# Patient Record
Sex: Female | Born: 1945 | Hispanic: No | Marital: Married | Smoking: Never smoker
Health system: Southern US, Community
[De-identification: ages and names within clinical notes are randomized; demographics above are authoritative.]

## PROBLEM LIST (undated history)

## (undated) DIAGNOSIS — I10 Essential (primary) hypertension: Secondary | ICD-10-CM

## (undated) DIAGNOSIS — I4891 Unspecified atrial fibrillation: Secondary | ICD-10-CM

## (undated) DIAGNOSIS — K219 Gastro-esophageal reflux disease without esophagitis: Secondary | ICD-10-CM

## (undated) DIAGNOSIS — E7800 Pure hypercholesterolemia, unspecified: Secondary | ICD-10-CM

## (undated) DIAGNOSIS — I509 Heart failure, unspecified: Secondary | ICD-10-CM

## (undated) DIAGNOSIS — Z950 Presence of cardiac pacemaker: Secondary | ICD-10-CM

## (undated) DIAGNOSIS — E119 Type 2 diabetes mellitus without complications: Secondary | ICD-10-CM

## (undated) DIAGNOSIS — G8929 Other chronic pain: Secondary | ICD-10-CM

## (undated) DIAGNOSIS — E039 Hypothyroidism, unspecified: Secondary | ICD-10-CM

## (undated) DIAGNOSIS — I2510 Atherosclerotic heart disease of native coronary artery without angina pectoris: Secondary | ICD-10-CM

## (undated) HISTORY — DX: Heart failure, unspecified: I50.9

## (undated) HISTORY — PX: INSERT / REPLACE / REMOVE PACEMAKER: SUR710

## (undated) HISTORY — PX: BACK SURGERY: SHX140

## (undated) HISTORY — PX: CORONARY STENT PLACEMENT: SHX1402

---

## 1997-12-04 ENCOUNTER — Inpatient Hospital Stay (HOSPITAL_COMMUNITY): Admission: AD | Admit: 1997-12-04 | Discharge: 1997-12-09 | Payer: Self-pay | Admitting: Neurosurgery

## 1998-10-08 ENCOUNTER — Encounter: Payer: Self-pay | Admitting: Neurosurgery

## 1998-10-08 ENCOUNTER — Inpatient Hospital Stay (HOSPITAL_COMMUNITY): Admission: AD | Admit: 1998-10-08 | Discharge: 1998-10-10 | Payer: Self-pay | Admitting: Neurosurgery

## 2004-03-03 ENCOUNTER — Inpatient Hospital Stay: Payer: Self-pay | Admitting: Internal Medicine

## 2004-03-03 ENCOUNTER — Other Ambulatory Visit: Payer: Self-pay

## 2004-03-05 ENCOUNTER — Other Ambulatory Visit: Payer: Self-pay

## 2004-03-06 ENCOUNTER — Other Ambulatory Visit: Payer: Self-pay

## 2004-06-10 ENCOUNTER — Emergency Department: Payer: Self-pay | Admitting: Emergency Medicine

## 2004-10-14 ENCOUNTER — Emergency Department: Payer: Self-pay | Admitting: Emergency Medicine

## 2004-10-14 ENCOUNTER — Other Ambulatory Visit: Payer: Self-pay

## 2005-01-06 ENCOUNTER — Ambulatory Visit: Payer: Self-pay

## 2005-01-13 ENCOUNTER — Ambulatory Visit: Payer: Self-pay | Admitting: Family Medicine

## 2005-03-08 ENCOUNTER — Ambulatory Visit: Payer: Self-pay | Admitting: Internal Medicine

## 2005-03-12 ENCOUNTER — Ambulatory Visit: Payer: Self-pay | Admitting: Internal Medicine

## 2005-04-21 ENCOUNTER — Encounter: Payer: Self-pay | Admitting: Internal Medicine

## 2006-07-10 ENCOUNTER — Emergency Department: Payer: Self-pay | Admitting: Emergency Medicine

## 2007-01-13 ENCOUNTER — Ambulatory Visit: Payer: Self-pay

## 2007-07-10 ENCOUNTER — Ambulatory Visit: Payer: Self-pay | Admitting: Unknown Physician Specialty

## 2007-07-11 ENCOUNTER — Ambulatory Visit: Payer: Self-pay | Admitting: Unknown Physician Specialty

## 2007-10-18 ENCOUNTER — Ambulatory Visit: Payer: Self-pay | Admitting: Unknown Physician Specialty

## 2008-07-05 ENCOUNTER — Ambulatory Visit: Payer: Self-pay | Admitting: Unknown Physician Specialty

## 2008-09-26 ENCOUNTER — Ambulatory Visit: Payer: Self-pay | Admitting: Unknown Physician Specialty

## 2009-04-28 ENCOUNTER — Inpatient Hospital Stay: Payer: Self-pay | Admitting: Internal Medicine

## 2009-12-08 ENCOUNTER — Ambulatory Visit: Payer: Self-pay | Admitting: Ophthalmology

## 2010-03-02 ENCOUNTER — Ambulatory Visit: Payer: Self-pay | Admitting: Unknown Physician Specialty

## 2010-03-04 ENCOUNTER — Ambulatory Visit: Payer: Self-pay | Admitting: Ophthalmology

## 2010-12-05 ENCOUNTER — Emergency Department: Payer: Self-pay | Admitting: Emergency Medicine

## 2010-12-18 ENCOUNTER — Other Ambulatory Visit: Payer: Self-pay | Admitting: Internal Medicine

## 2010-12-21 ENCOUNTER — Ambulatory Visit: Payer: Self-pay | Admitting: Internal Medicine

## 2011-06-08 ENCOUNTER — Ambulatory Visit: Payer: Self-pay | Admitting: Unknown Physician Specialty

## 2012-06-12 ENCOUNTER — Ambulatory Visit: Payer: Self-pay | Admitting: Unknown Physician Specialty

## 2013-06-14 ENCOUNTER — Ambulatory Visit: Payer: Self-pay | Admitting: Physician Assistant

## 2013-08-06 ENCOUNTER — Observation Stay: Payer: Self-pay | Admitting: Internal Medicine

## 2013-08-06 LAB — CBC
HCT: 39.8 % (ref 35.0–47.0)
HGB: 13.1 g/dL (ref 12.0–16.0)
MCH: 28.7 pg (ref 26.0–34.0)
MCHC: 32.9 g/dL (ref 32.0–36.0)
MCV: 87 fL (ref 80–100)
Platelet: 323 10*3/uL (ref 150–440)
RBC: 4.56 10*6/uL (ref 3.80–5.20)
RDW: 13.7 % (ref 11.5–14.5)
WBC: 6.8 10*3/uL (ref 3.6–11.0)

## 2013-08-06 LAB — COMPREHENSIVE METABOLIC PANEL
Albumin: 3.8 g/dL (ref 3.4–5.0)
Alkaline Phosphatase: 85 U/L
Anion Gap: 8 (ref 7–16)
BUN: 11 mg/dL (ref 7–18)
Bilirubin,Total: 0.6 mg/dL (ref 0.2–1.0)
Calcium, Total: 9.1 mg/dL (ref 8.5–10.1)
Chloride: 102 mmol/L (ref 98–107)
Co2: 24 mmol/L (ref 21–32)
Creatinine: 0.87 mg/dL (ref 0.60–1.30)
EGFR (African American): >60
EGFR (Non-African Amer.): >60
Glucose: 137 mg/dL — ABNORMAL HIGH (ref 65–99)
Osmolality: 270 (ref 275–301)
Potassium: 3.8 mmol/L (ref 3.5–5.1)
SGOT(AST): 9 U/L — ABNORMAL LOW (ref 15–37)
SGPT (ALT): 16 U/L (ref 12–78)
Sodium: 134 mmol/L — ABNORMAL LOW (ref 136–145)
Total Protein: 7.6 g/dL (ref 6.4–8.2)

## 2013-08-06 LAB — CK TOTAL AND CKMB (NOT AT ARMC)
CK, Total: 156 U/L
CK-MB: 3.1 ng/mL (ref 0.5–3.6)

## 2013-08-06 LAB — TROPONIN I
Troponin-I: <0.02 ng/mL
Troponin-I: <0.02 ng/mL
Troponin-I: <0.02 ng/mL

## 2013-08-06 LAB — PRO B NATRIURETIC PEPTIDE: B-Type Natriuretic Peptide: 43 pg/mL (ref 0–125)

## 2013-08-06 LAB — APTT: Activated PTT: 27.9 secs (ref 23.6–35.9)

## 2013-08-07 ENCOUNTER — Ambulatory Visit: Payer: Self-pay | Admitting: Neurology

## 2013-08-07 LAB — BASIC METABOLIC PANEL
Anion Gap: 6 — ABNORMAL LOW (ref 7–16)
BUN: 12 mg/dL (ref 7–18)
Calcium, Total: 8.4 mg/dL — ABNORMAL LOW (ref 8.5–10.1)
Chloride: 106 mmol/L (ref 98–107)
Co2: 24 mmol/L (ref 21–32)
Creatinine: 0.71 mg/dL (ref 0.60–1.30)
EGFR (African American): >60
EGFR (Non-African Amer.): >60
Glucose: 138 mg/dL — ABNORMAL HIGH (ref 65–99)
Osmolality: 274 (ref 275–301)
Potassium: 3.7 mmol/L (ref 3.5–5.1)
Sodium: 136 mmol/L (ref 136–145)

## 2013-08-07 LAB — CBC
HCT: 36.4 % (ref 35.0–47.0)
HGB: 12.2 g/dL (ref 12.0–16.0)
MCH: 29.1 pg (ref 26.0–34.0)
MCHC: 33.5 g/dL (ref 32.0–36.0)
MCV: 87 fL (ref 80–100)
Platelet: 271 10*3/uL (ref 150–440)
RBC: 4.19 10*6/uL (ref 3.80–5.20)
RDW: 13.8 % (ref 11.5–14.5)
WBC: 6.3 10*3/uL (ref 3.6–11.0)

## 2014-05-13 ENCOUNTER — Ambulatory Visit: Payer: Self-pay

## 2014-08-16 ENCOUNTER — Observation Stay
Admit: 2014-08-16 | Disposition: A | Discharge status: Home / Self Care | Payer: Self-pay | Attending: Internal Medicine | Admitting: Internal Medicine

## 2014-08-16 LAB — BASIC METABOLIC PANEL
ANION GAP: 11 (ref 7–16)
BUN: 13 mg/dL
CALCIUM: 9.6 mg/dL
CREATININE: 0.76 mg/dL
Chloride: 103 mmol/L
Co2: 27 mmol/L
Glucose: 117 mg/dL — ABNORMAL HIGH
Potassium: 3.6 mmol/L
SODIUM: 141 mmol/L

## 2014-08-16 LAB — URINALYSIS, COMPLETE
Bacteria: NONE SEEN
Bilirubin,UR: NEGATIVE
Glucose,UR: NEGATIVE mg/dL (ref 0–75)
Ketone: NEGATIVE
Leukocyte Esterase: NEGATIVE
Nitrite: NEGATIVE
PH: 7 (ref 4.5–8.0)
Protein: NEGATIVE
RBC,UR: <1 /HPF (ref 0–5)
Specific Gravity: 1.004 (ref 1.003–1.030)
Squamous Epithelial: <1
WBC UR: <1 /HPF (ref 0–5)

## 2014-08-16 LAB — CBC
HCT: 42.5 % (ref 35.0–47.0)
HGB: 13.9 g/dL (ref 12.0–16.0)
MCH: 30.1 pg (ref 26.0–34.0)
MCHC: 32.6 g/dL (ref 32.0–36.0)
MCV: 92 fL (ref 80–100)
Platelet: 338 10*3/uL (ref 150–440)
RBC: 4.6 10*6/uL (ref 3.80–5.20)
RDW: 13.4 % (ref 11.5–14.5)
WBC: 8.7 10*3/uL (ref 3.6–11.0)

## 2014-08-16 LAB — TROPONIN I
Troponin-I: <0.03 ng/mL
Troponin-I: <0.03 ng/mL

## 2014-08-27 ENCOUNTER — Ambulatory Visit
Admit: 2014-08-27 | Disposition: A | Discharge status: Home / Self Care | Payer: Self-pay | Attending: Physician Assistant | Admitting: Physician Assistant

## 2014-09-07 NOTE — Consult Note (Signed)
PATIENT NAME:  Christine BestANNA, Christine Zimmerman MR#:  [id number] DATE OF BIRTH:  Jan 25, 1946  DATE OF CONSULTATION:  08/06/2013  REFERRING PHYSICIAN:  Emergency Room  CONSULTING PHYSICIAN:  Aubrielle Stroud D. Delight Bickle, MD  REASON FOR CONSULTATION: Referred by the Emergency Room with unstable chest pain.   HISTORY OF PRESENT ILLNESS: The patient is a 69 year old [place] from [place] with a history of coronary artery disease, angioplasty, stenting, diabetes, hypertension, hyperlipidemia, DJD, who recently complained of significant pain in the left arm, shoulder, back off and on for several days. She initially came to my office but was having active chest pain and I was not present, so she was advised to go the Emergency Room for further evaluation and care. EKG was nondiagnostic, but she continued to have significant pain, so she was admitted for further evaluation and care. Pain was 5 out of 10 and relatively persistent over several days.   REVIEW OF SYSTEMS: No blackout spells or syncope. No nausea or vomiting. No fever. No chills. No sweats. No weight loss. No weight gain. No hemoptysis or hematemesis. No bright red blood per rectum. No vision change or hearing change. No sputum production or cough.   PAST MEDICAL HISTORY: Coronary artery disease, DJD, hyperlipidemia, hypertension, diabetes, hypothyroidism, mild depression, anxiety.   PAST SURGICAL HISTORY: Coronary artery bypass surgery, PCI and stent, back surgery.   FAMILY HISTORY: Coronary artery disease, hypertension, diabetes, cancer.   SOCIAL HISTORY: Married. Lives with husband. Denies smoking or alcohol consumption.   MEDICATIONS: She is on Synthroid 50 mcg a day, ranitidine 150 twice a day, Plavix 75 a day, nitroglycerin 0.4 p.r.n., Niaspan once a day, Imdur twice a day, HCTZ 25 a day, Glucophage 1000 twice a day, glipizide 10 mg a day, calcium plus D, atorvastatin 40 mg a day, aspirin 81 a day.   PHYSICAL EXAMINATION: VITAL SIGNS: Blood pressure was  190/80, pulse 70, respiratory rate 16, afebrile.  HEENT: Normocephalic, atraumatic. Pupils equal and reactive to light.  NECK: Supple. No significant JVD, bruits, or adenopathy.  LUNGS: Clear to auscultation and percussion. No significant wheeze, rhonchi, or rale.  HEART: Regular rate and rhythm. Systolic ejection murmur at the  left sternal border. Positive S4.  ABDOMEN: Benign.  EXTREMITIES: Within normal limits.  NEUROLOGIC: Intact.  SKIN: Normal.   LABORATORY, DIAGNOSTIC, AND RADIOLOGICAL DATA: Glucose 137, BNP 43, BUN 11, creatinine 0.87, sodium 134, potassium 3.8, chloride 102, CO2 of 24, calcium 9.1. LFTs negative. White count 6.8, hemoglobin 13, platelet count 323. Chest x-ray negative. EKG: Right bundle branch block, nonspecific ST-T wave changes.   ASSESSMENT: Possible unstable angina, chest pain, coronary artery disease, diabetes, hyperlipidemia, hypertension, anxiety, degenerative joint disease.   PLAN:  1.  Agree with admit. Rule out for myocardial infarction. Consider cardiac cath for unstable anginal symptoms. Anticoagulation, aspirin, beta blockers. Also recommend nitrates. Would consider Ranexa. Will base recommendations on the cath.  2.  Continue hypertension control.  3.  Continue diabetes management and control.  4.  Continue statin therapy.  5.  Continue DJD therapy.  6.  Consider reflux therapy.   ____________________________ Bobbie Stackwayne D. Juliann Paresallwood, MD [email or web address] D: 08/07/2013 13:14:47 ET T: 08/07/2013 13:36:34 ET JOB#: [id number]  cc: Annaliz Aven D. Juliann Paresallwood, MD, <Dictator> Alwyn PeaWAYNE D Delecia Vastine MD ELECTRONICALLY SIGNED 08/28/2013 12:39

## 2014-09-07 NOTE — Discharge Summary (Signed)
PATIENT NAME:  Christine Zimmerman, Christine Zimmerman MR#:  [id number] DATE OF BIRTH:  1945-07-01  DATE OF ADMISSION:  08/06/2013 DATE OF DISCHARGE:  08/08/2013  FINAL DIAGNOSES: 1.  Left neck, shoulder, arm discomfort, likely due to radiculopathy from cervical disk disease.  2.  Lumbar degenerative disk disease with sciatica, likely source of left leg numbness.  3.  Coronary artery disease with prior stenting.  4.  Hyperlipidemia.  5.  Hypertension.  6.  Onset diabetes mellitus.  7.  Hypothyroidism.  PRINCIPAL PROCEDURE:  Cardiac catheterization. This revealed intact stents without significant flow-limiting disease. The ejection fraction was preserved.   HISTORY AND PHYSICAL: Please see dictated admission history and physical.  HOSPITAL COURSE: The patient was admitted with discomfort and pain in the left neck, chest, arm, but also with numbness in the left hand. This appeared to be positional and her story appeared to be consistent with cervical disk disease. She also complained of some numbness in the left foot. On further addressing with her, this had been a longer standing problem and these 2 issues appeared to be separate. She underwent MRI of the brain which revealed no evidence of stroke. Carotid Dopplers were performed, which revealed no evidence of flow-limiting disease. Cervical spine films were performed, which revealed diffuse degenerative disk disease. Neurology consultation was obtained. The patient appeared to have chronic findings related to sciatic nerve impingement within the leg, as well as decrease strength within the triceps and changes in the reflexes, which would suggest a portion of subacute cervical disk disease, nerve impingement as well.   Because of history of coronary artery disease and the continued discomfort in the chest, some of which the patient stated were similar to the pain she had prior stenting, cardiology consultation was obtained, and catheterization was originally planned for  03/24. Because of the Emergency Room schedule, however, this could not be performed. She was placed on prednisone taper, muscle relaxants and heat was used. This showed some improvement in her symptoms, though they did not resolve. To further investigate, she did undergo cardiac catheterization on 08/08/2013, with the results as noted above.  At this point it is felt that these symptoms are related to a cervical disk disease. We had recommended physical therapy which she did not yet want to set up. The muscle relaxers did not provide much relief, and made her a little bit sleepy, so these will be held. She is tolerating the prednisone taper and she is aware of potential for rising blood sugars in relation to this medication, though this was not observed to be a significant problem during her stay in the hospital.  At this time, she will be discharged home in stable condition with physical activity up as tolerated. She will follow up with Dr. Dorothyann Peng in 1 to 2 weeks and follow up with Lora Paula, PA at [facility], in the next 1 to 2 weeks as well. She should follow a 2 gram sodium, carbohydrate controlled diet. She should check her sugars daily and record this. Red flag symptoms were discussed with the patient. Care for the wound site in the groin was discussed with the patient by nursing.   DISCHARGE MEDICATIONS: 1.  Atorvastatin 40 mg p.o. at bedtime.  2.  Ranitidine 150 mg p.o. b.i.d.  3.  HCTZ 25 mg p.o. daily with Synthroid 0.05 mg p.o. daily.  4.  Glucophage 1000 mg p.o. b.i.d. 5.  Calcium plus D 600 mg p.o. daily.  6.  Aspirin 81  mg p.o. daily. 7.  Plavix 75 mg p.o. daily.  8.  Niaspan ER 500 mg p.o. daily.  9.  Isosorbide mononitrate 30 mg p.o. b.i.d.  10.  Nitroglycerin 0.4 mg sublingually q. 5 minutes as needed for chest pain; max 3 times and in 24 hours.  11.  Glipizide 10 mg p.o. daily. 12.  Prednisone taper starting at 30 mg daily, decreasing x 10 mg  every 2 days.   ____________________________ Lynnea FerrierBert J. Murriel Eidem III, MD [email or web address] D: 08/08/2013 12:55:27 ET T: 08/08/2013 13:08:29 ET JOB#: [id number]  cc: Lynnea FerrierBert J. Marlyce Mcdougald III, MD, <Dictator> Dwayne D. Juliann Paresallwood, MD Maurine MinisterMiriam McLaughlin, PA-C Daniel NonesBERT Vernita Tague MD ELECTRONICALLY SIGNED 08/09/2013 8:03

## 2014-09-07 NOTE — Consult Note (Signed)
Chief Complaint:  Subjective/Chief Complaint Pre-op for cath today. Still having some cp. She was unable to have cath yesterday because of the full schedule.   VITAL SIGNS/ANCILLARY NOTES: **Vital Signs.:   25-Mar-15 07:39  Pulse Pulse 55  Respirations Respirations 18  Systolic BP Systolic BP 729  Diastolic BP (mmHg) Diastolic BP (mmHg) 77  Mean BP 101  Pulse Ox % Pulse Ox % 97  Pulse Ox Activity Level  At rest  Oxygen Delivery Room Air/ 21 %  *Intake and Output.:   Shift 25-Mar-15 07:00  Grand Totals Intake:   Output:  400    Net:  -400 24 Hr.:  -1900  Urine ml     Out:  400  Length of Stay Totals Intake:  1049 Output:  2850    Net:  -1801   Brief Assessment:  GEN well developed, well nourished, no acute distress   Cardiac Regular   Respiratory normal resp effort  clear BS   Gastrointestinal Normal   Gastrointestinal details normal Soft  Nontender  Nondistended   EXTR negative cyanosis/clubbing, negative edema   Lab Results: Cardiac Catherization:  25-Mar-15 09:25   Cardiac Catheterization  [facility] [place], [place] [address] [phone]   Cardiovascular Catheterization Comprehensive Report   Patient: Christine Zimmerman Study date: 08/08/2013 MR number: [id number] Account number: [id number]   DOB: 12-11-1945 Age: 69 years Gender: Female Race: Oriental Height: 61.8 in Weight: 159.7 lb   Diagnostic Cardiologist:  Lujean Amel, MD   SUMMARY:   -Summary: Normal LVF EF=55% Constance Holster; Wall Motion Cors [place] ok LAD 60-70% diffuse 50% distal Circ 25-30% diffuse stents patent RCA 25% stent patent IMP' Mild/Mod CAD Rec Medical therapy She may need possible intervention of prox LAD   CORONARY CIRCULATION: The coronary circulation is right dominant. Proximal LAD: There was a diffuse 60 % stenosis. Mid LAD: There was a 50 % stenosis. Proximal circumflex: There was a diffuse 25 % stenosis at the site of a prior stent. Mid  circumflex: There was a diffuse 25 % stenosis at the site of a prior stent. Proximal RCA: There was a diffuse 25 % stenosis at the site of a prior stent.   VENTRICLES: There were no left ventricular global or regional wall motion abnormalities. The left ventricle was normal in size.   VALVES: AORTIC VALVE: The aortic valve was evaluated by left ventriculography. The aortic valve appeared to be structurally normal. The aortic valve leaflets exhibited normal thickness and normal excursion. There was no aortic stenosis. MITRAL VALVE: The mitral valve was evaluated by left ventriculography. The mitral valve appeared grossly normal. The mitral leaflets exhibited normal thickness and normal excursion. The mitral valve exhibited no regurgitation.   INDICATIONS: Angina/MI: atypical chest pain.   HISTORY: No history of previous myocardial infarction. There was no prior diagnosis of congestive heart failure. The patient has hypertension, oral hypoglycemic-treated diabetes, and a family history of coronary artery disease. There was no history of cerebrovascular disease, peripheral arterial disease, or chronic lung disease. PRIOR CARDIOVASCULAR PROCEDURES: No history of valve surgery or coronary bypass surgery.   PRIOR DIAGNOSTIC TEST RESULTS: Nuclear stress test was negative.   PROCEDURES PERFORMED: Left heart catheterization with ventriculography. Procedure: Arterial Occlusive Device Procedure: Successful Closure with Mynx   COMPLICATIONS: No complication occurred during the cath lab visit.   PROCEDURE: The risks and alternatives of the procedures and conscious sedation were explained to the patient and informed consent was obtained. The patient was brought to the  cath lab and placed on the table. The planned puncture sites were prepped and draped in the usual sterile fashion.   -Right femoral artery access. The vessel was accessed, a wire was threaded into the vessel, and a was  advanced over the wire into the vessel.   -Left heart catheterization. A catheter was advanced to the ascending aorta. Ventriculography was performed using power injectionof contrast agent.   -Arterial Occlusive Device.   -Successful Closure with Mynx.   PROCEDURE COMPLETION: TIMING: Test started at 10:02. Test concluded at 10:26. RADIATION EXPOSURE: Fluoroscopy time: 1.85 min. Fluoroscopy dose: 0.503 Gray. MEDICATIONS GIVEN: Midazolam, 1 mg, IV, at 09:58. Fentanyl, 25 mcg, IV, at 10:10. CONTRAST GIVEN: Isovue 90 ml.   Prepared and signed by   Lujean Amel, MD Signed 08/15/2013 14:27:07   STUDY DIAGRAM   Angiographic findings Native coronary lesions:  Proximal LAD: Lesion 1: diffuse, 60 % stenosis.  Mid LAD: Lesion 1: 50 % stenosis. Proximal circumflex: Lesion 1: diffuse, 25 % stenosis, site of prior stent. Mid circumflex: Lesion 1: diffuse, 25 % stenosis, site of prior stent.  Proximal RCA: Lesion 1: diffuse, 25 % stenosis, site of prior stent.   HEMODYNAMIC TABLES   Pressures:  Baseline Pressures:  - HR: 67 Pressures:  - Rhythm: Pressures:  -- Aortic Pressure (S/D/M): 184/71/73 Pressures:  -- Left Ventricle (s/edp): 180/21/--   Outputs:  Baseline Outputs:  -- CALCULATIONS[age] Outputs:  -- CALCULATIONS: Body Surface Area: 1.73 Outputs:  -- CALCULATIONS: Height in cm: 157.00 Outputs:  -- CALCULATIONS: Sex: Female Outputs:  -- CALCULATIONS:Weight in kg: 72.60  Routine Chem:  24-Mar-15 05:12   Glucose, Serum  138  BUN 12  Creatinine (comp) 0.71  Sodium, Serum 136  Potassium, Serum 3.7  Chloride, Serum 106  CO2, Serum 24  Calcium (Total), Serum  8.4  Anion Gap  6  Osmolality (calc) 274  eGFR (African American) >60  eGFR (Non-African American) >60 (eGFR values <41m/min/1.73 m2 may be an indication of chronic kidney disease (CKD). Calculated eGFR is useful in patients with stable renal function. The eGFR calculation will not be reliable  in acutely ill patients when serum creatinine is changing rapidly. It is not useful in  patients on dialysis. The eGFR calculation may not be applicable to patients at the low and high extremes of body sizes, pregnant women, and vegetarians.)  Routine Hem:  24-Mar-15 05:12   WBC (CBC) 6.3  RBC (CBC) 4.19  Hemoglobin (CBC) 12.2  Hematocrit (CBC) 36.4  Platelet Count (CBC) 271 (Result(s) reported on 07 Aug 2013 at 05:36AM.)  MCV 87  MCH 29.1  MCHC 33.5  RDW 13.8   Radiology Results: XRay:    23-Mar-15 09:08, Chest PA and Lateral  Chest PA and Lateral   REASON FOR EXAM:    Chest Pain  COMMENTS:       PROCEDURE: DXR - DXR CHEST PA (OR AP) AND LATERAL  - Aug 06 2013  9:08AM     CLINICAL DATA:  Chest pain    EXAM:  CHEST  2 VIEW    COMPARISON:  DG CHEST 2V dated 12/05/2010    FINDINGS:  The heart size and mediastinal contours are within normal limits.  Both lungs are clear. The visualized skeletal structures are  unremarkable.     IMPRESSION:  No active cardiopulmonary disease.      Electronically Signed    By: HMargaree MackintoshM.D.    On: 08/06/2013 09:17  Verified By: Mikki Santee, M.D., MD    24-Mar-15 10:02, Cervical Spine AP and Lateral  Cervical Spine AP and Lateral   REASON FOR EXAM:    neck pain  COMMENTS:       PROCEDURE: DXR - DXR C- SPINE AP AND LATERAL  - Aug 07 2013 10:02AM     CLINICAL DATA:  Neck pain.    EXAM:  CERVICAL SPINE - 2-3 VIEW    COMPARISON:  MRI scan of July 10, 2007.    FINDINGS:  No fracture or spondylolisthesis is noted. Mild degenerative disc  disease is noted at C5-6 and C6-7. Mild anterior osteophyte  formation is noted at C3-4, C4-5 and C5-6. Posterior facet joints  appear normal.     IMPRESSION:  Multilevel degenerative disc disease. No acute abnormality seen in  the cervical spine.      Electronically Signed    By: Sabino Dick M.D.    On: 08/07/2013 12:17         Verified By: Marveen Reeks,  M.D.,  [place]:    23-Mar-15 14:21, US Carotid Doppler Bilateral  US Carotid Doppler Bilateral   REASON FOR EXAM:    TIA  COMMENTS:       PROCEDURE: [place]  - US CAROTID DOPPLER BILATERAL  - Aug 06 2013  2:21PM     CLINICAL DATA:  TIA, history of hypertension, left-sided visual  disturbance, hyperlipidemia, diabetes, history of CAD (post coronary  artery stent placement)    EXAM:  BILATERAL CAROTID DUPLEX ULTRASOUND    TECHNIQUE:  Pearline Cables scale imaging, color Doppler and duplex ultrasound were  performed of bilateral carotid and vertebral arteries in the neck.  COMPARISON:  None.    FINDINGS:  Criteria: Quantification of carotid stenosis is based on velocity  parameters that correlate the residual internal carotid diameter  with NASCET-based stenosis levels, using the diameter of the distal  internal carotid lumen as the denominator for stenosis measurement.    The following velocity measurements were obtained:    RIGHT    ICA:  63/20 cm/sec    CCA:  32/91 cm/sec  SYSTOLIC ICA/CCA RATIO:  9.16    DIASTOLIC ICA/CCA RATIO:  6.06    ECA:  61 cm/sec    LEFT    ICA:  82/29 cm/sec    CCA:  00/45 cm/sec    SYSTOLIC ICA/CCA RATIO:  0.9    DIASTOLIC ICA/CCA RATIO:  9.97  ECA:  89 cm/sec    RIGHT CAROTID ARTERY: There is marked tortuosity of the right common  carotid artery (image 5). There is a minimal amount of eccentric  intimal wall thickening within the mid and distal aspects of the  right common carotid arteries (representative images 7 and 10  respectively). There is minimal intimal thickening and eccentric  mixed echogenic plaque within the right carotid bulb (images 13 and  34). There is a minimal amount of eccentric mixed echogenic plaque  involving the right internal carotid artery (images 19, 22 and 26),  not resulting in elevated peak systolic velocities within the right  internal carotid artery to suggest a hemodynamically significant  stenosis.    RIGHT  VERTEBRAL ARTERY:  Antegrade flow  LEFT CAROTID ARTERY: There is a minimal amount of intimal thickening  within the distal aspect of the left common carotid artery (image  45). The left internal carotid artery is noted to be tortuous (image  58). There is a minimal amount of eccentric mixed echogenic plaque  within  the left carotid bulb (images 48 and 69) extending to involve  the origin proximal aspect of the left internal carotid artery  (image 54) not resulting inelevated peak systolic velocities within  the interrogated course of the left internal carotid artery.    LEFT VERTEBRAL ARTERY:  Antegrade flow     IMPRESSION:  Minimal amount of bilateral atherosclerotic plaque, right  subjectively greater than left, not resulting in a hemodynamically  significant stenosis.  Electronically Signed    By: Sandi Mariscal M.D.    On: 08/06/2013 14:26         Verified By: Aileen Fass, M.D.,  Cardiac Catherization:    25-Mar-15 09:25, Cardiac Catheterization  Cardiac Catheterization   [facility]  [place], [place] [address]  [phone]     Cardiovascular Catheterization Comprehensive Report     Patient: ELEONORA PEELER  Study date: 08/08/2013  MR number: [id number]  Account number: [id number]     DOB: [birth year]  Age: 73 years  Gender: Female  Race: Oriental  Height: 61.8 in  Weight: 159.7 lb     Diagnostic Cardiologist:  Lujean Amel, MD     SUMMARY:     -Summary: Normal LVF  EF=55%  Constance Holster; Wall Motion  Cors  [place] ok  LAD 60-70% diffuse 50% distal  Circ 25-30% diffuse stents patent  RCA 25% stent patent  IMP'  Mild/Mod CAD  Rec Medical therapy  She may need possible intervention of prox LAD     CORONARY CIRCULATION: The coronary circulation is right dominant.  Proximal LAD: There was a diffuse 60 % stenosis. Mid LAD: There was a  50 % stenosis. Proximal circumflex: There was a diffuse 25 % stenosis  at the site of a prior stent. Mid  circumflex: There was a diffuse 25  % stenosis at the site of a prior stent. Proximal RCA: There was a  diffuse 25 % stenosis at the site of a prior stent.     VENTRICLES: There were no left ventricular global or regional wall  motion abnormalities. The left ventricle was normal in size.     VALVES: AORTIC VALVE: The aortic valve was evaluated by left  ventriculography. The aortic valve appeared to be structurally  normal. The aortic valve leaflets exhibited normal thickness and  normal excursion. There was no aortic stenosis. MITRAL VALVE: The  mitral valve was evaluated by left ventriculography. The mitral valve  appeared grossly normal. The mitral leaflets exhibited normal  thickness and normal excursion. The mitral valve exhibited no  regurgitation.     INDICATIONS: Angina/MI: atypical chest pain.     HISTORY: No history of previous myocardial infarction. There was no  prior diagnosis of congestive heart failure. The patient has  hypertension, oral hypoglycemic-treated diabetes, and a family  history of coronary artery disease. There was no history of  cerebrovascular disease, peripheral arterial disease, or chronic lung  disease. PRIOR CARDIOVASCULAR PROCEDURES: No history of valve surgery  or coronary bypass surgery.     PRIOR DIAGNOSTIC TEST RESULTS: Nuclear stress test was negative.     PROCEDURES PERFORMED: Left heart catheterization with  ventriculography. Procedure: Arterial Occlusive Device Procedure:  Successful Closure with Mynx     COMPLICATIONS: No complication occurred during the cath lab visit.     PROCEDURE: The risks and alternatives of the procedures and conscious  sedation were explained to the patient and informed consent was  obtained. The patient was brought to the cath lab and  placed on the  table. The planned puncture sites were prepped and draped in the  usual sterile fashion.     -Right femoral artery access. The vessel was accessed, a wire  was  threaded into the vessel, and a was advanced over the wire into the  vessel.     -Left heart catheterization. A catheter was advanced to the ascending  aorta. Ventriculography was performed using power injectionof  contrast agent.     -Arterial Occlusive Device.     -Successful Closure with Mynx.     PROCEDURE COMPLETION: TIMING: Test started at 10:02. Test concluded at  10:26. RADIATION EXPOSURE: Fluoroscopy time: 1.85 min. Fluoroscopy  dose: 0.503 Gray.  MEDICATIONS GIVEN: Midazolam, 1 mg, IV, at 09:58. Fentanyl, 25 mcg,  IV, at 10:10.  CONTRAST GIVEN: Isovue 90 ml.     Prepared and signed by     Lujean Amel, MD  Signed 08/15/2013 14:27:07     STUDY DIAGRAM     Angiographic findings  Native coronary lesions:   Proximal LAD: Lesion 1: diffuse, 60 % stenosis.   Mid LAD: Lesion 1: 50 % stenosis.  Proximal circumflex: Lesion 1: diffuse, 25 % stenosis, site of prior  stent.  Mid circumflex: Lesion 1: diffuse, 25 % stenosis, site of prior  stent.   Proximal RCA: Lesion 1: diffuse, 25 % stenosis, site of prior stent.     HEMODYNAMIC TABLES     Pressures:  Baseline  Pressures:  - HR: 67  Pressures:  - Rhythm:  Pressures:  -- Aortic Pressure (S/D/M): 184/71/73  Pressures:  -- Left Ventricle (s/edp): 180/21/--     Outputs:  Baseline  Outputs:  -- CALCULATIONS[age]  Outputs:  -- CALCULATIONS: Body Surface Area: 1.73  Outputs:  -- CALCULATIONS: Height in cm: 157.00  Outputs:  -- CALCULATIONS: Sex: Female  Outputs:  -- CALCULATIONS:Weight in kg: 72.60  MRI:    23-Mar-15 16:46, MRI Brain Without Contrast  MRI Brain Without Contrast   REASON FOR EXAM:    numbness in arm? TIA?  COMMENTS:       PROCEDURE: MR  - MR BRAIN WO CONTRAST  - Aug 06 2013  4:46PM     CLINICAL DATA:  Headache with left jaw numbness and left arm  numbness.    EXAM:  MRI HEAD WITHOUT CONTRAST    TECHNIQUE:  Multiplanar, multiecho pulse sequences of the brain and  surrounding  structures were obtained without intravenous contrast.  COMPARISON:  US CAROTID DUPLEX BILAT dated 08/06/2013; MR HEAD WO/W  CM dated 07/05/2008    FINDINGS:  No evidence for acute infarction, hemorrhage, mass lesion,  hydrocephalus, or extra-axial fluid. Normal for age cerebral volume.  No white matter disease. Flow voids are maintained throughout the  carotid, basilar, and vertebral arteries. There are no areas of  chronic hemorrhage. Pituitary, pineal, and cerebellar tonsils  unremarkable. No upper cervical lesions. Visualized calvarium, skull  base, and upper cervical osseous structures unremarkable. Scalp and  extracranial soft tissues, orbits, sinuses, and mastoids show no  acute process. Compared with prior MR, a similar appearance is  noted.   IMPRESSION:  Unremarkable MRI brain.  No acute intracranial findings.      Electronically Signed    By: Rolla Flatten M.D.    On: 08/06/2013 17:04         Verified By: Staci Righter, M.D.,  Cardiology:    23-Mar-15 08:38, ECG  Ventricular Rate 63  Atrial Rate 63  P-R  Interval 152  QRS Duration 130  QT 442  QTc 452  P Axis 37  R Axis 50  T Axis 9  ECG interpretation   Normal sinus rhythm  Non-specific intra-ventricular conduction block  Nonspecific T wave abnormality  Abnormal ECG  When compared with ECG of 05-Dec-2010 16:25,  QRS duration has increased  Inverted T waves have replaced nonspecific T wave abnormality in Anterior leads  ----------unconfirmed----------  Confirmed by OVERREAD, NOT ([id number]), editor PEARSON, BARBARA ([id number]) on 08/07/2013 2:51:05 PM  ECG    Assessment/Plan:  Assessment/Plan:  Assessment IMP [place] Angina CAD DM HTN Hyperlipidemia GERD Abn EKG .   Plan PLAN Tele which in house to look for arrthymias NTG paste/ SL NTG prn ASA po daily 81 mg Continue Bp control Agree with DM control Cardiac cath today Lipid therapy If cath ok will d/c home today   Electronic  Signatures: Yolonda Kida (MD)  (Signed 12-May-15 10:57)  Authored: Chief Complaint, VITAL SIGNS/ANCILLARY NOTES, Brief Assessment, Lab Results, Radiology Results, Assessment/Plan   Last Updated: 12-May-15 10:57 by Yolonda Kida (MD)

## 2014-09-07 NOTE — Consult Note (Signed)
Referring Physician:  Vaughan Basta :   Primary Care Physician:  Vaughan Basta [facility], [address], [place], [place] [address], [place] [phone]  Reason for Consult: Admit Date: 06-Aug-2013  Chief Complaint: "My left leg sometimes wakes me up at night"  Reason for Consult: left arm and leg numbness   History of Present Illness: History of Present Illness:   Ms. Christine Zimmerman is a 69 yo right-handed woman with PMH notable for CAD s/p multiple stents, lumbar discectomy, HTN, and DM who presented to [facility] for evaluation of chest discomfort and left arm numbness. She relates several episodes of left arm numbness and tingling over the past few months, though these can occur independently of episodes of chest discomfort. More concerning to her from a neurological standpoint is intermittent left leg numbness and shooting pain accompanied by prickling parasthesias. These tend to occur more at night, sometimes waking her from sleep, though can also happen at other times and with various body positions and activities. The pain begins in her posterolateral thigh and radiates down her leg into her foot. It is sometimes relieved by turning over in bed or changing positions. She denies any bowel or bladder changes.  ROS:  General pain   HEENT no complaints   Lungs no complaints   Cardiac chest pain   GI no complaints   GU no complaints   Musculoskeletal no complaints   Extremities no complaints   Skin no complaints   Neuro numbness/tingling   Endocrine no complaints   Psych no complaints   Past Medical/Surgical Hx:  Hypercholesterolemia:   cva:   Herniated Disc:   Hypercholesterolemia:   Cardiac Disease:   HTN:   Diabetes:   Back Surgery:   Cardiac Stent X 5:   Home Medications: Medication Instructions Last Modified Date/Time  nitroglycerin 0.4 mg sublingual tablet 1 tab(s) sublingual every 5 minutes, As Needed - for Chest Pain 23-Mar-15 10:04   glipiZIDE 10 mg oral tablet 1 tab(s) orally once a day 23-Mar-15 10:04  Niaspan ER tablet, extended release 500 mg 1 tab(s) orally once a day (at bedtime) x 30 days  23-Mar-15 10:04  isosorbide mononitrate tablet, extended release 30 mg 1 tab(s)  2 times a day (with meals) 23-Mar-15 10:04  Plavix tablet 75 mg 1 tab(s) orally once a day x 30 days  23-Mar-15 10:04  aspirin tablet 81 mg 1 tab(s) orally once a day x 30 days  23-Mar-15 10:04  calcium-vitamin D tablet 600 mg-200 units 1 tab(s) orally once a day x 30 days  23-Mar-15 10:04  Glucophage tablet 1000 mg 1 tab(s) orally 2 times a day x 30 days  23-Mar-15 10:04  Synthroid tablet 50 mcg (0.05 mg) 1 tab(s) orally once a day x 30 days  23-Mar-15 10:04  hydrochlorothiazide tablet 25 mg 1 tab(s) orally once a day x 30 days  23-Mar-15 10:04  ranitidine capsule 150 mg 1 cap(s) orally 2 times a day x 30 days  23-Mar-15 10:04  atorvastatin tablet 40 mg 1 tab(s) orally once a day (at bedtime) x 30 days  23-Mar-15 10:04   [facility] Current Meds:  Sodium Chloride 0.9%, 1000 ml at 100 ml/hr  HePARin injection, 5000 unit(s), Subcutaneous, q8h  Indication: Anticoagulant, Monitor Anticoags per hospital protocol  Insulin SS -Novolog injection, Subcutaneous, FSBS before meals and at bedtime  0 units if FSBS 0-150     2 unit(s) if FSBS 151 - 200     4 unit(s) if FSBS 201 -  250     6 unit(s) if FSBS 251 - 300     8 unit(s) if FSBS 301 - 350     10 unit(s) if FSBS 351 - 400  Call MD if FSBS is greater than 400, [Waste Code: Black]  Aspirin Chewable, 81 mg Oral daily  - Indication: Pain/Fever/Thromboembolic Disorders/Post MI/Prophylaxis MI  glipiZIDE tablet, ( Glucotrol)  10 mg Oral ac/break  - Indication: Diabetes  Instructions:  Give 30 minutes before breakfast  atorvaSTATin tablet, 40 mg Oral daily  - Indication: Hypercholesterolemia  Clopidogrel tablet, 75 mg Oral daily  Instructions:  Initiate Bleeding Precautions Protocol  Hydrochlorothiazide  tablet, ( Esidrix)  25 mg Oral daily  - Indication: Edema/ Hypertension/ Diuresis/ CHF  metFORMIN tablet, ( Glucophage)  1000 mg Oral bid/wm  - Indication: Diabetes  Instructions:  Give with meals  Niacin CR tablet, ( Niaspan CR)  500 mg Oral at bedtime  - Indication: Hyperlipidemia/ Vitamin Supplement  Instructions:  DO NOT CRUSH  Ranitidine tablet, ( ZanTAC)  150 mg Oral q12h  - Indication: Hyperacidity  Calcium Carb 541m-Vit D 200unit tablet, 1 tablet(s) Oral daily with meal  Instructions:  Contains (5029melemental Calcium) in 125066malcium Carbonate + 200m37mtamin D  Levothyroxine tablet, ( Synthroid)  0.05 mg Oral q6am  - Indication: Thyroid Hormone Replacement  Nursing Saline Flush, 3 to 6 ml, IV push, Q1M PRN for IV Maintenance  Give A.M. Meds the day of Cardiac Cath, Administer Scheduled A.M Meds.  This includes Aspirin, Beta Blockers, and Ace Inhibitors if applicable.  If the patient is a diabetic, ask the physician for morning dose.  Influenza Virus Quadrivalent Vaccine injection, 0.5 ml, Intramuscular, GivenOnce  Indication: provide Active Immunity to Influenza Strains contained in Vaccine, ***The patient must have a temperature of 100.5 or less, anything greater the patient needs to be afebrile x 24 hours before administration***, **Latex Free**  Allergies:  No Known Allergies:   Social/Family History: Lives With: spouse; children  Social History: Denies tobacco, alcohol, or illicit drug use   Vital Signs: **Vital Signs.:   24-Mar-15 07:45  Vital Signs Type Routine  Temperature Temperature (F) 97.7  Celsius 36.5  Temperature Source oral  Pulse Pulse 53  Respirations Respirations 16  Systolic BP Systolic BP 145 518astolic BP (mmHg) Diastolic BP (mmHg) 80  Mean BP 101  Pulse Ox % Pulse Ox % 97  Pulse Ox Activity Level  At rest  Oxygen Delivery Room Air/ 21 %   EXAM: Well-developed, well-nourished, in NAD. No conjunctival injection or scleral edema.  Oropharynx clear. No carotid bruits auscultated. Normal S1, S2 and regular cardiac rhythm on exam. Lungs clear to auscultation bilaterally. Abdomen soft and nontender. Peripheral pulses palpated. No clubbing, cyanosis, or edema in extremities.  MENTAL STATUS: Alert and oriented to person, place, and time. Language fluent and appropriate. Cognition and memory conversationally intact. CRANIAL NERVES: Visual fields full to confrontation. PERRL. EOMI. Facial sensation intact. Facial muscles full and symmetric. Hearing intact to finger rub. Uvula midline with symmetric palatal elevation. Tongue midline without fasciculations. MOTOR: Normal bulk and tone. Left triceps strength 4+/5, otherwise strength 5/5 in deltoids, biceps, triceps, wrist flexors and extensors, and hand intrinsics bilaterally. Strength 5/5 in iliopsoas, glutes, hamstrings, quads, and tib ant bilaterally. Foot eversion and inversion is normal bilaterally. REFLEXES: Absent left triceps and left achilles reflexes. Otherwise 2+ in biceps, triceps, patella, and achilles bilaterally. Flexor plantar responses bilaterally. SENSORY: Moderately diminished to pinprick in the posterior left arm as  well as lateral thigh and leg. Otherwise intact to pinprick throughout without extinction to double simultaneous stimulation. COORDINATION: No ataxia or dysmetria on finger-nose or heel-shin maneuvers. GAIT: Not tested.  Lab Results: Routine Chem:  23-Mar-15 08:46   Glucose, Serum  137  BUN 11  Creatinine (comp) 0.87  Sodium, Serum  134  Potassium, Serum 3.8  Chloride, Serum 102  CO2, Serum 24  Calcium (Total), Serum 9.1  Anion Gap 8  Osmolality (calc) 270  eGFR (African American) >60  eGFR (Non-African American) >60 (eGFR values <60m/min/1.73 m2 may be an indication of chronic kidney disease (CKD). Calculated eGFR is useful in patients with stable renal function. The eGFR calculation will not be reliable in acutely ill patients when serum  creatinine is changing rapidly. It is not useful in  patients on dialysis. The eGFR calculation may not be applicable to patients at the low and high extremes of body sizes, pregnant women, and vegetarians.)  B-Type Natriuretic Peptide ([facility]) 43 (Result(s) reported on 06 Aug 2013 at 09:27AM.)  24-Mar-15 05:12   Glucose, Serum  138  BUN 12  Creatinine (comp) 0.71  Sodium, Serum 136  Potassium, Serum 3.7  Chloride, Serum 106  CO2, Serum 24  Calcium (Total), Serum  8.4  Anion Gap  6  Osmolality (calc) 274  eGFR (African American) >60  eGFR (Non-African American) >60 (eGFR values <669mmin/1.73 m2 may be an indication of chronic kidney disease (CKD). Calculated eGFR is useful in patients with stable renal function. The eGFR calculation will not be reliable in acutely ill patients when serum creatinine is changing rapidly. It is not useful in  patients on dialysis. The eGFR calculation may not be applicable to patients at the low and high extremes of body sizes, pregnant women, and vegetarians.)  Cardiac:  23-Mar-15 08:46   Troponin I < 0.02 (0.00-0.05 0.05 ng/mL or less: NEGATIVE  Repeat testing in 3-6 hrs  if clinically indicated. >0.05 ng/mL: POTENTIAL  MYOCARDIAL INJURY. Repeat  testing in 3-6 hrs if  clinically indicated. NOTE: An increase or decrease  of 30% or more on serial  testing suggests a  clinically important change)    14:27   Troponin I < 0.02 (0.00-0.05 0.05 ng/mL or less: NEGATIVE  Repeat testing in 3-6 hrs  if clinically indicated. >0.05 ng/mL: POTENTIAL  MYOCARDIAL INJURY. Repeat  testing in 3-6 hrs if  clinically indicated. NOTE: An increase or decrease  of 30% or more on serial  testing suggests a  clinically important change)    18:45   Troponin I < 0.02 (0.00-0.05 0.05 ng/mL or less: NEGATIVE  Repeat testing in 3-6 hrs  if clinically indicated. >0.05 ng/mL: POTENTIAL  MYOCARDIAL INJURY. Repeat  testing in 3-6 hrs if  clinically  indicated. NOTE: An increase or decrease  of 30% or more on serial  testing suggests a  clinically important change)   Radiology Results: [place]   23-Mar-15 14:21, [place] Doppler Bilateral  [place] Doppler Bilateral   REASON FOR EXAM:    TIA  COMMENTS:       PROCEDURE: [place]- [place] DOPPLER BILATERAL  - Aug 06 2013  2:21PM     CLINICAL DATA:  TIA, history of hypertension, left-sided visual  disturbance, hyperlipidemia, diabetes, history of CAD (post coronary  artery stent placement)    EXAM:  BILATERAL CAROTID DUPLEX ULTRASOUND    TECHNIQUE:  GrPearline Cablescale imaging, color Doppler and duplex ultrasound were  performed of bilateral carotid and vertebral  arteries in the neck.  COMPARISON:  None.    FINDINGS:  Criteria: Quantification of carotid stenosis is based on velocity  parameters that correlate the residual internal carotid diameter  with NASCET-based stenosis levels, using the diameter of the distal  internal carotid lumen as the denominator for stenosis measurement.    The following velocity measurements were obtained:    RIGHT    ICA:  63/20 cm/sec    CCA:  38/10 cm/sec  SYSTOLIC ICA/CCA RATIO:  1.75    DIASTOLIC ICA/CCA RATIO:  1.02    ECA:  61 cm/sec    LEFT    ICA:  82/29 cm/sec    CCA:  58/52 cm/sec    SYSTOLIC ICA/CCA RATIO:  0.9    DIASTOLIC ICA/CCA RATIO:  7.78  ECA:  89 cm/sec    RIGHT CAROTID ARTERY: There is marked tortuosity of the right common  carotid artery (image 5). There is a minimal amount of eccentric  intimal wall thickening within the mid and distal aspects of the  right common carotid arteries (representative images 7 and 10  respectively). There is minimal intimal thickening and eccentric  mixed echogenic plaque within the right carotid bulb (images 13 and  34). There is a minimal amount of eccentric mixed echogenic plaque  involving the right internal carotid artery (images 19, 22 and 26),  not resulting in elevated  peak systolic velocities within the right  internal carotid artery to suggest a hemodynamically significant  stenosis.    RIGHT VERTEBRAL ARTERY:  Antegrade flow  LEFT CAROTID ARTERY: There is a minimal amount of intimal thickening  within the distal aspect of the left common carotid artery (image  45). The left internal carotid artery is noted to be tortuous (image  58). There is a minimal amount of eccentric mixed echogenic plaque  within the left carotid bulb (images 48 and 69) extending to involve  the origin proximal aspect of the left internal carotid artery  (image 54) not resulting inelevated peak systolic velocities within  the interrogated course of the left internal carotid artery.    LEFT VERTEBRAL ARTERY:  Antegrade flow     IMPRESSION:  Minimal amount of bilateral atherosclerotic plaque, right  subjectively greater than left, not resulting in a hemodynamically  significant stenosis.  Electronically Signed    By: Sandi Mariscal M.D.    On: 08/06/2013 14:26         Verified By: Aileen Fass, M.D.,  MRI:    23-Mar-15 16:46, MRI Brain Without Contrast  MRI Brain Without Contrast   REASON FOR EXAM:    numbness in arm? TIA?  COMMENTS:       PROCEDURE: MR  - MR BRAIN WO CONTRAST  - Aug 06 2013  4:46PM     CLINICAL DATA:  Headache with left jaw numbness and left arm  numbness.    EXAM:  MRI HEAD WITHOUT CONTRAST    TECHNIQUE:  Multiplanar, multiecho pulse sequences of the brain and surrounding  structures were obtained without intravenous contrast.  COMPARISON:  US CAROTID DUPLEX BILAT dated 08/06/2013; MR HEAD WO/W  CM dated 07/05/2008    FINDINGS:  No evidence for acute infarction, hemorrhage, mass lesion,  hydrocephalus, or extra-axial fluid. Normal for age cerebral volume.  No white matter disease. Flow voids are maintained throughout the  carotid, basilar, and vertebral arteries. There are no areas of  chronic hemorrhage. Pituitary, pineal, and cerebellar  tonsils  unremarkable. No upper cervical lesions. Visualized calvarium, skull  base, and upper cervical osseous structures unremarkable. Scalp and  extracranial soft tissues, orbits, sinuses, and mastoids show no  acute process. Compared with prior MR, a similar appearance is  noted.   IMPRESSION:  Unremarkable MRI brain.  No acute intracranial findings.      Electronically Signed    By: Rolla Flatten M.D.    On: 08/06/2013 17:04         Verified By: Staci Righter, M.D.,   Impression/Recommendations: Recommendations:   Ms. Reindl is a 69 year old woman with a history of coronary disease and vascular risk factors of HTN and DM who presents with chest discomfort and left arm and leg numbness. Cardiac enzymes and EKG have been negative for acute MI. Her neurologic exam is notable for absent triceps and achilles reflexes on the left, as well as mild triceps weakness and patchy loss to pinprick in the left C7 and sciatic nerve distributions. Brain MRI is negative for acute pathology.  impression is that she is likely suffering from both sciatica as well as left C7 radiculopathy. She expresses that she has had back surgery in the past and would not consider having any spine surgery again in the future. Given that further imaging of her C-spine and L-spine would be most likely done to decide on whether to have surgery, I would recommend derefrring this during this hospitalization and considering in the outpatient setting only if her symptoms worsen. In the meantime, her clinical radiculopathies could be treated with exercise, NSAIDS, and physical therapy as an outpatient. No further neuroimaging at this timeNSAIDs as tolerated for symptomatic relief of painDefer EMG and C/L spine imaging to outpatient setting if symptoms worsenOutpatient physical therapy referral you for the opportunity to participate in Ms. Fata's care. Please call neurology consults with further questions. Mar Daring, MD  Electronic  Signatures: Carmin Richmond (MD)  (Signed 24-Mar-15 10:35)  Authored: REFERRING PHYSICIAN, Primary Care Physician, Consult, History of Present Illness, Review of Systems, PAST MEDICAL/SURGICAL HISTORY, HOME MEDICATIONS, Current Medications, ALLERGIES, Social/Family History, NURSING VITAL SIGNS, Physical Exam-, LAB RESULTS, RADIOLOGY RESULTS, Recommendations   Last Updated: 24-Mar-15 10:35 by Carmin Richmond (MD)

## 2014-09-07 NOTE — H&P (Signed)
PATIENT NAME:  Carl BestANNA, Rebie J MR#:  [id number] DATE OF BIRTH:  1945-09-15  DATE OF ADMISSION:  08/06/2013  [facility]  PRIMARY CARDIOLOGIST: Dr. Juliann Paresallwood   REFERRING EMERGENCY ROOM PHYSICIAN: Dr. Margarita GrizzleWoodruff  CHIEF COMPLAINT: Chest pain and numbness in left side of the body.  HISTORY OF PRESENT ILLNESS: This is a 69 year old female who has a history of coronary artery disease and stent placement by Dr. Juliann Paresallwood in 2005 and last angioplasty was done 5 years ago, as per the patient, and has been regularly following with Dr. Juliann Paresallwood and last cardiac study, echocardiogram or Myoview, was done in October of 2014. Has been taking all medications regularly. For the last few days she started having chest tightness which is on the left side of the body, around 4 to 5 out of 10. She said that the pain was on the left side and she also felt at the same time there is some numbness radiating to her left jaw, left arm and left leg. She took some nitroglycerin and it went away on Friday so she called Dr. Glennis Brinkallwood's office and left message to call her back but did not get any message again. She again had similar pain today and as she was not getting any response back she decided to come to the Emergency Room to get herself checked as she was worried about having a stroke or some cardiac problem. On further questioning, the patient denies any shortness of breath, palpitations, cough or fever. She says that this pain on the chest is different than what she had when she was found having blockages and had stents in 2005. Denies any headache, but she felt a little weak on her left side of the body with this numbness when had those symptoms.   REVIEW OF SYSTEMS: CONSTITUTIONAL: Negative for fever, fatigue, weakness, pain or weight loss.  EYES: No blurring, double vision, discharge or redness.  EARS, NOSE, THROAT: No tinnitus, ear pain or hearing loss.  RESPIRATORY: No cough,  wheezing, hemoptysis, or shortness of breath.  CARDIOVASCULAR: The patient had some chest pressure. No palpitations, arrhythmia or leg edema.  GASTROINTESTINAL: No nausea, vomiting, diarrhea, or abdominal pain. GENITOURINARY: No dysuria, hematuria, or increased frequency.  ENDOCRINE: No increased sweating. No heat or cold intolerance.  SKIN: No rashes.  JOINTS: No swelling or pain.  NEUROLOGIC: Has some numbness on the left side of the body on and off. Also noticed some high blood pressure with that. Denies any tremor or vertigo.  PSYCHIATRIC: Denies any anxiety, insomnia, bipolar disorder.   PAST MEDICAL HISTORY: 1.  Coronary artery disease status post cardiac catheterization December 2005 and angioplasty by Dr. Juliann Paresallwood, stent in RCA. Total 4 stents. 2.  Herniated disk, lower back surgery.  3.  Hyperlipidemia.  4.  Hypertension.  5.  Diabetes.  6.  Hypothyroidism.  FAMILY HISTORY: Early coronary artery disease, hypertension in the patient's father who died at age of 660 with diabetes, some unknown cancer. Her brother also had coronary artery disease.   SOCIAL HISTORY: She is married, lives with her husband and son. No smoking, no alcohol abuse. No disabilities.  HOME MEDICATIONS: 1.  Synthroid 50 mcg once a day.  2.  Ranitidine 150 mg 2 times a day.  3.  Plavix 75 mg once a day.  4.  Nitroglycerin 0.4 mg every 5 minutes for chest pain.  5.  Niaspan extended release tablet once a day.  6.  Isosorbide mononitrate 2 times a day.  7.  Hydrochlorothiazide 25 mg once a day.  8.  Glucophage 1000 mg 2 times a day.  9.  Glipizide 10 mg once a day.  10.  Calcium plus vitamin D once a day.  11.  Atorvastatin 40 mg once a day.  12.  Aspirin 81 mg once a day.   PHYSICAL EXAMINATION: VITAL SIGNS: In the ER, temperature 98.1, pulse 69, respirations 18, blood pressure 192/84, and pulse ox 97% on room air.  GENERAL: The patient is fully alert and oriented to time, place, and person. Does not  appear in any acute distress.  HEENT: Head and neck atraumatic. Conjunctivae pink. Oral mucosa moist.  NECK: Supple. No JVD.  RESPIRATORY: Bilateral clear and equal air entry.  CARDIOVASCULAR: S1 and S2 present, regular. No murmur.  ABDOMEN: Soft, nontender. Bowel sounds present. No organomegaly.  SKIN: No rashes. LEGS: No edema.  NEUROLOGIC: Power 5/5 in all 4 limbs. No gross abnormalities. No tremor.  PSYCHIATRIC: No anxiety, insomnia, bipolar disorder. Does not appear in any acute psychiatric distress.  JOINTS: No swelling or tenderness.   DIAGNOSTIC DATA: Important lab results in the hospital: Glucose 137. BNP 43. BUN 11, creatinine 0.87, sodium 134, potassium 3.8, chloride 102, CO2 24, calcium 9.1. Total protein 7.6, albumin 3.8, bilirubin 0.6, SGOT 9, SGPT 16. CK total 156. Troponin less than 0.02. WBC 6.8, hemoglobin 13.1, platelet count 323, and MCV 87.   Chest x-ray, PA and lateral, is done. No acute cardiopulmonary disease.   EKG shows some right bundle branch block which appears to be new with some T wave inversion in lead V1 to V3.   ASSESSMENT AND PLAN: A 69 year old female who has past medical history of coronary artery disease and stent 5 times, has hypertension, diabetes, and  hypothyroidism, presented with episodes of elevated blood pressure with some chest tightness and numbness on the left side of the body, 2 to 3 times in the last week, which were getting relieved by nitroglycerin.  1.  Chest pain. Will have to rule out acute coronary syndrome. Will monitor on telemetry and follow serial troponins. Cardiology consult called in with Dr. Juliann Pares who is her primary cardiology doctor and he suggested most likely we might proceed with cardiac catheterization to make sure about any new blockages. We will continue her cardiac medication at this time. Currently troponin is negative so there is no need to start on IV anticoagulation drip.  2.  Numbness on the left side of the body,  which was on and off and associated with the cardiac symptoms. Most likely it is a presentation of high blood pressure and cardiac, but because of high risk we would like to get MRI of the brain and carotid Doppler study. As per Dr. Juliann Pares, there was an echocardiogram done almost 6 months ago in office so I would like to avoid it and leave it up to Dr. Juliann Pares to get further cardiac work-up.  3.  Hypertension. Currently blood pressure is under control. We will continue home medication and monitor.  4.  Diabetes. Continue home medication and will do insulin sliding scale coverage.  5.  Hyperlipidemia. Continue statin.   CODE STATUS: FULL.  TOTAL TIME SPENT ON THIS ADMISSION: 50 minutes.   ____________________________ Hope Pigeon Elisabeth Pigeon, MD [id number] D: 08/06/2013 12:00:24 ET T: 08/06/2013 14:38:13 ET JOB#: [id number]  cc: Hope Pigeon. Elisabeth Pigeon, MD, <Dictator> Altamese Dilling MD ELECTRONICALLY SIGNED 08/13/2013 21:59

## 2014-09-07 NOTE — Consult Note (Signed)
Chief Complaint:  Subjective/Chief Complaint Pt doing well today. Unable to have cath done.   VITAL SIGNS/ANCILLARY NOTES: **Vital Signs.:   24-Mar-15 11:11  Vital Signs Type Routine  Temperature Temperature (F) 97.8  Celsius 36.5  Temperature Source oral  Pulse Pulse 55  Respirations Respirations 16  Systolic BP Systolic BP 161  Diastolic BP (mmHg) Diastolic BP (mmHg) 84  Mean BP 109  Pulse Ox % Pulse Ox % 98  Pulse Ox Activity Level  At rest  Oxygen Delivery Room Air/ 21 %  *Intake and Output.:   Shift 24-Mar-15 15:00  Grand Totals Intake:   Output:  600    Net:  -600 24 Hr.:  -600  Urine ml     Out:  600  Length of Stay Totals Intake:  1049 Output:  1550    Net:  -501   Brief Assessment:  GEN well developed, well nourished, no acute distress   Cardiac Regular   Respiratory normal resp effort  clear BS   Gastrointestinal Normal   Gastrointestinal details normal Soft  Nontender  Nondistended   EXTR negative cyanosis/clubbing, negative edema   Lab Results: Routine Chem:  24-Mar-15 05:12   Glucose, Serum  138  BUN 12  Creatinine (comp) 0.71  Sodium, Serum 136  Potassium, Serum 3.7  Chloride, Serum 106  CO2, Serum 24  Calcium (Total), Serum  8.4  Anion Gap  6  Osmolality (calc) 274  eGFR (African American) >60  eGFR (Non-African American) >60 (eGFR values <52m/min/1.73 m2 may be an indication of chronic kidney disease (CKD). Calculated eGFR is useful in patients with stable renal function. The eGFR calculation will not be reliable in acutely ill patients when serum creatinine is changing rapidly. It is not useful in  patients on dialysis. The eGFR calculation may not be applicable to patients at the low and high extremes of body sizes, pregnant women, and vegetarians.)  Routine Hem:  24-Mar-15 05:12   WBC (CBC) 6.3  RBC (CBC) 4.19  Hemoglobin (CBC) 12.2  Hematocrit (CBC) 36.4  Platelet Count (CBC) 271 (Result(s) reported on 07 Aug 2013 at  05:36AM.)  MCV 87  MCH 29.1  MCHC 33.5  RDW 13.8   Radiology Results: XRay:    23-Mar-15 09:08, Chest PA and Lateral  Chest PA and Lateral   REASON FOR EXAM:    Chest Pain  COMMENTS:       PROCEDURE: DXR - DXR CHEST PA (OR AP) AND LATERAL  - Aug 06 2013  9:08AM     CLINICAL DATA:  Chest pain    EXAM:  CHEST  2 VIEW    COMPARISON:  DG CHEST 2V dated 12/05/2010    FINDINGS:  The heart size and mediastinal contours are within normal limits.  Both lungs are clear. The visualized skeletal structures are  unremarkable.     IMPRESSION:  No active cardiopulmonary disease.      Electronically Signed    By: HMargaree MackintoshM.D.    On: 08/06/2013 09:17         Verified By: HMikki Santee M.D., MD    24-Mar-15 10:02, Cervical Spine AP and Lateral  Cervical Spine AP and Lateral   REASON FOR EXAM:    neck pain  COMMENTS:       PROCEDURE: DXR - DXR C- SPINE AP AND LATERAL  - Aug 07 2013 10:02AM     CLINICAL DATA:  Neck pain.    EXAM:  CERVICAL SPINE - 2-3 VIEW  COMPARISON:  MRI scan of July 10, 2007.    FINDINGS:  No fracture or spondylolisthesis is noted. Mild degenerative disc  disease is noted at C5-6 and C6-7. Mild anterior osteophyte  formation is noted at C3-4, C4-5 and C5-6. Posterior facet joints  appear normal.     IMPRESSION:  Multilevel degenerative disc disease. No acute abnormality seen in  the cervical spine.      Electronically Signed    By: Sabino Dick M.D.    On: 08/07/2013 12:17         Verified By: Marveen Reeks, M.D.,  [place]:    23-Mar-15 14:21, US Carotid Doppler Bilateral  US Carotid Doppler Bilateral   REASON FOR EXAM:    TIA  COMMENTS:       PROCEDURE: [place]  - US CAROTID DOPPLER BILATERAL  - Aug 06 2013  2:21PM     CLINICAL DATA:  TIA, history of hypertension, left-sided visual  disturbance, hyperlipidemia, diabetes, history of CAD (post coronary  artery stent placement)    EXAM:  BILATERAL CAROTID DUPLEX  ULTRASOUND    TECHNIQUE:  Pearline Cables scale imaging, color Doppler and duplex ultrasound were  performed of bilateral carotid and vertebral arteries in the neck.  COMPARISON:  None.    FINDINGS:  Criteria: Quantification of carotid stenosis is based on velocity  parameters that correlate the residual internal carotid diameter  with NASCET-based stenosis levels, using the diameter of the distal  internal carotid lumen as the denominator for stenosis measurement.    The following velocity measurements were obtained:    RIGHT    ICA:  63/20 cm/sec    CCA:  88/41 cm/sec  SYSTOLIC ICA/CCA RATIO:  6.60    DIASTOLIC ICA/CCA RATIO:  6.30    ECA:  61 cm/sec    LEFT    ICA:  82/29 cm/sec    CCA:  16/01 cm/sec    SYSTOLIC ICA/CCA RATIO:  0.9    DIASTOLIC ICA/CCA RATIO:  0.93  ECA:  89 cm/sec    RIGHT CAROTID ARTERY: There is marked tortuosity of the right common  carotid artery (image 5). There is a minimal amount of eccentric  intimal wall thickening within the mid and distal aspects of the  right common carotid arteries (representative images 7 and 10  respectively). There is minimal intimal thickening and eccentric  mixed echogenic plaque within the right carotid bulb (images 13 and  34). There is a minimal amount of eccentric mixed echogenic plaque  involving the right internal carotid artery (images 19, 22 and 26),  not resulting in elevated peak systolic velocities within the right  internal carotid artery to suggest a hemodynamically significant  stenosis.    RIGHT VERTEBRAL ARTERY:  Antegrade flow  LEFT CAROTID ARTERY: There is a minimal amount of intimal thickening  within the distal aspect of the left common carotid artery (image  45). The left internal carotid artery is noted to be tortuous (image  58). There is a minimal amount of eccentric mixed echogenic plaque  within the left carotid bulb (images 48 and 69) extending to involve  the origin proximal aspect of the  left internal carotid artery  (image 54) not resulting inelevated peak systolic velocities within  the interrogated course of the left internal carotid artery.    LEFT VERTEBRAL ARTERY:  Antegrade flow     IMPRESSION:  Minimal amount of bilateral atherosclerotic plaque, right  subjectively greater than left, not resulting in a hemodynamically  significant stenosis.  Electronically Signed    By: Sandi Mariscal M.D.    On: 08/06/2013 14:26         Verified By: Aileen Fass, M.D.,  MRI:    23-Mar-15 16:46, MRI Brain Without Contrast  MRI Brain Without Contrast   REASON FOR EXAM:    numbness in arm? TIA?  COMMENTS:       PROCEDURE: MR  - MR BRAIN WO CONTRAST  - Aug 06 2013  4:46PM     CLINICAL DATA:  Headache with left jaw numbness and left arm  numbness.    EXAM:  MRI HEAD WITHOUT CONTRAST    TECHNIQUE:  Multiplanar, multiecho pulse sequences of the brain and surrounding  structures were obtained without intravenous contrast.  COMPARISON:  US CAROTID DUPLEX BILAT dated 08/06/2013; MR HEAD WO/W  CM dated 07/05/2008    FINDINGS:  No evidence for acute infarction, hemorrhage, mass lesion,  hydrocephalus, or extra-axial fluid. Normal for age cerebral volume.  No white matter disease. Flow voids are maintained throughout the  carotid, basilar, and vertebral arteries. There are no areas of  chronic hemorrhage. Pituitary, pineal, and cerebellar tonsils  unremarkable. No upper cervical lesions. Visualized calvarium, skull  base, and upper cervical osseous structures unremarkable. Scalp and  extracranial soft tissues, orbits, sinuses, and mastoids show no  acute process. Compared with prior MR, a similar appearance is  noted.   IMPRESSION:  Unremarkable MRI brain.  No acute intracranial findings.      Electronically Signed    By: Rolla Flatten M.D.    On: 08/06/2013 17:04         Verified By: Staci Righter, M.D.,  Cardiology:    23-Mar-15 08:38, ECG  Ventricular Rate 63   Atrial Rate 63  P-R Interval 152  QRS Duration 130  QT 442  QTc 452  P Axis 37  R Axis 50  T Axis 9  ECG interpretation   Normal sinus rhythm  Non-specific intra-ventricular conduction block  Nonspecific T wave abnormality  Abnormal ECG  When compared with ECG of 05-Dec-2010 16:25,  QRS duration has increased  Inverted T waves have replaced nonspecific T wave abnormality in Anterior leads  ----------unconfirmed----------  Confirmed by OVERREAD, NOT ([id number]), editor PEARSON, BARBARA ([id number]) on 08/07/2013 2:51:05 PM  ECG    Assessment/Plan:  Assessment/Plan:  Assessment IMP [place] Angina CAD DM HTN Hyperlipidemia GERD Abn EKG .   Plan PLAN Tele NTG ASA po  Continue Bp control Agree with DM control Rec cath in am Lipid therapy   Electronic Signatures: Lujean Amel D (MD)  (Signed 24-Mar-15 23:21)  Authored: Chief Complaint, VITAL SIGNS/ANCILLARY NOTES, Brief Assessment, Lab Results, Radiology Results, Assessment/Plan   Last Updated: 24-Mar-15 23:21 by Yolonda Kida (MD)

## 2014-09-15 NOTE — H&P (Signed)
PATIENT NAME:  Christine BestANNA, Christine J MR#:  [id number] DATE OF BIRTH:  April 21, 1946  DATE OF ADMISSION:  08/16/2014   REFERRING PHYSICIAN: Chiquita LothJade Sung, MD  PRIMARY CARE PHYSICIAN: Maurine MinisterMiriam McLaughlin, PA-C  ADMITTING PHYSICIAN: Jonnie KindEdavally Mercedees Convery, MD  CHIEF COMPLAINT:  1.  Shaking with low blood sugars of 66.  2.  Chest discomfort. 3.  Left shoulder discomfort.  HISTORY OF PRESENT ILLNESS: A 69 year old Asian female with a past medical history of hypertension, diabetes mellitus type 2, hyperlipidemia, coronary artery disease status post stent, herniated disk and hypothyroidism presents to the Emergency Room with the complaints of shaking and noted to have low blood sugars of 66. The patient had some peanut butter at home and on arrival the patient was noted to have low blood sugars with shaking symptoms and also was noted to have elevated blood pressure and was having some chest discomfort and left shoulder discomfort. The patient was evaluated by the ED physician and was found to have elevated blood pressure of 207/91 on arrival and was given sublingual nitroglycerin and nitroglycerin paste following which her blood pressure improved. Her blood sugars also improved and did not have any further episodes of hypoglycemia while in the Emergency Room. Further work-up revealed normal labs and troponin was negative and EKG no acute ST-T changes. Following administration of nitroglycerin, her chest pain resolved completely and denies any complaints at this time. No history of any recent fever, cough, shortness of breath, nausea, vomiting, diarrhea or abdominal pain. The patient states that she was not sick and she took her usual antidiabetic medications last night. Probably she might have less food intake but does not recall any specific reasons for the low blood sugars. No history of any hypoglycemic symptoms in the past.   PAST MEDICAL HISTORY: 1.  Hypertension.  2.  Diabetes mellitus type 2.  3.  Hyperlipidemia.  4.   Herniated disk.  5.  Hypothyroidism.  6.  Coronary artery disease status post stents.   PAST SURGICAL HISTORY: Back surgery.   ALLERGIES: No known drug allergies.   SOCIAL HISTORY: Married, lives at home. Denies any history of smoking, alcohol, or substance abuse.   FAMILY HISTORY: Early coronary artery disease and hypertension in the patient's father who died at age 67. History of diabetes and unknown cancer. Also history of coronary artery disease.   HOME MEDICATIONS:  1.  Aspirin 81 mg 1 tablet orally once a day.  2.  Atorvastatin 40 mg 1 tablet orally once a day.  3.  Calcium with vitamin D 600/200 units 1 tablet once a day.  4.  Glipizide 10 mg oral tablet 1 tablet orally once a day.  5.  Glucophage 1000 mg 1 tablet orally 2 times a day.  6.  Hydrochlorothiazide 25 mg 1 tablet orally once a day.  7.  Isosorbide mononitrate extended release 30 mg 1 tablet orally 2 times a day.  8.  Niaspan extended-release tablet 500 mg tablet orally 1 at bedtime.  9.  Nitroglycerin 0.4 mg sublingual 1 tablet every 5 minutes as needed for chest pain.  10.  Plavix 75 mg 1 tablet orally once a day.  11.  Ranitidine 150 mg 1 tablet orally 2 times a day.  12.  Synthroid 50 mcg 1 tablet orally once a day.   REVIEW OF SYSTEMS: CONSTITUTIONAL: Negative for fever, fatigue, weakness.  EYES: Negative for blurred vision, double vision. No pain. No redness. No discharge.  ENT: Negative for tinnitus, ear pain, hearing loss, epistaxis, nasal  discharge, or difficulty swallowing.  RESPIRATORY: Negative for cough, wheezing, dyspnea, hemoptysis, or painful respiration.  CARDIOVASCULAR: Positive for chest pain and left shoulder pain as noted in the history of present illness. Negative for palpitations, dizziness, syncopal episodes, orthopnea, dyspnea on exertion, or pedal edema.  GASTROINTESTINAL: Negative for nausea, vomiting, diarrhea, abdominal pain, hematemesis, melena, rectal bleeding, or GERD symptoms.   GENITOURINARY: Negative for dysuria, frequency, urgency, or hematuria. ENDOCRINE: Negative for polyuria, nocturia, heat or cold intolerance.  HEMATOLOGIC AND LYMPHATIC: Negative for anemia, easy bruising or bleeding.  INTEGUMENTARY: Negative for acne, skin rash, or lesions.  MUSCULOSKELETAL: Negative for neck or back pain. No history of arthritis or gout.  NEUROLOGICAL: Negative for focal weakness, numbness.  PSYCHIATRIC: Negative for anxiety, insomnia, or depression.   PHYSICAL EXAMINATION: VITAL SIGNS: Temperature 97.8 degrees Fahrenheit, pulse rate 96 per minute, respirations 20 per minute, blood pressure on arrival 207/91, current blood pressure 118/58, and O2 saturation 96% on room air.  GENERAL: Well-developed, well-nourished, alert, no acute distress, comfortably resting in the bed.  HEAD: Atraumatic, normocephalic.  EYES: Pupils are equal, react to light and accommodation. No conjunctival pallor. No icterus. Extraocular movements intact.  NOSE: No drainage. No lesions.  EARS: No drainage. No external lesions.  ORAL CAVITY: No mucosal lesions. No exudates.  NECK: Supple. No JVD. No thyromegaly. No carotid bruit. Range of motion of neck within normal limits.  RESPIRATORY: Good respiratory effort. Not using accessory muscles of respiration. Bilateral vesicular breath sounds and no rales or rhonchi.  CARDIOVASCULAR: S1, S2 regular. No murmurs, gallops, or clicks. Pulses are equal at carotid, femoral, and pedal pulses. No peripheral edema.  GASTROINTESTINAL: Abdomen is soft and nontender. No hepatosplenomegaly. No masses. No rigidity. No guarding. Bowel sounds present and equal in all 4 quadrants.  GENITOURINARY: Deferred.  MUSCULOSKELETAL: No joint tenderness or effusion. Range of motion is adequate. Strength and tone equal bilaterally.  SKIN: Inspection within normal limits. No obvious wounds. LYMPH: No cervical lymphadenopathy.  VASCULAR: Good dorsalis pedis and posterior tibial  pulses.  NEUROLOGIC: Alert, awake, and oriented x3. Cranial nerves II through XII grossly intact. No sensory deficits. Motor strength 5/5 in both upper and lower extremities. DTRs 2+ bilateral, symmetrical. Plantars downgoing.  PSYCHIATRIC: Alert, awake, and oriented x3. Judgment and insight adequate. Memory and mood within normal limits.   ANCILLARY DATA: Labs: Serum glucose 117, BUN 13, creatinine 0.76, sodium 141, potassium 3.6, chloride 103, bicarb 27, calcium 9.6. Troponin less than 0.03. WBC 8.7, hemoglobin 13.9, hematocrit 42.5, platelet count 338,000. Urinalysis unremarkable.   Chest x-ray: No acute cardiopulmonary process.   EKG: Normal sinus rhythm with ventricular rate of 85 beats per minute, right bundle branch block. No acute ST-T changes.   ASSESSMENT AND PLAN: A 69 year old Asian female with a history of hypertension, diabetes mellitus type 2, hyperlipidemia, coronary artery disease status post stents, and hypothyroidism who presents with hypoglycemia symptoms with blood sugars of 66, also noted to have elevated blood pressure on arrival, complaining of left-sided chest pain, which is atypical, admitted for observation.  1.  Hypoglycemia. Reason not known. The patient is a known diabetic on multiple oral medications. Likely inadequate food intake versus medication related. Plan: Admit for observation, hold oral antidiabetic medications for now and sliding scale insulin. Monitor blood sugars closely and readjust medications accordingly.  2.  Chest pain, left-sided, which is atypical. EKG no acute changes. Troponin x2 negative. Rule out cardiac etiology in the setting of history of coronary artery disease.  3.  Hypertension.  Stable blood pressure on home medications. Continue home medications.  4.  Diabetes mellitus, type 2, on multiple oral medications. Hold oral medications for now because of hypoglycemic symptoms. Monitor blood sugars closely. Sliding scale insulin. Restart oral  medications accordingly.  5.  Hyperlipidemia. On statin. Continue same.  6.  Hypothyroidism. Stable on Synthroid. Continue same. 7.  Deep vein thrombosis prophylaxis. Subcutaneous Lovenox.  8.  Gastrointestinal prophylaxis. Proton pump inhibitor.   CODE STATUS: FULL.  TIME SPENT: 50 minutes.  ____________________________ Crissie Figures, MD enr:sb D: 08/16/2014 07:13:00 ET T: 08/16/2014 07:47:51 ET JOB#: [id number]  cc: Crissie Figures, MD, <Dictator> [id number] CourtMimi" Merlinda Frederick, PA-C Debbe Mounts Idaly Verret MD ELECTRONICALLY SIGNED 08/16/2014 18:28

## 2014-09-15 NOTE — Discharge Summary (Signed)
PATIENT NAME:  Christine BestANNA, Christine Zimmerman MR#:  [id number] DATE OF BIRTH:  [birth year]  DATE OF ADMISSION:  08/16/2014 DATE OF DISCHARGE:  08/16/2014  For a detailed note, please take a look at the history and physical done on admission by Dr. Betti Cruzeddy.   DISCHARGE DIAGNOSES:  1.  Hypoglycemia secondary to poor p.o. intake. 2.  Chest pain, atypical, likely musculoskeletal in nature. 3.  Hypertension. 4.  Diabetes. 5.  Hyperlipidemia. 6.  Hypothyroidism.   DISCHARGE DIET: The patient is being discharged on a low-sodium, low-fat, carb-controlled diet.   DISCHARGE ACTIVITY: As tolerated.   DISCHARGE INSTRUCTIONS: Follow up with Lora PaulaMimi McLaughlin in the next 1 to 2 weeks.  DISCHARGE MEDICATIONS: Atorvastatin 40 mg at bedtime, ranitidine 150 mg b.i.d., HCTZ 25 mg daily, Synthroid 50 mcg daily, Glucophage 1000 mg b.i.d., calcium vitamin D 1 tablet daily, aspirin 81 mg daily, Plavix 75 mg daily, Niaspan extended release 500 mg at bedtime, Imdur 30 mg b.i.d., sublingual nitroglycerin q. 5 minutes as needed, glipizide 10 mg daily.   PERTINENT STUDIES DURING HOSPITAL COURSE: Chest x-ray on admission showed no acute pulmonary process.   BRIEF HOSPITAL COURSE: This is a 69 year old female with medical problems as mentioned above who presented to the hospital with chest pain and also noted to be hypoglycemic. 1.  Hypoglycemia. This was likely secondary to poor p.o. intake. The patient received some dextrose and was given some food and blood sugars have improved since then. Her diabetic medications have not been recently changed. For now she will continue her metformin with close follow-up with her primary regarding management of her diabetes.  2.  Chest pain. This was atypical and likely noncardiac and likely musculoskeletal in nature. Given her history of diabetes, the patient was observed overnight on telemetry, had 3 sets of cardiac markers checked which were negative. Her chest pain is now resolved. She is therefore  being discharged home with close follow up with Dr. Juliann Paresallwood, her cardiologist, as an outpatient.  3.  Hypertension. The patient remained hemodynamically stable. She will continue her Imdur/HCTZ.  4.  Hypothyroidism. The patient will continue her Synthroid. 5.  Hyperlipidemia. The patient was maintained on atorvastatin. She will resume that upon discharge.   CODE STATUS: The patient is a FULL code.   TIME SPENT ON DISCHARGE: 35 minutes.  ____________________________ Rolly PancakeVivek Zimmerman. Cherlynn KaiserSainani, MD vjs:sb D: 08/16/2014 15:50:01 ET T: 08/16/2014 16:23:04 ET JOB#: [id number]  cc: Rolly PancakeVivek Zimmerman. Cherlynn KaiserSainani, MD, <Dictator> [address][name], [place] JerseyPA-C Houston SirenVIVEK Zimmerman Riah Kehoe MD ELECTRONICALLY SIGNED 08/22/2014 16:22

## 2014-09-18 ENCOUNTER — Other Ambulatory Visit: Payer: Self-pay | Admitting: Physician Assistant

## 2014-09-18 DIAGNOSIS — R921 Mammographic calcification found on diagnostic imaging of breast: Secondary | ICD-10-CM

## 2014-10-02 ENCOUNTER — Other Ambulatory Visit: Payer: Self-pay | Admitting: Neurology

## 2014-10-02 DIAGNOSIS — R2689 Other abnormalities of gait and mobility: Secondary | ICD-10-CM

## 2014-10-02 DIAGNOSIS — R42 Dizziness and giddiness: Secondary | ICD-10-CM

## 2014-10-09 ENCOUNTER — Ambulatory Visit
Admission: RE | Admit: 2014-10-09 | Discharge: 2014-10-09 | Disposition: A | Discharge status: Home / Self Care | Payer: Medicare Other | Source: Ambulatory Visit | Attending: Neurology | Admitting: Neurology

## 2014-10-09 DIAGNOSIS — R2689 Other abnormalities of gait and mobility: Secondary | ICD-10-CM | POA: Insufficient documentation

## 2014-10-09 DIAGNOSIS — R42 Dizziness and giddiness: Secondary | ICD-10-CM | POA: Diagnosis present

## 2014-10-09 DIAGNOSIS — R200 Anesthesia of skin: Secondary | ICD-10-CM | POA: Insufficient documentation

## 2014-12-30 ENCOUNTER — Other Ambulatory Visit: Payer: Self-pay | Admitting: Neurology

## 2014-12-30 DIAGNOSIS — R200 Anesthesia of skin: Secondary | ICD-10-CM

## 2014-12-30 DIAGNOSIS — R2689 Other abnormalities of gait and mobility: Secondary | ICD-10-CM

## 2015-01-02 ENCOUNTER — Ambulatory Visit
Admission: RE | Admit: 2015-01-02 | Discharge: 2015-01-02 | Disposition: A | Discharge status: Home / Self Care | Payer: Medicare Other | Source: Ambulatory Visit | Attending: Neurology | Admitting: Neurology

## 2015-01-02 DIAGNOSIS — R2689 Other abnormalities of gait and mobility: Secondary | ICD-10-CM | POA: Diagnosis present

## 2015-01-02 DIAGNOSIS — M4802 Spinal stenosis, cervical region: Secondary | ICD-10-CM | POA: Insufficient documentation

## 2015-01-02 DIAGNOSIS — R42 Dizziness and giddiness: Secondary | ICD-10-CM | POA: Insufficient documentation

## 2015-01-02 DIAGNOSIS — R200 Anesthesia of skin: Secondary | ICD-10-CM

## 2015-01-02 DIAGNOSIS — R531 Weakness: Secondary | ICD-10-CM | POA: Diagnosis present

## 2015-02-27 ENCOUNTER — Ambulatory Visit
Admission: RE | Admit: 2015-02-27 | Discharge: 2015-02-27 | Disposition: A | Discharge status: Home / Self Care | Payer: Medicare Other | Source: Ambulatory Visit | Attending: Physician Assistant | Admitting: Physician Assistant

## 2015-02-27 ENCOUNTER — Other Ambulatory Visit: Payer: Self-pay | Admitting: Physician Assistant

## 2015-02-27 DIAGNOSIS — R921 Mammographic calcification found on diagnostic imaging of breast: Secondary | ICD-10-CM

## 2015-05-19 ENCOUNTER — Emergency Department: Payer: Medicare Other

## 2015-05-19 ENCOUNTER — Emergency Department
Admission: EM | Admit: 2015-05-19 | Discharge: 2015-05-19 | Disposition: A | Discharge status: Home / Self Care | Payer: Medicare Other | Attending: Emergency Medicine | Admitting: Emergency Medicine

## 2015-05-19 ENCOUNTER — Encounter: Payer: Self-pay | Admitting: Emergency Medicine

## 2015-05-19 DIAGNOSIS — R079 Chest pain, unspecified: Secondary | ICD-10-CM

## 2015-05-19 DIAGNOSIS — I200 Unstable angina: Secondary | ICD-10-CM

## 2015-05-19 DIAGNOSIS — E119 Type 2 diabetes mellitus without complications: Secondary | ICD-10-CM | POA: Insufficient documentation

## 2015-05-19 DIAGNOSIS — R001 Bradycardia, unspecified: Secondary | ICD-10-CM

## 2015-05-19 DIAGNOSIS — Z7982 Long term (current) use of aspirin: Secondary | ICD-10-CM | POA: Insufficient documentation

## 2015-05-19 DIAGNOSIS — Z7902 Long term (current) use of antithrombotics/antiplatelets: Secondary | ICD-10-CM | POA: Diagnosis not present

## 2015-05-19 DIAGNOSIS — Z79899 Other long term (current) drug therapy: Secondary | ICD-10-CM | POA: Diagnosis not present

## 2015-05-19 DIAGNOSIS — Z7984 Long term (current) use of oral hypoglycemic drugs: Secondary | ICD-10-CM | POA: Insufficient documentation

## 2015-05-19 DIAGNOSIS — I10 Essential (primary) hypertension: Secondary | ICD-10-CM | POA: Insufficient documentation

## 2015-05-19 DIAGNOSIS — I25110 Atherosclerotic heart disease of native coronary artery with unstable angina pectoris: Secondary | ICD-10-CM | POA: Insufficient documentation

## 2015-05-19 HISTORY — DX: Atherosclerotic heart disease of native coronary artery without angina pectoris: I25.10

## 2015-05-19 HISTORY — DX: Gastro-esophageal reflux disease without esophagitis: K21.9

## 2015-05-19 HISTORY — DX: Pure hypercholesterolemia, unspecified: E78.00

## 2015-05-19 HISTORY — DX: Hypothyroidism, unspecified: E03.9

## 2015-05-19 HISTORY — DX: Type 2 diabetes mellitus without complications: E11.9

## 2015-05-19 HISTORY — DX: Essential (primary) hypertension: I10

## 2015-05-19 LAB — CBC WITH DIFFERENTIAL/PLATELET
BASOS ABS: 0 10*3/uL (ref 0–0.1)
Basophils Relative: 1 %
Eosinophils Absolute: 0.1 10*3/uL (ref 0–0.7)
Eosinophils Relative: 1 %
HCT: 38.3 % (ref 35.0–47.0)
Hemoglobin: 13.2 g/dL (ref 12.0–16.0)
LYMPHS PCT: 20 %
Lymphs Abs: 1.6 10*3/uL (ref 1.0–3.6)
MCH: 31 pg (ref 26.0–34.0)
MCHC: 34.4 g/dL (ref 32.0–36.0)
MCV: 90.1 fL (ref 80.0–100.0)
Monocytes Absolute: 0.6 10*3/uL (ref 0.2–0.9)
Monocytes Relative: 8 %
NEUTROS ABS: 5.8 10*3/uL (ref 1.4–6.5)
NEUTROS PCT: 70 %
PLATELETS: 259 10*3/uL (ref 150–440)
RBC: 4.26 MIL/uL (ref 3.80–5.20)
RDW: 13.5 % (ref 11.5–14.5)
WBC: 8.2 10*3/uL (ref 3.6–11.0)

## 2015-05-19 LAB — COMPREHENSIVE METABOLIC PANEL
ALBUMIN: 3.9 g/dL (ref 3.5–5.0)
ALT: 18 U/L (ref 14–54)
ANION GAP: 8 (ref 5–15)
AST: 15 U/L (ref 15–41)
Alkaline Phosphatase: 69 U/L (ref 38–126)
BUN: 12 mg/dL (ref 6–20)
CALCIUM: 9.4 mg/dL (ref 8.9–10.3)
CHLORIDE: 100 mmol/L — AB (ref 101–111)
CO2: 25 mmol/L (ref 22–32)
CREATININE: 0.57 mg/dL (ref 0.44–1.00)
GFR calc non Af Amer: >60 mL/min (ref >60)
Glucose, Bld: 207 mg/dL — ABNORMAL HIGH (ref 65–99)
POTASSIUM: 3.9 mmol/L (ref 3.5–5.1)
SODIUM: 133 mmol/L — AB (ref 135–145)
TOTAL PROTEIN: 6.7 g/dL (ref 6.5–8.1)
Total Bilirubin: 0.7 mg/dL (ref 0.3–1.2)

## 2015-05-19 LAB — TROPONIN I: Troponin I: <0.03 ng/mL (ref <0.031)

## 2015-05-19 MED ORDER — NITROGLYCERIN 0.4 MG SL SUBL: 0.4000 mg | SUBLINGUAL_TABLET | SUBLINGUAL | Status: DC | PRN

## 2015-05-19 MED ORDER — SODIUM CHLORIDE 0.9 % IV BOLUS (SEPSIS)
500.0000 mL | Freq: Once | INTRAVENOUS | Status: AC
  Administered 2015-05-19: 500 mL via INTRAVENOUS

## 2015-05-19 MED ORDER — NITROGLYCERIN 2 % TD OINT
0.5000 [in_us] | TOPICAL_OINTMENT | Freq: Once | TRANSDERMAL | Status: AC
  Administered 2015-05-19: 0.5 [in_us] via TOPICAL

## 2015-05-19 MED ORDER — NITROGLYCERIN 2 % TD OINT
TOPICAL_OINTMENT | TRANSDERMAL | Status: AC
  Administered 2015-05-19: 0.5 [in_us] via TOPICAL
  Filled 2015-05-19: qty 1

## 2015-05-19 NOTE — ED Notes (Signed)
Pt awake, up to toilet to void; steady gait

## 2015-05-19 NOTE — Discharge Instructions (Signed)
1. Discontinue metoprolol until seen by your heart doctor. 2. Return to the ER for worsening symptoms, persistent vomiting, difficulty breathing or other concerns.  Bradycardia Bradycardia is a slower-than-normal heart rate. A normal resting heart rate for an adult ranges from 60 to 100 beats per minute. With bradycardia, the resting heart rate is less than 60 beats per minute. Bradycardia is a problem if your heart cannot pump enough oxygen-rich blood through your body. Bradycardia is not a problem for everyone. For some healthy adults, a slow resting heart rate is normal.  CAUSES  Bradycardia may be caused by:  A problem with the heart's electrical system, such as heart block.  A problem with the heart's natural pacemaker (sinus node).  Heart disease, damage, or infection.  Certain medicines that treat heart conditions.  Certain conditions, such as hypothyroidism and obstructive sleep apnea. RISK FACTORS  Risk factors include:  Being 3565 or older.  Having high blood pressure (hypertension), high cholesterol (hyperlipidemia), or diabetes.  Drinking heavily, using tobacco products, or using drugs.  Being stressed. SIGNS AND SYMPTOMS  Signs and symptoms include:  Light-headedness.  Faintingor near fainting.  Fatigue and weakness.  Shortness of breath.  Chest pain (angina).  Drowsiness.  Confusion.  Dizziness. DIAGNOSIS  Diagnosis of bradycardia may include:  A physical exam.  An electrocardiogram (ECG).  Blood tests. TREATMENT  Treatment for bradycardia may include:  Treatment of an underlying condition.  Pacemaker placement. A pacemaker is a small, battery-powered device that is placed under the skin and is programmed to sense your heartbeats. If your heart rate is lower than the programmed rate, the pacemaker will pace your heart.  Changing your medicines or dosages. HOME CARE INSTRUCTIONS  Take medicines only as directed by your health care  provider.  Manage any health conditions that contribute to bradycardia as directed by your health care provider.  Follow a heart-healthy diet. A dietitian can help educate you on healthy food options and changes.  Follow an exercise program approved by your health care provider.  Maintain a healthy weight. Lose weight as approved by your health care provider.  Do not use tobacco products, including cigarettes, chewing tobacco, or electronic cigarettes. If you need help quitting, ask your health care provider.  Do not use illegal drugs.  Limit alcohol intake to no more than 1 drink per day for nonpregnant women and 2 drinks per day for men. One drink equals 12 ounces of beer, 5 ounces of wine, or 1 ounces of hard liquor.  Keep all follow-up visits as directed by your health care provider. This is important. SEEK MEDICAL CARE IF:  You feel light-headed or dizzy.  You almost faint.  You feel weak or are easily fatigued during physical activity.  You experience confusion or have memory problems. SEEK IMMEDIATE MEDICAL CARE IF:   You faint.  You have an irregular heartbeat.  You have chest pain.  You have trouble breathing. MAKE SURE YOU:   Understand these instructions.  Will watch your condition.  Will get help right away if you are not doing well or get worse.   This information is not intended to replace advice given to you by your health care provider. Make sure you discuss any questions you have with your health care provider.   Document Released: 01/23/2002 Document Revised: 05/24/2014 Document Reviewed: 08/08/2013 Elsevier Interactive Patient Education 2016 Else[employer]  Nonspecific Chest Pain  Chest pain can be caused by many different conditions. There is always a chance that  your pain could be related to something serious, such as a heart attack or a blood clot in your lungs. Chest pain can also be caused by conditions that are not life-threatening. If you  have chest pain, it is very important to follow up with your health care provider. CAUSES  Chest pain can be caused by:  Heartburn.  Pneumonia or bronchitis.  Anxiety or stress.  Inflammation around your heart (pericarditis) or lung (pleuritis or pleurisy).  A blood clot in your lung.  A collapsed lung (pneumothorax). It can develop suddenly on its own (spontaneous pneumothorax) or from trauma to the chest.  Shingles infection (varicella-zoster virus).  Heart attack.  Damage to the bones, muscles, and cartilage that make up your chest wall. This can include:  Bruised bones due to injury.  Strained muscles or cartilage due to frequent or repeated coughing or overwork.  Fracture to one or more ribs.  Sore cartilage due to inflammation (costochondritis). RISK FACTORS  Risk factors for chest pain may include:  Activities that increase your risk for trauma or injury to your chest.  Respiratory infections or conditions that cause frequent coughing.  Medical conditions or overeating that can cause heartburn.  Heart disease or family history of heart disease.  Conditions or health behaviors that increase your risk of developing a blood clot.  Having had chicken pox (varicella zoster). SIGNS AND SYMPTOMS Chest pain can feel like:  Burning or tingling on the surface of your chest or deep in your chest.  Crushing, pressure, aching, or squeezing pain.  Dull or sharp pain that is worse when you move, cough, or take a deep breath.  Pain that is also felt in your back, neck, shoulder, or arm, or pain that spreads to any of these areas. Your chest pain may come and go, or it may stay constant. DIAGNOSIS Lab tests or other studies may be needed to find the cause of your pain. Your health care provider may have you take a test called an ambulatory ECG (electrocardiogram). An ECG records your heartbeat patterns at the time the test is performed. You may also have other tests, such  as:  Transthoracic echocardiogram (TTE). During echocardiography, sound waves are used to create a picture of all of the heart structures and to look at how blood flows through your heart.  Transesophageal echocardiogram (TEE).This is a more advanced imaging test that obtains images from inside your body. It allows your health care provider to see your heart in finer detail.  Cardiac monitoring. This allows your health care provider to monitor your heart rate and rhythm in real time.  Holter monitor. This is a portable device that records your heartbeat and can help to diagnose abnormal heartbeats. It allows your health care provider to track your heart activity for several days, if needed.  Stress tests. These can be done through exercise or by taking medicine that makes your heart beat more quickly.  Blood tests.  Imaging tests. TREATMENT  Your treatment depends on what is causing your chest pain. Treatment may include:  Medicines. These may include:  Acid blockers for heartburn.  Anti-inflammatory medicine.  Pain medicine for inflammatory conditions.  Antibiotic medicine, if an infection is present.  Medicines to dissolve blood clots.  Medicines to treat coronary artery disease.  Supportive care for conditions that do not require medicines. This may include:  Resting.  Applying heat or cold packs to injured areas.  Limiting activities until pain decreases. HOME CARE INSTRUCTIONS  If you  were prescribed an antibiotic medicine, finish it all even if you start to feel better.  Avoid any activities that bring on chest pain.  Do not use any tobacco products, including cigarettes, chewing tobacco, or electronic cigarettes. If you need help quitting, ask your health care provider.  Do not drink alcohol.  Take medicines only as directed by your health care provider.  Keep all follow-up visits as directed by your health care provider. This is important. This includes any  further testing if your chest pain does not go away.  If heartburn is the cause for your chest pain, you may be told to keep your head raised (elevated) while sleeping. This reduces the chance that acid will go from your stomach into your esophagus.  Make lifestyle changes as directed by your health care provider. These may include:  Getting regular exercise. Ask your health care provider to suggest some activities that are safe for you.  Eating a heart-healthy diet. A registered dietitian can help you to learn healthy eating options.  Maintaining a healthy weight.  Managing diabetes, if necessary.  Reducing stress. SEEK MEDICAL CARE IF:  Your chest pain does not go away after treatment.  You have a rash with blisters on your chest.  You have a fever. SEEK IMMEDIATE MEDICAL CARE IF:   Your chest pain is worse.  You have an increasing cough, or you cough up blood.  You have severe abdominal pain.  You have severe weakness.  You faint.  You have chills.  You have sudden, unexplained chest discomfort.  You have sudden, unexplained discomfort in your arms, back, neck, or jaw.  You have shortness of breath at any time.  You suddenly start to sweat, or your skin gets clammy.  You feel nauseous or you vomit.  You suddenly feel light-headed or dizzy.  Your heart begins to beat quickly, or it feels like it is skipping beats. These symptoms may represent a serious problem that is an emergency. Do not wait to see if the symptoms will go away. Get medical help right away. Call your local emergency services (911 in the [place] Do not drive yourself to the hospital.   This information is not intended to replace advice given to you by your health care provider. Make sure you discuss any questions you have with your health care provider.   Document Released: 02/10/2005 Document Revised: 05/24/2014 Document Reviewed: 12/07/2013 Elsevier Interactive Patient Education [employer].

## 2015-05-19 NOTE — ED Notes (Signed)
Pt took own Nitro with no relief; 4-81mg  ASA given prior to arrival

## 2015-05-19 NOTE — Consult Note (Signed)
[facility] - [place] [facility]   PATIENT NAME: Christine Zimmerman    MR#:  [id number]  DATE OF BIRTH:  15-Nov-1945  DATE OF ADMISSION:  05/19/2015  PRIMARY CARE PHYSICIAN: No primary care provider on file.   CONSULT REQUESTING/REFERRING PHYSICIAN: Chiquita Loth MD  REASON FOR CONSULT: Left shoulder blade and arm pain with bradycardia  CHIEF COMPLAINT:   Chief Complaint  Patient presents with  . Chest Pain  . Back Pain    HISTORY OF PRESENT ILLNESS:  Christine Zimmerman  is a 70 y.o. female with a known history of hypertension, diabetes, CAD and cervical neuropathy presents to the emergency room with complaints of some dizziness. Patient seems very anxious regarding her medical problems during history taking. Initially she noticed that her blood sugars weight 170 which was higher than normal for her. Then she checked her blood pressure and found that her systolic blood pressure was in the 170s. She drank some milk and ate some crackers and went to bed. Then felt a little dizzy and fatigued. She was told by her doctors that uncontrolled blood pressure and blood sugars are high risk for future heart attacks and just to be safe she called EMS and presented to the emergency room. Here she has been found to have sinus bradycardia into the 50s with normal blood pressure. Blood sugar was 207. She does have a chronic left sided neck pain dating radiating to her left shoulder and left arm along with some tingling. She has been evaluated with a cardiac catheterization in 2015 and nuclear stress test in April 2016 for this pain. She was also referred to neurology Dr. Sherryll Burger who ordered an MRI recently. Due to issues with her cervical spine she has been referred to a neurosurgeon as outpatient. Her symptoms are unchanged.  EKG looks stable other than mild bradycardia. Troponin is normal.  PAST MEDICAL HISTORY:   Past Medical History  Diagnosis Date  . Diabetes mellitus without  complication (HCC)   . Hypertension   . Coronary artery disease   . Hypothyroidism   . GERD (gastroesophageal reflux disease)   . High cholesterol     PAST SURGICAL HISTOIRY:   Past Surgical History  Procedure Laterality Date  . Back surgery      SOCIAL HISTORY:   Social History  Substance Use Topics  . Smoking status: Never Smoker   . Smokeless tobacco: Not on file  . Alcohol Use: No    FAMILY HISTORY:   Family History  Problem Relation Age of Onset  . Breast cancer Neg Hx     DRUG ALLERGIES:   Allergies  Allergen Reactions  . Biaxin [Clarithromycin] Hives    REVIEW OF SYSTEMS:   ROS  CONSTITUTIONAL: No fever. Complains of fatigue EYES: No blurred or double vision.  EARS, NOSE, AND THROAT: No tinnitus or ear pain.  RESPIRATORY: No cough, shortness of breath, wheezing or hemoptysis.  CARDIOVASCULAR: No chest pain, orthopnea, edema.  GASTROINTESTINAL: No nausea, vomiting, diarrhea or abdominal pain.  GENITOURINARY: No dysuria, hematuria.  ENDOCRINE: No polyuria, nocturia,  HEMATOLOGY: No anemia, easy bruising or bleeding SKIN: No rash or lesion. MUSCULOSKELETAL: No joint pain or arthritis.   NEUROLOGIC: Tingling and pain in left arm is chronic PSYCHIATRY: No anxiety or depression.   MEDICATIONS AT HOME:   Prior to Admission medications   Medication Sig Start Date End Date Taking? Authorizing Provider  aspirin EC 81 MG tablet Take 81 mg by mouth daily.   Yes  Historical Provider, MD  atorvastatin (LIPITOR) 40 MG tablet Take 40 mg by mouth every evening.   Yes Historical Provider, MD  calcium-vitamin D (OSCAL 500/200 D-3) 500-200 MG-UNIT tablet Take 1 tablet by mouth daily.   Yes Historical Provider, MD  clopidogrel (PLAVIX) 75 MG tablet Take 75 mg by mouth daily.   Yes Historical Provider, MD  cyanocobalamin (,VITAMIN B-12,) 1000 MCG/ML injection Inject 1,000 mcg into the muscle every 30 (thirty) days.   Yes Historical Provider, MD  glipiZIDE (GLUCOTROL)  5 MG tablet Take 5 mg by mouth daily. Will take 2.5mg  if blood sugar is low   Yes Historical Provider, MD  hydrochlorothiazide (HYDRODIURIL) 25 MG tablet Take 25 mg by mouth daily.   Yes Historical Provider, MD  isosorbide mononitrate (IMDUR) 30 MG 24 hr tablet Take 30 mg by mouth daily.   Yes Historical Provider, MD  levothyroxine (SYNTHROID, LEVOTHROID) 50 MCG tablet Take 50 mcg by mouth daily.   Yes Historical Provider, MD  lisinopril (PRINIVIL,ZESTRIL) 10 MG tablet Take 10 mg by mouth daily.   Yes Historical Provider, MD  metFORMIN (GLUCOPHAGE) 1000 MG tablet Take 1,000 mg by mouth 2 (two) times daily.   Yes Historical Provider, MD  metoprolol tartrate (LOPRESSOR) 25 MG tablet Take 25 mg by mouth 2 (two) times daily.   Yes Historical Provider, MD  niacin 500 MG tablet Take 500 mg by mouth daily.   Yes Historical Provider, MD  nitroGLYCERIN (NITROLINGUAL) 0.4 MG/SPRAY spray Place 1 spray under the tongue every 5 (five) minutes x 3 doses as needed for chest pain.   Yes Historical Provider, MD  ranitidine (ZANTAC) 150 MG tablet Take 150 mg by mouth 2 (two) times daily.   Yes Historical Provider, MD      VITAL SIGNS:  Blood pressure 125/54, pulse 50, temperature 98.4 F (36.9 C), temperature source Oral, resp. rate 11, height 5\' 2"  (1.575 m), weight 74.39 kg (164 lb), SpO2 98 %.  PHYSICAL EXAMINATION:  GENERAL:  70 y.o.-year-old patient lying in the bed with no acute distress.  EYES: Pupils equal, round, reactive to light and accommodation. No scleral icterus. Extraocular muscles intact.  HEENT: Head atraumatic, normocephalic. Oropharynx and nasopharynx clear.  NECK:  Supple, no jugular venous distention. No thyroid enlargement, no tenderness.  LUNGS: Normal breath sounds bilaterally, no wheezing, rales,rhonchi or crepitation. No use of accessory muscles of respiration.  CARDIOVASCULAR: S1, S2 normal. No murmurs, rubs, or gallops.  ABDOMEN: Soft, nontender, nondistended. Bowel sounds present.  No organomegaly or mass.  EXTREMITIES: No pedal edema, cyanosis, or clubbing.  NEUROLOGIC: Cranial nerves II through XII are intact. Muscle strength 5/5 in all extremities. Sensation intact. Gait not checked.  PSYCHIATRIC: The patient is alert and oriented x 3.  SKIN: No obvious rash, lesion, or ulcer.   LABORATORY PANEL:   CBC  Recent Labs Lab 05/19/15 0049  WBC 8.2  HGB 13.2  HCT 38.3  PLT 259   ------------------------------------------------------------------------------------------------------------------  Chemistries   Recent Labs Lab 05/19/15 0050  NA 133*  K 3.9  CL 100*  CO2 25  GLUCOSE 207*  BUN 12  CREATININE 0.57  CALCIUM 9.4  AST 15  ALT 18  ALKPHOS 69  BILITOT 0.7   ------------------------------------------------------------------------------------------------------------------  Cardiac Enzymes  Recent Labs Lab 05/19/15 0050  TROPONINI <0.03   ------------------------------------------------------------------------------------------------------------------  RADIOLOGY:  Dg Chest Port 1 View  05/19/2015  CLINICAL DATA:  Acute onset of intermittent left upper chest pain, radiating to the left side of the neck and scapulae.  Left arm heaviness. Initial encounter. EXAM: PORTABLE CHEST 1 VIEW COMPARISON:  Chest radiograph performed 08/16/2014 FINDINGS: The lungs are well-aerated. Mild vascular congestion is noted. There is no evidence of focal opacification, pleural effusion or pneumothorax. The cardiomediastinal silhouette is borderline enlarged. No acute osseous abnormalities are seen. IMPRESSION: Mild vascular congestion and borderline cardiomegaly. Lungs remain grossly clear. Electronically Signed   By: Roanna Raider M.D.   On: 05/19/2015 01:25    EKG:   Orders placed or performed in visit on 08/06/13  . EKG 12-Lead    IMPRESSION AND PLAN:   * Left Sided neck shoulder blade and arm pain with tingling This has been a chronic problem for the  patient with involvement of C5-6 reviewing her recent MRI. She does follow with Dr. Sherryll Burger of neurology who has referred her to neurosurgery for further evaluation. She doesn't seem to have any deficits at this point and symptoms are stable.  * Sinus Bradycardia with dizziness Patient started taking metoprolol since 2 days and yesterday night was her second dose. This is cause some mild bradycardia with low-dose of 50s. Blood pressure is stable. Stop metoprolol and have patient follow with her primary care physician.  * CAD stable  * Anxiety Patient seems extremely anxious regarding her medical problems and needs close follow-up. Continue medications.   Discussed Dr. Dolores Frame of emergency room. Patient is being discharged home in a stable condition after stopping her metoprolol and to follow with her primary care physician.  All the records are reviewed and case discussed with Consulting provider. Management plans discussed with the patient, family and they are in agreement.   TOTAL TIME TAKING CARE OF THIS PATIENT: 40 minutes.    Milagros Loll R M.D on 05/19/2015 at 3:16 AM  Between 7am to 6pm - Pager - [phone]  After 6pm go to [email or web address] - password [email or web address]  [facility]  Office  [phone]  CC: Primary care Physician: No primary care provider on file.     Note: This dictation was prepared with Dragon dictation along with smaller phrase technology. Any transcriptional errors that result from this process are unintentional.

## 2015-05-19 NOTE — ED Notes (Signed)
Pt assisted by ED tech Beth to toilet in room to void and have bowel movement; back to bed; covered in warm blankets; side rails up x 2 with callbell in reach;

## 2015-05-19 NOTE — ED Provider Notes (Signed)
[facility] Emergency Department Provider Note  __[facility]__________________________________________  Time seen: Approximately 12:48 AM  I have reviewed the triage vital signs and the nursing notes.   HISTORY  Chief Complaint Chest Pain and Back Pain    HPI Christine Zimmerman is a 70 y.o. female who presents to the ED from home via EMS with a chief complaint of chest pain. Patient has a history of CAD s/p 5 cardiac stents who began to experience chest pain approximately one day prior. Complains of waxing/waning left-sided chest tightness, left arm heaviness and pain in her shoulder blades. Started 25 mg metoprolol 2 days ago and has been experiencing intermittent lightheadedness and fatigue since. Denies associated diaphoresis, nausea or shortness of breath. Denies recent travel or trauma. Denies recent fever, chills, abdominal pain, diarrhea, dysuria. Took nitroglycerin at home; aspirin given per EMS prior to arrival with partial relief of symptoms.   Past Medical History  Diagnosis Date  . Diabetes mellitus without complication (HCC)   . Hypertension   . Coronary artery disease   . Hypothyroidism   . GERD (gastroesophageal reflux disease)   . High cholesterol     There are no active problems to display for this patient.   Past Surgical History  Procedure Laterality Date  . Back surgery      Current Outpatient Rx  Name  Route  Sig  Dispense  Refill  . aspirin EC 81 MG tablet   Oral   Take 81 mg by mouth daily.         Marland Kitchen. atorvastatin (LIPITOR) 40 MG tablet   Oral   Take 40 mg by mouth every evening.         . calcium-vitamin D (OSCAL 500/200 D-3) 500-200 MG-UNIT tablet   Oral   Take 1 tablet by mouth daily.         . clopidogrel (PLAVIX) 75 MG tablet   Oral   Take 75 mg by mouth daily.         . cyanocobalamin (,VITAMIN B-12,) 1000 MCG/ML injection   Intramuscular   Inject 1,000 mcg into the muscle every 30 (thirty) days.         Marland Kitchen.  glipiZIDE (GLUCOTROL) 5 MG tablet   Oral   Take 5 mg by mouth daily. Will take 2.5mg  if blood sugar is low         . hydrochlorothiazide (HYDRODIURIL) 25 MG tablet   Oral   Take 25 mg by mouth daily.         . isosorbide mononitrate (IMDUR) 30 MG 24 hr tablet   Oral   Take 30 mg by mouth daily.         Marland Kitchen. levothyroxine (SYNTHROID, LEVOTHROID) 50 MCG tablet   Oral   Take 50 mcg by mouth daily.         Marland Kitchen. lisinopril (PRINIVIL,ZESTRIL) 10 MG tablet   Oral   Take 10 mg by mouth daily.         . metFORMIN (GLUCOPHAGE) 1000 MG tablet   Oral   Take 1,000 mg by mouth 2 (two) times daily.         . metoprolol tartrate (LOPRESSOR) 25 MG tablet   Oral   Take 25 mg by mouth 2 (two) times daily.         . niacin 500 MG tablet   Oral   Take 500 mg by mouth daily.         . nitroGLYCERIN (NITROLINGUAL) 0.4 MG/SPRAY  spray   Sublingual   Place 1 spray under the tongue every 5 (five) minutes x 3 doses as needed for chest pain.         . ranitidine (ZANTAC) 150 MG tablet   Oral   Take 150 mg by mouth 2 (two) times daily.           Allergies Biaxin  Family History  Problem Relation Age of Onset  . Breast cancer Neg Hx     Social History Social History  Substance Use Topics  . Smoking status: Never Smoker   . Smokeless tobacco: None  . Alcohol Use: No    Review of Systems Constitutional: No fever/chills Eyes: No visual changes. ENT: No sore throat. Cardiovascular:Positive for chest pain. Respiratory: Denies shortness of breath. Gastrointestinal: No abdominal pain.  No nausea, no vomiting.  No diarrhea.  No constipation. Genitourinary: Negative for dysuria. Musculoskeletal: Negative for back pain. Skin: Negative for rash. Neurological: Positive for lightheadedness. Negative for headaches, focal weakness or numbness.  10-point ROS otherwise negative.  ____________________________________________   PHYSICAL EXAM:  VITAL SIGNS: ED Triage Vitals   Enc Vitals Group     BP --      Pulse Rate 05/19/15 0040 56     Resp 05/19/15 0040 19     Temp 05/19/15 0040 98.4 F (36.9 C)     Temp Source 05/19/15 0040 Oral     SpO2 05/19/15 0040 100 %     Weight 05/19/15 0040 164 lb (74.39 kg)     Height 05/19/15 0040 5\' 2"  (1.575 m)     Head Cir --      Peak Flow --      Pain Score 05/19/15 0040 4     Pain Loc --      Pain Edu? --      Excl. in GC? --     Constitutional: Alert and oriented. Well appearing and in no acute distress. Eyes: Conjunctivae are normal. PERRL. EOMI. Head: Atraumatic. Nose: No congestion/rhinnorhea. Mouth/Throat: Mucous membranes are moist.  Oropharynx non-erythematous. Neck: No stridor.  No carotid bruits. Cardiovascular: Bradycardic rate, regular rhythm. Grossly normal heart sounds.  Good peripheral circulation. Respiratory: Normal respiratory effort.  No retractions. Lungs CTAB. Gastrointestinal: Soft and nontender. No distention. No abdominal bruits. No CVA tenderness. Musculoskeletal: No lower extremity tenderness nor edema.  No joint effusions. Neurologic:  Normal speech and language. No gross focal neurologic deficits are appreciated.  Skin:  Skin is warm, dry and intact. No rash noted. Psychiatric: Mood and affect are normal. Speech and behavior are normal.  ____________________________________________   LABS (all labs ordered are listed, but only abnormal results are displayed)  Labs Reviewed  COMPREHENSIVE METABOLIC PANEL - Abnormal; Notable for the following:    Sodium 133 (*)    Chloride 100 (*)    Glucose, Bld 207 (*)    All other components within normal limits  CBC WITH DIFFERENTIAL/PLATELET  TROPONIN I   ____________________________________________  EKG  ED ECG REPORT I, SUNG,JADE J, the attending physician, personally viewed and interpreted this ECG.   Date: 05/19/2015  EKG Time: 0046  Rate: 57  Rhythm: sinus bradycardia  Axis: Normal  Intervals:right bundle branch block   ST&T Change: Nonspecific  ____________________________________________  RADIOLOGY  Portable chest x-ray (viewed by me, interpreted per Dr. Cherly Hensen): Mild vascular congestion and borderline cardiomegaly. Lungs remain grossly clear. ____________________________________________   PROCEDURES  Procedure(s) performed: None  Critical Care performed: No  ____________________________________________   INITIAL IMPRESSION / ASSESSMENT  AND PLAN / ED COURSE  Pertinent labs & imaging results that were available during my care of the patient were reviewed by me and considered in my medical decision making (see chart for details).  70 year old female with an extensive cardiac history who presents with chest pain concerning for unstable angina. She symptomatic bradycardia secondary to initiation of metoprolol. Will infuse IV fluids, administer nitroglycerin and discuss with hospitalist for admission.  ----------------------------------------- 3:36 AM on 05/19/2015 -----------------------------------------  Patient was evaluated by Dr. Elpidio Anis in the emergency department who recommends discharge home (see consult note). Apparently patient tells him she has been experiencing this pain for several years. He has advised her to discontinue metoprolol until seen by Dr. Juliann Pares in 1-2 days. ____________________________________________   FINAL CLINICAL IMPRESSION(S) / ED DIAGNOSES  Final diagnoses:  Chest pain, unspecified chest pain type  Unstable angina (HCC)  Symptomatic bradycardia      Irean Hong, MD 05/19/15 [phone]

## 2015-05-19 NOTE — ED Notes (Signed)
Pt c/o dizziness. MD aware 

## 2015-05-19 NOTE — ED Notes (Signed)
Resting quietly no complaints.

## 2015-05-19 NOTE — ED Notes (Signed)
Pt c/o chest pain intermittent for 2 days; this evening she had pain to upper left chest that radiated to left side of her neck and back to shoulder blades; also states heaviness in left arm; pt awake, alert and oriented x 3; talking in complete coherent sentences; very talkative

## 2015-05-19 NOTE — ED Notes (Signed)
Pt moved to lobby via wheelchair to wait for husband; given warm blanket; introduced to nurse first, Misty StanleyLisa and encouraged to let her know of any needs

## 2015-05-19 NOTE — ED Notes (Signed)
Nitro paste removed; blood pressure 102/84; pt is to be discharged home but has nobody that can come for her at this hour; husband has medical problems and cannot drive at night; pt to rest in room until am when husband can come for her; given crackers to eat; side rails up x 2 with callbell in reach; pt encouraged to call for any needs

## 2015-05-19 NOTE — ED Notes (Signed)
Admitting MD at bedside.

## 2015-05-19 NOTE — ED Notes (Signed)
Pt using callbell; says she has called her husband to come for her and is ready to be discharged

## 2015-10-16 ENCOUNTER — Observation Stay
Admission: EM | Admit: 2015-10-16 | Discharge: 2015-10-17 | Disposition: A | Discharge status: Home / Self Care | Payer: Medicare Other | Attending: Internal Medicine | Admitting: Internal Medicine

## 2015-10-16 ENCOUNTER — Emergency Department: Payer: Medicare Other

## 2015-10-16 ENCOUNTER — Observation Stay: Payer: Medicare Other

## 2015-10-16 ENCOUNTER — Encounter: Payer: Self-pay | Admitting: Occupational Medicine

## 2015-10-16 DIAGNOSIS — I2510 Atherosclerotic heart disease of native coronary artery without angina pectoris: Secondary | ICD-10-CM | POA: Diagnosis not present

## 2015-10-16 DIAGNOSIS — E1151 Type 2 diabetes mellitus with diabetic peripheral angiopathy without gangrene: Secondary | ICD-10-CM | POA: Diagnosis not present

## 2015-10-16 DIAGNOSIS — E039 Hypothyroidism, unspecified: Secondary | ICD-10-CM | POA: Diagnosis not present

## 2015-10-16 DIAGNOSIS — K219 Gastro-esophageal reflux disease without esophagitis: Secondary | ICD-10-CM | POA: Insufficient documentation

## 2015-10-16 DIAGNOSIS — R531 Weakness: Secondary | ICD-10-CM

## 2015-10-16 DIAGNOSIS — I10 Essential (primary) hypertension: Secondary | ICD-10-CM | POA: Insufficient documentation

## 2015-10-16 DIAGNOSIS — R001 Bradycardia, unspecified: Secondary | ICD-10-CM | POA: Diagnosis present

## 2015-10-16 DIAGNOSIS — R079 Chest pain, unspecified: Secondary | ICD-10-CM | POA: Diagnosis not present

## 2015-10-16 DIAGNOSIS — I4510 Unspecified right bundle-branch block: Secondary | ICD-10-CM | POA: Diagnosis not present

## 2015-10-16 DIAGNOSIS — Z881 Allergy status to other antibiotic agents status: Secondary | ICD-10-CM | POA: Diagnosis not present

## 2015-10-16 DIAGNOSIS — D649 Anemia, unspecified: Secondary | ICD-10-CM

## 2015-10-16 DIAGNOSIS — Z7902 Long term (current) use of antithrombotics/antiplatelets: Secondary | ICD-10-CM | POA: Diagnosis not present

## 2015-10-16 DIAGNOSIS — E1165 Type 2 diabetes mellitus with hyperglycemia: Secondary | ICD-10-CM | POA: Diagnosis not present

## 2015-10-16 DIAGNOSIS — Z7982 Long term (current) use of aspirin: Secondary | ICD-10-CM | POA: Insufficient documentation

## 2015-10-16 DIAGNOSIS — Z79899 Other long term (current) drug therapy: Secondary | ICD-10-CM | POA: Diagnosis not present

## 2015-10-16 DIAGNOSIS — G8194 Hemiplegia, unspecified affecting left nondominant side: Secondary | ICD-10-CM | POA: Insufficient documentation

## 2015-10-16 DIAGNOSIS — I959 Hypotension, unspecified: Secondary | ICD-10-CM

## 2015-10-16 DIAGNOSIS — E7800 Pure hypercholesterolemia, unspecified: Secondary | ICD-10-CM | POA: Diagnosis not present

## 2015-10-16 DIAGNOSIS — E11649 Type 2 diabetes mellitus with hypoglycemia without coma: Secondary | ICD-10-CM | POA: Diagnosis not present

## 2015-10-16 DIAGNOSIS — E785 Hyperlipidemia, unspecified: Secondary | ICD-10-CM | POA: Diagnosis not present

## 2015-10-16 DIAGNOSIS — Z794 Long term (current) use of insulin: Secondary | ICD-10-CM | POA: Diagnosis not present

## 2015-10-16 DIAGNOSIS — E871 Hypo-osmolality and hyponatremia: Secondary | ICD-10-CM | POA: Diagnosis not present

## 2015-10-16 DIAGNOSIS — G459 Transient cerebral ischemic attack, unspecified: Secondary | ICD-10-CM

## 2015-10-16 DIAGNOSIS — Z803 Family history of malignant neoplasm of breast: Secondary | ICD-10-CM | POA: Diagnosis not present

## 2015-10-16 DIAGNOSIS — M6289 Other specified disorders of muscle: Secondary | ICD-10-CM | POA: Diagnosis present

## 2015-10-16 DIAGNOSIS — E162 Hypoglycemia, unspecified: Secondary | ICD-10-CM

## 2015-10-16 LAB — BASIC METABOLIC PANEL
ANION GAP: 11 (ref 5–15)
Anion gap: 5 (ref 5–15)
BUN: 9 mg/dL (ref 6–20)
BUN: 8 mg/dL (ref 6–20)
CALCIUM: 7.8 mg/dL — AB (ref 8.9–10.3)
CHLORIDE: 97 mmol/L — AB (ref 101–111)
CHLORIDE: 105 mmol/L (ref 101–111)
CO2: 23 mmol/L (ref 22–32)
CO2: 23 mmol/L (ref 22–32)
CREATININE: 0.69 mg/dL (ref 0.44–1.00)
CREATININE: 0.68 mg/dL (ref 0.44–1.00)
Calcium: 8.9 mg/dL (ref 8.9–10.3)
GFR calc non Af Amer: >60 mL/min (ref >60)
Glucose, Bld: 102 mg/dL — ABNORMAL HIGH (ref 65–99)
Glucose, Bld: 251 mg/dL — ABNORMAL HIGH (ref 65–99)
POTASSIUM: 2.9 mmol/L — AB (ref 3.5–5.1)
Potassium: 4.1 mmol/L (ref 3.5–5.1)
SODIUM: 131 mmol/L — AB (ref 135–145)
SODIUM: 133 mmol/L — AB (ref 135–145)

## 2015-10-16 LAB — HEMOGLOBIN A1C: Hgb A1c MFr Bld: 7.7 % — ABNORMAL HIGH (ref 4.0–6.0)

## 2015-10-16 LAB — CBC
HCT: 37.3 % (ref 35.0–47.0)
HCT: 34.1 % — ABNORMAL LOW (ref 35.0–47.0)
HEMOGLOBIN: 12.6 g/dL (ref 12.0–16.0)
Hemoglobin: 11.5 g/dL — ABNORMAL LOW (ref 12.0–16.0)
MCH: 29.9 pg (ref 26.0–34.0)
MCH: 30.4 pg (ref 26.0–34.0)
MCHC: 33.9 g/dL (ref 32.0–36.0)
MCHC: 33.8 g/dL (ref 32.0–36.0)
MCV: 88.3 fL (ref 80.0–100.0)
MCV: 89.7 fL (ref 80.0–100.0)
PLATELETS: 379 10*3/uL (ref 150–440)
PLATELETS: 308 10*3/uL (ref 150–440)
RBC: 4.23 MIL/uL (ref 3.80–5.20)
RBC: 3.8 MIL/uL (ref 3.80–5.20)
RDW: 12.7 % (ref 11.5–14.5)
RDW: 13 % (ref 11.5–14.5)
WBC: 8.7 10*3/uL (ref 3.6–11.0)
WBC: 4.1 10*3/uL (ref 3.6–11.0)

## 2015-10-16 LAB — CREATININE, SERUM
CREATININE: 0.68 mg/dL (ref 0.44–1.00)
GFR calc Af Amer: >60 mL/min (ref >60)
GFR calc non Af Amer: >60 mL/min (ref >60)

## 2015-10-16 LAB — TROPONIN I
TROPONIN I: 0.03 ng/mL (ref <0.031)
Troponin I: <0.03 ng/mL (ref <0.031)

## 2015-10-16 LAB — URINALYSIS COMPLETE WITH MICROSCOPIC (ARMC ONLY)
Bilirubin Urine: NEGATIVE
GLUCOSE, UA: NEGATIVE mg/dL
Ketones, ur: NEGATIVE mg/dL
LEUKOCYTES UA: NEGATIVE
NITRITE: NEGATIVE
PH: 6 (ref 5.0–8.0)
Protein, ur: NEGATIVE mg/dL
SPECIFIC GRAVITY, URINE: 1.004 — AB (ref 1.005–1.030)
Squamous Epithelial / LPF: NONE SEEN

## 2015-10-16 LAB — GLUCOSE, CAPILLARY
GLUCOSE-CAPILLARY: 74 mg/dL (ref 65–99)
GLUCOSE-CAPILLARY: 213 mg/dL — AB (ref 65–99)
GLUCOSE-CAPILLARY: 158 mg/dL — AB (ref 65–99)

## 2015-10-16 LAB — TSH: TSH: 6.002 u[IU]/mL — AB (ref 0.350–4.500)

## 2015-10-16 LAB — MAGNESIUM: MAGNESIUM: 1.6 mg/dL — AB (ref 1.7–2.4)

## 2015-10-16 MED ORDER — NIACIN ER 500 MG PO CPCR
500.0000 mg | ORAL_CAPSULE | Freq: Every day | ORAL | Status: DC
  Administered 2015-10-16: 500 mg via ORAL
  Filled 2015-10-16 (×2): qty 1

## 2015-10-16 MED ORDER — POTASSIUM CHLORIDE IN NACL 20-0.9 MEQ/L-% IV SOLN
INTRAVENOUS | Status: AC
  Filled 2015-10-16: qty 1000

## 2015-10-16 MED ORDER — DIPHENHYDRAMINE HCL 50 MG/ML IJ SOLN
25.0000 mg | Freq: Once | INTRAMUSCULAR | Status: AC
  Administered 2015-10-16: 25 mg via INTRAVENOUS
  Filled 2015-10-16: qty 1

## 2015-10-16 MED ORDER — DOCUSATE SODIUM 100 MG PO CAPS
100.0000 mg | ORAL_CAPSULE | Freq: Two times a day (BID) | ORAL | Status: DC
  Administered 2015-10-16 – 2015-10-17 (×3): 100 mg via ORAL
  Filled 2015-10-16 (×3): qty 1

## 2015-10-16 MED ORDER — POTASSIUM CHLORIDE 10 MEQ/100ML IV SOLN
10.0000 meq | INTRAVENOUS | Status: AC
  Filled 2015-10-16 (×4): qty 100

## 2015-10-16 MED ORDER — CLOPIDOGREL BISULFATE 75 MG PO TABS
ORAL_TABLET | ORAL | Status: AC
  Administered 2015-10-16: 75 mg via ORAL
  Filled 2015-10-16: qty 1

## 2015-10-16 MED ORDER — INSULIN ASPART 100 UNIT/ML ~~LOC~~ SOLN
3.0000 [IU] | Freq: Three times a day (TID) | SUBCUTANEOUS | Status: DC
  Filled 2015-10-16: qty 3

## 2015-10-16 MED ORDER — ENOXAPARIN SODIUM 40 MG/0.4ML ~~LOC~~ SOLN
40.0000 mg | SUBCUTANEOUS | Status: DC
  Administered 2015-10-16: 40 mg via SUBCUTANEOUS
  Filled 2015-10-16: qty 0.4

## 2015-10-16 MED ORDER — LEVOTHYROXINE SODIUM 25 MCG PO TABS
50.0000 ug | ORAL_TABLET | Freq: Every day | ORAL | Status: DC
  Administered 2015-10-16 – 2015-10-17 (×2): 50 ug via ORAL
  Filled 2015-10-16: qty 1
  Filled 2015-10-16 (×2): qty 2

## 2015-10-16 MED ORDER — SENNA 8.6 MG PO TABS
1.0000 | ORAL_TABLET | Freq: Two times a day (BID) | ORAL | Status: DC
  Administered 2015-10-16 – 2015-10-17 (×3): 8.6 mg via ORAL
  Filled 2015-10-16 (×3): qty 1

## 2015-10-16 MED ORDER — FAMOTIDINE 20 MG PO TABS
20.0000 mg | ORAL_TABLET | Freq: Every day | ORAL | Status: DC
  Administered 2015-10-16 – 2015-10-17 (×2): 20 mg via ORAL
  Filled 2015-10-16 (×2): qty 1

## 2015-10-16 MED ORDER — SODIUM CHLORIDE 0.9% FLUSH
3.0000 mL | Freq: Two times a day (BID) | INTRAVENOUS | Status: DC
  Administered 2015-10-16: 3 mL via INTRAVENOUS

## 2015-10-16 MED ORDER — POTASSIUM CHLORIDE 20 MEQ PO PACK
40.0000 meq | PACK | Freq: Once | ORAL | Status: AC
  Administered 2015-10-16: 40 meq via ORAL
  Filled 2015-10-16: qty 2

## 2015-10-16 MED ORDER — KETOROLAC TROMETHAMINE 30 MG/ML IJ SOLN
30.0000 mg | Freq: Once | INTRAMUSCULAR | Status: AC
  Administered 2015-10-16: 30 mg via INTRAVENOUS
  Filled 2015-10-16: qty 1

## 2015-10-16 MED ORDER — ATORVASTATIN CALCIUM 20 MG PO TABS
40.0000 mg | ORAL_TABLET | Freq: Every day | ORAL | Status: DC
  Administered 2015-10-16 – 2015-10-17 (×2): 40 mg via ORAL
  Filled 2015-10-16 (×3): qty 2

## 2015-10-16 MED ORDER — POTASSIUM CHLORIDE IN NACL 20-0.9 MEQ/L-% IV SOLN
INTRAVENOUS | Status: DC
  Administered 2015-10-16 (×2): via INTRAVENOUS
  Filled 2015-10-16 (×4): qty 1000

## 2015-10-16 MED ORDER — ACETAMINOPHEN 325 MG PO TABS
650.0000 mg | ORAL_TABLET | Freq: Four times a day (QID) | ORAL | Status: DC | PRN
  Administered 2015-10-17: 650 mg via ORAL
  Filled 2015-10-16: qty 2

## 2015-10-16 MED ORDER — ACETAMINOPHEN 650 MG RE SUPP: 650.0000 mg | Freq: Four times a day (QID) | RECTAL | Status: DC | PRN

## 2015-10-16 MED ORDER — SODIUM CHLORIDE 0.9 % IV BOLUS (SEPSIS)
1000.0000 mL | Freq: Once | INTRAVENOUS | Status: AC
  Administered 2015-10-16: 1000 mL via INTRAVENOUS

## 2015-10-16 MED ORDER — ASPIRIN EC 81 MG PO TBEC
81.0000 mg | DELAYED_RELEASE_TABLET | Freq: Every day | ORAL | Status: DC
  Administered 2015-10-16 – 2015-10-17 (×2): 81 mg via ORAL
  Filled 2015-10-16 (×2): qty 1

## 2015-10-16 MED ORDER — CLOPIDOGREL BISULFATE 75 MG PO TABS
75.0000 mg | ORAL_TABLET | Freq: Every day | ORAL | Status: DC
  Administered 2015-10-16 – 2015-10-17 (×2): 75 mg via ORAL
  Filled 2015-10-16: qty 1

## 2015-10-16 MED ORDER — GLIPIZIDE 5 MG PO TABS
5.0000 mg | ORAL_TABLET | Freq: Two times a day (BID) | ORAL | Status: DC
  Administered 2015-10-16 – 2015-10-17 (×3): 5 mg via ORAL
  Filled 2015-10-16 (×3): qty 1

## 2015-10-16 MED ORDER — SODIUM CHLORIDE 0.9 % IV BOLUS (SEPSIS)
500.0000 mL | Freq: Once | INTRAVENOUS | Status: AC
Start: 2015-10-16 — End: 2015-10-16
  Administered 2015-10-16: 500 mL via INTRAVENOUS

## 2015-10-16 MED ORDER — GABAPENTIN 300 MG PO CAPS
300.0000 mg | ORAL_CAPSULE | Freq: Every day | ORAL | Status: DC
  Administered 2015-10-16: 300 mg via ORAL
  Filled 2015-10-16: qty 1

## 2015-10-16 MED ORDER — MAGNESIUM SULFATE 4 GM/100ML IV SOLN
4.0000 g | Freq: Once | INTRAVENOUS | Status: AC
  Administered 2015-10-16: 4 g via INTRAVENOUS
  Filled 2015-10-16: qty 100

## 2015-10-16 MED ORDER — METFORMIN HCL 500 MG PO TABS
500.0000 mg | ORAL_TABLET | Freq: Two times a day (BID) | ORAL | Status: DC
  Administered 2015-10-16 – 2015-10-17 (×3): 500 mg via ORAL
  Filled 2015-10-16 (×3): qty 1

## 2015-10-16 MED ORDER — METOCLOPRAMIDE HCL 5 MG/ML IJ SOLN
10.0000 mg | Freq: Once | INTRAMUSCULAR | Status: AC
  Administered 2015-10-16: 10 mg via INTRAVENOUS
  Filled 2015-10-16: qty 2

## 2015-10-16 MED ORDER — INSULIN ASPART 100 UNIT/ML ~~LOC~~ SOLN
0.0000 [IU] | Freq: Three times a day (TID) | SUBCUTANEOUS | Status: DC
  Filled 2015-10-16: qty 3

## 2015-10-16 NOTE — Evaluation (Addendum)
Physical Therapy Evaluation Patient Details Name: Christine BestShobhna J Zimmerman MRN: [id number] DOB: 08/14/1945 Today's Date: 10/16/2015   History of Present Illness  Christine KidneyShobhna Zimmerman is a 70 y.o. female with a known history of Coronary artery disease, hypothyroidism, gastroesophageal reflux disease, hyperlipidemia, who presents to the hospital with complaints of chest pains and left-sided weakness and numbness. Patient was doing well up until approximately 2 days ago when she started noticing left-sided numbness and weakness on also difficult in walking over the past few days. Also, she is having difficulty with chest pains. Pain is described as left-sided chest pain, sharp, intermittent pain increasing with activity, accompanied by shortness of breath, lightheadedness as well as dizziness, radiating to shoulder blade as well as neck discomfort for 3 minutes and would go away due to activity. Patient felt that she's been losing weight over the past few days she felt weak and presyncopal intermittently, also was having palpitations. She decided to come to emergency room for further evaluation. She tells me that at home her blood pressure was high. Her blood glucose levels was low and she felt shaky and her heart was pounding and she came to the hospital. According to her EMS told her that she had cardiac block, however. No reported by that it emergency room. Unfortunately. In emergency room, patient was noted to be hypotensive as well as high bradycardic with heart rate going as low as 47 intermittently and hospitalist services were contacted for admission. Pt denies any falls in the last 12 months  Clinical Impression  Pt demonstrates mild instability with gait, clearly preferring UE support on IV pole or hand rail. She present with higher level balance deficits with positive Rhomberg and single leg stance of 1-2 seconds on each LE. Encouraged pt to consider OP PT for balance training and fall risk reduction however she  refuses. She has been active [facility] in the past but stopped due to poor motivation. Pt encouraged to resume community-based exercise program. Pt will benefit from skilled PT services to address deficits in strength, balance, and mobility in order to return to full function at home.     Follow Up Recommendations Outpatient PT;Other (comment) (OP PT for balance, pt refuses)    Equipment Recommendations  None recommended by PT    Recommendations for Other Services       Precautions / Restrictions Precautions Precautions: Fall Restrictions Weight Bearing Restrictions: No      Mobility  Bed Mobility Overal bed mobility: Independent             General bed mobility comments: Good speed and sequencing  Transfers Overall transfer level: Independent Equipment used: None             General transfer comment: Pt demonstrates good speed and sequencing with sit to stand transfers. Stable in standing without UE support  Ambulation/Gait Ambulation/Gait assistance: Min guard Ambulation Distance (Feet): 220 Feet Assistive device: Rolling walker (2 wheeled) Gait Pattern/deviations: Step-through pattern Gait velocity: Decreased but functional for household mobility   General Gait Details: Pt ambulates with decreased step length and decreased gait speed. She demonstrates some mild instability and clearly prefers to utilize UE support on IV pole or hand rail. Refuses use of rolling walker in the hospital or following discharge. Vitals monitored and SaO2 remains at or above 95% throughout. HR is around 50 at rest and increases to approximately 60 bpm during ambulation. Denies chest pain. Pt denies DOE or fatigue  Stairs  Wheelchair Mobility    Modified Rankin (Stroke Patients Only)       Balance Overall balance assessment: Needs assistance Sitting-balance support: No upper extremity supported Sitting balance-Leahy Scale: Good      Standing balance support: No upper extremity supported Standing balance-Leahy Scale: Fair Standing balance comment: Positive Rhomberg within 8-10 seconds. Single leg stance approximately 1-2 seconds on each LEs                             Pertinent Vitals/Pain Pain Assessment: No/denies pain    Home Living Family/patient expects to be discharged to:: Private residence Living Arrangements: Spouse/significant other Available Help at Discharge: Family Type of Home: House Home Access: Stairs to enter Entrance Stairs-Rails: Doctor, general practice of Steps: 5 Home Layout: One level Home Equipment: Walker - 2 wheels;Grab bars - tub/shower;Grab bars - toilet (no BSC, no cane, )      Prior Function Level of Independence: Independent         Comments: Pt reports he is independent with ADLs/IADLs. Full community ambulator without assistive device. Drives     Hand Dominance   Dominant Hand: Right    Extremity/Trunk Assessment   Upper Extremity Assessment: Overall WFL for tasks assessed           Lower Extremity Assessment: Overall WFL for tasks assessed         Communication   Communication: No difficulties  Cognition Arousal/Alertness: Awake/alert Behavior During Therapy: WFL for tasks assessed/performed Overall Cognitive Status: Within Functional Limits for tasks assessed                      General Comments      Exercises        Assessment/Plan    PT Assessment Patient needs continued PT services  PT Diagnosis Abnormality of gait;Generalized weakness   PT Problem List Decreased strength;Decreased activity tolerance;Decreased balance;Decreased mobility;Decreased safety awareness  PT Treatment Interventions Gait training;DME instruction;Therapeutic activities;Therapeutic exercise;Balance training;Neuromuscular re-education;Patient/family education   PT Goals (Current goals can be found in the Care Plan section) Acute Rehab PT  Goals Patient Stated Goal: Return home at prior level of function PT Goal Formulation: With patient Time For Goal Achievement: 10/30/15 Potential to Achieve Goals: Good    Frequency Min 2X/week   Barriers to discharge        Co-evaluation               End of Session Equipment Utilized During Treatment: Gait belt Activity Tolerance: Patient tolerated treatment well Patient left: in chair;with call bell/phone within reach;Other (comment) (No chair alarm box in room, CNA notified) Nurse Communication: Other (comment) (CNA notified of no alarm in room)    Functional Assessment Tool Used: clinical judgement Functional Limitation: Mobility: Walking and moving around Mobility: Walking and Moving Around Current Status (Z6109): At least 1 percent but less than 20 percent impaired, limited or restricted Mobility: Walking and Moving Around Goal Status [phone]): At least 1 percent but less than 20 percent impaired, limited or restricted    Time: 1413-1444 PT Time Calculation (min) (ACUTE ONLY): 31 min   Charges:   PT Evaluation $PT Eval Moderate Complexity: 1 Procedure PT Treatments $Gait Training: 8-22 mins   PT G Codes:   PT G-Codes **NOT FOR INPATIENT CLASS** Functional Assessment Tool Used: clinical judgement Functional Limitation: Mobility: Walking and moving around Mobility: Walking and Moving Around Current Status (U9811): At least 1 percent but less  than 20 percent impaired, limited or restricted Mobility: Walking and Moving Around Goal Status [phone]): At least 1 percent but less than 20 percent impaired, limited or restricted   Lynnea Maizes PT, DPT   Huprich,Jason 10/16/2015, 4:35 PM

## 2015-10-16 NOTE — ED Notes (Signed)
Report given to Soumon RN.  Pt taken to MRI at this time.

## 2015-10-16 NOTE — Progress Notes (Signed)
Patient admitted to unit from ED for chest [pain weakness and headache, patient alert and oriented denies any pain at this time, vss,, iv fluid potasium at 75 ml per hr.

## 2015-10-16 NOTE — ED Notes (Addendum)
Patient presents to Emergency Department via EMS. Pt with complaints of low bs 70 , htn 168/70 , weakness shaky, ha worse they the cp, cp left upper chest radiating to shoudler. EMS report BS 109 BP 153/82. Pt states took ASA 81mg  times 2 and 2 nitro sprays. CP improved some.

## 2015-10-16 NOTE — H&P (Addendum)
[facility] [place] [facility]   PATIENT NAME: Christine Zimmerman    MR#:  [id number]  DATE OF BIRTH:  01/22/1946  DATE OF ADMISSION:  10/16/2015  PRIMARY CARE PHYSICIAN: No primary care provider on file.   REQUESTING/REFERRING PHYSICIAN:   CHIEF COMPLAINT:   Chief Complaint  Patient presents with  . Chest Pain    left side chest and numbness to head  . Hypoglycemia    70  . Hypertension    168/70  . Weakness  . Headache    HISTORY OF PRESENT ILLNESS: Christine KidneyShobhna Heatley  is a 70 y.o. female with a known history of Coronary artery disease, hypothyroidism, gastroesophageal reflux disease, hyperlipidemia, who presents to the hospital with complaints of chest pains and left-sided weakness and numbness. Patient was doing well up until approximately 2 days ago when she started noticing left-sided numbness and weakness on also difficult in walking over the past few days. Also, she is having difficulty with chest pains. Pain is described as left-sided chest pain, sharp, intermittent pain increasing with activity, accompanied by shortness of breath, lightheadedness as well as dizziness, radiating to shoulder blade as well as neck discomfort for 3 minutes and would go away due to activity. Patient felt that she's been losing weight over the past few days she felt weak and presyncopal intermittently, also was having palpitations. She decided to come to emergency room for further evaluation. She tells me that at home her blood pressure was high. Her blood glucose levels was low and she felt shaky and her heart was pounding and she came to the hospital. According to her EMS told her that she had cardiac block, however. No reported by that it emergency room. Unfortunately. In emergency room, patient was noted to be hypotensive as well as high bradycardic with heart rate going as low as 47 intermittently and hospitalist services were contacted for admission.   PAST MEDICAL HISTORY:    Past Medical History  Diagnosis Date  . Diabetes mellitus without complication (HCC)   . Hypertension   . Coronary artery disease   . Hypothyroidism   . GERD (gastroesophageal reflux disease)   . High cholesterol     PAST SURGICAL HISTORY: Past Surgical History  Procedure Laterality Date  . Back surgery      SOCIAL HISTORY:  Social History  Substance Use Topics  . Smoking status: Never Smoker   . Smokeless tobacco: Not on file  . Alcohol Use: No    FAMILY HISTORY:  Family History  Problem Relation Age of Onset  . Breast cancer Neg Hx     DRUG ALLERGIES:  Allergies  Allergen Reactions  . Biaxin [Clarithromycin] Hives    Review of Systems  Constitutional: Positive for weight loss and malaise/fatigue. Negative for fever and chills.  HENT: Negative for congestion.   Eyes: Negative for blurred vision and double vision.  Respiratory: Positive for shortness of breath. Negative for cough, sputum production and wheezing.   Cardiovascular: Positive for chest pain and palpitations. Negative for orthopnea, leg swelling and PND.  Gastrointestinal: Positive for constipation. Negative for nausea, vomiting, abdominal pain, diarrhea, blood in stool and melena.  Genitourinary: Negative for dysuria, urgency, frequency and hematuria.  Musculoskeletal: Negative for falls.  Skin: Negative for rash.  Neurological: Positive for tingling, tremors, sensory change, focal weakness and weakness. Negative for dizziness.  Psychiatric/Behavioral: Negative for depression and memory loss. The patient is not nervous/anxious.     MEDICATIONS AT HOME:  Prior to  Admission medications   Medication Sig Start Date End Date Taking? Authorizing Provider  amLODipine (NORVASC) 5 MG tablet Take 5 mg by mouth daily.   Yes Historical Provider, MD  aspirin EC 81 MG tablet Take 81 mg by mouth daily.   Yes Historical Provider, MD  atorvastatin (LIPITOR) 40 MG tablet Take 40 mg by mouth every evening.   Yes  Historical Provider, MD  calcium-vitamin D (OSCAL 500/200 D-3) 500-200 MG-UNIT tablet Take 1 tablet by mouth daily.   Yes Historical Provider, MD  clopidogrel (PLAVIX) 75 MG tablet Take 75 mg by mouth daily.   Yes Historical Provider, MD  cyanocobalamin (,VITAMIN B-12,) 1000 MCG/ML injection Inject 1,000 mcg into the muscle every 30 (thirty) days.   Yes Historical Provider, MD  ferrous sulfate 325 (65 FE) MG tablet Take 325 mg by mouth daily with breakfast.   Yes Historical Provider, MD  gabapentin (NEURONTIN) 300 MG capsule Take 300 mg by mouth at bedtime.   Yes Historical Provider, MD  glipiZIDE (GLUCOTROL) 5 MG tablet Take 5 mg by mouth daily. Will take 2.5mg  if blood sugar is low   Yes Historical Provider, MD  hydrochlorothiazide (HYDRODIURIL) 25 MG tablet Take 25 mg by mouth daily.   Yes Historical Provider, MD  isosorbide mononitrate (IMDUR) 30 MG 24 hr tablet Take 30 mg by mouth daily.   Yes Historical Provider, MD  levothyroxine (SYNTHROID, LEVOTHROID) 50 MCG tablet Take 50 mcg by mouth daily.   Yes Historical Provider, MD  lisinopril (PRINIVIL,ZESTRIL) 10 MG tablet Take 10 mg by mouth daily.   Yes Historical Provider, MD  metFORMIN (GLUCOPHAGE) 1000 MG tablet Take 1,000 mg by mouth 2 (two) times daily.   Yes Historical Provider, MD  niacin 500 MG tablet Take 500 mg by mouth daily.   Yes Historical Provider, MD  nitroGLYCERIN (NITROLINGUAL) 0.4 MG/SPRAY spray Place 1 spray under the tongue every 5 (five) minutes x 3 doses as needed for chest pain.   Yes Historical Provider, MD  ranitidine (ZANTAC) 150 MG tablet Take 150 mg by mouth 2 (two) times daily.   Yes Historical Provider, MD      PHYSICAL EXAMINATION:   VITAL SIGNS: Blood pressure 96/51, pulse 59, temperature 97.9 F (36.6 C), temperature source Oral, resp. rate 20, height  (1.575 m), weight 79.833 kg (176 lb), SpO2 96 %.  GENERAL:  70 y.o.-year-old patient lying in the bed with no acute distress. There is sleepy and  somewhat slurring speech and having difficulty keeping her up, apparently patient was given Benadryl and emergency room for unknown reason EYES: Pupils equal, round, reactive to light and accommodation. No scleral icterus. Extraocular muscles intact.  HEENT: Head atraumatic, normocephalic. Oropharynx and nasopharynx clear.  NECK:  Supple, no jugular venous distention. No thyroid enlargement, no tenderness.  LUNGS: Normal breath sounds bilaterally, no wheezing, rales,rhonchi or crepitation. No use of accessory muscles of respiration.  CARDIOVASCULAR: S1, S2 , bradycardic, distant. No murmurs, rubs, or gallops.  ABDOMEN: Soft, nontender, nondistended. Bowel sounds present. No organomegaly or mass.  EXTREMITIES: No pedal edema, cyanosis, or clubbing.  NEUROLOGIC: Cranial nerves II through XII are intact. Muscle strength 5/5 in all extremities. Sensation intact. Gait not checked. I cannot appreciate any significant weakness on the left side PSYCHIATRIC: The patient is somnolent, however, able to wake up and converses, then drifts back to sleep and oriented x 3.  SKIN: No obvious rash, lesion, or ulcer.   LABORATORY PANEL:   CBC  Recent Labs Lab 10/16/15  0237  WBC 8.7  HGB 12.6  HCT 37.3  PLT 379  MCV 88.3  MCH 29.9  MCHC 33.9  RDW 12.7   ------------------------------------------------------------------------------------------------------------------  Chemistries   Recent Labs Lab 10/16/15 0237  NA 131*  K 2.9*  CL 97*  CO2 23  GLUCOSE 102*  BUN 9  CREATININE 0.69  CALCIUM 8.9   ------------------------------------------------------------------------------------------------------------------  Cardiac Enzymes  Recent Labs Lab 10/16/15 0237 10/16/15 0557  TROPONINI <0.03 <0.03   ------------------------------------------------------------------------------------------------------------------  RADIOLOGY: Dg Chest 2 View  10/16/2015  CLINICAL DATA:  Acute onset of  left upper chest pain, generalized weakness and headache. Shortness of breath. Initial encounter. EXAM: CHEST  2 VIEW COMPARISON:  Chest radiograph performed 05/19/2015 FINDINGS: The lungs are well-aerated. Minimal left basilar atelectasis is noted. Mild peribronchial thickening is seen. There is no evidence of pleural effusion or pneumothorax. The heart is borderline normal in size. No acute osseous abnormalities are seen. IMPRESSION: Minimal left basilar atelectasis noted. Mild peribronchial thickening seen. Electronically Signed   By: Roanna Raider M.D.   On: 10/16/2015 03:15   Ct Head Wo Contrast  10/16/2015  CLINICAL DATA:  Patient with low blood sugar, finger numbness, chest pain, headache and leg pain. EXAM: CT HEAD WITHOUT CONTRAST TECHNIQUE: Contiguous axial images were obtained from the base of the skull through the vertex without intravenous contrast. COMPARISON:  None. FINDINGS: Ventricles and sulci are appropriate for patient's age. No evidence for acute cortically based infarct, intracranial hemorrhage, mass lesion or mass-effect. Orbits are unremarkable. Paranasal sinuses are well aerated. Mastoid air cells unremarkable. Calvarium is intact. IMPRESSION: No acute intracranial process Electronically Signed   By: Annia Belt M.D.   On: 10/16/2015 07:32    EKG: Orders placed or performed during the hospital encounter of 10/16/15  . EKG 12-Lead  . EKG 12-Lead  . ED EKG within 10 minutes  . ED EKG within 10 minutes  . EKG 12-Lead  . EKG 12-Lead   EKG in the emergency room reveals sinus bradycardia at 47 bpm, right bundle branch block, nonspecific ST-T changes IMPRESSION AND PLAN:  Active Problems:   Chest pain   Left-sided weakness   Hypotension   Bradycardia   Hypoglycemia #1. Chest pain. Admits that patient medical floor. Continue aspirin, Plavix, Lovenox, check cardiac enzymes 3, good cardiologist involved for further recommendations. Unable to use nitroglycerin or beta blockers  due to bradycardia and hypotension #2. Left-sided weakness, get MRI of the brain, continue aspirin as well as Plavix, get physical therapist evaluation #3. Hypotension, continue IV fluids at lower rate, holding all blood pressure medications #4. Bradycardia, get TSH, hold beta blockers, follow patient's heart rate closely  on telemetry #5. Hypoglycemia, hold all diabetic medications, get hemoglobin A1c, continue sliding scale insulin, diabetic diet  All the records are reviewed and case discussed with ED provider. Management plans discussed with the patient, family and they are in agreement.  CODE STATUS: Code Status History    This patient does not have a recorded code status. Please follow your organizational policy for patients in this situation.       TOTAL TIME TAKING CARE OF THIS PATIENT:50 minutes.    Katharina Caper M.D on 10/16/2015 at 8:18 AM  Between 7am to 6pm - Pager - [phone] After 6pm go to [email or web address] - password [email or web address] [facility]  [facility] [facility]  Office  [phone]  CC: Primary care physician; No primary care provider on file.

## 2015-10-16 NOTE — Consult Note (Signed)
[facility] Consultation [facility]  Patient ID: Christine Zimmerman, MRN: [id number], DOB/AGE: January 09, 1946 70 y.o. Admit date: 10/16/2015   Date of Consult: 10/16/2015 Primary Physician: No primary care provider on file. Primary Cardiologist:[place]  Chief Complaint:  Chief Complaint  Patient presents with  . Chest Pain    left side chest and numbness to head  . Hypoglycemia    70  . Hypertension    168/70  . Weakness  . Headache   Reason for Consult: HPI    Chest Pain   Additional comments: left side chest and numbness to head        Hypoglycemia   Additional comments: 70        Hypertension   Additional comments: 168/70     Last edited by Dagoberto Ligas, RN on 10/16/2015  2:27 AM. (History)    Chest pain shortness of breath with bradycardia  HPI: 70 y.o. female with known coronary artery disease status post previous stenting of the and essential hypertension mixed hyperlipidemia and diabetes with complications. The patient has been on appropriate medication management for hyperlipidemia hypertension and cardiovascular disease as well as further risk for prevention. The patient's done fairly well until recently when she had some significant shortness of breath and chest discomfort and an uneasy feeling. She was seen in the emergency room with the EKG showing sinus bradycardia and no evidence of heart block. There is no other EKG changes. Troponin has since been normal. Since then she has not had any other significant symptoms or concerns while admitted to the hospital. The patient has not ambulated at this time. Telemetry shows periods of sinus bradycardia O evidence of heart block. The patient did have a stress test in January showing normal myocardial perfusion and an echocardiogram prior to that showing normal LV systolic function  Past Medical History  Diagnosis Date  . Diabetes mellitus without complication (HCC)   . Hypertension   . Coronary artery disease   .  Hypothyroidism   . GERD (gastroesophageal reflux disease)   . High cholesterol       Surgical History:  Past Surgical History  Procedure Laterality Date  . Back surgery       Home Meds: Prior to Admission medications   Medication Sig Start Date End Date Taking? Authorizing Provider  amLODipine (NORVASC) 5 MG tablet Take 5 mg by mouth daily.   Yes Historical Provider, MD  aspirin EC 81 MG tablet Take 81 mg by mouth daily.   Yes Historical Provider, MD  atorvastatin (LIPITOR) 80 MG tablet Take 80 mg by mouth daily.   Yes Historical Provider, MD  calcium-vitamin D (OSCAL 500/200 D-3) 500-200 MG-UNIT tablet Take 1 tablet by mouth daily.   Yes Historical Provider, MD  clopidogrel (PLAVIX) 75 MG tablet Take 75 mg by mouth daily.   Yes Historical Provider, MD  cyanocobalamin (,VITAMIN B-12,) 1000 MCG/ML injection Inject 1,000 mcg into the muscle every 30 (thirty) days.   Yes Historical Provider, MD  ferrous sulfate 325 (65 FE) MG tablet Take 325 mg by mouth daily with breakfast.   Yes Historical Provider, MD  gabapentin (NEURONTIN) 300 MG capsule Take 300 mg by mouth at bedtime.   Yes Historical Provider, MD  glipiZIDE (GLUCOTROL) 10 MG tablet Take 10 mg by mouth 2 (two) times daily before a meal.   Yes Historical Provider, MD  hydrochlorothiazide (HYDRODIURIL) 25 MG tablet Take 25 mg by mouth daily.   Yes Historical Provider, MD  isosorbide  mononitrate (IMDUR) 30 MG 24 hr tablet Take 30 mg by mouth daily.   Yes Historical Provider, MD  levothyroxine (SYNTHROID, LEVOTHROID) 75 MCG tablet Take 75 mcg by mouth daily before breakfast.   Yes Historical Provider, MD  lisinopril (PRINIVIL,ZESTRIL) 10 MG tablet Take 10 mg by mouth daily.   Yes Historical Provider, MD  metFORMIN (GLUCOPHAGE) 1000 MG tablet Take 1,000 mg by mouth 2 (two) times daily.   Yes Historical Provider, MD  niacin 500 MG tablet Take 500 mg by mouth daily.   Yes Historical Provider, MD  nitroGLYCERIN (NITROLINGUAL) 0.4 MG/SPRAY  spray Place 1 spray under the tongue every 5 (five) minutes x 3 doses as needed for chest pain.   Yes Historical Provider, MD  ranitidine (ZANTAC) 150 MG tablet Take 150 mg by mouth 2 (two) times daily.   Yes Historical Provider, MD    Inpatient Medications:  . aspirin EC  81 mg Oral Daily  . atorvastatin  40 mg Oral q1800  . clopidogrel  75 mg Oral Daily  . docusate sodium  100 mg Oral BID  . enoxaparin (LOVENOX) injection  40 mg Subcutaneous Q24H  . famotidine  20 mg Oral Daily  . gabapentin  300 mg Oral QHS  . insulin aspart  0-9 Units Subcutaneous TID WC  . insulin aspart  3 Units Subcutaneous TID WC  . levothyroxine  50 mcg Oral QAC breakfast  . niacin  500 mg Oral QHS  . potassium chloride  10 mEq Intravenous Q1 Hr x 4  . senna  1 tablet Oral BID  . sodium chloride flush  3 mL Intravenous Q12H   . 0.9 % NaCl with KCl 20 mEq / L 75 mL/hr at 10/16/15 0853    Allergies:  Allergies  Allergen Reactions  . Biaxin [Clarithromycin] Hives    Social History   Social History  . Marital Status: Married    Spouse Name: N/A  . Number of Children: N/A  . Years of Education: N/A   Occupational History  . Not on file.   Social History Main Topics  . Smoking status: Never Smoker   . Smokeless tobacco: Not on file  . Alcohol Use: No  . Drug Use: No  . Sexual Activity: Not on file   Other Topics Concern  . Not on file   Social History Narrative     Family History  Problem Relation Age of Onset  . Breast cancer Neg Hx      Review of Systems Positive forShortness of breath chest pain Negative for: General:  chills, fever, night sweats or weight changes.  Cardiovascular: PND orthopnea syncope dizziness  Dermatological skin lesions rashes Respiratory: Cough congestion Urologic: Frequent urination urination at night and hematuria Abdominal: negative for nausea, vomiting, diarrhea, bright red blood per rectum, melena, or hematemesis Neurologic: negative for visual  changes, and/or hearing changes  All other systems reviewed and are otherwise negative except as noted above.  Labs:  Recent Labs  10/16/15 0237 10/16/15 0557  TROPONINI <0.03 <0.03   Lab Results  Component Value Date   WBC 8.7 10/16/2015   HGB 12.6 10/16/2015   HCT 37.3 10/16/2015   MCV 88.3 10/16/2015   PLT 379 10/16/2015    Recent Labs Lab 10/16/15 0237  NA 131*  K 2.9*  CL 97*  CO2 23  BUN 9  CREATININE 0.69  CALCIUM 8.9  GLUCOSE 102*   No results found for: CHOL, HDL, LDLCALC, TRIG No results found for: DDIMER  Radiology/Studies:  Dg Chest 2 View  10/16/2015  CLINICAL DATA:  Acute onset of left upper chest pain, generalized weakness and headache. Shortness of breath. Initial encounter. EXAM: CHEST  2 VIEW COMPARISON:  Chest radiograph performed 05/19/2015 FINDINGS: The lungs are well-aerated. Minimal left basilar atelectasis is noted. Mild peribronchial thickening is seen. There is no evidence of pleural effusion or pneumothorax. The heart is borderline normal in size. No acute osseous abnormalities are seen. IMPRESSION: Minimal left basilar atelectasis noted. Mild peribronchial thickening seen. Electronically Signed   By: Roanna Raider M.D.   On: 10/16/2015 03:15   Ct Head Wo Contrast  10/16/2015  CLINICAL DATA:  Patient with low blood sugar, finger numbness, chest pain, headache and leg pain. EXAM: CT HEAD WITHOUT CONTRAST TECHNIQUE: Contiguous axial images were obtained from the base of the skull through the vertex without intravenous contrast. COMPARISON:  None. FINDINGS: Ventricles and sulci are appropriate for patient's age. No evidence for acute cortically based infarct, intracranial hemorrhage, mass lesion or mass-effect. Orbits are unremarkable. Paranasal sinuses are well aerated. Mastoid air cells unremarkable. Calvarium is intact. IMPRESSION: No acute intracranial process Electronically Signed   By: Annia Belt M.D.   On: 10/16/2015 07:32   Mr Brain Wo  Contrast  10/16/2015  CLINICAL DATA:  Left-sided weakness.  Difficulty walking. EXAM: MRI HEAD WITHOUT CONTRAST TECHNIQUE: Multiplanar, multiecho pulse sequences of the brain and surrounding structures were obtained without intravenous contrast. COMPARISON:  CT 10/16/2015, MRI 10/09/2014 FINDINGS: Ventricle size normal.  Cerebral volume normal. Negative for acute or chronic infarction. Negative for hemorrhage.  No fluid collection. Negative for mass or edema.  No shift of the midline structures. Pituitary and skull base normal.  Circle of  Willis patent. Bilateral lens replacement. No orbital mass lesion. Paranasal sinuses clear. IMPRESSION: No significant abnormality. Electronically Signed   By: Marlan Palau M.D.   On: 10/16/2015 11:06    EKG: Sinus bradycardia  Weights: Filed Weights   10/16/15 0230  Weight: 176 lb (79.833 kg)     Physical Exam: Blood pressure 124/56, pulse 46, temperature 98 F (36.7 C), temperature source Oral, resp. rate 18, height 5\' 2"  (1.575 m), weight 176 lb (79.833 kg), SpO2 100 %. Body mass index is 32.18 kg/(m^2). General: Well developed, well nourished, in no acute distress. Head eyes ears nose throat: Normocephalic, atraumatic, sclera non-icteric, no xanthomas, nares are without discharge. No apparent thyromegaly and/or mass  Lungs: Normal respiratory effort.  no wheezes, no rales, no rhonchi.  Heart: RRR with normal S1 S2. no murmur gallop, no rub, PMI is normal size and placement, carotid upstroke normal without bruit, jugular venous pressure is normal Abdomen: Soft, non-tender, non-distended with normoactive bowel sounds. No hepatomegaly. No rebound/guarding. No obvious abdominal masses. Abdominal aorta is normal size without bruit Extremities: No edema. no cyanosis, no clubbing, no ulcers  Peripheral : 2+ bilateral upper extremity pulses, 2+ bilateral femoral pulses, 2+ bilateral dorsal pedal pulse Neuro: Alert and oriented. No facial asymmetry. No focal  deficit. Moves all extremities spontaneously. Musculoskeletal: Normal muscle tone without kyphosis Psych:  Responds to questions appropriately with a normal affect.    Assessment: 70 year old with known previous peripheral vascular disease coronary artery disease status post previous stenting essential hypertension diabetes with complication hyperlipidemia with chest pain and shortness of breath with no evidence of myocardial infarction at this time or heart failure and incidental finding of sinus bradycardia with no evidence of heart block  Plan: 1. Begin ambulation and follow for worsening rhythm  disturbances telemetry changes requiring further intervention 2. Avoid beta blocker or other medication management the can cause bradycardia 3. Continue hypertension medication management as before including amlodipine 4. Antiplatelet medication management for further risk reduction cardiovascular disease 5. Possible discharged home if ambulating well for further follow-up with possible outpatient Holter monitor walking treadmill test for assessment of chronotropic incompetence and/or myocardial ischemia  Signed, Lamar BlinksKOWALSKI,Sonika Levins J M.D. [facility] 10/16/2015, 1:13 PM

## 2015-10-16 NOTE — ED Notes (Signed)
Pt back from CT

## 2015-10-16 NOTE — ED Notes (Signed)
Patient transported to CT 

## 2015-10-16 NOTE — ED Provider Notes (Addendum)
[facility] Emergency Department Provider Note   ____________________________________________  Time seen: Approximately 325 AM  I have reviewed the triage vital signs and the nursing notes.   HISTORY  Chief Complaint Chest Pain; Hypoglycemia; Hypertension; Weakness; and Headache    HPI Christine Zimmerman is a 70 y.o. female who comes into the hospital today with some low blood sugar. She reports that she was feeling some numbness in her fingers she checked her sugar and it was 70. She reports that she's also had some leg weakness for the past couple of days and constant finger numbness. The patient reports some chest pain. She has a headache she reports of present in the nitroglycerin for the symptoms. She reports that her pain is a 6 out of 10 in intensity reports that she's not having some chest pain. The patient denies any abdominal pain but endorses some shortness of breath and dizziness and shakiness. She's had not had any nausea vomiting or diarrhea she's not had a cough or runny nose. The patient is here for evaluation and treatment of her symptoms.   Past Medical History  Diagnosis Date  . Diabetes mellitus without complication (HCC)   . Hypertension   . Coronary artery disease   . Hypothyroidism   . GERD (gastroesophageal reflux disease)   . High cholesterol     There are no active problems to display for this patient.   Past Surgical History  Procedure Laterality Date  . Back surgery      Current Outpatient Rx  Name  Route  Sig  Dispense  Refill  . aspirin EC 81 MG tablet   Oral   Take 81 mg by mouth daily.         Marland Kitchen atorvastatin (LIPITOR) 40 MG tablet   Oral   Take 40 mg by mouth every evening.         . calcium-vitamin D (OSCAL 500/200 D-3) 500-200 MG-UNIT tablet   Oral   Take 1 tablet by mouth daily.         . clopidogrel (PLAVIX) 75 MG tablet   Oral   Take 75 mg by mouth daily.         . cyanocobalamin (,VITAMIN  B-12,) 1000 MCG/ML injection   Intramuscular   Inject 1,000 mcg into the muscle every 30 (thirty) days.         Marland Kitchen glipiZIDE (GLUCOTROL) 5 MG tablet   Oral   Take 5 mg by mouth daily. Will take 2.5mg  if blood sugar is low         . hydrochlorothiazide (HYDRODIURIL) 25 MG tablet   Oral   Take 25 mg by mouth daily.         . isosorbide mononitrate (IMDUR) 30 MG 24 hr tablet   Oral   Take 30 mg by mouth daily.         Marland Kitchen levothyroxine (SYNTHROID, LEVOTHROID) 50 MCG tablet   Oral   Take 50 mcg by mouth daily.         Marland Kitchen lisinopril (PRINIVIL,ZESTRIL) 10 MG tablet   Oral   Take 10 mg by mouth daily.         . metFORMIN (GLUCOPHAGE) 1000 MG tablet   Oral   Take 1,000 mg by mouth 2 (two) times daily.         . metoprolol tartrate (LOPRESSOR) 25 MG tablet   Oral   Take 25 mg by mouth 2 (two) times daily.         Marland Kitchen  niacin 500 MG tablet   Oral   Take 500 mg by mouth daily.         . nitroGLYCERIN (NITROLINGUAL) 0.4 MG/SPRAY spray   Sublingual   Place 1 spray under the tongue every 5 (five) minutes x 3 doses as needed for chest pain.         . ranitidine (ZANTAC) 150 MG tablet   Oral   Take 150 mg by mouth 2 (two) times daily.           Allergies Biaxin  Family History  Problem Relation Age of Onset  . Breast cancer Neg Hx     Social History Social History  Substance Use Topics  . Smoking status: Never Smoker   . Smokeless tobacco: None  . Alcohol Use: No    Review of Systems Constitutional: No fever/chills Eyes: No visual changes. ENT: No sore throat. Cardiovascular: chest pain. Respiratory:  shortness of breath. Gastrointestinal: No abdominal pain.  No nausea, no vomiting.  No diarrhea.  No constipation. Genitourinary: Negative for dysuria. Musculoskeletal: Negative for back pain. Skin: Negative for rash. Neurological: Generalized weakness, headache  10-point ROS otherwise  negative.  ____________________________________________   PHYSICAL EXAM:  VITAL SIGNS: ED Triage Vitals  Enc Vitals Group     BP 10/16/15 0230 102/58 mmHg     Pulse Rate 10/16/15 0230 69     Resp 10/16/15 0230 18     Temp 10/16/15 0230 97.9 F (36.6 C)     Temp Source 10/16/15 0230 Oral     SpO2 10/16/15 0222 98 %     Weight 10/16/15 0230 176 lb (79.833 kg)     Height 10/16/15 0230 5\' 2"  (1.575 m)     Head Cir --      Peak Flow --      Pain Score 10/16/15 0231 7     Pain Loc --      Pain Edu? --      Excl. in GC? --     Constitutional: Alert and oriented. Well appearing and in no Moderate distress. Eyes: Conjunctivae are normal. PERRL. EOMI. Head: Atraumatic. Nose: No congestion/rhinnorhea. Mouth/Throat: Mucous membranes are moist.  Oropharynx non-erythematous. Cardiovascular: Normal rate, regular rhythm. Grossly normal heart sounds.  Good peripheral circulation. Respiratory: Normal respiratory effort.  No retractions. Lungs CTAB. Gastrointestinal: Soft and nontender. No distention. Positive bowel sounds Musculoskeletal: No lower extremity tenderness nor edema.   Neurologic:  Normal speech and language.  Skin:  Skin is warm, dry and intact. Marland Kitchen. Psychiatric: Mood and affect are normal.   ____________________________________________   LABS (all labs ordered are listed, but only abnormal results are displayed)  Labs Reviewed  BASIC METABOLIC PANEL - Abnormal; Notable for the following:    Sodium 131 (*)    Potassium 2.9 (*)    Chloride 97 (*)    Glucose, Bld 102 (*)    All other components within normal limits  URINALYSIS COMPLETEWITH MICROSCOPIC ([facility] ONLY) - Abnormal; Notable for the following:    Color, Urine STRAW (*)    APPearance CLEAR (*)    Specific Gravity, Urine 1.004 (*)    Hgb urine dipstick 1+ (*)    Bacteria, UA RARE (*)    All other components within normal limits  CBC  TROPONIN I  TROPONIN I    ____________________________________________  EKG  ED ECG REPORT I, Rebecka ApleyWebster,  Jacolyn Joaquin P, the attending physician, personally viewed and interpreted this ECG.   Date: 10/16/2015  EKG Time: 225  Rate:  65  Rhythm: normal sinus rhythm, RBBB  Axis: normal  Intervals:right bundle branch block  ST&T Change: none  ____________________________________________  RADIOLOGY  CXR: Minimal left basilar atelectasis noted, mild peribronchial thickening seen.  CT head: No acute intracranial process ____________________________________________   PROCEDURES  Procedure(s) performed: None  Critical Care performed: No  ____________________________________________   INITIAL IMPRESSION / ASSESSMENT AND PLAN / ED COURSE  Pertinent labs & imaging results that were available during my care of the patient were reviewed by me and considered in my medical decision making (see chart for details).  This is a 70 year old female who comes into the hospital today with some low blood sugar chest pain and shortness of breath. The patient's repeat blood sugar was normal the patient's blood work is also unremarkable. I will monitor the patient. I'll give her some fluids with some Reglan and Benadryl for her headache. She will be reassessed.  Wall the patient's repeat troponin is negative the patient does have some bradycardia. She also had some symptomatic bradycardia. She's had a few episodes of blood pressures in the 80s and heart rate in the 40s. I will do a CT scan of the patient's head and I will admit the patient to the hospital for observation. The patient's CT scan is unremarkable. She will be admitted to the hospital for further evaluation. ____________________________________________   FINAL CLINICAL IMPRESSION(S) / ED DIAGNOSES  Final diagnoses:  Weakness  Bradycardia      NEW MEDICATIONS STARTED DURING THIS VISIT:  New Prescriptions   No medications on file     Note:  This  document was prepared using Dragon voice recognition software and may include unintentional dictation errors.    Rebecka Apley, MD 10/16/15 1610  Rebecka Apley, MD 10/16/15 [phone]

## 2015-10-16 NOTE — ED Notes (Signed)
MD aware of low BP and finshing NS 500 bolus. Pts HR dropped MD aware EKG done. Pain coming back MD ordering IVP pain meds.

## 2015-10-17 ENCOUNTER — Observation Stay: Payer: Medicare Other

## 2015-10-17 DIAGNOSIS — G459 Transient cerebral ischemic attack, unspecified: Secondary | ICD-10-CM

## 2015-10-17 DIAGNOSIS — E871 Hypo-osmolality and hyponatremia: Secondary | ICD-10-CM

## 2015-10-17 DIAGNOSIS — D649 Anemia, unspecified: Secondary | ICD-10-CM

## 2015-10-17 LAB — CBC
HEMATOCRIT: 34.6 % — AB (ref 35.0–47.0)
HEMOGLOBIN: 11.9 g/dL — AB (ref 12.0–16.0)
MCH: 30.3 pg (ref 26.0–34.0)
MCHC: 34.2 g/dL (ref 32.0–36.0)
MCV: 88.5 fL (ref 80.0–100.0)
Platelets: 306 10*3/uL (ref 150–440)
RBC: 3.91 MIL/uL (ref 3.80–5.20)
RDW: 13.1 % (ref 11.5–14.5)
WBC: 5.4 10*3/uL (ref 3.6–11.0)

## 2015-10-17 LAB — TSH: TSH: 4.212 u[IU]/mL (ref 0.350–4.500)

## 2015-10-17 LAB — GLUCOSE, CAPILLARY
Glucose-Capillary: 129 mg/dL — ABNORMAL HIGH (ref 65–99)
Glucose-Capillary: 248 mg/dL — ABNORMAL HIGH (ref 65–99)

## 2015-10-17 LAB — BASIC METABOLIC PANEL
ANION GAP: 5 (ref 5–15)
BUN: 8 mg/dL (ref 6–20)
CO2: 22 mmol/L (ref 22–32)
Calcium: 8.4 mg/dL — ABNORMAL LOW (ref 8.9–10.3)
Chloride: 111 mmol/L (ref 101–111)
Creatinine, Ser: 0.65 mg/dL (ref 0.44–1.00)
GFR calc Af Amer: >60 mL/min (ref >60)
GFR calc non Af Amer: >60 mL/min (ref >60)
GLUCOSE: 120 mg/dL — AB (ref 65–99)
POTASSIUM: 4.2 mmol/L (ref 3.5–5.1)
Sodium: 138 mmol/L (ref 135–145)

## 2015-10-17 LAB — TROPONIN I: Troponin I: <0.03 ng/mL (ref <0.031)

## 2015-10-17 LAB — T4, FREE: Free T4: 1.16 ng/dL — ABNORMAL HIGH (ref 0.61–1.12)

## 2015-10-17 MED ORDER — GLIPIZIDE 10 MG PO TABS: 5.0000 mg | ORAL_TABLET | Freq: Two times a day (BID) | ORAL | Status: DC

## 2015-10-17 NOTE — Progress Notes (Signed)
Patients personal belongings returned to patient. Waiting on ride to go over discharge instructions per patient request

## 2015-10-17 NOTE — Care Management Obs Status (Signed)
MEDICARE OBSERVATION STATUS NOTIFICATION   Patient Details  Name: Christine BestShobhna J Griswold MRN: [id number] Date of Birth: 08/30/1945   Medicare Observation Status Notification Given:  Yes    Chapman FitchBOWEN, Destin Vinsant T, RN 10/17/2015, 1:22 PM

## 2015-10-17 NOTE — Progress Notes (Signed)
Discharge instructions along with home medication list gone over with patient. Patient verbalized that she understood instructions. No c/o pain no distress noted. Patient made aware that prescriptions was electronically submitted to [facility]. Home health information was given to patient by case manager. Iv removed and telemetry. Patient to be taken off the floor via wheelchair by staff

## 2015-10-17 NOTE — Care Management (Signed)
Patient admitted for chest pain.  Patient lives at home with her husband.  Requested for RNCM to arrange home health RN for BP checks, glucose monitoring, and HR check.  Patient quiet and reserved during assessment and offered limited information.  Patient agreeable to home health services.  I provided patient with list of home health agencies.  Patient states that she does not have a preference of agency.  I have made the referral to [facility].  [facility] with [facility] notified of discharge. RNCM signing off.

## 2015-10-17 NOTE — Discharge Summary (Signed)
[facility] - [place] at [facility]   PATIENT NAME: Christine Zimmerman    MR#:  [id number]  DATE OF BIRTH:  1945-08-02  DATE OF ADMISSION:  10/16/2015 ADMITTING PHYSICIAN: Katharina Caper, MD  DATE OF DISCHARGE: 10/17/2015  6:36 PM  PRIMARY CARE PHYSICIAN: No primary care provider on file.     ADMISSION DIAGNOSIS:  Bradycardia [R00.1] Weakness [R53.1] Left-sided weakness [M62.89]  DISCHARGE DIAGNOSIS:  Active Problems:   Chest pain   Left-sided weakness   TIA (transient ischemic attack)   Hypotension   Bradycardia   Hypoglycemia   Hyponatremia   Anemia   SECONDARY DIAGNOSIS:   Past Medical History  Diagnosis Date  . Diabetes mellitus without complication (HCC)   . Hypertension   . Coronary artery disease   . Hypothyroidism   . GERD (gastroesophageal reflux disease)   . High cholesterol     .pro HOSPITAL COURSE:  Christine Zimmerman is a 70 y.o. female with a known history of Coronary artery disease, hypothyroidism, gastroesophageal reflux disease, hyperlipidemia, who presents to the hospital with complaints of chest pains and left-sided weakness and numbness. Patient was doing well up until approximately 2 days ago when she started noticing left-sided numbness and weakness , also difficulty walking. Also, she was having chest pains. Pain is described as left-sided chest pain, sharp, intermittent pain,  increasing with activity, accompanied by shortness of breath, lightheadedness as well as dizziness, radiating to shoulder blade as well as neck, it would come for 3 minutes and would go away, usually  due to activity. Patient admitted of  losing weight ,  felt weak and presyncopal, also was having palpitations. She decided to come to emergency room for further evaluation. She tells me that at home her blood pressure at home was high. Her blood glucose levels was low and she felt shaky and her heart was pounding and she came to the hospital. According to her,  EMS  told her that she had cardiac block, EKG in  emergency room showed RBBB.  In emergency room, patient was noted to be hypotensive as well as high bradycardic with heart rate going as low as 47 intermittently and hospitalist services were contacted for admission. Patient's labs showed hyponatremia, hyperglycemia, she was rehydrated and her blood pressure medications were discontinued. Diabetic medications were reduced. The patient was seen by cardiologist DR Gwen Pounds, who recommended ambulate patient and follow for symptoms, follow up with primary cardiologist for outpatient stress test. The patient felt good, she was felt to be stable to be discharged home. Discussion by problem: #1. Chest pain, no cardiac injury, negative cardiac enzymes, patient will need to have Echo done as outpatient.  Continue aspirin, Plavix, appreciate cardiologist recommendations.  #2. Left-sided weakness, MRI of the brain was negative for injury, likely TIA, the patient is to continue aspirin as well as Plavix, Lipitor, physical therapist evaluated patient, recommended outpatient Pt, but patient refused. She, however agreed to [facility] RN .  #3. Hypotension, improved on IV fluids , holding all blood pressure medications, etiology remains unclear, could be due to recent weight loss, suspend blood pressure medications, follow up with PCP and restart them as needed.  #4. Bradycardia, TSH was normal,  patient's heart rate remained low in high 40 to 50s, but no second degree AVB was noted , no pauses on telemetry,  the patient is to follow up with cardiologist dr Juliann Pares within few days to reassess, ?PPM.  #5. Hypoglycemia, resuming some of diabetic medications, hemoglobin A1c was  7.7, continue  diabetic diet, follow BG closely as outpatient, HH RN to visit patient    DISCHARGE CONDITIONS:   stable  CONSULTS OBTAINED:  Treatment Team:  Lamar Blinks, MD  DRUG ALLERGIES:   Allergies  Allergen Reactions  . Biaxin  [Clarithromycin] Hives    DISCHARGE MEDICATIONS:   Discharge Medication List as of 10/17/2015  3:01 PM    CONTINUE these medications which have CHANGED   Details  glipiZIDE (GLUCOTROL) 10 MG tablet Take 0.5 tablets (5 mg total) by mouth 2 (two) times daily before a meal., Starting 10/17/2015, Until Discontinued, Normal      CONTINUE these medications which have NOT CHANGED   Details  aspirin EC 81 MG tablet Take 81 mg by mouth daily., Until Discontinued, Historical Med    atorvastatin (LIPITOR) 80 MG tablet Take 80 mg by mouth daily., Until Discontinued, Historical Med    calcium-vitamin D (OSCAL 500/200 D-3) 500-200 MG-UNIT tablet Take 1 tablet by mouth daily., Until Discontinued, Historical Med    clopidogrel (PLAVIX) 75 MG tablet Take 75 mg by mouth daily., Until Discontinued, Historical Med    cyanocobalamin (,VITAMIN B-12,) 1000 MCG/ML injection Inject 1,000 mcg into the muscle every 30 (thirty) days., Until Discontinued, Historical Med    ferrous sulfate 325 (65 FE) MG tablet Take 325 mg by mouth daily with breakfast., Until Discontinued, Historical Med    gabapentin (NEURONTIN) 300 MG capsule Take 300 mg by mouth at bedtime., Until Discontinued, Historical Med    isosorbide mononitrate (IMDUR) 30 MG 24 hr tablet Take 30 mg by mouth daily., Until Discontinued, Historical Med    levothyroxine (SYNTHROID, LEVOTHROID) 75 MCG tablet Take 75 mcg by mouth daily before breakfast., Until Discontinued, Historical Med    metFORMIN (GLUCOPHAGE) 1000 MG tablet Take 1,000 mg by mouth 2 (two) times daily., Until Discontinued, Historical Med    niacin 500 MG tablet Take 500 mg by mouth daily., Until Discontinued, Historical Med    nitroGLYCERIN (NITROLINGUAL) 0.4 MG/SPRAY spray Place 1 spray under the tongue every 5 (five) minutes x 3 doses as needed for chest pain., Until Discontinued, Historical Med    ranitidine (ZANTAC) 150 MG tablet Take 150 mg by mouth 2 (two) times daily., Until  Discontinued, Historical Med      STOP taking these medications     amLODipine (NORVASC) 5 MG tablet      hydrochlorothiazide (HYDRODIURIL) 25 MG tablet      lisinopril (PRINIVIL,ZESTRIL) 10 MG tablet          DISCHARGE INSTRUCTIONS:    Patient is to follow up with PCP, DR [facility], cardiologist  If you experience worsening of your admission symptoms, develop shortness of breath, life threatening emergency, suicidal or homicidal thoughts you must seek medical attention immediately by calling 911 or calling your MD immediately  if symptoms less severe.  You Must read complete instructions/literature along with all the possible adverse reactions/side effects for all the Medicines you take and that have been prescribed to you. Take any new Medicines after you have completely understood and accept all the possible adverse reactions/side effects.   Please note  You were cared for by a hospitalist during your hospital stay. If you have any questions about your discharge medications or the care you received while you were in the hospital after you are discharged, you can call the unit and asked to speak with the hospitalist on call if the hospitalist that took care of you is not available. Once you are  discharged, your primary care physician will handle any further medical issues. Please note that NO REFILLS for any discharge medications will be authorized once you are discharged, as it is imperative that you return to your primary care physician (or establish a relationship with a primary care physician if you do not have one) for your aftercare needs so that they can reassess your need for medications and monitor your lab values.    Today   CHIEF COMPLAINT:   Chief Complaint  Patient presents with  . Chest Pain    left side chest and numbness to head  . Hypoglycemia    70  . Hypertension    168/70  . Weakness  . Headache    HISTORY OF PRESENT ILLNESS:  Christine Zimmerman  is a 70  y.o. female with a known history of Coronary artery disease, hypothyroidism, gastroesophageal reflux disease, hyperlipidemia, who presents to the hospital with complaints of chest pains and left-sided weakness and numbness. Patient was doing well up until approximately 2 days ago when she started noticing left-sided numbness and weakness , also difficulty walking. Also, she was having chest pains. Pain is described as left-sided chest pain, sharp, intermittent pain,  increasing with activity, accompanied by shortness of breath, lightheadedness as well as dizziness, radiating to shoulder blade as well as neck, it would come for 3 minutes and would go away, usually  due to activity. Patient admitted of  losing weight ,  felt weak and presyncopal, also was having palpitations. She decided to come to emergency room for further evaluation. She tells me that at home her blood pressure at home was high. Her blood glucose levels was low and she felt shaky and her heart was pounding and she came to the hospital. 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  In emergency room, patient was noted to be hypotensive as well as high bradycardic with heart rate going as low as 47 intermittently and hospitalist services were contacted for admission. Patient's labs showed hyponatremia, hyperglycemia, she was rehydrated and her blood pressure medications were discontinued. Diabetic medications were reduced. The patient was seen by cardiologist DR Gwen Pounds, who recommended ambulate patient and follow for symptoms, follow up with primary cardiologist for outpatient stress test. The patient felt good, she was felt to be stable to be discharged home. Discussion by problem: #1. Chest pain, no cardiac injury, negative cardiac enzymes, patient will need to have Echo done as outpatient.  Continue aspirin, Plavix, appreciate cardiologist recommendations.  #2. Left-sided weakness, MRI of the  brain was negative for injury, likely TIA, the patient is to continue aspirin as well as Plavix, Lipitor, physical therapist evaluated patient, recommended outpatient Pt, but patient refused. She, however agreed to [facility] RN .  #3. Hypotension, improved on IV fluids , holding all blood pressure medications, etiology remains unclear, could be due to recent weight loss, suspend blood pressure medications, follow up with PCP and restart them as needed.  #4. Bradycardia, TSH was normal,  patient's heart rate remained low in high 40 to 50s, but no second degree AVB was noted , no pauses on telemetry,  the patient is to follow up with cardiologist dr Juliann Pares within few days to reassess, ?PPM.  #5. Hypoglycemia, resuming some of diabetic medications, hemoglobin A1c was 7.7, continue  diabetic diet, follow BG closely as outpatient, HH RN to visit patient     VITAL SIGNS:  Blood pressure 136/55, pulse  49, temperature 97.9 F (36.6 C), temperature source Oral, resp. rate 18, height 5\' 2"  (1.575 m), weight 79.833 kg (176 lb), SpO2 100 %.  I/O:   Intake/Output Summary (Last 24 hours) at 10/17/15 2232 Last data filed at 10/17/15 1242  Gross per 24 hour  Intake 2446.25 ml  Output    800 ml  Net 1646.25 ml    PHYSICAL EXAMINATION:  GENERAL:  70 y.o.-year-old patient lying in the bed with no acute distress.  EYES: Pupils equal, round, reactive to light and accommodation. No scleral icterus. Extraocular muscles intact.  HEENT: Head atraumatic, normocephalic. Oropharynx and nasopharynx clear.  NECK:  Supple, no jugular venous distention. No thyroid enlargement, no tenderness.  LUNGS: Normal breath sounds bilaterally, no wheezing, rales,rhonchi or crepitation. No use of accessory muscles of respiration.  CARDIOVASCULAR: S1, S2 normal. No murmurs, rubs, or gallops.  ABDOMEN: Soft, non-tender, non-distended. Bowel sounds present. No organomegaly or mass.  EXTREMITIES: No pedal edema, cyanosis, or clubbing.   NEUROLOGIC: Cranial nerves II through XII are intact. Muscle strength 5/5 in all extremities. Sensation intact. Gait not checked.  PSYCHIATRIC: The patient is alert and oriented x 3.  SKIN: No obvious rash, lesion, or ulcer.   DATA REVIEW:   CBC  Recent Labs Lab 10/17/15 0022  WBC 5.4  HGB 11.9*  HCT 34.6*  PLT 306    Chemistries   Recent Labs Lab 10/16/15 0557  10/17/15 0022  NA  --   < > 138  K  --   < > 4.2  CL  --   < > 111  CO2  --   < > 22  GLUCOSE  --   < > 120*  BUN  --   < > 8  CREATININE  --   < > 0.65  CALCIUM  --   < > 8.4*  MG 1.6*  --   --   < > = values in this interval not displayed.  Cardiac Enzymes  Recent Labs Lab 10/17/15 0022  TROPONINI <0.03    Microbiology Results  No results found for this or any previous visit.  RADIOLOGY:  Dg Chest 2 View  10/16/2015  CLINICAL DATA:  Acute onset of left upper chest pain, generalized weakness and headache. Shortness of breath. Initial encounter. EXAM: CHEST  2 VIEW COMPARISON:  Chest radiograph performed 05/19/2015 FINDINGS: The lungs are well-aerated. Minimal left basilar atelectasis is noted. Mild peribronchial thickening is seen. There is no evidence of pleural effusion or pneumothorax. The heart is borderline normal in size. No acute osseous abnormalities are seen. IMPRESSION: Minimal left basilar atelectasis noted. Mild peribronchial thickening seen. Electronically Signed   By: Roanna Raider M.D.   On: 10/16/2015 03:15   Ct Head Wo Contrast  10/16/2015  CLINICAL DATA:  Patient with low blood sugar, finger numbness, chest pain, headache and leg pain. EXAM: CT HEAD WITHOUT CONTRAST TECHNIQUE: Contiguous axial images were obtained from the base of the skull through the vertex without intravenous contrast. COMPARISON:  None. FINDINGS: Ventricles and sulci are appropriate for patient's age. No evidence for acute cortically based infarct, intracranial hemorrhage, mass lesion or mass-effect. Orbits are  unremarkable. Paranasal sinuses are well aerated. Mastoid air cells unremarkable. Calvarium is intact. IMPRESSION: No acute intracranial process Electronically Signed   By: Annia Belt M.D.   On: 10/16/2015 07:32   Mr Brain Wo Contrast  10/16/2015  CLINICAL DATA:  Left-sided weakness.  Difficulty walking. EXAM: MRI HEAD WITHOUT CONTRAST TECHNIQUE: Multiplanar, multiecho pulse  sequences of the brain and surrounding structures were obtained without intravenous contrast. COMPARISON:  CT 10/16/2015, MRI 10/09/2014 FINDINGS: Ventricle size normal.  Cerebral volume normal. Negative for acute or chronic infarction. Negative for hemorrhage.  No fluid collection. Negative for mass or edema.  No shift of the midline structures. Pituitary and skull base normal.  Circle of  Willis patent. Bilateral lens replacement. No orbital mass lesion. Paranasal sinuses clear. IMPRESSION: No significant abnormality. Electronically Signed   By: Marlan Palauharles  Clark M.D.   On: 10/16/2015 11:06   [place] Carotid Bilateral  10/17/2015  CLINICAL DATA:  TIA symptoms, hypertension, syncope, coronary disease and hyperlipidemia. EXAM: BILATERAL CAROTID DUPLEX ULTRASOUND TECHNIQUE: Wallace CullensGray scale imaging, color Doppler and duplex ultrasound were performed of bilateral carotid and vertebral arteries in the neck. COMPARISON:  None. FINDINGS: Criteria: Quantification of carotid stenosis is based on velocity parameters that correlate the residual internal carotid diameter with NASCET-based stenosis levels, using the diameter of the distal internal carotid lumen as the denominator for stenosis measurement. The following velocity measurements were obtained: RIGHT ICA:  90/26 cm/sec CCA:  81/17 cm/sec SYSTOLIC ICA/CCA RATIO:  1.1 DIASTOLIC ICA/CCA RATIO:  1.6 ECA:  92 cm/sec LEFT ICA:  96/29 cm/sec CCA:  97/18 cm/sec SYSTOLIC ICA/CCA RATIO:  1.0 DIASTOLIC ICA/CCA RATIO:  1.6 ECA:  79 cm/sec RIGHT CAROTID ARTERY: Minor echogenic shadowing plaque formation. No  hemodynamically significant right ICA stenosis, velocity elevation, or turbulent flow. Degree of narrowing less than 50%. RIGHT VERTEBRAL ARTERY:  Antegrade LEFT CAROTID ARTERY: Similar scattered minor echogenic plaque formation. No hemodynamically significant left ICA stenosis, velocity elevation, or turbulent flow. LEFT VERTEBRAL ARTERY:  Antegrade IMPRESSION: Minor carotid atherosclerosis. No hemodynamically significant ICA stenosis. Degree of narrowing less than 50% bilaterally. Patent antegrade vertebral flow bilaterally . Electronically Signed   By: Judie PetitM.  Shick M.D.   On: 10/17/2015 14:37    EKG:   Orders placed or performed during the hospital encounter of 10/16/15  . EKG 12-Lead  . EKG 12-Lead  . ED EKG within 10 minutes  . ED EKG within 10 minutes  . EKG 12-Lead  . EKG 12-Lead      Management plans discussed with the patient, family and they are in agreement.  CODE STATUS:  Code Status History    Date Active Date Inactive Code Status Order ID Comments User Context   10/16/2015 12:01 PM 10/17/2015  9:36 PM Full Code [id number]  Katharina Caperima Baillie Mohammad, MD Inpatient      TOTAL TIME TAKING CARE OF THIS PATIENT: 40 minutes.    Katharina CaperVAICKUTE,Tiffney Haughton M.D on 10/17/2015 at 10:32 PM  Between 7am to 6pm - Pager - [phone]  After 6pm go to [email or web address] - password [email or web address] [facility]  [facility]  Office  [phone]  CC: Primary care physician; No primary care provider on file.

## 2015-10-23 ENCOUNTER — Encounter
Admission: RE | Disposition: A | Discharge status: Home / Self Care | Payer: Self-pay | Source: Ambulatory Visit | Attending: Internal Medicine

## 2015-10-23 ENCOUNTER — Ambulatory Visit
Admission: RE | Admit: 2015-10-23 | Discharge: 2015-10-23 | Disposition: A | Discharge status: Home / Self Care | Payer: Medicare Other | Source: Ambulatory Visit | Attending: Internal Medicine | Admitting: Internal Medicine

## 2015-10-23 DIAGNOSIS — Z7984 Long term (current) use of oral hypoglycemic drugs: Secondary | ICD-10-CM | POA: Insufficient documentation

## 2015-10-23 DIAGNOSIS — Z79899 Other long term (current) drug therapy: Secondary | ICD-10-CM | POA: Insufficient documentation

## 2015-10-23 DIAGNOSIS — I25110 Atherosclerotic heart disease of native coronary artery with unstable angina pectoris: Secondary | ICD-10-CM | POA: Insufficient documentation

## 2015-10-23 DIAGNOSIS — K219 Gastro-esophageal reflux disease without esophagitis: Secondary | ICD-10-CM | POA: Diagnosis not present

## 2015-10-23 DIAGNOSIS — E039 Hypothyroidism, unspecified: Secondary | ICD-10-CM | POA: Diagnosis not present

## 2015-10-23 DIAGNOSIS — Z881 Allergy status to other antibiotic agents status: Secondary | ICD-10-CM | POA: Diagnosis not present

## 2015-10-23 DIAGNOSIS — K5900 Constipation, unspecified: Secondary | ICD-10-CM | POA: Diagnosis not present

## 2015-10-23 DIAGNOSIS — I10 Essential (primary) hypertension: Secondary | ICD-10-CM | POA: Insufficient documentation

## 2015-10-23 DIAGNOSIS — R0602 Shortness of breath: Secondary | ICD-10-CM | POA: Insufficient documentation

## 2015-10-23 DIAGNOSIS — E119 Type 2 diabetes mellitus without complications: Secondary | ICD-10-CM | POA: Insufficient documentation

## 2015-10-23 DIAGNOSIS — Z7982 Long term (current) use of aspirin: Secondary | ICD-10-CM | POA: Diagnosis not present

## 2015-10-23 DIAGNOSIS — E7800 Pure hypercholesterolemia, unspecified: Secondary | ICD-10-CM | POA: Diagnosis not present

## 2015-10-23 DIAGNOSIS — R079 Chest pain, unspecified: Secondary | ICD-10-CM | POA: Diagnosis present

## 2015-10-23 HISTORY — PX: CARDIAC CATHETERIZATION: SHX172

## 2015-10-23 LAB — FIBRIN DERIVATIVES D-DIMER (ARMC ONLY): Fibrin derivatives D-dimer (ARMC): 229 (ref 0–499)

## 2015-10-23 SURGERY — LEFT HEART CATH AND CORONARY ANGIOGRAPHY
Anesthesia: Moderate Sedation

## 2015-10-23 SURGERY — LEFT HEART CATH AND CORONARY ANGIOGRAPHY
Anesthesia: Moderate Sedation | Laterality: Left

## 2015-10-23 MED ORDER — CLOPIDOGREL BISULFATE 75 MG PO TABS: 75.0000 mg | ORAL_TABLET | Freq: Every day | ORAL | Status: DC

## 2015-10-23 MED ORDER — SODIUM CHLORIDE 0.9% FLUSH: 3.0000 mL | INTRAVENOUS | Status: DC | PRN

## 2015-10-23 MED ORDER — SODIUM CHLORIDE 0.9 % WEIGHT BASED INFUSION: 1.0000 mL/kg/h | INTRAVENOUS | Status: DC

## 2015-10-23 MED ORDER — SODIUM CHLORIDE 0.9 % WEIGHT BASED INFUSION: 3.0000 mL/kg/h | INTRAVENOUS | Status: DC

## 2015-10-23 MED ORDER — ACETAMINOPHEN 325 MG PO TABS: 650.0000 mg | ORAL_TABLET | ORAL | Status: DC | PRN

## 2015-10-23 MED ORDER — SODIUM CHLORIDE 0.9% FLUSH: 3.0000 mL | Freq: Two times a day (BID) | INTRAVENOUS | Status: DC

## 2015-10-23 MED ORDER — FENTANYL CITRATE (PF) 100 MCG/2ML IJ SOLN
INTRAMUSCULAR | Status: DC | PRN
  Administered 2015-10-23: 25 ug via INTRAVENOUS

## 2015-10-23 MED ORDER — SODIUM CHLORIDE 0.9 % IV SOLN
INTRAVENOUS | Status: DC
  Administered 2015-10-23: 09:00:00 via INTRAVENOUS

## 2015-10-23 MED ORDER — FENTANYL CITRATE (PF) 100 MCG/2ML IJ SOLN
INTRAMUSCULAR | Status: AC
  Filled 2015-10-23: qty 2

## 2015-10-23 MED ORDER — SODIUM CHLORIDE 0.9 % IV SOLN: 250.0000 mL | INTRAVENOUS | Status: DC | PRN

## 2015-10-23 MED ORDER — ASPIRIN 81 MG PO CHEW: 81.0000 mg | CHEWABLE_TABLET | Freq: Every day | ORAL | Status: DC

## 2015-10-23 MED ORDER — MIDAZOLAM HCL 2 MG/2ML IJ SOLN
INTRAMUSCULAR | Status: AC
  Filled 2015-10-23: qty 2

## 2015-10-23 MED ORDER — ASPIRIN 81 MG PO CHEW: 81.0000 mg | CHEWABLE_TABLET | ORAL | Status: DC

## 2015-10-23 MED ORDER — MIDAZOLAM HCL 2 MG/2ML IJ SOLN
INTRAMUSCULAR | Status: DC | PRN
  Administered 2015-10-23: 1 mg via INTRAVENOUS

## 2015-10-23 MED ORDER — HEPARIN (PORCINE) IN NACL 2-0.9 UNIT/ML-% IJ SOLN
INTRAMUSCULAR | Status: AC
  Filled 2015-10-23: qty 500

## 2015-10-23 MED ORDER — ONDANSETRON HCL 4 MG/2ML IJ SOLN: 4.0000 mg | Freq: Four times a day (QID) | INTRAMUSCULAR | Status: DC | PRN

## 2015-10-23 SURGICAL SUPPLY — 10 items
CATH INFINITI 5FR ANG PIGTAIL (CATHETERS) ×2 IMPLANT
CATH INFINITI 5FR JL4 (CATHETERS) ×2 IMPLANT
CATH INFINITI JR4 5F (CATHETERS) ×2 IMPLANT
DEVICE CLOSURE MYNXGRIP 5F (Vascular Products) ×2 IMPLANT
KIT MANI 3VAL PERCEP (MISCELLANEOUS) ×3 IMPLANT
NDL PERC 18GX7CM (NEEDLE) IMPLANT
NEEDLE PERC 18GX7CM (NEEDLE) ×3 IMPLANT
PACK CARDIAC CATH (CUSTOM PROCEDURE TRAY) ×3 IMPLANT
SHEATH AVANTI 5FR X 11CM (SHEATH) ×2 IMPLANT
WIRE EMERALD 3MM-J .035X150CM (WIRE) ×2 IMPLANT

## 2015-11-07 ENCOUNTER — Other Ambulatory Visit: Payer: Self-pay | Admitting: Physician Assistant

## 2015-11-07 DIAGNOSIS — Z1239 Encounter for other screening for malignant neoplasm of breast: Secondary | ICD-10-CM

## 2015-11-07 DIAGNOSIS — R921 Mammographic calcification found on diagnostic imaging of breast: Secondary | ICD-10-CM

## 2015-11-26 ENCOUNTER — Other Ambulatory Visit: Payer: Medicare Other

## 2015-11-26 ENCOUNTER — Ambulatory Visit: Payer: Medicare Other | Attending: Physician Assistant

## 2015-12-15 ENCOUNTER — Ambulatory Visit
Admission: RE | Admit: 2015-12-15 | Discharge: 2015-12-15 | Disposition: A | Discharge status: Home / Self Care | Payer: Medicare Other | Source: Ambulatory Visit | Attending: Physician Assistant | Admitting: Physician Assistant

## 2015-12-15 DIAGNOSIS — R921 Mammographic calcification found on diagnostic imaging of breast: Secondary | ICD-10-CM | POA: Diagnosis not present

## 2015-12-15 DIAGNOSIS — Z1239 Encounter for other screening for malignant neoplasm of breast: Secondary | ICD-10-CM

## 2015-12-17 ENCOUNTER — Ambulatory Visit: Payer: Medicare Other | Attending: Neurology | Admitting: Physical Therapy

## 2015-12-17 DIAGNOSIS — R262 Difficulty in walking, not elsewhere classified: Secondary | ICD-10-CM

## 2015-12-17 DIAGNOSIS — R2681 Unsteadiness on feet: Secondary | ICD-10-CM | POA: Diagnosis not present

## 2015-12-17 DIAGNOSIS — M6281 Muscle weakness (generalized): Secondary | ICD-10-CM | POA: Diagnosis present

## 2015-12-18 NOTE — Therapy (Addendum)
[place] [facility] [address] [place], [place], [address] Phone: [phone]   Fax:  [phone]  Physical Therapy Evaluation  Patient Details  Name: Christine Zimmerman MRN: [id number] Date of Birth: 08/14/1945 Referring Provider: Dr. Cristopher Peru  Encounter Date: 12/17/2015    Past Medical History:  Diagnosis Date  . Coronary artery disease   . Diabetes mellitus without complication (HCC)   . GERD (gastroesophageal reflux disease)   . High cholesterol   . Hypertension   . Hypothyroidism     Past Surgical History:  Procedure Laterality Date  . BACK SURGERY    . CARDIAC CATHETERIZATION Left 10/23/2015   Procedure: Left Heart Cath and Coronary Angiography;  Surgeon: Alwyn Pea, MD;  Location: [facility] INVASIVE CV LAB;  Service: Cardiovascular;  Laterality: Left;  . CORONARY STENT PLACEMENT     x 4 at [facility]. x 1 by [facility] in 2004    There were no vitals filed for this visit.     Pt. states she has been having issues with descending stairs and has had a decrease in L LE muscle strength/ overall mobility.  Pt. hoping to progress to the [employer]/ [employer] ex. program and progress to minimal PT over next several weeks.  Pt. has chronic L shoulder pain with overhead reaching/ daily tasks.      OBJECTIVE: There.ex.:  See HEP.  Nustep L5-6 10 min. B UE/LE.      Pt response for medical necessity: Pt. Fatigues but able to complete all there.ex. With min. Rest breaks.  Complaints of neck/ L shoulder pain t/o tx. Session.        Pt. is a pleasant 71 y/o female with progressive worsening of L sided muscle weakness/ gait/ balance.  Pt. currently reports chronic neck/ L shoulder discomfort with daily household tasks.  Berg balance test: 53/56.  O2 sat. 100%, HR 51 bpm, BP 157/59.  LEFS: 22 out of 80.  B LE muscle strength grossly 4/5 MMT except B quads 4+/5 MMT.  Good sensation in B LE with a hisotry of leg numbness reported.  Pt.  reports memory issues over past year.  Pt. able to stand from low chair with no UE assist safely.  Pt. ambulates with limited step pattern/ hip flexion on level surfaces with no assistive device.  Forward head/ round shoulder posture in sitting and standing.  Pt. will benefit from skilled PT services to increase B LE muscle strength/ upright posture to improve functional mobility and progression to an independent gym based ex. program.             PT Long Term Goals - 12/18/15 1011      PT LONG TERM GOAL #1   Title Pt. I with HEP to increase B LE muscle strength 1/2 muscle grade (4+/5 MMT) to improve standing tolerance/ independence with functional mobility.     Baseline B LE muscle strength grossly 4/5 MMT except quad 4+/5 MMT   Time 4   Period Weeks   Status New     PT LONG TERM GOAL #2   Title Pt. will increase LEFS to >35 out of 80 to improve pain-free mobility.     Baseline LEFS: 22 out of 80 on 8/2   Time 4   Period Weeks   Status New     PT LONG TERM GOAL #3   Title Pt. able to progress to an independent [employer] ex. program with no pain/ issues.  Baseline currently not ex.    Time 4   Period Weeks   Status New     PT LONG TERM GOAL #4   Title Pt. will demonstrate proper upright seated/ standing posture with no c/o neck/L shoulder pain.     Baseline poor posture (forward head/rounded sh.).     Time 4   Period Weeks   Status New           Plan - 12/23/15 1712    Rehab Potential Good   PT Frequency 2x / week   PT Duration 4 weeks   PT Treatment/Interventions ADLs/Self Care Home Management;Neuromuscular re-education;Balance training;Therapeutic exercise;Therapeutic activities;Functional mobility training;Stair training;Gait training;DME Instruction;Patient/family education;Manual techniques;Passive range of motion;Energy conservation   PT Next Visit Plan Progress LE strengthening/ higher level balance tasks.     PT Home Exercise Plan See HEP    Consulted and Agree with Plan of Care Patient      Patient will benefit from skilled therapeutic intervention in order to improve the following deficits and impairments:  Abnormal gait, Decreased endurance, Decreased activity tolerance, Decreased strength, Difficulty walking, Decreased mobility, Decreased balance, Postural dysfunction, Impaired flexibility  Visit Diagnosis: Unsteady gait  Difficulty in walking, not elsewhere classified  Muscle weakness (generalized)     Problem List Patient Active Problem List   Diagnosis Date Noted  . TIA (transient ischemic attack) 10/17/2015  . Hyponatremia 10/17/2015  . Anemia 10/17/2015  . Chest pain 10/16/2015  . Left-sided weakness 10/16/2015  . Hypotension 10/16/2015  . Bradycardia 10/16/2015  . Hypoglycemia 10/16/2015   Cammie Mcgee, PT, DPT # [phone] 12/24/2015, 10:44 AM  [place] [facility] [address] [place], [place], [address] Phone: [phone]   Fax:  [phone]  Name: Christine Zimmerman MRN: [id number] Date of Birth: June 24, 1945

## 2015-12-22 ENCOUNTER — Encounter: Payer: Self-pay | Admitting: Physical Therapy

## 2015-12-22 ENCOUNTER — Ambulatory Visit: Payer: Medicare Other | Admitting: Physical Therapy

## 2015-12-22 DIAGNOSIS — R2681 Unsteadiness on feet: Secondary | ICD-10-CM | POA: Diagnosis not present

## 2015-12-22 DIAGNOSIS — M6281 Muscle weakness (generalized): Secondary | ICD-10-CM

## 2015-12-22 DIAGNOSIS — R262 Difficulty in walking, not elsewhere classified: Secondary | ICD-10-CM

## 2015-12-22 NOTE — Therapy (Addendum)
[place] [facility] [address] [place], [place], [address] Phone: [phone]   Fax:  [phone]  Physical Therapy Treatment  Patient Details  Name: Christine BestShobhna J Zimmerman MRN: [id number] Date of Birth: 01/13/1946 Referring Provider: Dr. Cristopher PeruHemang Shah  Encounter Date: 12/22/2015      PT End of Session - 12/22/15 1312    Visit Number 2   Number of Visits 8   Date for PT Re-Evaluation 01/14/16   Authorization - Visit Number 2   Authorization - Number of Visits 10   PT Start Time 1303   PT Stop Time 1355   PT Time Calculation (min) 52 min   Equipment Utilized During Treatment Gait belt   Activity Tolerance Patient tolerated treatment well;Patient limited by fatigue   Behavior During Therapy [facility] for tasks assessed/performed      Past Medical History:  Diagnosis Date  . Coronary artery disease   . Diabetes mellitus without complication (HCC)   . GERD (gastroesophageal reflux disease)   . High cholesterol   . Hypertension   . Hypothyroidism     Past Surgical History:  Procedure Laterality Date  . BACK SURGERY    . CARDIAC CATHETERIZATION Left 10/23/2015   Procedure: Left Heart Cath and Coronary Angiography;  Surgeon: Alwyn Peawayne D Callwood, MD;  Location: [facility] INVASIVE CV LAB;  Service: Cardiovascular;  Laterality: Left;  . CORONARY STENT PLACEMENT     [facility]    There were no vitals filed for this visit.      Subjective Assessment - 12/22/15 1311    Subjective Pt. reports 5/10 neck pain prior to tx. session.  Pt. states she has to use 2 pillow while sleeping at night due to BP issues.     Limitations Lifting;Standing;Walking;House hold activities   Patient Stated Goals Increase B LE muscle strength/ improve balance/ gait.   Currently in Pain? Yes   Pain Score 5    Pain Location Neck   Pain Orientation Mid   Pain Type Chronic pain       OBJECTIVE:  There.ex.:  Nustep L5 10 min. B UE/LE (warm-up/ no  charge).  Walking around PT clinic with increase hip flexion/ step pattern.  Step ups/down with 1 UE assist 20x at stairs.  Ascend/descend 4 steps with recip. Pattern and 1 handrail safely (increase c/o LBP).  Neuro mm.: 5xSTS: 28.7 sec./ 26.1 sec. (B hamstring fatigue reported).  Walking marching with alt. LE touches/ lateral walking with agility ladder feedback/ tandem walking/ SLS (>10 sec.)- new HEP.  Functional cone reaching.  Discussed HEP and added tandem/ SLS.      Pt response for medical necessity: benefits from generalized/ LE strengthening ex. Program with progressive balance tasks to improve safety/independence with walking.  Improving SLS noted today and new challenging HEP added today.           Plan - 12/23/15 1712    Rehab Potential Good   PT Frequency 2x / week   PT Duration 4 weeks   PT Treatment/Interventions ADLs/Self Care Home Management;Neuromuscular re-education;Balance training;Therapeutic exercise;Therapeutic activities;Functional mobility training;Stair training;Gait training;DME Instruction;Patient/family education;Manual techniques;Passive range of motion;Energy conservation   PT Next Visit Plan Progress LE strengthening/ higher level balance tasks.     PT Home Exercise Plan See HEP   Consulted and Agree with Plan of Care Patient      Patient will benefit from skilled therapeutic intervention in order to improve the following deficits  and impairments:  Abnormal gait, Decreased endurance, Decreased activity tolerance, Decreased strength, Difficulty walking, Decreased mobility, Decreased balance, Postural dysfunction, Impaired flexibility  Visit Diagnosis: Unsteady gait  Difficulty in walking, not elsewhere classified  Muscle weakness (generalized)     Problem List Patient Active Problem List   Diagnosis Date Noted  . TIA (transient ischemic attack) 10/17/2015  . Hyponatremia 10/17/2015  . Anemia 10/17/2015  . Chest pain 10/16/2015  . Left-sided  weakness 10/16/2015  . Hypotension 10/16/2015  . Bradycardia 10/16/2015  . Hypoglycemia 10/16/2015   Cammie Mcgee, PT, DPT # [phone]  12/23/2015, 5:13 PM  [place] [facility] [address] [place], [place], [address] Phone: [phone]   Fax:  [phone]  Name: Christine Zimmerman MRN: [id number] Date of Birth: July 16, 1945

## 2015-12-24 ENCOUNTER — Ambulatory Visit: Payer: Medicare Other | Admitting: Physical Therapy

## 2015-12-24 DIAGNOSIS — M6281 Muscle weakness (generalized): Secondary | ICD-10-CM

## 2015-12-24 DIAGNOSIS — R262 Difficulty in walking, not elsewhere classified: Secondary | ICD-10-CM

## 2015-12-24 DIAGNOSIS — R2681 Unsteadiness on feet: Secondary | ICD-10-CM

## 2015-12-24 NOTE — Addendum Note (Signed)
Addended by: Dorene GrebeSHERK, Raimi Guillermo C on: 12/24/2015 11:04 AM   Modules accepted: Orders

## 2015-12-25 NOTE — Therapy (Addendum)
[place] [facility] [address] [place], [place], [address] Phone: [phone]   Fax:  [phone]  Physical Therapy Treatment  Patient Details  Name: Christine Zimmerman MRN: [id number] Date of Birth: [birth year] Referring Provider: Dr. Cristopher Peru  Encounter Date: 12/24/2015      PT End of Session - 12/25/15 1731    Visit Number 3   Number of Visits 8   Date for PT Re-Evaluation 01/14/16   Authorization - Visit Number 3   Authorization - Number of Visits 10   PT Start Time 1048   PT Stop Time 1140   PT Time Calculation (min) 52 min      Past Medical History:  Diagnosis Date  . Coronary artery disease   . Diabetes mellitus without complication (HCC)   . GERD (gastroesophageal reflux disease)   . High cholesterol   . Hypertension   . Hypothyroidism     Past Surgical History:  Procedure Laterality Date  . BACK SURGERY    . CARDIAC CATHETERIZATION Left 10/23/2015   Procedure: Left Heart Cath and Coronary Angiography;  Surgeon: Alwyn Pea, MD;  Location: [facility] INVASIVE CV LAB;  Service: Cardiovascular;  Laterality: Left;  . CORONARY STENT PLACEMENT     x 4 at [facility] x 1 by [facility] in 2004    There were no vitals filed for this visit.    No new complaints.  No falls reported.  Pt. states she is planning on attending a [employer] ex. class.    OBJECTIVE:  There.ex.: 2# ankle wts. Seated LAQ/ heel raises/ hip flexion/ standing hip abd./ hip ext./ 3" step touches 20x each (in //-bars with mirror and verbal cuing).  Discussed HEP (loaned ankle wts for HEP).  Neuro.: tandem stance/ gait (in //-bars)- SBA/CGA for safety.  Cone taps/ functional reaching tasks with cones (varying distances/ heights).  Ambulate in clinic and outside with instruction in arm swing/ proper technique for safety.      Pt response for medical necessity: pt. Benefits from generalized strength training to improve B LE (esp. L LE) muscle strength to  improve gait and balance.  Pt. Easily fatigued with standing/ gait ex.     Pt. requires several short seated rest breaks due to LE muscle fatigue.  No LOB with several balance tasks and is working on tandem/ SLS for HEP to improve LE stability/ balance.  Pt. ambulate with mod. I with is focused on increasing hip flexion/ heel strike with each step.  Good technique with HEP.           PT Long Term Goals - 12/18/15 1011      PT LONG TERM GOAL #1   Title Pt. I with HEP to increase B LE muscle strength 1/2 muscle grade (4+/5 MMT) to improve standing tolerance/ independence with functional mobility.     Baseline B LE muscle strength grossly 4/5 MMT except quad 4+/5 MMT   Time 4   Period Weeks   Status New     PT LONG TERM GOAL #2   Title Pt. will increase LEFS to >35 out of 80 to improve pain-free mobility.     Baseline LEFS: 22 out of 80 on 8/2   Time 4   Period Weeks   Status New     PT LONG TERM GOAL #3   Title Pt. able to progress to an independent [employer] ex. program with no pain/ issues.     Baseline  currently not ex.    Time 4   Period Weeks   Status New     PT LONG TERM GOAL #4   Title Pt. will demonstrate proper upright seated/ standing posture with no c/o neck/L shoulder pain.     Baseline poor posture (forward head/rounded sh.).     Time 4   Period Weeks   Status New               Plan - 12/25/15 1731    Rehab Potential Good   PT Frequency 2x / week   PT Duration 4 weeks   PT Treatment/Interventions ADLs/Self Care Home Management;Neuromuscular re-education;Balance training;Therapeutic exercise;Therapeutic activities;Functional mobility training;Stair training;Gait training;DME Instruction;Patient/family education;Manual techniques;Passive range of motion;Energy conservation   PT Next Visit Plan Progress LE strengthening/ higher level balance tasks.     PT Home Exercise Plan See HEP   Consulted and Agree with Plan of Care Patient       Patient will benefit from skilled therapeutic intervention in order to improve the following deficits and impairments:  Abnormal gait, Decreased endurance, Decreased activity tolerance, Decreased strength, Difficulty walking, Decreased mobility, Decreased balance, Postural dysfunction, Impaired flexibility  Visit Diagnosis: Unsteady gait  Difficulty in walking, not elsewhere classified  Muscle weakness (generalized)     Problem List Patient Active Problem List   Diagnosis Date Noted  . TIA (transient ischemic attack) 10/17/2015  . Hyponatremia 10/17/2015  . Anemia 10/17/2015  . Chest pain 10/16/2015  . Left-sided weakness 10/16/2015  . Hypotension 10/16/2015  . Bradycardia 10/16/2015  . Hypoglycemia 10/16/2015   [name], PT, DPT # [phone]  12/25/2015, 5:32 PM  [place] [facility] [address] [place], [place], [address] Phone: [phone]   Fax:  [phone]  Name: Christine Zimmerman MRN: [id number] Date of Birth: 10/09/1945

## 2015-12-29 ENCOUNTER — Ambulatory Visit: Payer: Medicare Other | Admitting: Physical Therapy

## 2015-12-29 DIAGNOSIS — R2681 Unsteadiness on feet: Secondary | ICD-10-CM | POA: Diagnosis not present

## 2015-12-29 DIAGNOSIS — R262 Difficulty in walking, not elsewhere classified: Secondary | ICD-10-CM

## 2015-12-29 DIAGNOSIS — M6281 Muscle weakness (generalized): Secondary | ICD-10-CM

## 2015-12-29 NOTE — Therapy (Addendum)
[place] [facility] [address] [place], [place], [address] Phone: [phone]   Fax:  [phone]  Physical Therapy Treatment  Patient Details  Name: Christine BestShobhna J Zimmerman MRN: [id number] Date of Birth: 08/17/1945 Referring Provider: Dr. Cristopher PeruHemang Shah  Encounter Date: 12/29/2015      PT End of Session - 12/29/15 1406    Visit Number 4   Number of Visits 8   Date for PT Re-Evaluation 01/14/16   Authorization - Visit Number 4   Authorization - Number of Visits 10   PT Start Time 1402   PT Stop Time 1438   PT Time Calculation (min) 36 min   Equipment Utilized During Treatment Gait belt   Activity Tolerance Patient tolerated treatment well;Patient limited by fatigue   Behavior During Therapy [facility] for tasks assessed/performed      Past Medical History:  Diagnosis Date  . Coronary artery disease   . Diabetes mellitus without complication (HCC)   . GERD (gastroesophageal reflux disease)   . High cholesterol   . Hypertension   . Hypothyroidism     Past Surgical History:  Procedure Laterality Date  . BACK SURGERY    . CARDIAC CATHETERIZATION Left 10/23/2015   Procedure: Left Heart Cath and Coronary Angiography;  Surgeon: Alwyn Peawayne D Callwood, MD;  Location: [facility] INVASIVE CV LAB;  Service: Cardiovascular;  Laterality: Left;  . CORONARY STENT PLACEMENT     [facility]    There were no vitals filed for this visit.      Subjective Assessment - 12/29/15 1407    Subjective Pt. reports increase muscle cramping in B LE this past weekend.  Pt. states she had difficulty sleeping at night due to cramping.     Limitations Lifting;Standing;Walking;House hold activities   Patient Stated Goals Increase B LE muscle strength/ improve balance/ gait.   Currently in Pain? Yes   Pain Score 4    Pain Location Scapula   Pain Orientation Left      OBJECTIVE:  There.ex.:  Nustep L5 10 min. B UE/LE (warm-up/ no charge).  Walking  around PT clinic with increase hip flexion/ step pattern and instruction on cadence (L/R) with posture correction.  Step ups/down with 1 UE assist 20x at stairs.  Ascend/descend 4 steps with recip. Pattern and 1 handrail safely (increase c/o LBP and ankle discomfort).  Neuro mm.:  Hallway activities, including walking marching with alt. LE touches/ lateral walking with agility ladder feedback/ tandem walking/ turning/ picking up objects/ [employer] tasks.  Functional cone reaching (varying heights/ distances)- SBA for safety and cuing.  Obstacle course in hallway.                          Pt response for medical necessity: benefits from generalized/ LE strengthening ex. Program with progressive balance tasks to improve safety/independence with walking.  No LOB but pt. Cautious with higher level balance/ functional reaching tasks.          PT Long Term Goals - 12/18/15 1011      PT LONG TERM GOAL #1   Title Pt. I with HEP to increase B LE muscle strength 1/2 muscle grade (4+/5 MMT) to improve standing tolerance/ independence with functional mobility.     Baseline B LE muscle strength grossly 4/5 MMT except quad 4+/5 MMT   Time 4   Period Weeks   Status New  PT LONG TERM GOAL #2   Title Pt. will increase LEFS to >35 out of 80 to improve pain-free mobility.     Baseline LEFS: 22 out of 80 on 8/2   Time 4   Period Weeks   Status New     PT LONG TERM GOAL #3   Title Pt. able to progress to an independent [employer] ex. program with no pain/ issues.     Baseline currently not ex.    Time 4   Period Weeks   Status New     PT LONG TERM GOAL #4   Title Pt. will demonstrate proper upright seated/ standing posture with no c/o neck/L shoulder pain.     Baseline poor posture (forward head/rounded sh.).     Time 4   Period Weeks   Status New           Plan - 12/29/15 1409    Rehab Potential Good   PT Frequency 2x / week   PT Duration 4 weeks   PT Treatment/Interventions  ADLs/Self Care Home Management;Neuromuscular re-education;Balance training;Therapeutic exercise;Therapeutic activities;Functional mobility training;Stair training;Gait training;DME Instruction;Patient/family education;Manual techniques;Passive range of motion;Energy conservation   PT Next Visit Plan Progress LE strengthening/ higher level balance tasks.     PT Home Exercise Plan See HEP   Consulted and Agree with Plan of Care Patient      Patient will benefit from skilled therapeutic intervention in order to improve the following deficits and impairments:  Abnormal gait, Decreased endurance, Decreased activity tolerance, Decreased strength, Difficulty walking, Decreased mobility, Decreased balance, Postural dysfunction, Impaired flexibility  Visit Diagnosis: Unsteady gait  Difficulty in walking, not elsewhere classified  Muscle weakness (generalized)     Problem List Patient Active Problem List   Diagnosis Date Noted  . TIA (transient ischemic attack) 10/17/2015  . Hyponatremia 10/17/2015  . Anemia 10/17/2015  . Chest pain 10/16/2015  . Left-sided weakness 10/16/2015  . Hypotension 10/16/2015  . Bradycardia 10/16/2015  . Hypoglycemia 10/16/2015    Cammie McgeeMichael C Tarry Blayney, PT, DPT # [id number]  12/30/2015, 5:50 PM  [place] [facility] [address] [place], [place], [address] Phone: [phone]   Fax:  [phone]  Name: Christine BestShobhna J Zimmerman MRN: [id number] Date of Birth: 06/21/1945

## 2015-12-31 ENCOUNTER — Ambulatory Visit: Payer: Medicare Other | Admitting: Physical Therapy

## 2015-12-31 DIAGNOSIS — R262 Difficulty in walking, not elsewhere classified: Secondary | ICD-10-CM

## 2015-12-31 DIAGNOSIS — R2681 Unsteadiness on feet: Secondary | ICD-10-CM | POA: Diagnosis not present

## 2015-12-31 DIAGNOSIS — M6281 Muscle weakness (generalized): Secondary | ICD-10-CM

## 2016-01-01 NOTE — Therapy (Addendum)
[place] [facility] [address] [place], [place], [address] Phone: [phone]   Fax:  [phone]  Physical Therapy Treatment  Patient Details  Name: Christine BestShobhna J Wambold MRN: [id number] Date of Birth: 12/04/1945 Referring Provider: Dr. Cristopher PeruHemang Shah  Encounter Date: 12/31/2015      PT End of Session - 01/01/16 1938    Visit Number 5   Number of Visits 8   Date for PT Re-Evaluation 01/14/16   Authorization - Visit Number 5   Authorization - Number of Visits 10   PT Start Time 1106   PT Stop Time 1158   PT Time Calculation (min) 52 min   Equipment Utilized During Treatment Gait belt   Activity Tolerance Patient tolerated treatment well;Patient limited by fatigue   Behavior During Therapy [facility] for tasks assessed/performed      Past Medical History:  Diagnosis Date  . Coronary artery disease   . Diabetes mellitus without complication (HCC)   . GERD (gastroesophageal reflux disease)   . High cholesterol   . Hypertension   . Hypothyroidism     Past Surgical History:  Procedure Laterality Date  . BACK SURGERY    . CARDIAC CATHETERIZATION Left 10/23/2015   Procedure: Left Heart Cath and Coronary Angiography;  Surgeon: Alwyn Peawayne D Callwood, MD;  Location: [facility] INVASIVE CV LAB;  Service: Cardiovascular;  Laterality: Left;  . CORONARY STENT PLACEMENT     [facility]    There were no vitals filed for this visit.      Subjective Assessment - 01/01/16 1938    Limitations Lifting;Standing;Walking;House hold activities   Patient Stated Goals Increase B LE muscle strength/ improve balance/ gait.      Pt. c/o 4-5/10 LBP prior to tx. session (at rest).  Pt. states she attended a [employer] ex. class yesterday and had difficulty with several of the ex.  Pt. able to complete 30 min. of the ex. program at gym but fatigued.  No recent falls reported.  Pt. states L leg will occasionally feel "heavy" with prolonged  standing/walking tasks at home.     OBJECTIVE: There.ex.: Nustep L5 10 min. B UE/LE (warm-up/ no charge).  Step ups/down (forward/ lateral) with 1 UE assist 20x in //-bars (mirror feedback/ posture correction). Ascend/descend 4 steps with recip. Pattern and 1 handrail safely (increase c/o LBP reported).  Standing marching/ knee flexion/ hip abd. 20x each (increase c/o L LE discomfort and "heaviness").  See new HEP (issued red theraband).  Neuro mm.:  Hallway activities, including walking marching with alt. LE touches/ lateral walking with agility ladder feedback/ tandem walking/ Toe and heel taps on 6" step in //-bars with no UE assist.    Pt response for medical necessity: benefits from generalized/ LE strengthening ex. Program with progressive balance tasks to improve safety/independence with walking. No LOB but pt. Cautious with higher level balance/ functional reaching tasks.       Pt. demonstrates good posture/ technique with addition of postural/ resisted ex. program (issued RTB).  Increase ability to maintain tandem/ SLS in //-bars with no UE assist.  Several short seated rest breaks required to complete standing ex./ balance tasks.  PT emphasized the importance of continuing with HEP and benefits of participating with [employer] 2-3x/week.          PT Long Term Goals - 12/18/15 1011      PT LONG TERM GOAL #1   Title Pt. I  with HEP to increase B LE muscle strength 1/2 muscle grade (4+/5 MMT) to improve standing tolerance/ independence with functional mobility.     Baseline B LE muscle strength grossly 4/5 MMT except quad 4+/5 MMT   Time 4   Period Weeks   Status New     PT LONG TERM GOAL #2   Title Pt. will increase LEFS to >35 out of 80 to improve pain-free mobility.     Baseline LEFS: 22 out of 80 on 8/2   Time 4   Period Weeks   Status New     PT LONG TERM GOAL #3   Title Pt. able to progress to an independent [employer] ex.  program with no pain/ issues.     Baseline currently not ex.    Time 4   Period Weeks   Status New     PT LONG TERM GOAL #4   Title Pt. will demonstrate proper upright seated/ standing posture with no c/o neck/L shoulder pain.     Baseline poor posture (forward head/rounded sh.).     Time 4   Period Weeks   Status New               Plan - 01/01/16 1939    Rehab Potential Good   PT Frequency 2x / week   PT Duration 4 weeks   PT Treatment/Interventions ADLs/Self Care Home Management;Neuromuscular re-education;Balance training;Therapeutic exercise;Therapeutic activities;Functional mobility training;Stair training;Gait training;DME Instruction;Patient/family education;Manual techniques;Passive range of motion;Energy conservation   PT Next Visit Plan Progress LE strengthening/ higher level balance tasks.     PT Home Exercise Plan See HEP   Consulted and Agree with Plan of Care Patient      Patient will benefit from skilled therapeutic intervention in order to improve the following deficits and impairments:  Abnormal gait, Decreased endurance, Decreased activity tolerance, Decreased strength, Difficulty walking, Decreased mobility, Decreased balance, Postural dysfunction, Impaired flexibility  Visit Diagnosis: Unsteady gait  Difficulty in walking, not elsewhere classified  Muscle weakness (generalized)     Problem List Patient Active Problem List   Diagnosis Date Noted  . TIA (transient ischemic attack) 10/17/2015  . Hyponatremia 10/17/2015  . Anemia 10/17/2015  . Chest pain 10/16/2015  . Left-sided weakness 10/16/2015  . Hypotension 10/16/2015  . Bradycardia 10/16/2015  . Hypoglycemia 10/16/2015   Cammie McgeeMichael C Yolanda Dockendorf, PT, DPT # [id number]  01/01/2016, 7:41 PM  [place] [facility] [address] [place], [place], [address] Phone: [phone]   Fax:  [phone]  Name: Christine BestShobhna J Lingo MRN: [id number] Date of Birth:  04/01/1946

## 2016-01-05 ENCOUNTER — Ambulatory Visit: Payer: Medicare Other | Admitting: Physical Therapy

## 2016-01-05 ENCOUNTER — Encounter: Payer: Self-pay | Admitting: Physical Therapy

## 2016-01-07 ENCOUNTER — Ambulatory Visit: Payer: Medicare Other | Admitting: Physical Therapy

## 2016-01-07 ENCOUNTER — Encounter: Payer: Self-pay | Admitting: Physical Therapy

## 2016-01-07 DIAGNOSIS — R262 Difficulty in walking, not elsewhere classified: Secondary | ICD-10-CM

## 2016-01-07 DIAGNOSIS — R2681 Unsteadiness on feet: Secondary | ICD-10-CM

## 2016-01-07 DIAGNOSIS — M6281 Muscle weakness (generalized): Secondary | ICD-10-CM

## 2016-01-07 NOTE — Therapy (Signed)
[facility] [address] [place], [place], [address] Phone: [phone]   Fax:  [phone]  Physical Therapy Treatment  Patient Details  Name: Christine Zimmerman MRN: [id number] Date of Birth: 09-Feb-1946 Referring Provider: Dr. Jennings Books  Encounter Date: 01/07/2016      PT End of Session - 01/07/16 1729    Visit Number 6   Number of Visits 8   Date for PT Re-Evaluation 01/14/16   Authorization - Visit Number 6   Authorization - Number of Visits 10   PT Start Time 1114   PT Stop Time 5170   PT Time Calculation (min) 50 min   Equipment Utilized During Treatment Gait belt   Activity Tolerance Patient tolerated treatment well;Patient limited by fatigue;Patient limited by pain   Behavior During Therapy [facility] for tasks assessed/performed      Past Medical History:  Diagnosis Date  . Coronary artery disease   . Diabetes mellitus without complication ([place])   . GERD (gastroesophageal reflux disease)   . High cholesterol   . Hypertension   . Hypothyroidism     Past Surgical History:  Procedure Laterality Date  . BACK SURGERY    . CARDIAC CATHETERIZATION Left 10/23/2015   Procedure: Left Heart Cath and Coronary Angiography;  Surgeon: Yolonda Kida, MD;  Location: [place] [facility];  Service: Cardiovascular;  Laterality: Left;  . CORONARY STENT PLACEMENT     x 4 at [facility]. x 1 by [facility] in [facility]    There were no vitals filed for this visit.      Subjective Assessment - 01/07/16 1722    Subjective Pt reports low back and L shoulder pain which she experiences regularly; states that she has not been feeling well over the last week. She went in to see her MD but has not reported feeling much better.    Limitations Lifting;Standing;Walking;House hold activities   Patient Stated Goals Increase B LE muscle strength/ improve balance/ gait.   Currently in Pain? Yes   Pain Score 5    Pain Location Back   Pain Orientation  Lower   Multiple Pain Sites Yes   Pain Score 5   Pain Location Other (Comment)  medial scapular region   Pain Orientation Left      Objective:  Vitals: After 5 minutes standing exercise BP 154/59. HR 57. O2 100%. After seated rest break: BP 144/60. HR 60. O2: 100%.   Therapeutic Exercise: Ascend/Descend 4 6'' steps x4 with light touch of bilateral UE. Seated knee extension 2.5# 3x10. Seated hip flexion 2.5# 3x10. Manaul resistance seated hip flexion/knee flexion/knee extension.  Neuromuscular Re-ed: Forward/backward step ups on 6'' step with light touch BUE in // bars. Tandem walking in // bars 39f x4. Tandem balance in // bars 30 sec x6 BLE.   Pt response for medical necessity: Pt demonstrates decreased exercise tolerance this session secondary to onset of LBP, L shoulder pain and headache. She is able to perform all tasks, but shows better tolerance of seated exercises this session; only able to perform standing tasks for <5 minutes at a time.       PT Long Term Goals - 01/07/16 1732      PT LONG TERM GOAL #1   Title Pt. I with HEP to increase B LE muscle strength 1/2 muscle grade (4+/5 MMT) to improve standing tolerance/ independence with functional mobility.     Baseline B LE muscle strength grossly 4/5 MMT except quad  4+/5 MMT   Time 4   Period Weeks   Status Partially Met     PT LONG TERM GOAL #2   Title Pt. will increase LEFS to >35 out of 80 to improve pain-free mobility.     Baseline LEFS: 22 out of 80 on 8/2. 8/23: 22/80   Time 4   Period Weeks   Status New     PT LONG TERM GOAL #3   Title Pt. able to progress to an independent [employer] ex. program with no pain/ issues.     Baseline currently not ex.    Time 4   Period Weeks   Status Partially Met     PT LONG TERM GOAL #4   Title Pt. will demonstrate proper upright seated/ standing posture with no c/o neck/L shoulder pain.     Baseline poor posture (forward head/rounded sh.).     Time 4   Period  Weeks   Status Partially Met               Plan - 01/07/16 1730    Clinical Impression Statement Pt requires frequent seated rest breaks this session secondary to onset of L scapular pain, low back pain and "pounding" in her head. Pt demonstrates good balance technique this session with better tolerance of seated tasks secondary to headache pain.   Rehab Potential Good   PT Frequency 2x / week   PT Duration 4 weeks   PT Treatment/Interventions ADLs/Self Care Home Management;Neuromuscular re-education;Balance training;Therapeutic exercise;Therapeutic activities;Functional mobility training;Stair training;Gait training;DME Instruction;Patient/family education;Manual techniques;Passive range of motion;Energy conservation   PT Next Visit Plan Progress LE strengthening/ higher level balance tasks.     PT Home Exercise Plan See HEP   Consulted and Agree with Plan of Care Patient      Patient will benefit from skilled therapeutic intervention in order to improve the following deficits and impairments:  Abnormal gait, Decreased endurance, Decreased activity tolerance, Decreased strength, Difficulty walking, Decreased mobility, Decreased balance, Postural dysfunction, Impaired flexibility  Visit Diagnosis: Unsteady gait  Difficulty in walking, not elsewhere classified  Muscle weakness (generalized)     Problem List Patient Active Problem List   Diagnosis Date Noted  . TIA (transient ischemic attack) 10/17/2015  . Hyponatremia 10/17/2015  . Anemia 10/17/2015  . Chest pain 10/16/2015  . Left-sided weakness 10/16/2015  . Hypotension 10/16/2015  . Bradycardia 10/16/2015  . Hypoglycemia 10/16/2015   Pura Spice, PT, DPT # [phone] Christine Zimmerman SPT 01/07/2016, 5:33 PM   [facility] [address] [place], [place], [address] Phone: [phone]   Fax:  [phone]  Name: Christine Zimmerman MRN: [id number] Date of Birth:  11/20/1945

## 2016-01-13 ENCOUNTER — Ambulatory Visit: Payer: Medicare Other | Admitting: Physical Therapy

## 2016-01-13 DIAGNOSIS — R2681 Unsteadiness on feet: Secondary | ICD-10-CM | POA: Diagnosis not present

## 2016-01-13 DIAGNOSIS — M6281 Muscle weakness (generalized): Secondary | ICD-10-CM

## 2016-01-13 DIAGNOSIS — R262 Difficulty in walking, not elsewhere classified: Secondary | ICD-10-CM

## 2016-01-14 ENCOUNTER — Encounter: Payer: Self-pay | Admitting: Physical Therapy

## 2016-01-14 NOTE — Therapy (Signed)
[facility] [address] [place], [place], [address] Phone: [phone]   Fax:  [phone]  Physical Therapy Treatment  Patient Details  Name: Christine Zimmerman MRN: [id number] Date of Birth: December 31, 1945 Referring Provider: Dr. Jennings Books  Encounter Date: 01/13/2016      PT End of Session - 01/14/16 1457    Visit Number 7   Number of Visits 8   Date for PT Re-Evaluation 01/14/16   Authorization - Visit Number 7   Authorization - Number of Visits 10   PT Start Time 1046   PT Stop Time 1149   PT Time Calculation (min) 63 min   Equipment Utilized During Treatment Gait belt   Activity Tolerance Patient tolerated treatment well;Patient limited by fatigue;Patient limited by pain   Behavior During Therapy [facility] for tasks assessed/performed      Past Medical History:  Diagnosis Date  . Coronary artery disease   . Diabetes mellitus without complication ([place])   . GERD (gastroesophageal reflux disease)   . High cholesterol   . Hypertension   . Hypothyroidism     Past Surgical History:  Procedure Laterality Date  . BACK SURGERY    . CARDIAC CATHETERIZATION Left 10/23/2015   Procedure: Left Heart Cath and Coronary Angiography;  Surgeon: Yolonda Kida, MD;  Location: [place] [facility];  Service: Cardiovascular;  Laterality: Left;  . CORONARY STENT PLACEMENT     x 4 at [facility]. x 1 by [facility] in 2004    There were no vitals filed for this visit.      Subjective Assessment - 01/14/16 1457    Subjective Pt. states she is tired today and L shoulder discomfort reported at rest and without use of Nustep.     Limitations Lifting;Standing;Walking;House hold activities   Patient Stated Goals Increase B LE muscle strength/ improve balance/ gait.   Currently in Pain? Yes   Pain Score 5    Pain Location Shoulder   Pain Orientation Left      Objective:  Vitals: BP prior to tx. Session 164/65.  Pulse: 71 bpm. O2 sat. 100%.     Therapeutic Exercise: Standing marching/ hip abd./ seated LAQ/ heel raises 25x each.  Sit to stands from green chair 10x.  Standing B shoulder flexion/ abd. (reassessment of L shoulder/ lumbar ROM).  Nustep L4-5 10 min. (fatigue in B LE).      Neuromuscular Re-ed: Lateral walking/ braiding in //-bars with no UE assist.  Tandem walking in // bars 47f x4 (forwards and backwards)- cuing for posture and mirror feedback. Tandem balance in // bars 30 sec x6 BLE.   Pt response for medical necessity: Berg balance test limited by increase c/o LBP, L shoulder pain and generalized muscle fatigue. She is able to perform all tasks, but shows better tolerance of seated exercises this session; only able to perform standing tasks for <5 minutes at a time.  PT encouraged pt. To remain compliant with daily walking/ HEP.          PT Long Term Goals - 01/07/16 1732      PT LONG TERM GOAL #1   Title Pt. I with HEP to increase B LE muscle strength 1/2 muscle grade (4+/5 MMT) to improve standing tolerance/ independence with functional mobility.     Baseline B LE muscle strength grossly 4/5 MMT except quad 4+/5 MMT   Time 4   Period Weeks   Status Partially Met  PT LONG TERM GOAL #2   Title Pt. will increase LEFS to >35 out of 80 to improve pain-free mobility.     Baseline LEFS: 22 out of 80 on 8/2. 8/23: 22/80   Time 4   Period Weeks   Status New     PT LONG TERM GOAL #3   Title Pt. able to progress to an independent [employer] ex. program with no pain/ issues.     Baseline currently not ex.    Time 4   Period Weeks   Status Partially Met     PT LONG TERM GOAL #4   Title Pt. will demonstrate proper upright seated/ standing posture with no c/o neck/L shoulder pain.     Baseline poor posture (forward head/rounded sh.).     Time 4   Period Weeks   Status Partially Met           Plan - 01/14/16 1824    Clinical Impression Statement Pt. scored a 43/56 on Berg balance assessment  (overall score limited by increase c/o low back pain with picking up object/ functional reaching and turning.).  Persistent c/o L shoulder discomfort t/o tx. session and balance ex.  Several short seated rest breaks required with standing ther.ex./ balance tasks.  Pt. ambulating with slow, antalgic gait pattern with decrease hip/knee flexion with increase distance walked.     Rehab Potential Good   PT Frequency 2x / week   PT Duration 4 weeks   PT Treatment/Interventions ADLs/Self Care Home Management;Neuromuscular re-education;Balance training;Therapeutic exercise;Therapeutic activities;Functional mobility training;Stair training;Gait training;DME Instruction;Patient/family education;Manual techniques;Passive range of motion;Energy conservation   PT Next Visit Plan Progress LE strengthening/ higher level balance tasks.  RECERT NEXT TX SESSION/ CHECK GOALS/ UPDATE HEP   PT Home Exercise Plan See HEP   Consulted and Agree with Plan of Care Patient      Patient will benefit from skilled therapeutic intervention in order to improve the following deficits and impairments:  Abnormal gait, Decreased endurance, Decreased activity tolerance, Decreased strength, Difficulty walking, Decreased mobility, Decreased balance, Postural dysfunction, Impaired flexibility  Visit Diagnosis: Unsteady gait  Difficulty in walking, not elsewhere classified  Muscle weakness (generalized)     Problem List Patient Active Problem List   Diagnosis Date Noted  . TIA (transient ischemic attack) 10/17/2015  . Hyponatremia 10/17/2015  . Anemia 10/17/2015  . Chest pain 10/16/2015  . Left-sided weakness 10/16/2015  . Hypotension 10/16/2015  . Bradycardia 10/16/2015  . Hypoglycemia 10/16/2015   Pura Spice, PT, DPT # [phone]  01/14/2016, 8:36 PM  [facility] [address] [place], [place], [address] Phone: [phone]   Fax:  [phone]  Name: Christine Zimmerman MRN: [id number] Date of Birth: June 22, 1945

## 2016-01-15 ENCOUNTER — Encounter: Payer: Self-pay | Admitting: Physical Therapy

## 2016-01-15 ENCOUNTER — Ambulatory Visit: Payer: Medicare Other | Admitting: Physical Therapy

## 2016-01-15 DIAGNOSIS — R262 Difficulty in walking, not elsewhere classified: Secondary | ICD-10-CM

## 2016-01-15 DIAGNOSIS — M6281 Muscle weakness (generalized): Secondary | ICD-10-CM

## 2016-01-15 DIAGNOSIS — R2681 Unsteadiness on feet: Secondary | ICD-10-CM | POA: Diagnosis not present

## 2016-01-15 NOTE — Addendum Note (Signed)
Addended by: Dorene GrebeSHERK, Yaa Donnellan C on: 01/15/2016 01:59 PM   Modules accepted: Orders

## 2016-01-15 NOTE — Therapy (Signed)
[facility] [address] [place], [place], [address] Phone: [phone]   Fax:  [phone]  Physical Therapy Treatment  Patient Details  Name: Christine Zimmerman MRN: [id number] Date of Birth: 1945/12/14 Referring Provider: Dr. Jennings Books  Encounter Date: 01/15/2016      PT End of Session - 01/15/16 1205    Visit Number 8   Number of Visits 15   Date for PT Re-Evaluation 02/12/16   Authorization - Visit Number 8   Authorization - Number of Visits 17   PT Start Time 1856   PT Stop Time 1035   PT Time Calculation (min) 61 min   Equipment Utilized During Treatment Gait belt   Activity Tolerance Patient tolerated treatment well;Patient limited by fatigue;Patient limited by pain   Behavior During Therapy [facility] for tasks assessed/performed      Past Medical History:  Diagnosis Date  . Coronary artery disease   . Diabetes mellitus without complication ([place])   . GERD (gastroesophageal reflux disease)   . High cholesterol   . Hypertension   . Hypothyroidism     Past Surgical History:  Procedure Laterality Date  . BACK SURGERY    . CARDIAC CATHETERIZATION Left 10/23/2015   Procedure: Left Heart Cath and Coronary Angiography;  Surgeon: Yolonda Kida, MD;  Location: [place] [facility];  Service: Cardiovascular;  Laterality: Left;  . CORONARY STENT PLACEMENT     x 4 at [facility]. x 1 by [facility] in 2004    There were no vitals filed for this visit.      Subjective Assessment - 01/15/16 1020    Subjective Pt states she is feeling ok today, with mild overall fatigue. No new complaints. States that she has not performed her exercise program since last appointment.   Limitations Lifting;Standing;Walking;House hold activities   Patient Stated Goals Increase B LE muscle strength/ improve balance/ gait.   Currently in Pain? No/denies     Objective:  Neuromuscular Re-ed: tandem walking 50f x4 (forwards and backwards), modified  grape vine (cross front/cross back) 144fx4, grape fine 1246f4. Static balance on air ex pad with lumbar rotation #2 medicine ball x20, cross body diagonals x10. Dynamic balance on air ex with anterior cone taps x30 LLE as stance leg; lateral cone taps x30 R/L ea. As stance leg. Obstacle course: lateral step over 2 air ex pads, 2 6'' cones x4. All balance activities performed with SBA in // bars with no UE support.  Vitals after tandem walking: BP 125/48 HR 56  Pt performed NuStep Level 5, 10 minutes (warm up/no charge).  Pt response for medical necessity: Pt demonstrates good tolerance of upright activity this session with only mild c/o of low back pain with lumbar rotation. She experiences 1 LOB in side stepping/obstacle course exercise but is able to self correct with UE support.           PT Long Term Goals - 01/15/16 1356      PT LONG TERM GOAL #1   Title Pt. I with HEP to increase B LE muscle strength 1/2 muscle grade (4+/5 MMT) to improve standing tolerance/ independence with functional mobility.     Baseline B LE muscle strength grossly 4/5 MMT except quad 4+/5 MMT   Time 4   Period Weeks   Status Partially Met     PT LONG TERM GOAL #2   Title Pt. will increase LEFS to >35 out of 80 to improve pain-free  mobility.     Baseline LEFS: 22 out of 80 on 8/2. 8/23: 22/80   Time 4   Period Weeks   Status Partially Met     PT LONG TERM GOAL #3   Title Pt. able to progress to an independent [employer] ex. program with no pain/ issues.     Baseline pt has attempted 1x   Time 4   Period Weeks   Status Not Met     PT LONG TERM GOAL #4   Title Pt. will demonstrate proper upright seated/ standing posture with no c/o neck/L shoulder pain.     Baseline poor posture (forward head/rounded sh.).     Time 4   Period Weeks   Status Partially Met            Plan - Feb 07, 2016 1206    Clinical Impression Statement Pt demonstrates overall improvement in balance/coordination  since therapy onset but continues to demonstrate instability of LLE, decreased strength in LLE and impairments in functional mobility. She has difficulty with dynamic balance tasks that incorporate prolonged stance on LLE; demonstrates improvement with repeated practice. Pt requires several seated rest breaks throughout therapy session secondary to "head pounding."   Rehab Potential Good   PT Frequency 2x / week   PT Duration 4 weeks   PT Treatment/Interventions ADLs/Self Care Home Management;Neuromuscular re-education;Balance training;Therapeutic exercise;Therapeutic activities;Functional mobility training;Stair training;Gait training;DME Instruction;Patient/family education;Manual techniques;Passive range of motion;Energy conservation   PT Next Visit Plan Progress LE strengthening/ higher level balance tasks.    PT Home Exercise Plan See HEP   Consulted and Agree with Plan of Care Patient      Patient will benefit from skilled therapeutic intervention in order to improve the following deficits and impairments:  Abnormal gait, Decreased endurance, Decreased activity tolerance, Decreased strength, Difficulty walking, Decreased mobility, Decreased balance, Postural dysfunction, Impaired flexibility  Visit Diagnosis: Unsteady gait  Difficulty in walking, not elsewhere classified  Muscle weakness (generalized)       G-Codes - 02-07-16 1356    Functional Assessment Tool Used Berg balance test/ LEFS/ gait difficulty/ muscle weakness   Functional Limitation Mobility: Walking and moving around   Mobility: Walking and Moving Around Current Status [phone]) At least 20 percent but less than 40 percent impaired, limited or restricted   Mobility: Walking and Moving Around Goal Status [phone]) At least 1 percent but less than 20 percent impaired, limited or restricted      Problem List Patient Active Problem List   Diagnosis Date Noted  . TIA (transient ischemic attack) 10/17/2015  . Hyponatremia  10/17/2015  . Anemia 10/17/2015  . Chest pain 10/16/2015  . Left-sided weakness 10/16/2015  . Hypotension 10/16/2015  . Bradycardia 10/16/2015  . Hypoglycemia 10/16/2015   Pura Spice, PT, DPT # [phone] Derrill Memo, SPT 02/07/16, 1:57 PM  [place] [facility] [address] [place], [place], [address] Phone: [phone]   Fax:  [phone]  Name: Christine Zimmerman MRN: [id number] Date of Birth: [birth year]

## 2016-01-20 ENCOUNTER — Ambulatory Visit: Payer: Medicare Other | Attending: Neurology | Admitting: Physical Therapy

## 2016-01-20 ENCOUNTER — Encounter: Payer: Self-pay | Admitting: Physical Therapy

## 2016-01-20 DIAGNOSIS — R262 Difficulty in walking, not elsewhere classified: Secondary | ICD-10-CM | POA: Diagnosis present

## 2016-01-20 DIAGNOSIS — M6281 Muscle weakness (generalized): Secondary | ICD-10-CM | POA: Diagnosis present

## 2016-01-20 DIAGNOSIS — R2681 Unsteadiness on feet: Secondary | ICD-10-CM | POA: Diagnosis not present

## 2016-01-20 NOTE — Therapy (Signed)
[facility] [address] [place], [place], [address] Phone: [phone]   Fax:  [phone]  Physical Therapy Treatment  Patient Details  Name: Christine Zimmerman MRN: [id number] Date of Birth: 1946-05-09 Referring Provider: Dr. Jennings Books  Encounter Date: 01/20/2016      PT End of Session - 01/20/16 1532    Visit Number 9   Number of Visits 16   Date for PT Re-Evaluation 02/12/16   Authorization - Visit Number 9   Authorization - Number of Visits 17   PT Start Time 1110   PT Stop Time 1159   PT Time Calculation (min) 49 min   Equipment Utilized During Treatment Gait belt   Activity Tolerance Patient tolerated treatment well;Patient limited by fatigue   Behavior During Therapy [facility] for tasks assessed/performed      Past Medical History:  Diagnosis Date  . Coronary artery disease   . Diabetes mellitus without complication ([place])   . GERD (gastroesophageal reflux disease)   . High cholesterol   . Hypertension   . Hypothyroidism     Past Surgical History:  Procedure Laterality Date  . BACK SURGERY    . CARDIAC CATHETERIZATION Left 10/23/2015   Procedure: Left Heart Cath and Coronary Angiography;  Surgeon: Yolonda Kida, MD;  Location: [place] [facility];  Service: Cardiovascular;  Laterality: Left;  . CORONARY STENT PLACEMENT     x 4 at [facility]. x 1 by [facility] in 2004    There were no vitals filed for this visit.      Subjective Assessment - 01/20/16 1531    Subjective Pt states she has some mild pain in L scapular regoin over the weekend, states it feels "like a lump." Inconsistent with reports of whether or not she performed HEP.   Limitations Lifting;Standing;Walking;House hold activities   Patient Stated Goals Increase B LE muscle strength/ improve balance/ gait.   Currently in Pain? No/denies     Objective:   Therapeutic Exercise: Hip flexor stretch x45 sec (pt unable to tolerate due to increase in LBP).  Quad stretch x45 sec (pt with onset of LBP). Hamstring stretch 60sec x2.  Neuromuscular Re-ed: Coordination drills with backwards/lateral/diagonal step with no UE support on grid pattern (8 minutes). 6'' steps over cones 3x20 lateral/forwards. Static balance on BOSU 1 minute x4. Static balance on air ex with eyes closed 1 minute x4. Tandem stance 15sec x4 eyes closed (pt unable to hold); Partial tandem stance 30sec x2 eyes closed.  Pt response for medical necessity: Pt demonstrates good exercise tolerance this session and is able to perform all activities without a seated rest break. She experiences onset of lateral hip and low back pain with hip flexor stretch positioning in standing/sidelying. Pt with relief from hamstring stretch bilaterally.       PT Long Term Goals - 01/15/16 1356      PT LONG TERM GOAL #1   Title Pt. I with HEP to increase B LE muscle strength 1/2 muscle grade (4+/5 MMT) to improve standing tolerance/ independence with functional mobility.     Baseline B LE muscle strength grossly 4/5 MMT except quad 4+/5 MMT   Time 4   Period Weeks   Status Partially Met     PT LONG TERM GOAL #2   Title Pt. will increase LEFS to >35 out of 80 to improve pain-free mobility.     Baseline LEFS: 22 out of 80 on 8/2. 8/23: 22/80  Time 4   Period Weeks   Status Partially Met     PT LONG TERM GOAL #3   Title Pt. able to progress to an independent [employer] ex. program with no pain/ issues.     Baseline pt has attempted 1x   Time 4   Period Weeks   Status Not Met     PT LONG TERM GOAL #4   Title Pt. will demonstrate proper upright seated/ standing posture with no c/o neck/L shoulder pain.     Baseline poor posture (forward head/rounded sh.).     Time 4   Period Weeks   Status Partially Met               Plan - 01/20/16 1533    Clinical Impression Statement Pt able to perform multiple balance/coordination drills without UE support this session. Continues to  be limited by instability of LLE during stance. Pt does not require seated rest break this session.    Rehab Potential Good   PT Frequency 2x / week   PT Duration 4 weeks   PT Treatment/Interventions ADLs/Self Care Home Management;Neuromuscular re-education;Balance training;Therapeutic exercise;Therapeutic activities;Functional mobility training;Stair training;Gait training;DME Instruction;Patient/family education;Manual techniques;Passive range of motion;Energy conservation   PT Next Visit Plan Progress LE strengthening/ higher level balance tasks.    PT Home Exercise Plan See HEP   Consulted and Agree with Plan of Care Patient      Patient will benefit from skilled therapeutic intervention in order to improve the following deficits and impairments:  Abnormal gait, Decreased endurance, Decreased activity tolerance, Decreased strength, Difficulty walking, Decreased mobility, Decreased balance, Postural dysfunction, Impaired flexibility  Visit Diagnosis: Unsteady gait  Difficulty in walking, not elsewhere classified  Muscle weakness (generalized)     Problem List Patient Active Problem List   Diagnosis Date Noted  . TIA (transient ischemic attack) 10/17/2015  . Hyponatremia 10/17/2015  . Anemia 10/17/2015  . Chest pain 10/16/2015  . Left-sided weakness 10/16/2015  . Hypotension 10/16/2015  . Bradycardia 10/16/2015  . Hypoglycemia 10/16/2015   Pura Spice, PT, DPT # [phone] Mickel Baas Meleni Delahunt SPT 01/20/2016, 3:37 PM  [place] [facility] [address] [place], [place], [address] Phone: [phone]   Fax:  [phone]  Name: Christine Zimmerman MRN: [id number] Date of Birth: 26-Nov-1945

## 2016-01-22 ENCOUNTER — Ambulatory Visit: Payer: Medicare Other | Admitting: Physical Therapy

## 2016-01-22 ENCOUNTER — Encounter: Payer: Self-pay | Admitting: Physical Therapy

## 2016-01-22 DIAGNOSIS — M6281 Muscle weakness (generalized): Secondary | ICD-10-CM

## 2016-01-22 DIAGNOSIS — R262 Difficulty in walking, not elsewhere classified: Secondary | ICD-10-CM

## 2016-01-22 DIAGNOSIS — R2681 Unsteadiness on feet: Secondary | ICD-10-CM | POA: Diagnosis not present

## 2016-01-22 NOTE — Therapy (Signed)
[place] [facility] [address] [place], [place], [address] Phone: [phone]   Fax:  [phone]  Physical Therapy Treatment  Patient Details  Name: Christine Zimmerman MRN: [id number] Date of Birth: [birth year] Referring Provider: Dr. Jennings Books  Encounter Date: 01/22/2016      PT End of Session - 01/22/16 1307    Visit Number 10   Number of Visits 16   Date for PT Re-Evaluation 02/12/16   Authorization - Visit Number 10   Authorization - Number of Visits 17   PT Start Time 1101   PT Stop Time 1159   PT Time Calculation (min) 58 min   Equipment Utilized During Treatment Gait belt   Activity Tolerance Patient tolerated treatment well;Patient limited by fatigue   Behavior During Therapy [facility] for tasks assessed/performed      Past Medical History:  Diagnosis Date  . Coronary artery disease   . Diabetes mellitus without complication ([place])   . GERD (gastroesophageal reflux disease)   . High cholesterol   . Hypertension   . Hypothyroidism     Past Surgical History:  Procedure Laterality Date  . BACK SURGERY    . CARDIAC CATHETERIZATION Left 10/23/2015   Procedure: Left Heart Cath and Coronary Angiography;  Surgeon: Yolonda Kida, MD;  Location: [place] [facility];  Service: Cardiovascular;  Laterality: Left;  . CORONARY STENT PLACEMENT     x 4 at [facility]. x 1 by [facility] in 2004    There were no vitals filed for this visit.      Subjective Assessment - 01/22/16 1307    Subjective Pt states she is feeling well overall; says she got good rest the night prior but has not been able to perform HEP yet. States that when her back pain gets bad, she gets relief lying flat on the floor on her back.    Limitations Lifting;Standing;Walking;House hold activities   Patient Stated Goals Increase B LE muscle strength/ improve balance/ gait.   Currently in Pain? No/denies     Objective:   Therapeutic Exercise: Nu Step Level 5, 10  minutes (warm up/no charge) Climb/descend 4 6'' steps x12 (1 #5 weight with no UE support x4, 2 #5 weight no UE support leading with RLE on descent x4, 2 #5 weight no UE support leading with LLE on descent). Pt demonstrates tendency for lateral trunk lean and side stepping with descent leading with LLE; responsive to cueing but pt remains fearful of falling. Standing marching (4 minutes) 2-3#. Weighted squats 2-3# x20. Step downs on 6'' step in // bars 2-3# x5. Pt c/o of onset of L sided low back pain, requires a seated rest break. Lateral step up/down on 6'' step 2-3# x10, pt requiring seated rest break secondary to LBP. Standing lumbar ext x10. Pt unable to tolerate sidelying hip flexor stretch or standing hip flexor stretch secondary to onset of back and lateral leg pain bilaterally.   Pt response for medical necessity: Pt demonstrates good initial tolerance to therapeutic exercise and stair climbing/descending task. With fatigue and sustained upright posture she experiences onset of LBP. She experiences positive response to standing lumbar extensions with decrease in LBP sx.        PT Long Term Goals - 01/15/16 1356      PT LONG TERM GOAL #1   Title Pt. I with HEP to increase B LE muscle strength 1/2 muscle grade (4+/5 MMT) to improve standing tolerance/ independence with functional mobility.  Baseline B LE muscle strength grossly 4/5 MMT except quad 4+/5 MMT   Time 4   Period Weeks   Status Partially Met     PT LONG TERM GOAL #2   Title Pt. will increase LEFS to >35 out of 80 to improve pain-free mobility.     Baseline LEFS: 22 out of 80 on 8/2. 8/23: 22/80   Time 4   Period Weeks   Status Partially Met     PT LONG TERM GOAL #3   Title Pt. able to progress to an independent [employer] ex. program with no pain/ issues.     Baseline pt has attempted 1x   Time 4   Period Weeks   Status Not Met     PT LONG TERM GOAL #4   Title Pt. will demonstrate proper upright seated/  standing posture with no c/o neck/L shoulder pain.     Baseline poor posture (forward head/rounded sh.).     Time 4   Period Weeks   Status Partially Met            Plan - 01/22/16 1308    Clinical Impression Statement Pt is able to perform climb/descend 4 6'' steps without UE support with 2-5lb weights. She demonstrates mild instability with descent when leading with LLE but does not lose her balance or require UE assist. She initially uses lateral trunk lean and side stepping to navigate stair descent but is able to normalize gait strategy with cueing. Pt is more limited by fear of falling than LE stability.    Rehab Potential Good   PT Frequency 2x / week   PT Duration 4 weeks   PT Treatment/Interventions ADLs/Self Care Home Management;Neuromuscular re-education;Balance training;Therapeutic exercise;Therapeutic activities;Functional mobility training;Stair training;Gait training;DME Instruction;Patient/family education;Manual techniques;Passive range of motion;Energy conservation   PT Next Visit Plan Progress LE strengthening/ higher level balance tasks.    PT Home Exercise Plan See HEP   Consulted and Agree with Plan of Care Patient      Patient will benefit from skilled therapeutic intervention in order to improve the following deficits and impairments:  Abnormal gait, Decreased endurance, Decreased activity tolerance, Decreased strength, Difficulty walking, Decreased mobility, Decreased balance, Postural dysfunction, Impaired flexibility  Visit Diagnosis: Unsteady gait  Difficulty in walking, not elsewhere classified  Muscle weakness (generalized)     Problem List Patient Active Problem List   Diagnosis Date Noted  . TIA (transient ischemic attack) 10/17/2015  . Hyponatremia 10/17/2015  . Anemia 10/17/2015  . Chest pain 10/16/2015  . Left-sided weakness 10/16/2015  . Hypotension 10/16/2015  . Bradycardia 10/16/2015  . Hypoglycemia 10/16/2015   Pura Spice,  PT, DPT # [id number] Mickel Baas Jatoria Kneeland SPT 01/22/2016, 1:19 PM  [place] [facility] [address] [place], [place], [address] Phone: [phone]   Fax:  [phone]  Name: JONIECE SMOTHERMAN MRN: [id number] Date of Birth: 11-03-1945

## 2016-01-27 ENCOUNTER — Encounter: Payer: Medicare Other | Admitting: Physical Therapy

## 2016-01-29 ENCOUNTER — Encounter: Payer: Self-pay | Admitting: Physical Therapy

## 2016-01-29 ENCOUNTER — Ambulatory Visit: Payer: Medicare Other | Admitting: Physical Therapy

## 2016-01-29 DIAGNOSIS — M6281 Muscle weakness (generalized): Secondary | ICD-10-CM

## 2016-01-29 DIAGNOSIS — R2681 Unsteadiness on feet: Secondary | ICD-10-CM

## 2016-01-29 DIAGNOSIS — R262 Difficulty in walking, not elsewhere classified: Secondary | ICD-10-CM

## 2016-01-29 NOTE — Therapy (Signed)
[place] [facility] [address] [place], [place], [address] Phone: [phone]   Fax:  [phone]  Physical Therapy Treatment  Patient Details  Name: Christine Zimmerman MRN: [id number] Date of Birth: 1946/01/31 Referring Provider: Dr. Jennings Books  Encounter Date: 01/29/2016      PT End of Session - 01/29/16 1209    Visit Number 11   Number of Visits 16   Date for PT Re-Evaluation 02/12/16   Authorization - Visit Number 11   Authorization - Number of Visits 17   PT Start Time 0350   PT Stop Time 1155   PT Time Calculation (min) 53 min   Activity Tolerance Patient tolerated treatment well;Patient limited by fatigue   Behavior During Therapy [facility] for tasks assessed/performed      Past Medical History:  Diagnosis Date  . Coronary artery disease   . Diabetes mellitus without complication ([place])   . GERD (gastroesophageal reflux disease)   . High cholesterol   . Hypertension   . Hypothyroidism     Past Surgical History:  Procedure Laterality Date  . BACK SURGERY    . CARDIAC CATHETERIZATION Left 10/23/2015   Procedure: Left Heart Cath and Coronary Angiography;  Surgeon: Yolonda Kida, MD;  Location: [place] [facility];  Service: Cardiovascular;  Laterality: Left;  . CORONARY STENT PLACEMENT     x 4 at [facility]. x 1 by [facility] in 2004    There were no vitals filed for this visit.      Subjective Assessment - 01/29/16 1058    Subjective Pt. reports she had an injection to her L shoulder yesterday for pain/inflammation.  Pt. states she is feeling okay, no new complaints.     Limitations Lifting;Standing;Walking;House hold activities   Patient Stated Goals Increase B LE muscle strength/ improve balance/ gait.   Currently in Pain? No/denies      Therapeutic Exercise: Nustep Level 5, 10 minutes (warm up/no charge)- LE fatigue reported. 2.5# B LE seated/standing ex. Program (hip flexion/ walking forward and backwards and  lateral/ heel raises/ seated LAQ/ hip flexion 20x each).  Weighted squats 2-3# x20. Step downs on 6'' step in // bars 2-3# x 5.  Standing lumbar ext x10. Standing prostretch 5x with holds.  Recip. Stair climbing with 2 6# dumbbells 3x.  Seated hamstring stretch to decrease muscle cramping.  Neuro:  Standing cone taps L/R with no UE assist.  Tandem/ SLS tasks in //-bars with no UE assist (no LOB).     Pt response for medical necessity: Pt demonstrates good initial tolerance to therapeutic exercise and stair climbing/descending task. With fatigue and sustained upright posture she experiences onset of LBP. She experiences positive response to standing lumbar extensions with decrease in LBP sx.         PT Long Term Goals - 01/15/16 1356      PT LONG TERM GOAL #1   Title Pt. I with HEP to increase B LE muscle strength 1/2 muscle grade (4+/5 MMT) to improve standing tolerance/ independence with functional mobility.     Baseline B LE muscle strength grossly 4/5 MMT except quad 4+/5 MMT   Time 4   Period Weeks   Status Partially Met     PT LONG TERM GOAL #2   Title Pt. will increase LEFS to >35 out of 80 to improve pain-free mobility.     Baseline LEFS: 22 out of 80 on 8/2. 8/23: 22/80   Time 4  Period Weeks   Status Partially Met     PT LONG TERM GOAL #3   Title Pt. able to progress to an independent [employer] ex. program with no pain/ issues.     Baseline pt has attempted 1x   Time 4   Period Weeks   Status Not Met     PT LONG TERM GOAL #4   Title Pt. will demonstrate proper upright seated/ standing posture with no c/o neck/L shoulder pain.     Baseline poor posture (forward head/rounded sh.).     Time 4   Period Weeks   Status Partially Met            Plan - 01/29/16 1210    Clinical Impression Statement B LE muscle fatigue and 1 episodes of R prox. hamstring muscle cramping during standing ther.ex./ stair climbing with hand wts.  Good SLS/ cone taps in //-bars  with no UE assist or LOB.  Pt. ambulates with consistent step pattern/ hip flexion with decrease heel strike noted with increase distance walked.  Pt. remains limited by fear of falling, esp. with walking at grocery store/ outside.     Rehab Potential Good   PT Frequency 2x / week   PT Duration 4 weeks   PT Treatment/Interventions ADLs/Self Care Home Management;Neuromuscular re-education;Balance training;Therapeutic exercise;Therapeutic activities;Functional mobility training;Stair training;Gait training;DME Instruction;Patient/family education;Manual techniques;Passive range of motion;Energy conservation   PT Next Visit Plan Progress LE strengthening/ higher level balance tasks.    PT Home Exercise Plan See HEP   Consulted and Agree with Plan of Care Patient      Patient will benefit from skilled therapeutic intervention in order to improve the following deficits and impairments:  Abnormal gait, Decreased endurance, Decreased activity tolerance, Decreased strength, Difficulty walking, Decreased mobility, Decreased balance, Postural dysfunction, Impaired flexibility  Visit Diagnosis: Unsteady gait  Difficulty in walking, not elsewhere classified  Muscle weakness (generalized)     Problem List Patient Active Problem List   Diagnosis Date Noted  . TIA (transient ischemic attack) 10/17/2015  . Hyponatremia 10/17/2015  . Anemia 10/17/2015  . Chest pain 10/16/2015  . Left-sided weakness 10/16/2015  . Hypotension 10/16/2015  . Bradycardia 10/16/2015  . Hypoglycemia 10/16/2015   Pura Spice, PT, DPT # [phone] 01/29/2016, 12:22 PM  [place] [facility] [address] [place], [place], [address] Phone: [phone]   Fax:  [phone]  Name: Christine Zimmerman MRN: [id number] Date of Birth: Jul 13, 1945

## 2016-02-03 ENCOUNTER — Encounter: Payer: Self-pay | Admitting: Physical Therapy

## 2016-02-03 ENCOUNTER — Ambulatory Visit: Payer: Medicare Other | Admitting: Physical Therapy

## 2016-02-03 DIAGNOSIS — M6281 Muscle weakness (generalized): Secondary | ICD-10-CM

## 2016-02-03 DIAGNOSIS — R262 Difficulty in walking, not elsewhere classified: Secondary | ICD-10-CM

## 2016-02-03 DIAGNOSIS — R2681 Unsteadiness on feet: Secondary | ICD-10-CM

## 2016-02-03 NOTE — Therapy (Signed)
[place] [facility] [address] [place], [place], [address] Phone: [phone]   Fax:  [phone]  Physical Therapy Treatment  Patient Details  Name: Christine Zimmerman MRN: [id number] Date of Birth: 18-Aug-1945 Referring Provider: Dr. Jennings Books  Encounter Date: 02/03/2016      PT End of Session - 02/03/16 1253    Visit Number 12   Number of Visits 16   Date for PT Re-Evaluation 02/12/16   Authorization - Visit Number 12   Authorization - Number of Visits 17   PT Start Time 1057   PT Stop Time 1158   PT Time Calculation (min) 61 min   Equipment Utilized During Treatment Gait belt   Activity Tolerance Patient tolerated treatment well;Patient limited by fatigue;Patient limited by pain   Behavior During Therapy [facility] for tasks assessed/performed      Past Medical History:  Diagnosis Date  . Coronary artery disease   . Diabetes mellitus without complication ([place])   . GERD (gastroesophageal reflux disease)   . High cholesterol   . Hypertension   . Hypothyroidism     Past Surgical History:  Procedure Laterality Date  . BACK SURGERY    . CARDIAC CATHETERIZATION Left 10/23/2015   Procedure: Left Heart Cath and Coronary Angiography;  Surgeon: Yolonda Kida, MD;  Location: [place] [facility];  Service: Cardiovascular;  Laterality: Left;  . CORONARY STENT PLACEMENT     x 4 at [facility]. x 1 by [facility] in 2004    There were no vitals filed for this visit.      Subjective Assessment - 02/03/16 1252    Subjective Pt states that she has been having trouble sleeping secondary to L shoulder pain and cramping in LLE. States that she feels that she is very tired when she gets a shooting pain down her leg.   Limitations Lifting;Standing;Walking;House hold activities   Patient Stated Goals Increase B LE muscle strength/ improve balance/ gait.   Currently in Pain? No/denies     Objective:  Vitals: O2 100, HR 63, BP  129/54  Neuromuscular Re-ed: Air ex pad rows RTB x30, shoulder ext YTB x30, bicep curls #3. Standing hip abd with cones x10 R/L. Standing hip flex/ext with cones x20 R/L.   Therapeutic Exercise: NuStep Level 5, 10 minutes (warm up/no charge). Standing hamstring stretch 30 sec x2. Standing quad stretch 30 sec x1 with stool; pt with poor tolerance with onset of leg pain. Standing lumbar ext x10, lumbar lateral flexion R/L x5, lumbar rotation R/L x5. Seated on green theraball static balance 1 minute, seated hip flexion x10 R/L. Pt with onset of LLE leg pain; discontinue exercise secondary to discomfort.  Pt response for medical necessity: Pt requires multiple standing rest breaks this session secondary to LE cramping/onset of LBP. She demonstrates good functional dynamic/static balance during tasks with most difficulty during single leg stance tasks with LLE as stance leg.       PT Long Term Goals - 01/15/16 1356      PT LONG TERM GOAL #1   Title Pt. I with HEP to increase B LE muscle strength 1/2 muscle grade (4+/5 MMT) to improve standing tolerance/ independence with functional mobility.     Baseline B LE muscle strength grossly 4/5 MMT except quad 4+/5 MMT   Time 4   Period Weeks   Status Partially Met     PT LONG TERM GOAL #2   Title Pt. will increase LEFS to >35  out of 80 to improve pain-free mobility.     Baseline LEFS: 22 out of 80 on 8/2. 8/23: 22/80   Time 4   Period Weeks   Status Partially Met     PT LONG TERM GOAL #3   Title Pt. able to progress to an independent [employer] ex. program with no pain/ issues.     Baseline pt has attempted 1x   Time 4   Period Weeks   Status Not Met     PT LONG TERM GOAL #4   Title Pt. will demonstrate proper upright seated/ standing posture with no c/o neck/L shoulder pain.     Baseline poor posture (forward head/rounded sh.).     Time 4   Period Weeks   Status Partially Met          Plan - 02/03/16 1419    Clinical  Impression Statement Pt limited by onset of L hip pain and L shoulder pain during session. She demonstrates good balance on unstable surface with dynamic UE use with no LOB; more difficulty with LLE used as stance leg during LE dynamic balance exercises.   Rehab Potential Good   PT Frequency 2x / week   PT Duration 4 weeks   PT Treatment/Interventions ADLs/Self Care Home Management;Neuromuscular re-education;Balance training;Therapeutic exercise;Therapeutic activities;Functional mobility training;Stair training;Gait training;DME Instruction;Patient/family education;Manual techniques;Passive range of motion;Energy conservation   PT Next Visit Plan Progress LE strengthening/ higher level balance tasks.    PT Home Exercise Plan See HEP   Consulted and Agree with Plan of Care Patient      Patient will benefit from skilled therapeutic intervention in order to improve the following deficits and impairments:  Abnormal gait, Decreased endurance, Decreased activity tolerance, Decreased strength, Difficulty walking, Decreased mobility, Decreased balance, Postural dysfunction, Impaired flexibility  Visit Diagnosis: Unsteady gait  Difficulty in walking, not elsewhere classified  Muscle weakness (generalized)     Problem List Patient Active Problem List   Diagnosis Date Noted  . TIA (transient ischemic attack) 10/17/2015  . Hyponatremia 10/17/2015  . Anemia 10/17/2015  . Chest pain 10/16/2015  . Left-sided weakness 10/16/2015  . Hypotension 10/16/2015  . Bradycardia 10/16/2015  . Hypoglycemia 10/16/2015   Pura Spice, PT, DPT # [id number] Derrill Memo, SPT 02/04/2016, 11:49 AM  [facility] [address] [place], [place], [address] Phone: [phone]   Fax:  [phone]  Name: Christine Zimmerman MRN: [id number] Date of Birth: [birth year]

## 2016-02-05 ENCOUNTER — Ambulatory Visit: Payer: Medicare Other | Admitting: Physical Therapy

## 2016-02-05 DIAGNOSIS — R262 Difficulty in walking, not elsewhere classified: Secondary | ICD-10-CM

## 2016-02-05 DIAGNOSIS — M6281 Muscle weakness (generalized): Secondary | ICD-10-CM

## 2016-02-05 DIAGNOSIS — R2681 Unsteadiness on feet: Secondary | ICD-10-CM | POA: Diagnosis not present

## 2016-02-06 NOTE — Therapy (Addendum)
[place] [facility] [address] [place], [place], [address] Phone: [phone]   Fax:  [phone]  Physical Therapy Treatment  Patient Details  Name: Christine Zimmerman MRN: [id number] Date of Birth: 08/11/1945 Referring Provider: Dr. Jennings Books  Encounter Date: 02/05/2016      PT End of Session - 02/06/16 1544    Visit Number 13   Number of Visits 16   Date for PT Re-Evaluation 02/12/16   Authorization - Visit Number 13   Authorization - Number of Visits 17   PT Start Time 7121   PT Stop Time 1144   PT Time Calculation (min) 53 min   Equipment Utilized During Treatment Gait belt   Activity Tolerance Patient tolerated treatment well;Patient limited by fatigue;Patient limited by pain   Behavior During Therapy [facility] for tasks assessed/performed      Past Medical History:  Diagnosis Date  . Coronary artery disease   . Diabetes mellitus without complication ([place])   . GERD (gastroesophageal reflux disease)   . High cholesterol   . Hypertension   . Hypothyroidism     Past Surgical History:  Procedure Laterality Date  . BACK SURGERY    . CARDIAC CATHETERIZATION Left 10/23/2015   Procedure: Left Heart Cath and Coronary Angiography;  Surgeon: Yolonda Kida, MD;  Location: [place] [facility];  Service: Cardiovascular;  Laterality: Left;  . CORONARY STENT PLACEMENT     x 4 at [facility]. x 1 by [facility] in 2004    There were no vitals filed for this visit.      Subjective Assessment - 02/06/16 1544    Limitations Lifting;Standing;Walking;House hold activities   Patient Stated Goals Increase B LE muscle strength/ improve balance/ gait.   Currently in Pain? No/denies        Pt. c/o increase muscle cramping last night but doing better today.  Pt. continues to feel tired but no reports of falls or LOB over past week.  Pt. states she is going to [place] next Tuesday for the week to see family.        Objective:  Vitals: O2  sat 100%, HR 65, BP 129/56  Neuromuscular Re-ed: Tandem/ SLS tasks in //-bars.  Functional reaching reassessment.  Air ex pad rows RTB x30, shoulder ext YTB x30, bicep curls #3. Standing hip abd with cones x10 R/L. Standing hip flex/ext with cones x20 R/L.   Therapeutic Exercise: NuStep Level 5, 10 minutes (warm up/no charge). Standing hamstring stretch 30 sec x2.  Standing lumbar ext x10, lumbar lateral flexion R/L x5, lumbar rotation R/L x5. Step ups/ overs in //-bars with cuing for proper posture.  Reviewed HEP  Pt response for medical necessity:  She demonstrates good functional dynamic/static balance during tasks with most difficulty during single leg stance tasks with LLE as stance leg.  LE muscle fatigue remains an issue with no c/o cramping today.      Minimal c/o L shoulder pain during use of Nustep and pt. instructed to limit use of L UE and focus on B LE.  Pt. ambulating and completing balance tasks with min. use of UE and no LOB.  Pt. has greater difficulty with L LE during balance tasks.          PT Long Term Goals - 01/15/16 1356      PT LONG TERM GOAL #1   Title Pt. I with HEP to increase B LE muscle strength 1/2 muscle grade (4+/5 MMT) to  improve standing tolerance/ independence with functional mobility.     Baseline B LE muscle strength grossly 4/5 MMT except quad 4+/5 MMT   Time 4   Period Weeks   Status Partially Met     PT LONG TERM GOAL #2   Title Pt. will increase LEFS to >35 out of 80 to improve pain-free mobility.     Baseline LEFS: 22 out of 80 on 8/2. 8/23: 22/80   Time 4   Period Weeks   Status Partially Met     PT LONG TERM GOAL #3   Title Pt. able to progress to an independent [employer] ex. program with no pain/ issues.     Baseline pt has attempted 1x   Time 4   Period Weeks   Status Not Met     PT LONG TERM GOAL #4   Title Pt. will demonstrate proper upright seated/ standing posture with no c/o neck/L shoulder pain.     Baseline  poor posture (forward head/rounded sh.).     Time 4   Period Weeks   Status Partially Met               Plan - 02/06/16 1545    Rehab Potential Good   PT Frequency 2x / week   PT Duration 4 weeks   PT Treatment/Interventions ADLs/Self Care Home Management;Neuromuscular re-education;Balance training;Therapeutic exercise;Therapeutic activities;Functional mobility training;Stair training;Gait training;DME Instruction;Patient/family education;Manual techniques;Passive range of motion;Energy conservation   PT Next Visit Plan Progress LE strengthening/ higher level balance tasks.    PT Home Exercise Plan See HEP   Consulted and Agree with Plan of Care Patient      Patient will benefit from skilled therapeutic intervention in order to improve the following deficits and impairments:  Abnormal gait, Decreased endurance, Decreased activity tolerance, Decreased strength, Difficulty walking, Decreased mobility, Decreased balance, Postural dysfunction, Impaired flexibility  Visit Diagnosis: Unsteady gait  Difficulty in walking, not elsewhere classified  Muscle weakness (generalized)     Problem List Patient Active Problem List   Diagnosis Date Noted  . TIA (transient ischemic attack) 10/17/2015  . Hyponatremia 10/17/2015  . Anemia 10/17/2015  . Chest pain 10/16/2015  . Left-sided weakness 10/16/2015  . Hypotension 10/16/2015  . Bradycardia 10/16/2015  . Hypoglycemia 10/16/2015   Pura Spice, PT, DPT # [id number] 02/06/2016, 3:46 PM  [place] [facility] [address] [place], [place], [address] Phone: [phone]   Fax:  [phone]  Name: Christine Zimmerman MRN: [id number] Date of Birth: 13-Jul-1945

## 2016-02-09 ENCOUNTER — Encounter: Payer: Self-pay | Admitting: Physical Therapy

## 2016-02-09 ENCOUNTER — Ambulatory Visit: Payer: Medicare Other | Admitting: Physical Therapy

## 2016-02-09 DIAGNOSIS — R262 Difficulty in walking, not elsewhere classified: Secondary | ICD-10-CM

## 2016-02-09 DIAGNOSIS — M6281 Muscle weakness (generalized): Secondary | ICD-10-CM

## 2016-02-09 DIAGNOSIS — R2681 Unsteadiness on feet: Secondary | ICD-10-CM | POA: Diagnosis not present

## 2016-02-09 NOTE — Therapy (Addendum)
[facility] [address] [place], [place], [address] Phone: [phone]   Fax:  [phone]  Physical Therapy Treatment/Discharge  Patient Details  Name: Christine Zimmerman MRN: [id number] Date of Birth: 1946-04-22 Referring Provider: Dr. Jennings Books  Encounter Date: 02/09/2016      PT End of Session - 02/09/16 1224    Visit Number 14   Number of Visits 16   Date for PT Re-Evaluation 02/12/16   Authorization - Visit Number 14   Authorization - Number of Visits 17   PT Start Time 1034   PT Stop Time 1129   PT Time Calculation (min) 55 min   Activity Tolerance Patient tolerated treatment well;Patient limited by fatigue;Patient limited by pain   Behavior During Therapy [facility] for tasks assessed/performed      Past Medical History:  Diagnosis Date  . Coronary artery disease   . Diabetes mellitus without complication ([place])   . GERD (gastroesophageal reflux disease)   . High cholesterol   . Hypertension   . Hypothyroidism     Past Surgical History:  Procedure Laterality Date  . BACK SURGERY    . CARDIAC CATHETERIZATION Left 10/23/2015   Procedure: Left Heart Cath and Coronary Angiography;  Surgeon: Yolonda Kida, MD;  Location: [place] [facility];  Service: Cardiovascular;  Laterality: Left;  . CORONARY STENT PLACEMENT     x 4 at [facility]. x 1 by [facility] in 2004    There were no vitals filed for this visit.      Subjective Assessment - 02/09/16 1221    Subjective Pt states that she is feeling tired today; c/o of left sided shoulder/neck pain. States that her blood sugar was in the 90s this morning.   Limitations Lifting;Standing;Walking;House hold activities   Patient Stated Goals Increase B LE muscle strength/ improve balance/ gait.   Currently in Pain? No/denies     Objective:  Therapeutic Exercise: Nu Step 10 minutes Level 5 (warm up/no charge). Heel raises x15. SLR x10 R/L. Bridge x2 (unable to tolerate  secondary to onset of L shoulder/neck pain). Clamshell 2x10 R/L. Standing hip abd isometric 10 sec x10 R/L. Standing him ext isometric 10 sec x10 R/L. Prone lying 2 minutes. Squat with tactile cueing using chair + air ex pad for additional height x15. Wall slide x5 with 5 sec holds.   Pt response for medical necessity: Pt with onset of L sided shoulder/neck pain with bridge positioning but otherwise able to tolerate all issued therapeutic exercise. She demonstrates difficulty performing wall slide and clamshell secondary to muscle fatigue but shows good understanding of exercises to be performed independently.      PT Education - 02/09/16 1224    Education provided Yes   Education Details HEP   Person(s) Educated Patient   Methods Explanation;Demonstration;Handout   Comprehension Verbalized understanding;Returned demonstration             PT Long Term Goals - 02/09/16 1228      PT LONG TERM GOAL #1   Title Pt. I with HEP to increase B LE muscle strength 1/2 muscle grade (4+/5 MMT) to improve standing tolerance/ independence with functional mobility.     Baseline B LE muscle strength grossly 4/5 MMT except quad 4+/5 MMT   Time 4   Period Weeks   Status Partially Met     PT LONG TERM GOAL #2   Title Pt. will increase LEFS to >35 out of 80 to improve pain-free  mobility.     Baseline LEFS: 22 out of 80 on 8/2. 8/23: 22/80 9/25: 34/80   Time 4   Period Weeks   Status Partially Met     PT LONG TERM GOAL #3   Title Pt. able to progress to an independent [employer] ex. program with no pain/ issues.     Baseline pt has attempted 1x   Time 4   Period Weeks   Status Partially Met     PT LONG TERM GOAL #4   Title Pt. will demonstrate proper upright seated/ standing posture with no c/o neck/L shoulder pain.     Baseline poor posture (forward head/rounded sh.).     Time 4   Period Weeks   Status Partially Met               Plan - 02/09/16 1225    Clinical  Impression Statement Pt demonstrates good functional stability throughout session with good understanding of issued HEP program. Pt has demonstrated steady progress throughout physical therapy treatment with mild improvements but is able to continue LE strengthening/stability program independently at this time.   Rehab Potential Good   PT Frequency 2x / week   PT Duration 4 weeks   PT Treatment/Interventions ADLs/Self Care Home Management;Neuromuscular re-education;Balance training;Therapeutic exercise;Therapeutic activities;Functional mobility training;Stair training;Gait training;DME Instruction;Patient/family education;Manual techniques;Passive range of motion;Energy conservation   PT Home Exercise Plan See HEP   Consulted and Agree with Plan of Care Patient      Patient will benefit from skilled therapeutic intervention in order to improve the following deficits and impairments:  Abnormal gait, Decreased endurance, Decreased activity tolerance, Decreased strength, Difficulty walking, Decreased mobility, Decreased balance, Postural dysfunction, Impaired flexibility  Visit Diagnosis: Unsteady gait  Difficulty in walking, not elsewhere classified  Muscle weakness (generalized)     Problem List Patient Active Problem List   Diagnosis Date Noted  . TIA (transient ischemic attack) 10/17/2015  . Hyponatremia 10/17/2015  . Anemia 10/17/2015  . Chest pain 10/16/2015  . Left-sided weakness 10/16/2015  . Hypotension 10/16/2015  . Bradycardia 10/16/2015  . Hypoglycemia 10/16/2015   Pura Spice, PT, DPT # [id number] Mickel Baas Latina Frank SPT 02/09/2016, 12:33 PM  [facility] [address] [place], [place], [address] Phone: [phone]   Fax:  [phone]  Name: Christine Zimmerman MRN: [id number] Date of Birth: December 10, 1945

## 2016-07-13 NOTE — Therapy (Signed)
Note opened in error.  Patient cancelled.    Cammie McgeeMichael C Canda Podgorski, PT, DPT # [phone]

## 2016-11-10 ENCOUNTER — Other Ambulatory Visit: Payer: Self-pay | Admitting: Physician Assistant

## 2016-11-10 DIAGNOSIS — Z1239 Encounter for other screening for malignant neoplasm of breast: Secondary | ICD-10-CM

## 2016-11-10 DIAGNOSIS — R921 Mammographic calcification found on diagnostic imaging of breast: Secondary | ICD-10-CM

## 2016-12-01 ENCOUNTER — Emergency Department: Payer: Medicare Other

## 2016-12-01 ENCOUNTER — Emergency Department
Admission: EM | Admit: 2016-12-01 | Discharge: 2016-12-01 | Disposition: A | Discharge status: Home / Self Care | Payer: Medicare Other | Attending: Emergency Medicine | Admitting: Emergency Medicine

## 2016-12-01 ENCOUNTER — Encounter: Payer: Self-pay | Admitting: Emergency Medicine

## 2016-12-01 DIAGNOSIS — E039 Hypothyroidism, unspecified: Secondary | ICD-10-CM | POA: Diagnosis not present

## 2016-12-01 DIAGNOSIS — Z7984 Long term (current) use of oral hypoglycemic drugs: Secondary | ICD-10-CM | POA: Insufficient documentation

## 2016-12-01 DIAGNOSIS — I10 Essential (primary) hypertension: Secondary | ICD-10-CM | POA: Diagnosis not present

## 2016-12-01 DIAGNOSIS — Z79899 Other long term (current) drug therapy: Secondary | ICD-10-CM | POA: Diagnosis not present

## 2016-12-01 DIAGNOSIS — E119 Type 2 diabetes mellitus without complications: Secondary | ICD-10-CM | POA: Insufficient documentation

## 2016-12-01 DIAGNOSIS — Z7982 Long term (current) use of aspirin: Secondary | ICD-10-CM | POA: Insufficient documentation

## 2016-12-01 DIAGNOSIS — M25511 Pain in right shoulder: Secondary | ICD-10-CM | POA: Diagnosis present

## 2016-12-01 DIAGNOSIS — R079 Chest pain, unspecified: Secondary | ICD-10-CM

## 2016-12-01 DIAGNOSIS — Z7902 Long term (current) use of antithrombotics/antiplatelets: Secondary | ICD-10-CM | POA: Diagnosis not present

## 2016-12-01 LAB — CBC
HEMATOCRIT: 37.5 % (ref 35.0–47.0)
HEMOGLOBIN: 12.8 g/dL (ref 12.0–16.0)
MCH: 30.5 pg (ref 26.0–34.0)
MCHC: 34.2 g/dL (ref 32.0–36.0)
MCV: 89.3 fL (ref 80.0–100.0)
Platelets: 368 10*3/uL (ref 150–440)
RBC: 4.2 MIL/uL (ref 3.80–5.20)
RDW: 13.9 % (ref 11.5–14.5)
WBC: 11.4 10*3/uL — ABNORMAL HIGH (ref 3.6–11.0)

## 2016-12-01 LAB — BASIC METABOLIC PANEL
ANION GAP: 8 (ref 5–15)
BUN: 14 mg/dL (ref 6–20)
CALCIUM: 9.1 mg/dL (ref 8.9–10.3)
CO2: 26 mmol/L (ref 22–32)
Chloride: 102 mmol/L (ref 101–111)
Creatinine, Ser: 0.78 mg/dL (ref 0.44–1.00)
GFR calc Af Amer: >60 mL/min (ref >60)
GFR calc non Af Amer: >60 mL/min (ref >60)
Glucose, Bld: 262 mg/dL — ABNORMAL HIGH (ref 65–99)
POTASSIUM: 4.1 mmol/L (ref 3.5–5.1)
Sodium: 136 mmol/L (ref 135–145)

## 2016-12-01 LAB — TROPONIN I: Troponin I: <0.03 ng/mL (ref <0.03)

## 2016-12-01 MED ORDER — HYDROCODONE-ACETAMINOPHEN 5-325 MG PO TABS: 1.0000 | ORAL_TABLET | ORAL | 0 refills | Status: DC | PRN

## 2016-12-01 MED ORDER — IOPAMIDOL (ISOVUE-370) INJECTION 76%
75.0000 mL | Freq: Once | INTRAVENOUS | Status: AC | PRN
  Administered 2016-12-01: 75 mL via INTRAVENOUS

## 2016-12-01 MED ORDER — HYDROCODONE-ACETAMINOPHEN 5-325 MG PO TABS
1.0000 | ORAL_TABLET | Freq: Once | ORAL | Status: AC
  Administered 2016-12-01: 1 via ORAL
  Filled 2016-12-01: qty 1

## 2016-12-01 NOTE — Discharge Instructions (Signed)
You have been seen in the emergency department today for chest pain. Your workup has shown normal results. As we discussed please follow-up with your primary care physician in the next 1-2 days for recheck. Return to the emergency department for any further chest pain, trouble breathing, or any other symptom personally concerning to yourself. °

## 2016-12-01 NOTE — ED Notes (Signed)
Pt calling husband for ride home for discharge.

## 2016-12-01 NOTE — ED Triage Notes (Addendum)
Pt c/o pain in right shoulder radiating into back right shoulder blade. Described as sharp.  Pain is intermittent and comes even when not moving per pt.  Denies SHOB.  Respirations unlabored. Skin warm and dry.pain shoots to neck with deep breath

## 2016-12-01 NOTE — ED Provider Notes (Signed)
[facility] Emergency Department Provider Note  Time seen: 6:37 PM  I have reviewed the triage vital signs and the nursing notes.   HISTORY  Chief Complaint Other (right shoulder/scapula pain)    HPI CORNELL GABER is a 71 y.o. female with a past medical history of gastric reflux, diabetes, hypertension, hyperlipidemia presents to the emergency department for right back pain. According to the patient since this morning she has been experiencing right back pain occasionally radiating into her chest. States the pain is worse with movement or with deep inspiration. Denies any prior blood clots. Denies any shortness of breath nausea or diaphoresis. States it feels muscular and hurts if she pushes on the area with her hand or if she turns her head or bends. Describes the pain as moderate although improved since taking ibuprofen at home. Describes as sharp in nature.  Past Medical History:  Diagnosis Date  . Coronary artery disease   . Diabetes mellitus without complication ([facility])   . GERD (gastroesophageal reflux disease)   . High cholesterol   . Hypertension   . Hypothyroidism     Patient Active Problem List   Diagnosis Date Noted  . TIA (transient ischemic attack) 10/17/2015  . Hyponatremia 10/17/2015  . Anemia 10/17/2015  . Chest pain 10/16/2015  . Left-sided weakness 10/16/2015  . Hypotension 10/16/2015  . Bradycardia 10/16/2015  . Hypoglycemia 10/16/2015    Past Surgical History:  Procedure Laterality Date  . BACK SURGERY    . CARDIAC CATHETERIZATION Left 10/23/2015   Procedure: Left Heart Cath and Coronary Angiography;  Surgeon: Alwyn Pea, MD;  Location: [facility] INVASIVE CV [facility];  Service: Cardiovascular;  Laterality: Left;  . CORONARY STENT PLACEMENT     x 4 at [facility]. x 1 by [facility] in 2004    Prior to Admission medications   Medication Sig Start Date End Date Taking? Authorizing Provider  amLODipine (NORVASC) 2.5 MG tablet Take 2.5 mg  by mouth daily.    [provider]  aspirin EC 81 MG tablet Take 81 mg by mouth daily.    [provider]  atorvastatin (LIPITOR) 80 MG tablet Take 80 mg by mouth daily.    [provider]  calcium-vitamin D (OSCAL 500/200 D-3) 500-200 MG-UNIT tablet Take 1 tablet by mouth daily.    [provider]  clopidogrel (PLAVIX) 75 MG tablet Take 75 mg by mouth daily.    [provider]  cyanocobalamin (,VITAMIN B-12,) 1000 MCG/ML injection Inject 1,000 mcg into the muscle every 30 (thirty) days.    [provider]  ferrous sulfate 325 (65 FE) MG tablet Take 325 mg by mouth daily with breakfast.    [provider]  gabapentin (NEURONTIN) 300 MG capsule Take 300 mg by mouth at bedtime.    [provider]  glipiZIDE (GLUCOTROL) 10 MG tablet Take 0.5 tablets (5 mg total) by mouth 2 (two) times daily before a meal. 10/17/15   Katharina Caper, MD  isosorbide mononitrate (IMDUR) 30 MG 24 hr tablet Take 30 mg by mouth daily.    [provider]  levothyroxine (SYNTHROID, LEVOTHROID) 75 MCG tablet Take 75 mcg by mouth daily before breakfast.    [provider]  lisinopril (PRINIVIL,ZESTRIL) 20 MG tablet Take 20 mg by mouth daily.    [provider]  metFORMIN (GLUCOPHAGE) 1000 MG tablet Take 1,000 mg by mouth 2 (two) times daily.    [provider]  niacin 500 MG tablet Take 500  mg by mouth daily.    [provider]  nitroGLYCERIN (NITROLINGUAL) 0.4 MG/SPRAY spray Place 1 spray under the tongue every 5 (five) minutes x 3 doses as needed for chest pain.    [provider]  ranitidine (ZANTAC) 150 MG tablet Take 150 mg by mouth 2 (two) times daily. Reported on 10/23/2015    [provider]    Allergies  Allergen Reactions  . Biaxin [Clarithromycin] Hives  . Homatropine     hycodan  . Januvia [Sitagliptin]   . Macrodantin [Nitrofurantoin Macrocrystal]   . Naproxen   . Penicillins    . Propoxyphene     Darvocet  . Tramadol     Family History  Problem Relation Age of Onset  . Breast cancer Neg Hx     Social History Social History  Substance Use Topics  . Smoking status: Never Smoker  . Smokeless tobacco: Not on file  . Alcohol use No    Review of Systems Constitutional: Negative for fever. Cardiovascular: Positive for right posterior chest pain. Intermittent, sharp. Respiratory: Negative for shortness of breath positive for pleuritic chest pain. Gastrointestinal: Negative for abdominal pain, vomiting and diarrhea. Musculoskeletal: Right upper back pain negative for leg pain or swelling. Neurological: Negative for headache All other ROS negative  ____________________________________________   PHYSICAL EXAM:  VITAL SIGNS: ED Triage Vitals  Enc Vitals Group     BP 12/01/16 1636 (!) 127/54     Pulse Rate 12/01/16 1636 60     Resp 12/01/16 1636 18     Temp 12/01/16 1636 98.4 F (36.9 C)     Temp Source 12/01/16 1636 Oral     SpO2 12/01/16 1636 98 %     Weight 12/01/16 1632 154 lb (69.9 kg)     Height 12/01/16 1632 5' (1.524 m)     Head Circumference --      Peak Flow --      Pain Score 12/01/16 1631 10     Pain Loc --      Pain Edu? --      Excl. in GC? --     Constitutional: Alert and oriented. Well appearing and in no distress. Eyes: Normal exam ENT   Head: Normocephalic and atraumatic   Mouth/Throat: Mucous membranes are moist. Cardiovascular: Normal rate, regular rhythm. No murmur Respiratory: Normal respiratory effort without tachypnea nor retractions. Breath sounds are clear. Patient does have some discomfort with deep inspiration. Patient has tenderness to the right trapezius as well as right scapular area. Gastrointestinal: Soft and nontender. No distention.   Musculoskeletal: Nontender with normal range of motion in all extremities. No lower extremity tenderness  Neurologic:  Normal speech and language. No gross focal  neurologic deficits Skin:  Skin is warm, dry and intact.  Psychiatric: Mood and affect are normal.   ____________________________________________    EKG  EKG reviewed and interpreted by myself shows normal sinus rhythm at 64 bpm, slightly widened QRS, normal axis, normal intervals, nonspecific ST changes without ST elevation.  ____________________________________________    RADIOLOGY  Chest x-ray negative CT negative for PE. ____________________________________________   INITIAL IMPRESSION / ASSESSMENT AND PLAN / ED COURSE  Pertinent labs & imaging results that were available during my care of the patient were reviewed by me and considered in my medical decision making (see chart for details).  Patient presents to the emergency department for right posterior chest pain worse with inspiration. The chest pain/back pain is very reproducible on exam highly suspect muscle skeletal  pain however given the pleuritic nature with occasional radiation into the anterior chest we'll obtain a CT angiography to rule out pulmonary embolism. Patient agreeable to this plan. We will treat with one Norco in the emergency department. Patient's labs including cardiac enzymes are negative.  CT scan is negative. Patient was complaining of sharp left-sided chest pain. Repeat EKG is unchanged.  Repeat EKG reviewed and interpreted by myself shows normal sinus rhythm at 70 bpm with a slightly widened QRS, normal axis, normal intervals with nonspecific ST changes without ST elevation.  ____________________________________________   FINAL CLINICAL IMPRESSION(S) / ED DIAGNOSES  Chest pain    Minna Antis, MD 12/01/16 1954

## 2016-12-01 NOTE — ED Notes (Signed)
Pt reports sudden onset left shoulder pain with radiation down left arm. Pt described pain as "tightness" and was moaning. Pt moved to rm per Charge RN and EKG completed. EDP made aware. Pt A&O at this time, pt states "a little" SHOB. Pt able to answer questions in complete sentences.

## 2016-12-01 NOTE — ED Notes (Signed)
ED Provider at bedside. 

## 2016-12-01 NOTE — ED Notes (Signed)

## 2016-12-17 ENCOUNTER — Ambulatory Visit
Admission: RE | Admit: 2016-12-17 | Discharge: 2016-12-17 | Disposition: A | Discharge status: Home / Self Care | Payer: Medicare Other | Source: Ambulatory Visit | Attending: Physician Assistant | Admitting: Physician Assistant

## 2016-12-17 DIAGNOSIS — Z1239 Encounter for other screening for malignant neoplasm of breast: Secondary | ICD-10-CM

## 2016-12-17 DIAGNOSIS — R921 Mammographic calcification found on diagnostic imaging of breast: Secondary | ICD-10-CM | POA: Insufficient documentation

## 2016-12-31 ENCOUNTER — Encounter: Payer: Self-pay | Admitting: *Deleted

## 2017-01-03 ENCOUNTER — Ambulatory Visit
Admission: RE | Admit: 2017-01-03 | Discharge: 2017-01-03 | Disposition: A | Discharge status: Home / Self Care | Payer: Medicare Other | Source: Ambulatory Visit | Attending: Unknown Physician Specialty | Admitting: Unknown Physician Specialty

## 2017-01-03 ENCOUNTER — Encounter
Admission: RE | Disposition: A | Discharge status: Home / Self Care | Payer: Self-pay | Source: Ambulatory Visit | Attending: Unknown Physician Specialty

## 2017-01-03 ENCOUNTER — Ambulatory Visit: Payer: Medicare Other | Admitting: Anesthesiology

## 2017-01-03 DIAGNOSIS — E119 Type 2 diabetes mellitus without complications: Secondary | ICD-10-CM | POA: Insufficient documentation

## 2017-01-03 DIAGNOSIS — E7800 Pure hypercholesterolemia, unspecified: Secondary | ICD-10-CM | POA: Insufficient documentation

## 2017-01-03 DIAGNOSIS — Z7982 Long term (current) use of aspirin: Secondary | ICD-10-CM | POA: Insufficient documentation

## 2017-01-03 DIAGNOSIS — Z955 Presence of coronary angioplasty implant and graft: Secondary | ICD-10-CM | POA: Diagnosis not present

## 2017-01-03 DIAGNOSIS — G8929 Other chronic pain: Secondary | ICD-10-CM | POA: Diagnosis not present

## 2017-01-03 DIAGNOSIS — Z1211 Encounter for screening for malignant neoplasm of colon: Secondary | ICD-10-CM | POA: Diagnosis not present

## 2017-01-03 DIAGNOSIS — Z881 Allergy status to other antibiotic agents status: Secondary | ICD-10-CM | POA: Insufficient documentation

## 2017-01-03 DIAGNOSIS — Z888 Allergy status to other drugs, medicaments and biological substances status: Secondary | ICD-10-CM | POA: Insufficient documentation

## 2017-01-03 DIAGNOSIS — K219 Gastro-esophageal reflux disease without esophagitis: Secondary | ICD-10-CM | POA: Insufficient documentation

## 2017-01-03 DIAGNOSIS — I2510 Atherosclerotic heart disease of native coronary artery without angina pectoris: Secondary | ICD-10-CM | POA: Insufficient documentation

## 2017-01-03 DIAGNOSIS — K640 First degree hemorrhoids: Secondary | ICD-10-CM | POA: Diagnosis not present

## 2017-01-03 DIAGNOSIS — Z7984 Long term (current) use of oral hypoglycemic drugs: Secondary | ICD-10-CM | POA: Diagnosis not present

## 2017-01-03 DIAGNOSIS — E039 Hypothyroidism, unspecified: Secondary | ICD-10-CM | POA: Diagnosis not present

## 2017-01-03 DIAGNOSIS — Z880 Allergy status to penicillin: Secondary | ICD-10-CM | POA: Insufficient documentation

## 2017-01-03 DIAGNOSIS — I10 Essential (primary) hypertension: Secondary | ICD-10-CM | POA: Diagnosis not present

## 2017-01-03 DIAGNOSIS — Z886 Allergy status to analgesic agent status: Secondary | ICD-10-CM | POA: Insufficient documentation

## 2017-01-03 DIAGNOSIS — Z86010 Personal history of colonic polyps: Secondary | ICD-10-CM | POA: Insufficient documentation

## 2017-01-03 DIAGNOSIS — Z7902 Long term (current) use of antithrombotics/antiplatelets: Secondary | ICD-10-CM | POA: Diagnosis not present

## 2017-01-03 DIAGNOSIS — Z79899 Other long term (current) drug therapy: Secondary | ICD-10-CM | POA: Diagnosis not present

## 2017-01-03 HISTORY — PX: COLONOSCOPY WITH PROPOFOL: SHX5780

## 2017-01-03 HISTORY — DX: Other chronic pain: G89.29

## 2017-01-03 LAB — GLUCOSE, CAPILLARY
GLUCOSE-CAPILLARY: 209 mg/dL — AB (ref 65–99)
Glucose-Capillary: 204 mg/dL — ABNORMAL HIGH (ref 65–99)

## 2017-01-03 SURGERY — COLONOSCOPY WITH PROPOFOL
Anesthesia: General

## 2017-01-03 MED ORDER — GLYCOPYRROLATE 0.2 MG/ML IJ SOLN
INTRAMUSCULAR | Status: AC
  Filled 2017-01-03: qty 1

## 2017-01-03 MED ORDER — PROPOFOL 10 MG/ML IV BOLUS
INTRAVENOUS | Status: DC | PRN
  Administered 2017-01-03: 80 mg via INTRAVENOUS

## 2017-01-03 MED ORDER — SODIUM CHLORIDE 0.9 % IV SOLN: INTRAVENOUS | Status: DC

## 2017-01-03 MED ORDER — PROPOFOL 500 MG/50ML IV EMUL
INTRAVENOUS | Status: DC | PRN
  Administered 2017-01-03: 120 ug/kg/min via INTRAVENOUS

## 2017-01-03 MED ORDER — SODIUM CHLORIDE 0.9 % IV SOLN
INTRAVENOUS | Status: DC
  Administered 2017-01-03: 11:00:00 via INTRAVENOUS

## 2017-01-03 MED ORDER — GLYCOPYRROLATE 0.2 MG/ML IJ SOLN
INTRAMUSCULAR | Status: DC | PRN
  Administered 2017-01-03: 0.2 mg via INTRAVENOUS

## 2017-01-03 NOTE — Op Note (Signed)
[facility]astroenterology Patient Name: Christine Zimmerman Procedure Date: 01/03/2017 11:32 AM MRN: [id number] Account #: [id number] Date of Birth: [birth year] Admit Type: Outpatient Age: 71 Room: [facility] ENDO ROOM 3 Gender: Female Note Status: Finalized Procedure:            Colonoscopy Indications:          High risk colon cancer surveillance: Personal history                        of colonic polyps Providers:            Scot Jun, MD Referring MD:         Marilynne Halsted, MD (Referring MD) Medicines:            Propofol per Anesthesia Complications:        No immediate complications. Procedure:            Pre-Anesthesia Assessment:                       - After reviewing the risks and benefits, the patient                        was deemed in satisfactory condition to undergo the                        procedure.                       After obtaining informed consent, the colonoscope was                        passed under direct vision. Throughout the procedure,                        the patient's blood pressure, pulse, and oxygen                        saturations were monitored continuously. The                        Colonoscope was introduced through the anus and                        advanced to the the cecum, identified by appendiceal                        orifice and ileocecal valve. The colonoscopy was                        performed without difficulty. The patient tolerated the                        procedure well. The quality of the bowel preparation                        was good. Findings:      Internal hemorrhoids were found during endoscopy. The hemorrhoids were       small and Grade I (internal hemorrhoids that do not prolapse).      The exam was otherwise without abnormality. Impression:           -  Internal hemorrhoids.                       - The examination was otherwise normal.                       - No specimens  collected. Recommendation:       - Repeat colonoscopy in 5 years for surveillance. Scot Jun, MD 01/03/2017 12:15:37 PM This report has been signed electronically. Number of Addenda: 0 Note Initiated On: 01/03/2017 11:32 AM Scope Withdrawal Time: 0 hours 12 minutes 29 seconds  Total Procedure Duration: 0 hours 18 minutes 5 seconds       [facility]

## 2017-01-03 NOTE — H&P (Signed)
Primary Care Physician:  Patrice Paradise, MD Primary Gastroenterologist:  Dr. Mechele Collin  Pre-Procedure History & Physical: HPI:  Christine Zimmerman is a 71 y.o. female is here for an colonoscopy.   Past Medical History:  Diagnosis Date  . Chronic pain   . Coronary artery disease   . Diabetes mellitus without complication ([facility])   . GERD (gastroesophageal reflux disease)   . High cholesterol   . Hypertension   . Hypothyroidism     Past Surgical History:  Procedure Laterality Date  . BACK SURGERY    . CARDIAC CATHETERIZATION Left 10/23/2015   Procedure: Left Heart Cath and Coronary Angiography;  Surgeon: Alwyn Pea, MD;  Location: [facility] INVASIVE CV LAB;  Service: Cardiovascular;  Laterality: Left;  . CORONARY STENT PLACEMENT     x 4 at [facility]. x 1 by [facility] in 2004    Prior to Admission medications   Medication Sig Start Date End Date Taking? Authorizing Provider  ferrous sulfate 325 (65 FE) MG tablet Take 325 mg by mouth daily with breakfast.   Yes [provider]  gabapentin (NEURONTIN) 300 MG capsule Take 300 mg by mouth 2 (two) times daily.    Yes [provider]  glipiZIDE (GLUCOTROL) 10 MG tablet Take 0.5 tablets (5 mg total) by mouth 2 (two) times daily before a meal. 10/17/15  Yes Katharina Caper, MD  isosorbide mononitrate (IMDUR) 30 MG 24 hr tablet Take 30 mg by mouth daily.   Yes [provider]  levothyroxine (SYNTHROID, LEVOTHROID) 75 MCG tablet Take 75 mcg by mouth daily before breakfast.   Yes [provider]  lisinopril (PRINIVIL,ZESTRIL) 20 MG tablet Take 20 mg by mouth daily.   Yes [provider]  metFORMIN (GLUCOPHAGE) 1000 MG tablet Take 1,000 mg by mouth 2 (two) times daily.   Yes [provider]  amLODipine (NORVASC) 2.5 MG tablet Take 2.5 mg by mouth daily.    [provider]  aspirin EC 81 MG tablet Take 81 mg by mouth daily.    [provider]  atorvastatin (LIPITOR) 80 MG  tablet Take 80 mg by mouth daily.    [provider]  calcium-vitamin D (OSCAL 500/200 D-3) 500-200 MG-UNIT tablet Take 1 tablet by mouth daily.    [provider]  clopidogrel (PLAVIX) 75 MG tablet Take 75 mg by mouth daily.    [provider]  cyanocobalamin (,VITAMIN B-12,) 1000 MCG/ML injection Inject 1,000 mcg into the muscle every 30 (thirty) days.    [provider]  HYDROcodone-acetaminophen (NORCO/VICODIN) 5-325 MG tablet Take 1 tablet by mouth every 4 (four) hours as needed. Patient not taking: Reported on 01/03/2017 12/01/16   Minna Antis, MD  niacin 500 MG tablet Take 500 mg by mouth daily.    [provider]  nitroGLYCERIN (NITROLINGUAL) 0.4 MG/SPRAY spray Place 1 spray under the tongue every 5 (five) minutes x 3 doses as needed for chest pain.    [provider]  ranitidine (ZANTAC) 150 MG tablet Take 150 mg by mouth 2 (two) times daily. Reported on 10/23/2015    [provider]    Allergies as of 10/14/2016 - Review Complete 02/06/2016  Allergen Reaction Noted  . Biaxin [clarithromycin] Hives 05/19/2015  . Homatropine  10/23/2015  . Januvia [sitagliptin]  10/23/2015  . Macrodantin [nitrofurantoin macrocrystal]  10/23/2015  . Naproxen  10/23/2015  . Penicillins  10/23/2015  . Propoxyphene  10/23/2015  . Tramadol  10/23/2015    Family  History  Problem Relation Age of Onset  . Breast cancer Neg Hx     Social History   Social History  . Marital status: Married    Spouse name: N/A  . Number of children: N/A  . Years of education: N/A   Occupational History  . Not on file.   Social History Main Topics  . Smoking status: Never Smoker  . Smokeless tobacco: Never Used  . Alcohol use No  . Drug use: No  . Sexual activity: Not on file   Other Topics Concern  . Not on file   Social History Narrative  . No narrative on file    Review of Systems: See HPI, otherwise negative ROS  Physical  Exam: BP (!) 138/56   Pulse (!) 50   Temp (!) 96.5 F (35.8 C) (Tympanic)   Resp 14   Ht 5' (1.524 m)   Wt 72.1 kg (159 lb)   SpO2 100%   BMI 31.05 kg/m  General:   Alert,  pleasant and cooperative in NAD Head:  Normocephalic and atraumatic. Neck:  Supple; no masses or thyromegaly. Lungs:  Clear throughout to auscultation.    Heart:  Regular rate and rhythm. Abdomen:  Soft, nontender and nondistended. Normal bowel sounds, without guarding, and without rebound.   Neurologic:  Alert and  oriented x4;  grossly normal neurologically.  Impression/Plan: Christine Zimmerman is here for an colonoscopy to be performed for [facility] colon polyps.  Risks, benefits, limitations, and alternatives regarding  colonoscopy have been reviewed with the patient.  Questions have been answered.  All parties agreeable.   [name], MD  01/03/2017, 11:34 AM

## 2017-01-03 NOTE — Transfer of Care (Signed)
Immediate Anesthesia Transfer of Care Note  Patient: Christine Zimmerman  Procedure(s) Performed: Procedure(s): COLONOSCOPY WITH PROPOFOL (N/A)  Patient Location: PACU and Endoscopy Unit  Anesthesia Type:General  Level of Consciousness: drowsy and patient cooperative  Airway & Oxygen Therapy: Patient Spontanous Breathing and Patient connected to nasal cannula oxygen  Post-op Assessment: Report given to RN and Post -op Vital signs reviewed and stable  Post vital signs: Reviewed and stable  Last Vitals:  Vitals:   01/03/17 1048 01/03/17 1214  BP: (!) 138/56 (!) 109/49  Pulse: (!) 50 63  Resp: 14 16  Temp: (!) 35.8 C (!) 36.2 C  SpO2: 100% 100%    Last Pain:  Vitals:   01/03/17 1214  TempSrc: Tympanic  PainSc: Asleep         Complications: No apparent anesthesia complications

## 2017-01-03 NOTE — Anesthesia Postprocedure Evaluation (Signed)
Anesthesia Post Note  Patient: Christine Zimmerman  Procedure(s) Performed: Procedure(s) (LRB): COLONOSCOPY WITH PROPOFOL (N/A)  Patient location during evaluation: Endoscopy Anesthesia Type: General Level of consciousness: awake and alert Pain management: pain level controlled Vital Signs Assessment: post-procedure vital signs reviewed and stable Respiratory status: spontaneous breathing, nonlabored ventilation, respiratory function stable and patient connected to nasal cannula oxygen Cardiovascular status: blood pressure returned to baseline and stable Postop Assessment: no signs of nausea or vomiting Anesthetic complications: no     Last Vitals:  Vitals:   01/03/17 1234 01/03/17 1244  BP: 115/76 136/89  Pulse: (!) 54 (!) 53  Resp: 14 18  Temp:    SpO2: 100% 99%    Last Pain:  Vitals:   01/03/17 1224  TempSrc:   PainSc: Asleep                 Lenard Simmer

## 2017-01-03 NOTE — Anesthesia Post-op Follow-up Note (Signed)
Anesthesia QCDR form completed.        

## 2017-01-03 NOTE — Anesthesia Preprocedure Evaluation (Signed)
Anesthesia Evaluation  Patient identified by MRN, date of birth, ID band Patient awake    Reviewed: Allergy & Precautions, H&P , NPO status , Patient's Chart, lab work & pertinent test results, reviewed documented beta blocker date and time   History of Anesthesia Complications Negative for: history of anesthetic complications  Airway Mallampati: III  TM Distance: >3 FB Neck ROM: full    Dental  (+) Upper Dentures, Partial Lower, Dental Advidsory Given   Pulmonary neg pulmonary ROS,           Cardiovascular Exercise Tolerance: Good hypertension, (-) angina+ CAD and + Cardiac Stents  (-) Past MI and (-) CABG (-) dysrhythmias (-) Valvular Problems/Murmurs     Neuro/Psych negative neurological ROS  negative psych ROS   GI/Hepatic Neg liver ROS, GERD  ,  Endo/Other  diabetes, Type 2, Oral Hypoglycemic AgentsHypothyroidism   Renal/GU negative Renal ROS  negative genitourinary   Musculoskeletal   Abdominal   Peds  Hematology  (+) Blood dyscrasia, anemia ,   Anesthesia Other Findings Past Medical History: No date: Chronic pain No date: Coronary artery disease No date: Diabetes mellitus without complication (HCC) No date: GERD (gastroesophageal reflux disease) No date: High cholesterol No date: Hypertension No date: Hypothyroidism   Reproductive/Obstetrics negative OB ROS                             Anesthesia Physical Anesthesia Plan  ASA: III  Anesthesia Plan: General   Post-op Pain Management:    Induction: Intravenous  PONV Risk Score and Plan: 3 and Propofol infusion  Airway Management Planned: Natural Airway and Nasal Cannula  Additional Equipment:   Intra-op Plan:   Post-operative Plan:   Informed Consent: I have reviewed the patients History and Physical, chart, labs and discussed the procedure including the risks, benefits and alternatives for the proposed anesthesia  with the patient or authorized representative who has indicated his/her understanding and acceptance.   Dental Advisory Given  Plan Discussed with: Anesthesiologist, CRNA and Surgeon  Anesthesia Plan Comments:         Anesthesia Quick Evaluation

## 2017-01-04 ENCOUNTER — Encounter: Payer: Self-pay | Admitting: Unknown Physician Specialty

## 2017-03-12 ENCOUNTER — Emergency Department: Payer: Medicare Other

## 2017-03-12 ENCOUNTER — Emergency Department
Admission: EM | Admit: 2017-03-12 | Discharge: 2017-03-13 | Disposition: A | Discharge status: Home / Self Care | Payer: Medicare Other | Attending: Emergency Medicine | Admitting: Emergency Medicine

## 2017-03-12 ENCOUNTER — Encounter: Payer: Self-pay | Admitting: Emergency Medicine

## 2017-03-12 DIAGNOSIS — Y999 Unspecified external cause status: Secondary | ICD-10-CM | POA: Diagnosis not present

## 2017-03-12 DIAGNOSIS — Y939 Activity, unspecified: Secondary | ICD-10-CM | POA: Insufficient documentation

## 2017-03-12 DIAGNOSIS — Z7984 Long term (current) use of oral hypoglycemic drugs: Secondary | ICD-10-CM | POA: Insufficient documentation

## 2017-03-12 DIAGNOSIS — M25551 Pain in right hip: Secondary | ICD-10-CM

## 2017-03-12 DIAGNOSIS — E039 Hypothyroidism, unspecified: Secondary | ICD-10-CM | POA: Insufficient documentation

## 2017-03-12 DIAGNOSIS — I2510 Atherosclerotic heart disease of native coronary artery without angina pectoris: Secondary | ICD-10-CM | POA: Diagnosis not present

## 2017-03-12 DIAGNOSIS — Z7982 Long term (current) use of aspirin: Secondary | ICD-10-CM | POA: Diagnosis not present

## 2017-03-12 DIAGNOSIS — Y929 Unspecified place or not applicable: Secondary | ICD-10-CM | POA: Insufficient documentation

## 2017-03-12 DIAGNOSIS — S79911A Unspecified injury of right hip, initial encounter: Secondary | ICD-10-CM | POA: Diagnosis present

## 2017-03-12 DIAGNOSIS — M62838 Other muscle spasm: Secondary | ICD-10-CM | POA: Diagnosis not present

## 2017-03-12 DIAGNOSIS — S76011A Strain of muscle, fascia and tendon of right hip, initial encounter: Secondary | ICD-10-CM | POA: Diagnosis not present

## 2017-03-12 DIAGNOSIS — Z79899 Other long term (current) drug therapy: Secondary | ICD-10-CM | POA: Insufficient documentation

## 2017-03-12 DIAGNOSIS — E119 Type 2 diabetes mellitus without complications: Secondary | ICD-10-CM | POA: Diagnosis not present

## 2017-03-12 DIAGNOSIS — X509XXA Other and unspecified overexertion or strenuous movements or postures, initial encounter: Secondary | ICD-10-CM | POA: Insufficient documentation

## 2017-03-12 DIAGNOSIS — I10 Essential (primary) hypertension: Secondary | ICD-10-CM | POA: Diagnosis not present

## 2017-03-12 MED ORDER — ACETAMINOPHEN 325 MG PO TABS
650.0000 mg | ORAL_TABLET | Freq: Once | ORAL | Status: DC
  Filled 2017-03-12: qty 2

## 2017-03-12 NOTE — ED Triage Notes (Addendum)
Pt presents to ED with right sided thigh and hip pain. Pt states she has a hx of the same and was given hydrocodone last time she was seen for this complaint. Pt states she took another hydrocodone around 2030 tonight and reports it gave her headache.  pt was told they could not find anything wrong with her hip/leg at last visit when she was last seen for similar complaint. No known injury.

## 2017-03-12 NOTE — ED Provider Notes (Signed)
[facility] Emergency Department Provider Note   ____________________________________________   I have reviewed the triage vital signs and the nursing notes.   HISTORY  Chief Complaint Hip Pain    HPI Christine Zimmerman is a 71 y.o. female presents emergency department with right lateral hip pain that radiates up the right lateral torso to the right upper trapezius muscle.  She reports increased symptoms when she bends her torso, turns her head and stretches associated musculature.  Patient denies any traumatic injury associated with current symptoms.  Patient describes pain as sharp and catching.  Patient denies cardiac in nature chest pain, shortness of breath pain that radiates down the extremities, nausea or vomiting.  Patient reports current symptoms are the exact symptoms she experienced while being in the emergency department in July of this year.  Patient reports taking an aspirin when she experienced the symptoms earlier today.  When she attempted to lay down for the evening she took hydrocodone and then developed a headache.  Patient spoke with her husband and felt she needed to come to the emergency department so she had a neighbor bring her.  Patient communicated with the triage nurse that when she was here the last time during the evaluation they found nothing wrong with her although she felt something was wrong.   Patient denies fever, chills, headache, vision changes, chest pain, chest tightness, shortness of breath, abdominal pain, nausea and vomiting.  Past Medical History:  Diagnosis Date  . Chronic pain   . Coronary artery disease   . Diabetes mellitus without complication (HCC)   . GERD (gastroesophageal reflux disease)   . High cholesterol   . Hypertension   . Hypothyroidism     Patient Active Problem List   Diagnosis Date Noted  . TIA (transient ischemic attack) 10/17/2015  . Hyponatremia 10/17/2015  . Anemia 10/17/2015  . Chest pain  10/16/2015  . Left-sided weakness 10/16/2015  . Hypotension 10/16/2015  . Bradycardia 10/16/2015  . Hypoglycemia 10/16/2015    Past Surgical History:  Procedure Laterality Date  . BACK SURGERY    . CARDIAC CATHETERIZATION Left 10/23/2015   Procedure: Left Heart Cath and Coronary Angiography;  Surgeon: Alwyn Pea, MD;  Location: [facility] INVASIVE CV LAB;  Service: Cardiovascular;  Laterality: Left;  . COLONOSCOPY WITH PROPOFOL N/A 01/03/2017   Procedure: COLONOSCOPY WITH PROPOFOL;  Surgeon: Scot Jun, MD;  Location: [facility] ENDOSCOPY;  Service: Endoscopy;  Laterality: N/A;  . CORONARY STENT PLACEMENT     x 4 at [facility]. x 1 by [facility] in 2004    Prior to Admission medications   Medication Sig Start Date End Date Taking? Authorizing Provider  amLODipine (NORVASC) 2.5 MG tablet Take 2.5 mg by mouth daily.    [provider]  aspirin EC 81 MG tablet Take 81 mg by mouth daily.    [provider]  atorvastatin (LIPITOR) 80 MG tablet Take 80 mg by mouth daily.    [provider]  calcium-vitamin D (OSCAL 500/200 D-3) 500-200 MG-UNIT tablet Take 1 tablet by mouth daily.    [provider]  clopidogrel (PLAVIX) 75 MG tablet Take 75 mg by mouth daily.    [provider]  cyanocobalamin (,VITAMIN B-12,) 1000 MCG/ML injection Inject 1,000 mcg into the muscle every 30 (thirty) days.    [provider]  ferrous sulfate 325 (65 FE) MG tablet Take 325 mg by mouth daily with breakfast.    [provider]  gabapentin (  NEURONTIN) 300 MG capsule Take 300 mg by mouth 2 (two) times daily.     [provider]  glipiZIDE (GLUCOTROL) 10 MG tablet Take 0.5 tablets (5 mg total) by mouth 2 (two) times daily before a meal. 10/17/15   Katharina CaperVaickute, Rima, MD  HYDROcodone-acetaminophen (NORCO/VICODIN) 5-325 MG tablet Take 1 tablet by mouth every 4 (four) hours as needed. Patient not taking: Reported on 01/03/2017 12/01/16   Minna AntisPaduchowski, Kevin, MD    isosorbide mononitrate (IMDUR) 30 MG 24 hr tablet Take 30 mg by mouth daily.    [provider]  levothyroxine (SYNTHROID, LEVOTHROID) 75 MCG tablet Take 75 mcg by mouth daily before breakfast.    [provider]  lisinopril (PRINIVIL,ZESTRIL) 20 MG tablet Take 20 mg by mouth daily.    [provider]  metFORMIN (GLUCOPHAGE) 1000 MG tablet Take 1,000 mg by mouth 2 (two) times daily.    [provider]  niacin 500 MG tablet Take 500 mg by mouth daily.    [provider]  nitroGLYCERIN (NITROLINGUAL) 0.4 MG/SPRAY spray Place 1 spray under the tongue every 5 (five) minutes x 3 doses as needed for chest pain.    [provider]  ranitidine (ZANTAC) 150 MG tablet Take 150 mg by mouth 2 (two) times daily. Reported on 10/23/2015    [provider]    Allergies Biaxin [clarithromycin]; Homatropine; Januvia [sitagliptin]; Macrodantin [nitrofurantoin macrocrystal]; Naproxen; Penicillins; Propoxyphene; and Tramadol  Family History  Problem Relation Age of Onset  . Breast cancer Neg Hx     Social History Social History  Substance Use Topics  . Smoking status: Never Smoker  . Smokeless tobacco: Never Used  . Alcohol use No    Review of Systems Constitutional: Negative for fever/chills Cardiovascular: Denies chest pain. Respiratory: Denies cough. Denies shortness of breath. Gastrointestinal: No abdominal pain.  No nausea, vomiting, diarrhea. Genitourinary: Negative for dysuria. Musculoskeletal: Positive for right side lumbar back pain and lateral hip pain. Skin: Negative for rash. Neurological: Negative for headaches.  Negative focal weakness or numbness. Able to ambulate. ____________________________________________   PHYSICAL EXAM:  VITAL SIGNS: ED Triage Vitals  Enc Vitals Group     BP 03/12/17 2143 (!) 142/63     Pulse Rate 03/12/17 2143 (!) 56     Resp 03/12/17 2143 18     Temp 03/12/17 2143 97.7 F (36.5 C)     Temp  Source 03/12/17 2143 Oral     SpO2 03/12/17 2143 97 %     Weight 03/12/17 2144 155 lb (70.3 kg)     Height 03/12/17 2144 5' (1.524 m)     Head Circumference --      Peak Flow --      Pain Score 03/12/17 2142 3     Pain Loc --      Pain Edu? --      Excl. in GC? --     Constitutional: Alert and oriented. Well appearing and in no acute distress.  Eyes: Conjunctivae are normal. PERRL. EOMI  Head: Normocephalic and atraumatic. Cardiovascular: Normal rate, regular rhythm. Respiratory: Normal respiratory effort without tachypnea or retractions. Lungs CTAB. No wheezes/rales/rhonchi.  Gastrointestinal: Bowel sounds 4 quadrants. Soft and nontender to palpation. No CVA tenderness. Musculoskeletal: Nontender with normal range of motion in all extremities. Neurologic: Normal speech and language. Skin:  Skin is warm, dry and intact. No rash noted. Psychiatric: Mood and affect are normal. Speech and behavior are normal. Patient exhibits appropriate insight and judgement.  ____________________________________________  LABS (all labs ordered are listed, but only abnormal results are displayed)  Labs Reviewed - No data to display ____________________________________________  EKG none ____________________________________________  RADIOLOGY DG lumbar complete FINDINGS: Lumbar alignment is within normal limits. Moderate disc space narrowing and degenerative changes at L4-L5 and L5-S1. Mild degenerative changes L1 through L4. Vertebral body heights are normal. Phleboliths in the pelvis. Facet degenerative changes of the lower lumbar spine  IMPRESSION: Diffuse degenerative disc changes, moderate-to-marked at L4-L5 and L5-S1. No acute osseous abnormality. ____________________________________________   PROCEDURES  Procedure(s) performed: no    Critical Care performed: no ____________________________________________   INITIAL IMPRESSION / ASSESSMENT AND PLAN / ED  COURSE  Pertinent labs & imaging results that were available during my care of the patient were reviewed by me and considered in my medical decision making (see chart for details).   Patient presents to emergency department with right side lumbar and lateral hip pain without traumatic injury.  History, physical exam findings and imaging are reassuring of no acute fracture and symptoms are consistent with muscle strain and spasms. Imaging consistent with degenerative disc and joint disease.  Recommended patient continue current prescribed medications as well as taking over-the-counter Tylenol as needed for pain management.  He also advised patient to follow-up with her neurologist and primary care provider for continued care. Advised return to the emergency department if symptoms significant worsened prior to any scheduled appointment. Patient informed of clinical course, understand medical decision-making process, and agree with plan. ____________________________________________   FINAL CLINICAL IMPRESSION(S) / ED DIAGNOSES  Final diagnoses:  Lateral pain of right hip  Strain of muscle of right hip, initial encounter  Muscle spasms of neck       NEW MEDICATIONS STARTED DURING THIS VISIT:  New Prescriptions   No medications on file     Note:  This document was prepared using Dragon voice recognition software and may include unintentional dictation errors.    Clois Comber, PA-C 03/12/17 2347    Jeanmarie Plant, MD 03/13/17 [phone]

## 2017-03-12 NOTE — Discharge Instructions (Signed)
Continue all prescribed medications as needed.  You may also take Tylenol as needed for pain.  All assessments and imaging performed tonight were unremarkable for any acute injuries.

## 2017-03-13 NOTE — ED Notes (Signed)
Pt reports she can not call to wake her husband because he needs his rest. Pt is in lobby with warm blankets and water and reports she will call husband at 4 in the morning. NAD at this time

## 2017-05-29 ENCOUNTER — Emergency Department
Admission: EM | Admit: 2017-05-29 | Discharge: 2017-05-29 | Disposition: A | Discharge status: Home / Self Care | Payer: Medicare Other | Attending: Emergency Medicine | Admitting: Emergency Medicine

## 2017-05-29 ENCOUNTER — Encounter: Payer: Self-pay | Admitting: Emergency Medicine

## 2017-05-29 ENCOUNTER — Emergency Department: Payer: Medicare Other

## 2017-05-29 ENCOUNTER — Other Ambulatory Visit: Payer: Self-pay

## 2017-05-29 DIAGNOSIS — Z79899 Other long term (current) drug therapy: Secondary | ICD-10-CM | POA: Insufficient documentation

## 2017-05-29 DIAGNOSIS — R51 Headache: Secondary | ICD-10-CM | POA: Diagnosis not present

## 2017-05-29 DIAGNOSIS — Z7984 Long term (current) use of oral hypoglycemic drugs: Secondary | ICD-10-CM | POA: Diagnosis not present

## 2017-05-29 DIAGNOSIS — Z7982 Long term (current) use of aspirin: Secondary | ICD-10-CM | POA: Diagnosis not present

## 2017-05-29 DIAGNOSIS — Z7902 Long term (current) use of antithrombotics/antiplatelets: Secondary | ICD-10-CM | POA: Insufficient documentation

## 2017-05-29 DIAGNOSIS — Z955 Presence of coronary angioplasty implant and graft: Secondary | ICD-10-CM | POA: Insufficient documentation

## 2017-05-29 DIAGNOSIS — E039 Hypothyroidism, unspecified: Secondary | ICD-10-CM | POA: Insufficient documentation

## 2017-05-29 DIAGNOSIS — I10 Essential (primary) hypertension: Secondary | ICD-10-CM | POA: Insufficient documentation

## 2017-05-29 DIAGNOSIS — I2510 Atherosclerotic heart disease of native coronary artery without angina pectoris: Secondary | ICD-10-CM | POA: Insufficient documentation

## 2017-05-29 DIAGNOSIS — R079 Chest pain, unspecified: Secondary | ICD-10-CM

## 2017-05-29 DIAGNOSIS — E119 Type 2 diabetes mellitus without complications: Secondary | ICD-10-CM | POA: Diagnosis not present

## 2017-05-29 HISTORY — DX: Unspecified atrial fibrillation: I48.91

## 2017-05-29 LAB — BASIC METABOLIC PANEL
ANION GAP: 10 (ref 5–15)
BUN: 8 mg/dL (ref 6–20)
CO2: 26 mmol/L (ref 22–32)
Calcium: 9.1 mg/dL (ref 8.9–10.3)
Chloride: 96 mmol/L — ABNORMAL LOW (ref 101–111)
Creatinine, Ser: 0.73 mg/dL (ref 0.44–1.00)
GFR calc non Af Amer: >60 mL/min (ref >60)
Glucose, Bld: 196 mg/dL — ABNORMAL HIGH (ref 65–99)
Potassium: 3.7 mmol/L (ref 3.5–5.1)
Sodium: 132 mmol/L — ABNORMAL LOW (ref 135–145)

## 2017-05-29 LAB — CBC
HCT: 38.5 % (ref 35.0–47.0)
HEMOGLOBIN: 13.2 g/dL (ref 12.0–16.0)
MCH: 31.1 pg (ref 26.0–34.0)
MCHC: 34.2 g/dL (ref 32.0–36.0)
MCV: 90.8 fL (ref 80.0–100.0)
Platelets: 359 10*3/uL (ref 150–440)
RBC: 4.25 MIL/uL (ref 3.80–5.20)
RDW: 13.1 % (ref 11.5–14.5)
WBC: 6 10*3/uL (ref 3.6–11.0)

## 2017-05-29 LAB — TROPONIN I
Troponin I: <0.03 ng/mL (ref <0.03)
Troponin I: <0.03 ng/mL (ref <0.03)

## 2017-05-29 MED ORDER — ASPIRIN 81 MG PO CHEW
243.0000 mg | CHEWABLE_TABLET | Freq: Once | ORAL | Status: AC
  Administered 2017-05-29: 243 mg via ORAL
  Filled 2017-05-29: qty 3

## 2017-05-29 MED ORDER — ACETAMINOPHEN 500 MG PO TABS
1000.0000 mg | ORAL_TABLET | Freq: Once | ORAL | Status: AC
  Administered 2017-05-29: 1000 mg via ORAL
  Filled 2017-05-29: qty 2

## 2017-05-29 NOTE — ED Notes (Signed)
AAOx3.  Skin warm and dry.  NAD.  Updated on wait time.  Continue to monitor.

## 2017-05-29 NOTE — ED Triage Notes (Signed)
First Nurse Note:  Arrives with c/o HTN today.  Reprots cardiac history and states that earlier today she was feeling chest heaviness, 1 NTG taken with relief.  Patient also states that today at around 1100 had an episode of left facial numbness, which has resolved.  Patient is AAOx3.  Skin warm and dry .Marland Kitchen. MAE equally and strong.  equal sensation and movement observed.

## 2017-05-29 NOTE — ED Triage Notes (Signed)
Started today with chest heaviness and headache.  Took ASA and NTG.  NTG helped chest heaviness but still present.  Pt reports a jerk earlier to left leg and left arm.  Thought it was hard to talk. Speech clear. Alert and oriented.  No unilateral weakness or facial droop.

## 2017-05-29 NOTE — ED Provider Notes (Signed)
[facility] Emergency Department Provider Note  Time seen: 6:05 PM  I have reviewed the triage vital signs and the nursing notes.   HISTORY  Chief Complaint Chest Pain and Headache    HPI Christine Zimmerman is a 72 y.o. female with a past medical history of atrial fibrillation, chronic pain, diabetes, gastric reflux, hypertension, hyperlipidemia, CAD with 5 stents per patient, presents to the emergency department for chest pain and headache.  According to the patient around 12 PM today she developed significant chest pain which she describes as a heaviness sensation.  Patient denies any nausea shortness of breath or diaphoresis.  Patient took nitroglycerin and developed a significant headache but states no relief of the chest pain.  Continues to have what she describes as 6/10 pain in the chest described as heaviness.  Denies any leg pain or swelling.  Patient states while she was laying down hoping the chest pain alleviates she began having "jumping" of the left upper extremity and left lower extremity, became concerned that she could be having a stroke.  She checked her blood pressure and states the systolic was 205, so she came to the emergency department for evaluation.  Patient took an 81 mg aspirin last night as well as one today at noon.   Past Medical History:  Diagnosis Date  . Atrial fibrillation (HCC)   . Chronic pain   . Coronary artery disease   . Diabetes mellitus without complication (HCC)   . GERD (gastroesophageal reflux disease)   . High cholesterol   . Hypertension   . Hypothyroidism     Patient Active Problem List   Diagnosis Date Noted  . TIA (transient ischemic attack) 10/17/2015  . Hyponatremia 10/17/2015  . Anemia 10/17/2015  . Chest pain 10/16/2015  . Left-sided weakness 10/16/2015  . Hypotension 10/16/2015  . Bradycardia 10/16/2015  . Hypoglycemia 10/16/2015    Past Surgical History:  Procedure Laterality Date  . BACK SURGERY     . CARDIAC CATHETERIZATION Left 10/23/2015   Procedure: Left Heart Cath and Coronary Angiography;  Surgeon: Alwyn Pea, MD;  Location: [facility] INVASIVE CV LAB;  Service: Cardiovascular;  Laterality: Left;  . COLONOSCOPY WITH PROPOFOL N/A 01/03/2017   Procedure: COLONOSCOPY WITH PROPOFOL;  Surgeon: Scot Jun, MD;  Location: [facility] ENDOSCOPY;  Service: Endoscopy;  Laterality: N/A;  . CORONARY STENT PLACEMENT     x 4 at [facility]. x 1 by [facility] in 2004    Prior to Admission medications   Medication Sig Start Date End Date Taking? Authorizing Provider  amLODipine (NORVASC) 2.5 MG tablet Take 2.5 mg by mouth daily.    [provider]  aspirin EC 81 MG tablet Take 81 mg by mouth daily.    [provider]  atorvastatin (LIPITOR) 80 MG tablet Take 80 mg by mouth daily.    [provider]  calcium-vitamin D (OSCAL 500/200 D-3) 500-200 MG-UNIT tablet Take 1 tablet by mouth daily.    [provider]  clopidogrel (PLAVIX) 75 MG tablet Take 75 mg by mouth daily.    [provider]  cyanocobalamin (,VITAMIN B-12,) 1000 MCG/ML injection Inject 1,000 mcg into the muscle every 30 (thirty) days.    [provider]  ferrous sulfate 325 (65 FE) MG tablet Take 325 mg by mouth daily with breakfast.    [provider]  gabapentin (NEURONTIN) 300 MG capsule Take 300 mg by mouth 2 (two) times daily.     [provider]  glipiZIDE (GLUCOTROL) 10 MG tablet Take 0.5 tablets (5 mg total) by mouth 2 (two) times daily before a meal. 10/17/15   Katharina Caper, MD  HYDROcodone-acetaminophen (NORCO/VICODIN) 5-325 MG tablet Take 1 tablet by mouth every 4 (four) hours as needed. Patient not taking: Reported on 01/03/2017 12/01/16   Minna Antis, MD  isosorbide mononitrate (IMDUR) 30 MG 24 hr tablet Take 30 mg by mouth daily.    [provider]  levothyroxine (SYNTHROID, LEVOTHROID) 75 MCG tablet Take 75 mcg by mouth daily before breakfast.     [provider]  lisinopril (PRINIVIL,ZESTRIL) 20 MG tablet Take 20 mg by mouth daily.    [provider]  metFORMIN (GLUCOPHAGE) 1000 MG tablet Take 1,000 mg by mouth 2 (two) times daily.    [provider]  niacin 500 MG tablet Take 500 mg by mouth daily.    [provider]  nitroGLYCERIN (NITROLINGUAL) 0.4 MG/SPRAY spray Place 1 spray under the tongue every 5 (five) minutes x 3 doses as needed for chest pain.    [provider]  ranitidine (ZANTAC) 150 MG tablet Take 150 mg by mouth 2 (two) times daily. Reported on 10/23/2015    [provider]    Allergies  Allergen Reactions  . Biaxin [Clarithromycin] Hives  . Homatropine     hycodan  . Januvia [Sitagliptin]   . Macrodantin [Nitrofurantoin Macrocrystal]   . Naproxen   . Penicillins   . Propoxyphene     Darvocet  . Tramadol     Family History  Problem Relation Age of Onset  . Breast cancer Neg Hx     Social History Social History   Tobacco Use  . Smoking status: Never Smoker  . Smokeless tobacco: Never Used  Substance Use Topics  . Alcohol use: No  . Drug use: No    Review of Systems Constitutional: Negative for fever. Eyes: Negative for visual complaints ENT: Negative for recent illness/congestion Cardiovascular: Positive for chest pain, 6/10 currently. Respiratory: Negative for shortness of breath. Gastrointestinal: Negative for abdominal pain, vomiting  Genitourinary: Negative for urinary compaints Musculoskeletal: Negative for leg pain or swelling Skin: Negative for skin complaints  Neurological: Moderate headache All other ROS negative  ____________________________________________   PHYSICAL EXAM:  VITAL SIGNS: ED Triage Vitals  Enc Vitals Group     BP 05/29/17 1631 (!) 162/47     Pulse Rate 05/29/17 1631 (!) 57     Resp 05/29/17 1631 16     Temp 05/29/17 1632 98.7 F (37.1 C)     Temp Source 05/29/17 1632 Oral     SpO2 05/29/17 1631 99 %      Weight 05/29/17 1631 154 lb (69.9 kg)     Height 05/29/17 1631 5' (1.524 m)     Head Circumference --      Peak Flow --      Pain Score 05/29/17 1631 5     Pain Loc --      Pain Edu? --      Excl. in GC? --     Constitutional: Alert and oriented. Well appearing and in no distress. Eyes: Normal exam ENT   Head: Normocephalic and atraumatic.   Mouth/Throat: Mucous membranes are moist. Cardiovascular: Normal rate, regular rhythm. Respiratory: Normal respiratory effort without tachypnea nor retractions. Breath sounds are clear  Gastrointestinal: Soft and nontender. No distention.  Musculoskeletal: Nontender with normal range of motion in all extremities. No lower extremity tenderness or edema. Neurologic:  Normal speech and language.  No gross focal neurologic deficits.  Equal grip strength.  No pronator drift.  No cranial nerve deficit. Skin:  Skin is warm, dry and intact.  Psychiatric: Mood and affect are normal. Speech and behavior are normal.   ____________________________________________    EKG  EKG reviewed and interpreted by myself shows sinus bradycardia at 56 bpm with a widened QRS, normal axis, largely normal intervals with no concerning ST changes.  ____________________________________________    RADIOLOGY  CT scan of the head is negative Chest x-ray is normal.  ____________________________________________   INITIAL IMPRESSION / ASSESSMENT AND PLAN / ED COURSE  Pertinent labs & imaging results that were available during my care of the patient were reviewed by me and considered in my medical decision making (see chart for details).  Patient presents to the emergency department for chest pain, headache and concern for left-sided symptoms which she describes as jumping.  Currently the patient appears well.  Differential would include ACS, angina, CVA, chest wall pain, headache, nitroglycerin induced headache.  We will check labs and closely monitor in the  emergency department.  We will dose Tylenol for headache as well as 3 additional aspirin.  Overall the patient appears very well, no distress.  Patient's workup so far has been largely nonrevealing.  CT scan of the head is normal.  Chest x-ray is normal.  Labs including troponin are normal.  Viewed the patient's records, she appears to have fairly frequent chest pain/pressure complaints.  Patient just saw her cardiologist in December with a stress test that was negative in December.  Patient states somewhat improving symptoms now that her blood pressure has decreased.  We will dose Tylenol, aspirin continue to closely monitor.  We will repeat her troponin in approximately 1 hour.  If her troponin remains negative and her symptoms improve I believe she could safely be discharged home.  Discussed the patient with Dr. Gwen PoundsKowalski who also agrees with a recent normal stress test of her second troponin is negative she could be discharged home and follow-up tomorrow in the office.  Repeat troponin is negative.  She is feeling much better.  Patient will follow up with cardiology by calling Dr. Juliann Parescallwood in the morning.  I discussed my normal chest pain return precautions.  ____________________________________________   FINAL CLINICAL IMPRESSION(S) / ED DIAGNOSES  Chest pain Headache    Minna AntisPaduchowski, Cresencia Asmus, MD 05/29/17 2010

## 2017-05-29 NOTE — ED Notes (Signed)
Discussed pt with dr Lenard Lancepaduchowski. Cardiac work up with head CT ordered.

## 2017-05-29 NOTE — Discharge Instructions (Signed)
You have been seen in the emergency department today for chest pain. Your workup has shown normal results. As we discussed please follow-up with your primary care physician in the next 1-2 days for recheck. Return to the emergency department for any further chest pain, trouble breathing, or any other symptom personally concerning to yourself. °

## 2017-06-08 ENCOUNTER — Ambulatory Visit
Admission: RE | Admit: 2017-06-08 | Discharge: 2017-06-08 | Disposition: A | Discharge status: Home / Self Care | Payer: Medicare Other | Source: Ambulatory Visit | Attending: Internal Medicine | Admitting: Internal Medicine

## 2017-06-08 ENCOUNTER — Encounter
Admission: RE | Disposition: A | Discharge status: Home / Self Care | Payer: Self-pay | Source: Ambulatory Visit | Attending: Internal Medicine

## 2017-06-08 DIAGNOSIS — D649 Anemia, unspecified: Secondary | ICD-10-CM | POA: Diagnosis not present

## 2017-06-08 DIAGNOSIS — Z955 Presence of coronary angioplasty implant and graft: Secondary | ICD-10-CM | POA: Diagnosis not present

## 2017-06-08 DIAGNOSIS — Z7902 Long term (current) use of antithrombotics/antiplatelets: Secondary | ICD-10-CM | POA: Insufficient documentation

## 2017-06-08 DIAGNOSIS — I480 Paroxysmal atrial fibrillation: Secondary | ICD-10-CM | POA: Diagnosis not present

## 2017-06-08 DIAGNOSIS — R011 Cardiac murmur, unspecified: Secondary | ICD-10-CM | POA: Insufficient documentation

## 2017-06-08 DIAGNOSIS — R42 Dizziness and giddiness: Secondary | ICD-10-CM | POA: Diagnosis not present

## 2017-06-08 DIAGNOSIS — E039 Hypothyroidism, unspecified: Secondary | ICD-10-CM | POA: Diagnosis not present

## 2017-06-08 DIAGNOSIS — Z7984 Long term (current) use of oral hypoglycemic drugs: Secondary | ICD-10-CM | POA: Diagnosis not present

## 2017-06-08 DIAGNOSIS — Z8249 Family history of ischemic heart disease and other diseases of the circulatory system: Secondary | ICD-10-CM | POA: Insufficient documentation

## 2017-06-08 DIAGNOSIS — I10 Essential (primary) hypertension: Secondary | ICD-10-CM | POA: Diagnosis not present

## 2017-06-08 DIAGNOSIS — I25119 Atherosclerotic heart disease of native coronary artery with unspecified angina pectoris: Secondary | ICD-10-CM | POA: Insufficient documentation

## 2017-06-08 DIAGNOSIS — R0602 Shortness of breath: Secondary | ICD-10-CM | POA: Diagnosis not present

## 2017-06-08 DIAGNOSIS — E119 Type 2 diabetes mellitus without complications: Secondary | ICD-10-CM | POA: Diagnosis not present

## 2017-06-08 DIAGNOSIS — K219 Gastro-esophageal reflux disease without esophagitis: Secondary | ICD-10-CM | POA: Insufficient documentation

## 2017-06-08 DIAGNOSIS — E782 Mixed hyperlipidemia: Secondary | ICD-10-CM | POA: Insufficient documentation

## 2017-06-08 DIAGNOSIS — Z79899 Other long term (current) drug therapy: Secondary | ICD-10-CM | POA: Insufficient documentation

## 2017-06-08 DIAGNOSIS — Z7982 Long term (current) use of aspirin: Secondary | ICD-10-CM | POA: Diagnosis not present

## 2017-06-08 DIAGNOSIS — I209 Angina pectoris, unspecified: Secondary | ICD-10-CM

## 2017-06-08 DIAGNOSIS — Z8673 Personal history of transient ischemic attack (TIA), and cerebral infarction without residual deficits: Secondary | ICD-10-CM | POA: Insufficient documentation

## 2017-06-08 HISTORY — PX: LEFT HEART CATH AND CORONARY ANGIOGRAPHY: CATH118249

## 2017-06-08 LAB — GLUCOSE, CAPILLARY
GLUCOSE-CAPILLARY: 203 mg/dL — AB (ref 65–99)
Glucose-Capillary: 121 mg/dL — ABNORMAL HIGH (ref 65–99)

## 2017-06-08 SURGERY — LEFT HEART CATH AND CORONARY ANGIOGRAPHY
Anesthesia: Moderate Sedation

## 2017-06-08 MED ORDER — FENTANYL CITRATE (PF) 100 MCG/2ML IJ SOLN
INTRAMUSCULAR | Status: DC | PRN
  Administered 2017-06-08: 25 ug via INTRAVENOUS

## 2017-06-08 MED ORDER — ACETAMINOPHEN 325 MG PO TABS: 650.0000 mg | ORAL_TABLET | ORAL | Status: DC | PRN

## 2017-06-08 MED ORDER — SODIUM CHLORIDE 0.9% FLUSH: 3.0000 mL | Freq: Two times a day (BID) | INTRAVENOUS | Status: DC

## 2017-06-08 MED ORDER — IOPAMIDOL (ISOVUE-300) INJECTION 61%
INTRAVENOUS | Status: DC | PRN
  Administered 2017-06-08: 90 mL via INTRA_ARTERIAL

## 2017-06-08 MED ORDER — FENTANYL CITRATE (PF) 100 MCG/2ML IJ SOLN
INTRAMUSCULAR | Status: AC
  Filled 2017-06-08: qty 2

## 2017-06-08 MED ORDER — SODIUM CHLORIDE 0.9% FLUSH: 3.0000 mL | INTRAVENOUS | Status: DC | PRN

## 2017-06-08 MED ORDER — MIDAZOLAM HCL 2 MG/2ML IJ SOLN
INTRAMUSCULAR | Status: AC
  Filled 2017-06-08: qty 2

## 2017-06-08 MED ORDER — SODIUM CHLORIDE 0.9 % WEIGHT BASED INFUSION
1.0000 mL/kg/h | INTRAVENOUS | Status: DC
  Administered 2017-06-08: 1 mL/kg/h via INTRAVENOUS

## 2017-06-08 MED ORDER — SODIUM CHLORIDE 0.9 % WEIGHT BASED INFUSION: 1.0000 mL/kg/h | INTRAVENOUS | Status: DC

## 2017-06-08 MED ORDER — ONDANSETRON HCL 4 MG/2ML IJ SOLN: 4.0000 mg | Freq: Four times a day (QID) | INTRAMUSCULAR | Status: DC | PRN

## 2017-06-08 MED ORDER — ASPIRIN 81 MG PO CHEW: 81.0000 mg | CHEWABLE_TABLET | ORAL | Status: DC

## 2017-06-08 MED ORDER — SODIUM CHLORIDE 0.9 % IV SOLN: 250.0000 mL | INTRAVENOUS | Status: DC | PRN

## 2017-06-08 MED ORDER — MIDAZOLAM HCL 2 MG/2ML IJ SOLN
INTRAMUSCULAR | Status: DC | PRN
  Administered 2017-06-08: 1 mg via INTRAVENOUS

## 2017-06-08 MED ORDER — SODIUM CHLORIDE 0.9 % WEIGHT BASED INFUSION
3.0000 mL/kg/h | INTRAVENOUS | Status: AC
  Administered 2017-06-08: 3 mL/kg/h via INTRAVENOUS

## 2017-06-08 SURGICAL SUPPLY — 10 items
CATH INFINITI 5FR ANG PIGTAIL (CATHETERS) ×2 IMPLANT
CATH INFINITI 5FR JL4 (CATHETERS) ×2 IMPLANT
CATH INFINITI JR4 5F (CATHETERS) ×2 IMPLANT
DEVICE CLOSURE MYNXGRIP 5F (Vascular Products) ×2 IMPLANT
KIT MANI 3VAL PERCEP (MISCELLANEOUS) ×3 IMPLANT
NDL PERC 18GX7CM (NEEDLE) IMPLANT
NEEDLE PERC 18GX7CM (NEEDLE) ×3 IMPLANT
PACK CARDIAC CATH (CUSTOM PROCEDURE TRAY) ×3 IMPLANT
SHEATH AVANTI 5FR X 11CM (SHEATH) ×2 IMPLANT
WIRE GUIDERIGHT .035X150 (WIRE) ×2 IMPLANT

## 2017-06-08 NOTE — Progress Notes (Signed)
While sitting up on side of bed, Pt begins to complain of severe neck pain, 7/10 and numbness in upper thigh. R groin site intact, right leg warm and pink(WNL). Decreased sensation in right thigh. VS obtained and Dr. Juliann Paresallwood paged. Pt resting in bed complains pain is now in neck and midline shoulder blades.Denies chest pain or SOB. EKG obtained unchanged from prior EKG. Pt admits to numbness in right thigh prior to arrival, mostly at night per Pt. Dr. Juliann Paresallwood at bedside to assess Pt, no orders at this time. Pt ok to discharge home per MD.

## 2017-06-08 NOTE — Progress Notes (Signed)
Pt assisted in getting dressed, complains of continued numbness in right thigh somewhat improved. Neck pain 3/10. [facility] and able to ambulate well with assist to car. D/C home with D/C instructions reviewed, follow up appt with Dr. Juliann Paresallwood in place.

## 2017-06-08 NOTE — Progress Notes (Signed)
Attempted to get 2nd IV pre procedure in left hand pre procedure. Despite Patient having large veins, unable to advance catheter. Catheter removed intact. Site with bruising and small quarter size rounded swelling with redness at insertion site. Pressure held at site. Post procedure site with bruising and quarter size swelling unchanged. Pt complains of mild pain at site. Ice pack placed.

## 2017-06-09 ENCOUNTER — Encounter: Payer: Self-pay | Admitting: Internal Medicine

## 2017-06-09 LAB — CARDIAC CATHETERIZATION: CATHEFQUANT: 55 %

## 2017-06-21 ENCOUNTER — Ambulatory Visit: Payer: Medicare Other | Admitting: Physical Therapy

## 2017-06-27 ENCOUNTER — Ambulatory Visit: Payer: Medicare Other | Admitting: Physical Therapy

## 2017-06-29 ENCOUNTER — Ambulatory Visit: Payer: Medicare Other | Admitting: Physical Therapy

## 2017-07-04 ENCOUNTER — Ambulatory Visit: Payer: Medicare Other | Admitting: Physical Therapy

## 2017-07-06 ENCOUNTER — Ambulatory Visit: Payer: Medicare Other | Admitting: Physical Therapy

## 2017-07-11 ENCOUNTER — Encounter: Payer: Self-pay | Admitting: Emergency Medicine

## 2017-07-11 ENCOUNTER — Emergency Department
Admission: EM | Admit: 2017-07-11 | Discharge: 2017-07-11 | Disposition: A | Discharge status: Home / Self Care | Payer: Medicare Other | Attending: Emergency Medicine | Admitting: Emergency Medicine

## 2017-07-11 ENCOUNTER — Other Ambulatory Visit: Payer: Self-pay

## 2017-07-11 ENCOUNTER — Emergency Department: Payer: Medicare Other

## 2017-07-11 DIAGNOSIS — E119 Type 2 diabetes mellitus without complications: Secondary | ICD-10-CM | POA: Insufficient documentation

## 2017-07-11 DIAGNOSIS — R55 Syncope and collapse: Secondary | ICD-10-CM | POA: Insufficient documentation

## 2017-07-11 DIAGNOSIS — Z7902 Long term (current) use of antithrombotics/antiplatelets: Secondary | ICD-10-CM | POA: Diagnosis not present

## 2017-07-11 DIAGNOSIS — I2510 Atherosclerotic heart disease of native coronary artery without angina pectoris: Secondary | ICD-10-CM | POA: Insufficient documentation

## 2017-07-11 DIAGNOSIS — Z7982 Long term (current) use of aspirin: Secondary | ICD-10-CM | POA: Diagnosis not present

## 2017-07-11 DIAGNOSIS — R202 Paresthesia of skin: Secondary | ICD-10-CM

## 2017-07-11 DIAGNOSIS — Z7984 Long term (current) use of oral hypoglycemic drugs: Secondary | ICD-10-CM | POA: Insufficient documentation

## 2017-07-11 DIAGNOSIS — E039 Hypothyroidism, unspecified: Secondary | ICD-10-CM | POA: Diagnosis not present

## 2017-07-11 DIAGNOSIS — R531 Weakness: Secondary | ICD-10-CM | POA: Diagnosis present

## 2017-07-11 DIAGNOSIS — I10 Essential (primary) hypertension: Secondary | ICD-10-CM | POA: Insufficient documentation

## 2017-07-11 DIAGNOSIS — Z79899 Other long term (current) drug therapy: Secondary | ICD-10-CM | POA: Diagnosis not present

## 2017-07-11 LAB — COMPREHENSIVE METABOLIC PANEL
ALT: 18 U/L (ref 14–54)
ANION GAP: 8 (ref 5–15)
AST: 19 U/L (ref 15–41)
Albumin: 4.1 g/dL (ref 3.5–5.0)
Alkaline Phosphatase: 67 U/L (ref 38–126)
BILIRUBIN TOTAL: 0.7 mg/dL (ref 0.3–1.2)
BUN: 12 mg/dL (ref 6–20)
CALCIUM: 9.1 mg/dL (ref 8.9–10.3)
CO2: 26 mmol/L (ref 22–32)
CREATININE: 0.8 mg/dL (ref 0.44–1.00)
Chloride: 98 mmol/L — ABNORMAL LOW (ref 101–111)
GFR calc non Af Amer: >60 mL/min (ref >60)
GLUCOSE: 333 mg/dL — AB (ref 65–99)
Potassium: 4.3 mmol/L (ref 3.5–5.1)
Sodium: 132 mmol/L — ABNORMAL LOW (ref 135–145)
TOTAL PROTEIN: 6.7 g/dL (ref 6.5–8.1)

## 2017-07-11 LAB — CBC
HEMATOCRIT: 38.4 % (ref 35.0–47.0)
HEMOGLOBIN: 12.8 g/dL (ref 12.0–16.0)
MCH: 29.7 pg (ref 26.0–34.0)
MCHC: 33.3 g/dL (ref 32.0–36.0)
MCV: 89.2 fL (ref 80.0–100.0)
Platelets: 348 10*3/uL (ref 150–440)
RBC: 4.31 MIL/uL (ref 3.80–5.20)
RDW: 13.7 % (ref 11.5–14.5)
WBC: 6.1 10*3/uL (ref 3.6–11.0)

## 2017-07-11 LAB — TROPONIN I: Troponin I: <0.03 ng/mL (ref <0.03)

## 2017-07-11 MED ORDER — ACETAMINOPHEN 325 MG PO TABS
650.0000 mg | ORAL_TABLET | Freq: Once | ORAL | Status: AC
  Administered 2017-07-11: 650 mg via ORAL
  Filled 2017-07-11: qty 2

## 2017-07-11 MED ORDER — AMLODIPINE BESYLATE 5 MG PO TABS
5.0000 mg | ORAL_TABLET | Freq: Once | ORAL | Status: AC
  Administered 2017-07-11: 5 mg via ORAL
  Filled 2017-07-11: qty 1

## 2017-07-11 NOTE — ED Triage Notes (Signed)
States had trigger point injections at MD office about 1330. States after when she tried to get up felt dizzy. States rested there but continues to feel weak.

## 2017-07-11 NOTE — ED Notes (Signed)
Pt reports headache, high blood pressure and near syncopal episode [facility].  No chest pain or sob.  Pt reports taking bp meds earlier today.  Pt also report dizziness.   No n/v/d.  Pt alert.  Speech clear.

## 2017-07-11 NOTE — ED Notes (Signed)
ED Provider at bedside. 

## 2017-07-11 NOTE — ED Provider Notes (Signed)
[facility] Emergency Department Provider Note  ____________________________________________   First MD Initiated Contact with Patient 07/11/17 1721     (approximate)  I have reviewed the triage vital signs and the nursing notes.   HISTORY  Chief Complaint Weakness   HPI Christine Zimmerman is a 72 y.o. female with a history of atrial fibrillation and coronary artery disease on Plavix who is presenting to the emergency department today with lightheadedness after trigger point injections that started at about 1 PM this afternoon.  She went to her neurologist, Dr. Sherryll Burger, for trigger point injections which he said were especially painful today.  She said that after standing up after the injection she began to feel lightheaded with blurred vision.  She says that she also felt a pounding on the sides of her head, bilaterally.  She says that the lightheadedness has since subsided but the pounding is still there.  Her vision still seems blurred but is almost back to normal at this time.  She also reports tingling and numbness to her left upper and lower extremities which is been intermittent over the past weeks to months.  Sometimes she says it lasts for a full day or 2 full days at a time.  She is denying any weakness.  Has a history of TIA as well as recent cardiac catheterization.  The recent cardiac catheterization, 1 month ago, was without any significant stenosis.  Patient says she takes an evening dose of amlodipine that she has not taken yet.  Patient denying any neck pain at this time.  Past Medical History:  Diagnosis Date  . Atrial fibrillation (HCC)   . Chronic pain   . Coronary artery disease   . Diabetes mellitus without complication (HCC)   . GERD (gastroesophageal reflux disease)   . High cholesterol   . Hypertension   . Hypothyroidism     Patient Active Problem List   Diagnosis Date Noted  . TIA (transient ischemic attack) 10/17/2015  . Hyponatremia  10/17/2015  . Anemia 10/17/2015  . Chest pain 10/16/2015  . Left-sided weakness 10/16/2015  . Hypotension 10/16/2015  . Bradycardia 10/16/2015  . Hypoglycemia 10/16/2015    Past Surgical History:  Procedure Laterality Date  . BACK SURGERY    . CARDIAC CATHETERIZATION Left 10/23/2015   Procedure: Left Heart Cath and Coronary Angiography;  Surgeon: Alwyn Pea, MD;  Location: [facility] INVASIVE CV LAB;  Service: Cardiovascular;  Laterality: Left;  . COLONOSCOPY WITH PROPOFOL N/A 01/03/2017   Procedure: COLONOSCOPY WITH PROPOFOL;  Surgeon: Scot Jun, MD;  Location: [facility] ENDOSCOPY;  Service: Endoscopy;  Laterality: N/A;  . CORONARY STENT PLACEMENT     x 4 at [facility]. x 1 by [facility] in 2004  . LEFT HEART CATH AND CORONARY ANGIOGRAPHY N/A 06/08/2017   Procedure: LEFT HEART CATH AND CORONARY ANGIOGRAPHY;  Surgeon: Alwyn Pea, MD;  Location: [facility] INVASIVE CV LAB;  Service: Cardiovascular;  Laterality: N/A;    Prior to Admission medications   Medication Sig Start Date End Date Taking? Authorizing Provider  amLODipine (NORVASC) 5 MG tablet Take 2.5 mg by mouth daily.  06/02/17  Yes [provider]  aspirin EC 81 MG tablet Take 81 mg by mouth daily.   Yes [provider]  atorvastatin (LIPITOR) 80 MG tablet Take 80 mg by mouth daily.   Yes [provider]  Biotin w/ Vitamins C & E (HAIR/SKIN/NAILS PO) Take 1 tablet by mouth daily.   Yes [provider]  Cholecalciferol (VITAMIN D3) 1000 units CAPS Take 1,000 Units by mouth 2 (two) times daily.   Yes [provider]  clopidogrel (PLAVIX) 75 MG tablet Take 75 mg by mouth daily.   Yes [provider]  cyanocobalamin (,VITAMIN B-12,) 1000 MCG/ML injection Inject 1,000 mcg into the muscle every 30 (thirty) days.   Yes [provider]  cyclobenzaprine (FLEXERIL) 10 MG tablet Take 10 mg by mouth 3 (three) times daily as needed for muscle spasms.   Yes [provider]    ferrous sulfate 325 (65 FE) MG tablet Take 325 mg by mouth 2 (two) times daily with a meal.    Yes [provider]  gabapentin (NEURONTIN) 300 MG capsule Take 600 mg by mouth 3 (three) times daily.    Yes [provider]  glipiZIDE (GLUCOTROL XL) 10 MG 24 hr tablet Take 10 mg by mouth 2 (two) times daily.   Yes [provider]  hydrochlorothiazide (HYDRODIURIL) 25 MG tablet Take 25 mg by mouth daily.   Yes [provider]  isosorbide mononitrate (IMDUR) 30 MG 24 hr tablet Take 30 mg by mouth 2 (two) times daily.    Yes [provider]  levothyroxine (SYNTHROID, LEVOTHROID) 50 MCG tablet Take 50 mcg by mouth daily before breakfast.    Yes [provider]  lisinopril (PRINIVIL,ZESTRIL) 20 MG tablet Take 20 mg by mouth daily.   Yes [provider]  metFORMIN (GLUCOPHAGE) 1000 MG tablet Take 1,000 mg by mouth 2 (two) times daily.   Yes [provider]  niacin 500 MG tablet Take 500 mg by mouth 2 (two) times daily.    Yes [provider]  nitroGLYCERIN (NITROLINGUAL) 0.4 MG/SPRAY spray Place 1 spray under the tongue every 5 (five) minutes x 3 doses as needed for chest pain.   Yes [provider]  pantoprazole (PROTONIX) 40 MG tablet Take 40 mg by mouth daily at 6 PM.    Yes [provider]  ranitidine (ZANTAC) 150 MG tablet Take 150 mg by mouth daily.    Yes [provider]  HYDROcodone-acetaminophen (NORCO/VICODIN) 5-325 MG tablet Take 1 tablet by mouth every 4 (four) hours as needed. Patient not taking: Reported on 07/11/2017 12/01/16   Minna Antis, MD    Allergies Biaxin [clarithromycin]; Homatropine; Januvia [sitagliptin]; Macrodantin [nitrofurantoin macrocrystal]; Naproxen; Propoxyphene; Tramadol; and Penicillins  Family History  Problem Relation Age of Onset  . Breast cancer Neg Hx     Social History Social History   Tobacco Use  . Smoking status: Never Smoker  . Smokeless  tobacco: Never Used  Substance Use Topics  . Alcohol use: No  . Drug use: No    Review of Systems  Constitutional: No fever/chills Eyes: As above ENT: No sore throat. Cardiovascular: Denies chest pain. Respiratory: Denies shortness of breath. Gastrointestinal: No abdominal pain.  No nausea, no vomiting.  No diarrhea.  No constipation. Genitourinary: Negative for dysuria. Musculoskeletal: Negative for back pain. Skin: Negative for rash. Neurological: Negative for headaches, focal weakness or numbness.   ____________________________________________   PHYSICAL EXAM:  VITAL SIGNS: ED Triage Vitals [07/11/17 1515]  Enc Vitals Group     BP (!) 153/64     Pulse Rate (!) 51     Resp 20     Temp 98.2 F (36.8 C)     Temp Source Oral     SpO2 99 %     Weight 156 lb (70.8 kg)     Height  5\' 1"  (1.549 m)     Head Circumference      Peak Flow      Pain Score 5     Pain Loc      Pain Edu?      Excl. in GC?     Constitutional: Alert and oriented. Well appearing and in no acute distress. Eyes: Conjunctivae are normal.  Patient able to read a small print sign across the room.  However, she says that she has to concentrate to read the sign that her vision is not completely back to normal. Head: Atraumatic. Nose: No congestion/rhinnorhea. Mouth/Throat: Mucous membranes are moist.  Neck: No stridor.  No tenderness to palpation to the midline cervical spine or the bilateral trapezius muscles. Cardiovascular: Normal rate, regular rhythm. Grossly normal heart sounds.  Good peripheral circulation with equal and bilateral posterior tibial and dorsalis pedis pulses. Respiratory: Normal respiratory effort.  No retractions. Lungs CTAB. Gastrointestinal: Soft and nontender. No distention. No CVA tenderness. Musculoskeletal: No lower extremity tenderness nor edema.  No joint effusions. Neurologic:  Normal speech and language.  Patient states that her sensation to light touch is equal to the  bilateral lower extremities despite feeling a "tingling sensation" to the left foot and leg.  She says that there is slightly decreased sensation to light touch to the left forearm and hand when compared to the right.  5 out of 5 strength throughout. Skin:  Skin is warm, dry and intact. No rash noted. Psychiatric: Mood and affect are normal. Speech and behavior are normal.  ____________________________________________   LABS (all labs ordered are listed, but only abnormal results are displayed)  Labs Reviewed  COMPREHENSIVE METABOLIC PANEL - Abnormal; Notable for the following components:      Result Value   Sodium 132 (*)    Chloride 98 (*)    Glucose, Bld 333 (*)    All other components within normal limits  CBC  TROPONIN I   ____________________________________________  EKG  ED ECG REPORT I, Arelia Longest, the attending physician, personally viewed and interpreted this ECG.   Date: 07/11/2017  EKG Time: 1520  Rate: 51  Rhythm: sinus bradycardia  Axis: Normal  Intervals:right bundle branch block  ST&T Change: No ST segment elevation or depression.  No abnormal T wave inversion. No significant change from previous. ____________________________________________  RADIOLOGY  No acute finding of the CT of the brain. ____________________________________________   PROCEDURES  Procedure(s) performed:   Procedures  Critical Care performed:   ____________________________________________   INITIAL IMPRESSION / ASSESSMENT AND PLAN / ED COURSE  Pertinent labs & imaging results that were available during my care of the patient were reviewed by me and considered in my medical decision making (see chart for details).  Differential diagnosis includes, but is not limited to, intracranial hemorrhage, meningitis/encephalitis, previous head trauma, cavernous venous thrombosis, tension headache, temporal arteritis, migraine or migraine equivalent, idiopathic intracranial  hypertension, and non-specific headache, paresthesia, radiculopathy, blurred vision, stroke, TIA As part of my medical decision making, I reviewed the following data within the electronic MEDICAL RECORD NUMBER Notes from prior office visits  ----------------------------------------- 6:35 PM on 07/11/2017 -----------------------------------------   Patient still without any focal weakness.  Blood pressures improving.  Patient without further complaint.  Also palpated her bilateral temporal artery distribution and there is no nodularity or tenderness.  Story seems to follow the Tylenol near syncopal event after the patient received trigger point injections which she said were very painful today.  No acute finding  on workup today.  Advised patient to follow-up with her neurologist.  Her neurologist also gave her a prescription for meclizine for which she describes ongoing dizziness when she gets up in the mornings.  Patient to be discharged at this time.  She is understanding of the treatment plan willing to comply.  She also denies any urinary frequency or burning.     ____________________________________________   FINAL CLINICAL IMPRESSION(S) / ED DIAGNOSES  Paresthesia.  Near syncope.    NEW MEDICATIONS STARTED DURING THIS VISIT:  New Prescriptions   No medications on file     Note:  This document was prepared using Dragon voice recognition software and may include unintentional dictation errors.     Myrna BlazerSchaevitz, David Matthew, MD 07/11/17 [id number]

## 2017-07-12 ENCOUNTER — Ambulatory Visit: Payer: Medicare Other | Admitting: Physical Therapy

## 2017-07-14 ENCOUNTER — Ambulatory Visit: Payer: Medicare Other | Admitting: Physical Therapy

## 2017-07-19 ENCOUNTER — Ambulatory Visit: Payer: Medicare Other | Admitting: Physical Therapy

## 2017-07-21 ENCOUNTER — Ambulatory Visit: Payer: Medicare Other | Admitting: Physical Therapy

## 2017-07-26 ENCOUNTER — Ambulatory Visit: Payer: Medicare Other | Admitting: Physical Therapy

## 2017-07-28 ENCOUNTER — Ambulatory Visit: Payer: Medicare Other | Admitting: Physical Therapy

## 2017-08-01 ENCOUNTER — Other Ambulatory Visit: Payer: Self-pay

## 2017-08-01 ENCOUNTER — Inpatient Hospital Stay
Admission: EM | Admit: 2017-08-01 | Discharge: 2017-08-03 | DRG: 305 | Disposition: A | Discharge status: Home / Self Care | Payer: Medicare Other | Attending: Internal Medicine | Admitting: Internal Medicine

## 2017-08-01 DIAGNOSIS — Z79899 Other long term (current) drug therapy: Secondary | ICD-10-CM

## 2017-08-01 DIAGNOSIS — Z888 Allergy status to other drugs, medicaments and biological substances status: Secondary | ICD-10-CM

## 2017-08-01 DIAGNOSIS — E7800 Pure hypercholesterolemia, unspecified: Secondary | ICD-10-CM | POA: Diagnosis present

## 2017-08-01 DIAGNOSIS — E1142 Type 2 diabetes mellitus with diabetic polyneuropathy: Secondary | ICD-10-CM | POA: Diagnosis present

## 2017-08-01 DIAGNOSIS — R001 Bradycardia, unspecified: Secondary | ICD-10-CM | POA: Diagnosis present

## 2017-08-01 DIAGNOSIS — Z885 Allergy status to narcotic agent status: Secondary | ICD-10-CM | POA: Diagnosis not present

## 2017-08-01 DIAGNOSIS — F40240 Claustrophobia: Secondary | ICD-10-CM | POA: Diagnosis present

## 2017-08-01 DIAGNOSIS — I2510 Atherosclerotic heart disease of native coronary artery without angina pectoris: Secondary | ICD-10-CM | POA: Diagnosis present

## 2017-08-01 DIAGNOSIS — I480 Paroxysmal atrial fibrillation: Secondary | ICD-10-CM | POA: Diagnosis present

## 2017-08-01 DIAGNOSIS — K219 Gastro-esophageal reflux disease without esophagitis: Secondary | ICD-10-CM | POA: Diagnosis present

## 2017-08-01 DIAGNOSIS — Z7982 Long term (current) use of aspirin: Secondary | ICD-10-CM | POA: Diagnosis not present

## 2017-08-01 DIAGNOSIS — Z23 Encounter for immunization: Secondary | ICD-10-CM

## 2017-08-01 DIAGNOSIS — Z881 Allergy status to other antibiotic agents status: Secondary | ICD-10-CM

## 2017-08-01 DIAGNOSIS — E039 Hypothyroidism, unspecified: Secondary | ICD-10-CM | POA: Diagnosis present

## 2017-08-01 DIAGNOSIS — Z955 Presence of coronary angioplasty implant and graft: Secondary | ICD-10-CM

## 2017-08-01 DIAGNOSIS — I10 Essential (primary) hypertension: Secondary | ICD-10-CM

## 2017-08-01 DIAGNOSIS — Z7984 Long term (current) use of oral hypoglycemic drugs: Secondary | ICD-10-CM | POA: Diagnosis not present

## 2017-08-01 DIAGNOSIS — G8929 Other chronic pain: Secondary | ICD-10-CM | POA: Diagnosis present

## 2017-08-01 DIAGNOSIS — I160 Hypertensive urgency: Secondary | ICD-10-CM | POA: Diagnosis not present

## 2017-08-01 DIAGNOSIS — Z79890 Hormone replacement therapy (postmenopausal): Secondary | ICD-10-CM

## 2017-08-01 DIAGNOSIS — Z886 Allergy status to analgesic agent status: Secondary | ICD-10-CM | POA: Diagnosis not present

## 2017-08-01 DIAGNOSIS — Z880 Allergy status to penicillin: Secondary | ICD-10-CM | POA: Diagnosis not present

## 2017-08-01 DIAGNOSIS — R42 Dizziness and giddiness: Secondary | ICD-10-CM

## 2017-08-01 DIAGNOSIS — Z7902 Long term (current) use of antithrombotics/antiplatelets: Secondary | ICD-10-CM | POA: Diagnosis not present

## 2017-08-01 DIAGNOSIS — E871 Hypo-osmolality and hyponatremia: Secondary | ICD-10-CM | POA: Diagnosis present

## 2017-08-01 LAB — CBC
HCT: 34.4 % — ABNORMAL LOW (ref 35.0–47.0)
HEMOGLOBIN: 12 g/dL (ref 12.0–16.0)
MCH: 30.7 pg (ref 26.0–34.0)
MCHC: 34.9 g/dL (ref 32.0–36.0)
MCV: 88.1 fL (ref 80.0–100.0)
Platelets: 336 10*3/uL (ref 150–440)
RBC: 3.9 MIL/uL (ref 3.80–5.20)
RDW: 13.9 % (ref 11.5–14.5)
WBC: 6.5 10*3/uL (ref 3.6–11.0)

## 2017-08-01 LAB — GLUCOSE, CAPILLARY
GLUCOSE-CAPILLARY: 109 mg/dL — AB (ref 65–99)
Glucose-Capillary: 53 mg/dL — ABNORMAL LOW (ref 65–99)

## 2017-08-01 LAB — BASIC METABOLIC PANEL
ANION GAP: 9 (ref 5–15)
BUN: 8 mg/dL (ref 6–20)
CALCIUM: 8.6 mg/dL — AB (ref 8.9–10.3)
CO2: 23 mmol/L (ref 22–32)
Chloride: 91 mmol/L — ABNORMAL LOW (ref 101–111)
Creatinine, Ser: 0.73 mg/dL (ref 0.44–1.00)
GFR calc non Af Amer: >60 mL/min (ref >60)
GLUCOSE: 82 mg/dL (ref 65–99)
Potassium: 3.7 mmol/L (ref 3.5–5.1)
SODIUM: 123 mmol/L — AB (ref 135–145)

## 2017-08-01 MED ORDER — LISINOPRIL 20 MG PO TABS
20.0000 mg | ORAL_TABLET | Freq: Every day | ORAL | Status: DC
  Administered 2017-08-02 – 2017-08-03 (×2): 20 mg via ORAL
  Filled 2017-08-01 (×2): qty 1

## 2017-08-01 MED ORDER — CYANOCOBALAMIN 1000 MCG/ML IJ SOLN
1000.0000 ug | INTRAMUSCULAR | Status: DC
  Filled 2017-08-01: qty 1

## 2017-08-01 MED ORDER — INSULIN ASPART 100 UNIT/ML ~~LOC~~ SOLN
0.0000 [IU] | Freq: Three times a day (TID) | SUBCUTANEOUS | Status: DC
  Administered 2017-08-02: 7 [IU] via SUBCUTANEOUS
  Administered 2017-08-03: 2 [IU] via SUBCUTANEOUS
  Administered 2017-08-03: 7 [IU] via SUBCUTANEOUS
  Filled 2017-08-01 (×4): qty 1

## 2017-08-01 MED ORDER — ISOSORBIDE MONONITRATE ER 30 MG PO TB24
30.0000 mg | ORAL_TABLET | Freq: Two times a day (BID) | ORAL | Status: DC
  Administered 2017-08-02 – 2017-08-03 (×2): 30 mg via ORAL
  Filled 2017-08-01 (×2): qty 1

## 2017-08-01 MED ORDER — ONDANSETRON HCL 4 MG/2ML IJ SOLN: 4.0000 mg | Freq: Four times a day (QID) | INTRAMUSCULAR | Status: DC | PRN

## 2017-08-01 MED ORDER — FERROUS SULFATE 325 (65 FE) MG PO TABS
325.0000 mg | ORAL_TABLET | Freq: Two times a day (BID) | ORAL | Status: DC
  Administered 2017-08-02 – 2017-08-03 (×2): 325 mg via ORAL
  Filled 2017-08-01 (×3): qty 1

## 2017-08-01 MED ORDER — HYDROCODONE-ACETAMINOPHEN 5-325 MG PO TABS
1.0000 | ORAL_TABLET | ORAL | Status: DC | PRN
  Filled 2017-08-01: qty 1

## 2017-08-01 MED ORDER — PANTOPRAZOLE SODIUM 40 MG PO TBEC
40.0000 mg | DELAYED_RELEASE_TABLET | Freq: Every day | ORAL | Status: DC
  Administered 2017-08-02: 40 mg via ORAL
  Filled 2017-08-01: qty 1

## 2017-08-01 MED ORDER — FAMOTIDINE 20 MG PO TABS
10.0000 mg | ORAL_TABLET | Freq: Every day | ORAL | Status: DC
  Administered 2017-08-03: 10 mg via ORAL
  Filled 2017-08-01: qty 1

## 2017-08-01 MED ORDER — INFLUENZA VAC SPLIT HIGH-DOSE 0.5 ML IM SUSY
0.5000 mL | PREFILLED_SYRINGE | INTRAMUSCULAR | Status: AC
  Administered 2017-08-02: 0.5 mL via INTRAMUSCULAR
  Filled 2017-08-01: qty 0.5

## 2017-08-01 MED ORDER — DOCUSATE SODIUM 100 MG PO CAPS
100.0000 mg | ORAL_CAPSULE | Freq: Two times a day (BID) | ORAL | Status: DC
  Administered 2017-08-02 – 2017-08-03 (×2): 100 mg via ORAL
  Filled 2017-08-01 (×2): qty 1

## 2017-08-01 MED ORDER — LEVOTHYROXINE SODIUM 50 MCG PO TABS
50.0000 ug | ORAL_TABLET | Freq: Every day | ORAL | Status: DC
  Administered 2017-08-02 – 2017-08-03 (×2): 50 ug via ORAL
  Filled 2017-08-01 (×2): qty 1

## 2017-08-01 MED ORDER — ASPIRIN EC 81 MG PO TBEC
81.0000 mg | DELAYED_RELEASE_TABLET | Freq: Every day | ORAL | Status: DC
  Administered 2017-08-02 – 2017-08-03 (×2): 81 mg via ORAL
  Filled 2017-08-01 (×2): qty 1

## 2017-08-01 MED ORDER — ATORVASTATIN CALCIUM 20 MG PO TABS
80.0000 mg | ORAL_TABLET | Freq: Every day | ORAL | Status: DC
  Filled 2017-08-01 (×2): qty 4

## 2017-08-01 MED ORDER — ONDANSETRON HCL 4 MG PO TABS: 4.0000 mg | ORAL_TABLET | Freq: Four times a day (QID) | ORAL | Status: DC | PRN

## 2017-08-01 MED ORDER — TRAZODONE HCL 50 MG PO TABS: 25.0000 mg | ORAL_TABLET | Freq: Every evening | ORAL | Status: DC | PRN

## 2017-08-01 MED ORDER — PNEUMOCOCCAL VAC POLYVALENT 25 MCG/0.5ML IJ INJ
0.5000 mL | INJECTION | INTRAMUSCULAR | Status: AC
  Administered 2017-08-02: 0.5 mL via INTRAMUSCULAR
  Filled 2017-08-01: qty 0.5

## 2017-08-01 MED ORDER — BISACODYL 5 MG PO TBEC: 5.0000 mg | DELAYED_RELEASE_TABLET | Freq: Every day | ORAL | Status: DC | PRN

## 2017-08-01 MED ORDER — VITAMIN D 1000 UNITS PO TABS
1000.0000 [IU] | ORAL_TABLET | Freq: Two times a day (BID) | ORAL | Status: DC
  Administered 2017-08-02 – 2017-08-03 (×3): 1000 [IU] via ORAL
  Filled 2017-08-01 (×3): qty 1

## 2017-08-01 MED ORDER — HEPARIN SODIUM (PORCINE) 5000 UNIT/ML IJ SOLN
5000.0000 [IU] | Freq: Three times a day (TID) | INTRAMUSCULAR | Status: DC
  Administered 2017-08-02 – 2017-08-03 (×3): 5000 [IU] via SUBCUTANEOUS
  Filled 2017-08-01 (×3): qty 1

## 2017-08-01 MED ORDER — INSULIN ASPART 100 UNIT/ML ~~LOC~~ SOLN
0.0000 [IU] | Freq: Every day | SUBCUTANEOUS | Status: DC
  Administered 2017-08-02: 2 [IU] via SUBCUTANEOUS
  Filled 2017-08-01: qty 1

## 2017-08-01 MED ORDER — CLOPIDOGREL BISULFATE 75 MG PO TABS
75.0000 mg | ORAL_TABLET | Freq: Every day | ORAL | Status: DC
  Administered 2017-08-02 – 2017-08-03 (×2): 75 mg via ORAL
  Filled 2017-08-01 (×2): qty 1

## 2017-08-01 MED ORDER — AMLODIPINE BESYLATE 5 MG PO TABS
2.5000 mg | ORAL_TABLET | Freq: Every day | ORAL | Status: DC
  Administered 2017-08-02: 2.5 mg via ORAL
  Filled 2017-08-01: qty 1

## 2017-08-01 MED ORDER — SODIUM CHLORIDE 0.9 % IV BOLUS (SEPSIS)
1000.0000 mL | Freq: Once | INTRAVENOUS | Status: AC
  Administered 2017-08-01: 1000 mL via INTRAVENOUS

## 2017-08-01 MED ORDER — ACETAMINOPHEN 325 MG PO TABS
650.0000 mg | ORAL_TABLET | Freq: Four times a day (QID) | ORAL | Status: DC | PRN
  Administered 2017-08-02 (×2): 650 mg via ORAL
  Filled 2017-08-01 (×2): qty 2

## 2017-08-01 MED ORDER — NITROGLYCERIN 0.4 MG SL SUBL
0.4000 mg | SUBLINGUAL_TABLET | SUBLINGUAL | Status: DC | PRN
  Filled 2017-08-01: qty 1

## 2017-08-01 MED ORDER — ACETAMINOPHEN 650 MG RE SUPP: 650.0000 mg | Freq: Four times a day (QID) | RECTAL | Status: DC | PRN

## 2017-08-01 MED ORDER — NITROGLYCERIN 0.4 MG/SPRAY TL SOLN: 1.0000 | Status: DC | PRN

## 2017-08-01 MED ORDER — NIACIN 500 MG PO TABS
500.0000 mg | ORAL_TABLET | Freq: Two times a day (BID) | ORAL | Status: DC
  Administered 2017-08-02 – 2017-08-03 (×3): 500 mg via ORAL
  Filled 2017-08-01 (×5): qty 1

## 2017-08-01 MED ORDER — HAIR/SKIN/NAILS PO TABS: ORAL_TABLET | Freq: Every day | ORAL | Status: DC

## 2017-08-01 MED ORDER — METFORMIN HCL 500 MG PO TABS
1000.0000 mg | ORAL_TABLET | Freq: Two times a day (BID) | ORAL | Status: DC
  Administered 2017-08-02: 1000 mg via ORAL
  Filled 2017-08-01: qty 2

## 2017-08-01 NOTE — Progress Notes (Signed)
PHARMACIST - PHYSICIAN ORDER COMMUNICATION  CONCERNING: P&T Medication Policy on Herbal Medications  DESCRIPTION:  This patient's order for:  Hair/Skin/Nails tablet  has been noted.  This product(s) is classified as an "herbal" or natural product. Due to a lack of definitive safety studies or FDA approval, nonstandard manufacturing practices, plus the potential risk of unknown drug-drug interactions while on inpatient medications, the Pharmacy and Therapeutics Committee does not permit the use of "herbal" or natural products of this type within [facility].   ACTION TAKEN: The pharmacy department is unable to verify this order at this time. Please reevaluate patient's clinical condition at discharge and address if the herbal or natural product(s) should be resumed at that time.

## 2017-08-01 NOTE — H&P (Signed)
[facility] - [place] at [facility]   PATIENT NAME: Christine Zimmerman    MR#:  [id number]  DATE OF BIRTH:  May 22, 1945  DATE OF ADMISSION:  08/01/2017  PRIMARY CARE PHYSICIAN: Patrice Paradise, MD   REQUESTING/REFERRING PHYSICIAN:   CHIEF COMPLAINT:   Chief Complaint  Patient presents with  . Hypertension    HISTORY OF PRESENT ILLNESS: Christine Zimmerman  is a 72 y.o. female with a known history of hypertension, coronary artery disease, diabetes type 2, peripheral neuropathy, chronic pain and paroxysmal atrial fibrillation. Patient presented to emergency room for elevated blood pressure associated with dizziness, headaches and blurry vision.  The dizziness is described as lightheadedness, worse when standing up and walking.  Patient also complains of intermittent numbness and tingling on the left side of her body going on for the past 24 hours.  Her systolic blood pressure at home was greater than 200.  No chest pain, fever, chills, no nausea, vomiting, diarrhea. Patient has been working with neurology for similar symptoms in the past few months.  There has been some changes in her medications in the past 2 months.  She tried meclizine and Tylenol at home without any relief.  Blood present emergency room are remarkable for low sodium level of 123.  UA is negative for UTI. EKG reviewed by myself, is noted without any acute changes. Patient is admitted for further evaluation and treatment.  PAST MEDICAL HISTORY:   Past Medical History:  Diagnosis Date  . Atrial fibrillation (HCC)   . Chronic pain   . Coronary artery disease   . Diabetes mellitus without complication (HCC)   . GERD (gastroesophageal reflux disease)   . High cholesterol   . Hypertension   . Hypothyroidism     PAST SURGICAL HISTORY:  Past Surgical History:  Procedure Laterality Date  . BACK SURGERY    . CARDIAC CATHETERIZATION Left 10/23/2015   Procedure: Left Heart Cath and Coronary  Angiography;  Surgeon: Alwyn Pea, MD;  Location: [facility] INVASIVE CV LAB;  Service: Cardiovascular;  Laterality: Left;  . COLONOSCOPY WITH PROPOFOL N/A 01/03/2017   Procedure: COLONOSCOPY WITH PROPOFOL;  Surgeon: Scot Jun, MD;  Location: [facility] ENDOSCOPY;  Service: Endoscopy;  Laterality: N/A;  . CORONARY STENT PLACEMENT     x 4 at [facility]. x 1 by [facility] in 2004  . LEFT HEART CATH AND CORONARY ANGIOGRAPHY N/A 06/08/2017   Procedure: LEFT HEART CATH AND CORONARY ANGIOGRAPHY;  Surgeon: Alwyn Pea, MD;  Location: [facility] INVASIVE CV LAB;  Service: Cardiovascular;  Laterality: N/A;    SOCIAL HISTORY:  Social History   Tobacco Use  . Smoking status: Never Smoker  . Smokeless tobacco: Never Used  Substance Use Topics  . Alcohol use: No    FAMILY HISTORY:  Family History  Problem Relation Age of Onset  . Breast cancer Neg Hx     DRUG ALLERGIES:  Allergies  Allergen Reactions  . Biaxin [Clarithromycin] Hives  . Homatropine Other (See Comments)    Hycodan  . Januvia [Sitagliptin] Other (See Comments)    Unknown  . Macrodantin [Nitrofurantoin Macrocrystal] Other (See Comments)    Unknown  . Naproxen Other (See Comments)    Unknown  . Propoxyphene Other (See Comments)    Darvocet  . Tramadol Other (See Comments)    Unknown  . Penicillins Rash and Other (See Comments)    Has patient had a PCN reaction causing immediate rash, facial/tongue/throat swelling, SOB or lightheadedness with hypotension:  No Has patient had a PCN reaction causing severe rash involving mucus membranes or skin necrosis: No Has patient had a PCN reaction that required hospitalization: Yes Has patient had a PCN reaction occurring within the last 10 years: Unknown If all of the above answers are "NO", then may proceed with Cephalosporin use.     REVIEW OF SYSTEMS:   CONSTITUTIONAL: No fever, positive for fatigue generalized weakness.  EYES: Positive for blurred vision.  EARS, NOSE, AND  THROAT: No tinnitus or ear pain.  RESPIRATORY: No cough, shortness of breath, wheezing or hemoptysis.  CARDIOVASCULAR: Positive for lightheadedness.  No chest pain, orthopnea, edema.  GASTROINTESTINAL: No nausea, vomiting, diarrhea or abdominal pain.  GENITOURINARY: No dysuria, hematuria.  ENDOCRINE: No polyuria, nocturia. HEMATOLOGY: No bleeding SKIN: No rash or lesion. MUSCULOSKELETAL: No joint pain or arthritis.   NEUROLOGIC: Positive for headache and left-side of the body tingling and numbness.  No focal weakness.  PSYCHIATRY: No anxiety or depression.   MEDICATIONS AT HOME:  Prior to Admission medications   Medication Sig Start Date End Date Taking? Authorizing Provider  amLODipine (NORVASC) 5 MG tablet Take 2.5 mg by mouth daily.  06/02/17   [provider]  aspirin EC 81 MG tablet Take 81 mg by mouth daily.    [provider]  atorvastatin (LIPITOR) 80 MG tablet Take 80 mg by mouth daily.    [provider]  Biotin w/ Vitamins C & E (HAIR/SKIN/NAILS PO) Take 1 tablet by mouth daily.    [provider]  Cholecalciferol (VITAMIN D3) 1000 units CAPS Take 1,000 Units by mouth 2 (two) times daily.    [provider]  clopidogrel (PLAVIX) 75 MG tablet Take 75 mg by mouth daily.    [provider]  cyanocobalamin (,VITAMIN B-12,) 1000 MCG/ML injection Inject 1,000 mcg into the muscle every 30 (thirty) days.    [provider]  cyclobenzaprine (FLEXERIL) 10 MG tablet Take 10 mg by mouth 3 (three) times daily as needed for muscle spasms.    [provider]  ferrous sulfate 325 (65 FE) MG tablet Take 325 mg by mouth 2 (two) times daily with a meal.     [provider]  gabapentin (NEURONTIN) 300 MG capsule Take 600 mg by mouth 3 (three) times daily.     [provider]  glipiZIDE (GLUCOTROL XL) 10 MG 24 hr tablet Take 10 mg by mouth 2 (two) times daily.    [provider]  hydrochlorothiazide  (HYDRODIURIL) 25 MG tablet Take 25 mg by mouth daily.    [provider]  HYDROcodone-acetaminophen (NORCO/VICODIN) 5-325 MG tablet Take 1 tablet by mouth every 4 (four) hours as needed. Patient not taking: Reported on 07/11/2017 12/01/16   Minna AntisPaduchowski, Kevin, MD  isosorbide mononitrate (IMDUR) 30 MG 24 hr tablet Take 30 mg by mouth 2 (two) times daily.     [provider]  levothyroxine (SYNTHROID, LEVOTHROID) 50 MCG tablet Take 50 mcg by mouth daily before breakfast.     [provider]  lisinopril (PRINIVIL,ZESTRIL) 20 MG tablet Take 20 mg by mouth daily.    [provider]  metFORMIN (GLUCOPHAGE) 1000 MG tablet Take 1,000 mg by mouth 2 (two) times daily.    [provider]  niacin 500 MG tablet Take 500 mg by mouth 2 (two) times daily.     [provider]  nitroGLYCERIN (NITROLINGUAL) 0.4 MG/SPRAY spray Place 1 spray under the tongue every 5 (five) minutes x 3 doses as  needed for chest pain.    [provider]  pantoprazole (PROTONIX) 40 MG tablet Take 40 mg by mouth daily at 6 PM.     [provider]  ranitidine (ZANTAC) 150 MG tablet Take 150 mg by mouth daily.     [provider]      PHYSICAL EXAMINATION:   VITAL SIGNS: Blood pressure (!) 194/60, temperature 97.9 F (36.6 C), temperature source Oral, resp. rate 18, height 5\' 1"  (1.549 m), weight 70.3 kg (155 lb), SpO2 100 %.  GENERAL:  72 y.o.-year-old patient lying in the bed with no acute distress, feeling better after blood pressure medication in the emergency room.  EYES: Pupils equal, round, reactive to light and accommodation. No scleral icterus. Extraocular muscles intact.  HEENT: Head atraumatic, normocephalic. Oropharynx and nasopharynx clear.  NECK:  Supple, no jugular venous distention. No thyroid enlargement, no tenderness.  LUNGS: Normal breath sounds bilaterally, no wheezing, rales,rhonchi or crepitation. No use of accessory muscles of  respiration.  CARDIOVASCULAR: S1, S2 normal. No S3/S4.  ABDOMEN: Soft, nontender, nondistended. Bowel sounds present. No organomegaly or mass.  EXTREMITIES: No pedal edema, cyanosis, or clubbing.  NEUROLOGIC: Cranial nerves II through XII are intact. Muscle strength 5/5 in all extremities.  Slightly reduced sensation on the left side of the body. Gait not checked, due to dizziness when standing.  PSYCHIATRIC: The patient is alert and oriented x 3.  SKIN: No obvious rash, lesion, or ulcer.   LABORATORY PANEL:   CBC Recent Labs  Lab 08/01/17 1823  WBC 6.5  HGB 12.0  HCT 34.4*  PLT 336  MCV 88.1  MCH 30.7  MCHC 34.9  RDW 13.9   ------------------------------------------------------------------------------------------------------------------  Chemistries  Recent Labs  Lab 08/01/17 1823  NA 123*  K 3.7  CL 91*  CO2 23  GLUCOSE 82  BUN 8  CREATININE 0.73  CALCIUM 8.6*   ------------------------------------------------------------------------------------------------------------------ estimated creatinine clearance is 59.5 mL/min (by C-G formula based on SCr of 0.73 mg/dL). ------------------------------------------------------------------------------------------------------------------ No results for input(s): TSH, T4TOTAL, T3FREE, THYROIDAB in the last 72 hours.  Invalid input(s): FREET3   Coagulation profile No results for input(s): INR, PROTIME in the last 168 hours. ------------------------------------------------------------------------------------------------------------------- No results for input(s): DDIMER in the last 72 hours. -------------------------------------------------------------------------------------------------------------------  Cardiac Enzymes No results for input(s): CKMB, TROPONINI, MYOGLOBIN in the last 168 hours.  Invalid input(s):  CK ------------------------------------------------------------------------------------------------------------------ Invalid input(s): POCBNP  ---------------------------------------------------------------------------------------------------------------  Urinalysis    Component Value Date/Time   COLORURINE STRAW (A) 10/16/2015 0351   APPEARANCEUR CLEAR (A) 10/16/2015 0351   APPEARANCEUR Clear 08/16/2014 0407   LABSPEC 1.004 (L) 10/16/2015 0351   LABSPEC 1.004 08/16/2014 0407   PHURINE 6.0 10/16/2015 0351   GLUCOSEU NEGATIVE 10/16/2015 0351   GLUCOSEU Negative 08/16/2014 0407   HGBUR 1+ (A) 10/16/2015 0351   BILIRUBINUR NEGATIVE 10/16/2015 0351   BILIRUBINUR Negative 08/16/2014 0407   KETONESUR NEGATIVE 10/16/2015 0351   PROTEINUR NEGATIVE 10/16/2015 0351   NITRITE NEGATIVE 10/16/2015 0351   LEUKOCYTESUR NEGATIVE 10/16/2015 0351   LEUKOCYTESUR Negative 08/16/2014 0407     RADIOLOGY: No results found.  EKG: Orders placed or performed during the hospital encounter of 08/01/17  . ED EKG  . ED EKG  . EKG 12-Lead  . EKG 12-Lead    IMPRESSION AND PLAN:   1.  Hypertensive urgency.  We will continue blood pressure medications with a goal of lowering the blood pressure slowly in the next 2 day, towards the normal limits. 2.  Lightheadedness, associated with headache, blurry  vision and left-sided body numbness and tingling are likely related to elevated blood pressure, hyponatremia and possibly side effects from medications.  Will rule out intracranial process, will check brain MRI.  3.  Acute hyponatremia, sodium level is 123, from 132, three weeks ago.  Will continue hydrochlorothiazide and start gentle IV hydration.  Continue to monitor sodium level closely. 4.  Diabetes type 2, fairly well controlled.  Low-carb diet.  We will check blood sugars before meals and at bedtime and use insulin treatment during the hospital stay. 3.  Coronary artery disease, stable, continue medical  management.   All the records are reviewed and case discussed with ED provider. Management plans discussed with the patient, family and they are in agreement.  CODE STATUS: Code Status History    Date Active Date Inactive Code Status Order ID Comments User Context   06/08/2017 13:39 06/08/2017 19:34 Full Code [id number]  Alwyn Pea, MD Inpatient   10/23/2015 09:27 10/23/2015 13:39 Full Code [id number]  Alwyn Pea, MD Inpatient   10/16/2015 12:01 10/17/2015 21:36 Full Code [id number]  Katharina Caper, MD Inpatient       TOTAL TIME TAKING CARE OF THIS PATIENT: 45 minutes.    Cammy Copa M.D on 08/01/2017 at 9:26 PM  Between 7am to 6pm - Pager - [phone]  After 6pm go to [email or web address] - password [email or web address] [facility]  [place]  [facility]  Office  [phone]  CC: Primary care physician; Patrice Paradise, MD

## 2017-08-01 NOTE — ED Provider Notes (Signed)
[facility] Emergency Department Provider Note   ____________________________________________   I have reviewed the triage vital signs and the nursing notes.   HISTORY  Chief Complaint Hypertension   History limited by: Not Limited   HPI Christine Zimmerman is a 72 y.o. female who presents to the emergency department today with primary concern for high blood pressure.  The patient states that it has spiked recently.  She had blood pressure with systolics greater than 200. She has been having headaches and some blurry vision.  Today she had been feeling some associated dizziness and weakness.  She also describes some numbness and tingling on the left side of her body. She tried both tylenol and meclizine today without any significant relief.  The patient apparently has been having some changes in her blood pressure medication stating that she had not been taking 1 of her blood pressure medications for roughly 2 weeks.  Patient denies any chest pain. Patient denies any fevers, nausea vomiting or diarrhea.  Per medical record review patient has a history of CAD, DM, HTN.  Past Medical History:  Diagnosis Date  . Atrial fibrillation (HCC)   . Chronic pain   . Coronary artery disease   . Diabetes mellitus without complication (HCC)   . GERD (gastroesophageal reflux disease)   . High cholesterol   . Hypertension   . Hypothyroidism     Patient Active Problem List   Diagnosis Date Noted  . TIA (transient ischemic attack) 10/17/2015  . Hyponatremia 10/17/2015  . Anemia 10/17/2015  . Chest pain 10/16/2015  . Left-sided weakness 10/16/2015  . Hypotension 10/16/2015  . Bradycardia 10/16/2015  . Hypoglycemia 10/16/2015    Past Surgical History:  Procedure Laterality Date  . BACK SURGERY    . CARDIAC CATHETERIZATION Left 10/23/2015   Procedure: Left Heart Cath and Coronary Angiography;  Surgeon: Alwyn Pea, MD;  Location: [facility] INVASIVE CV LAB;  Service:  Cardiovascular;  Laterality: Left;  . COLONOSCOPY WITH PROPOFOL N/A 01/03/2017   Procedure: COLONOSCOPY WITH PROPOFOL;  Surgeon: Scot Jun, MD;  Location: [facility] ENDOSCOPY;  Service: Endoscopy;  Laterality: N/A;  . CORONARY STENT PLACEMENT     x 4 at [facility]. x 1 by [facility] in 2004  . LEFT HEART CATH AND CORONARY ANGIOGRAPHY N/A 06/08/2017   Procedure: LEFT HEART CATH AND CORONARY ANGIOGRAPHY;  Surgeon: Alwyn Pea, MD;  Location: [facility] INVASIVE CV LAB;  Service: Cardiovascular;  Laterality: N/A;    Prior to Admission medications   Medication Sig Start Date End Date Taking? Authorizing Provider  amLODipine (NORVASC) 5 MG tablet Take 2.5 mg by mouth daily.  06/02/17   [provider]  aspirin EC 81 MG tablet Take 81 mg by mouth daily.    [provider]  atorvastatin (LIPITOR) 80 MG tablet Take 80 mg by mouth daily.    [provider]  Biotin w/ Vitamins C & E (HAIR/SKIN/NAILS PO) Take 1 tablet by mouth daily.    [provider]  Cholecalciferol (VITAMIN D3) 1000 units CAPS Take 1,000 Units by mouth 2 (two) times daily.    [provider]  clopidogrel (PLAVIX) 75 MG tablet Take 75 mg by mouth daily.    [provider]  cyanocobalamin (,VITAMIN B-12,) 1000 MCG/ML injection Inject 1,000 mcg into the muscle every 30 (thirty) days.    [provider]  cyclobenzaprine (FLEXERIL) 10 MG tablet Take 10 mg by mouth 3 (three) times daily as needed for muscle spasms.  [provider]  ferrous sulfate 325 (65 FE) MG tablet Take 325 mg by mouth 2 (two) times daily with a meal.     [provider]  gabapentin (NEURONTIN) 300 MG capsule Take 600 mg by mouth 3 (three) times daily.     [provider]  glipiZIDE (GLUCOTROL XL) 10 MG 24 hr tablet Take 10 mg by mouth 2 (two) times daily.    [provider]  hydrochlorothiazide (HYDRODIURIL) 25 MG tablet Take 25 mg by mouth daily.    [provider]  HYDROcodone-acetaminophen (NORCO/VICODIN) 5-325 MG tablet Take 1 tablet by mouth every 4 (four) hours as needed. Patient not taking: Reported on 07/11/2017 12/01/16   Minna Antis, MD  isosorbide mononitrate (IMDUR) 30 MG 24 hr tablet Take 30 mg by mouth 2 (two) times daily.     [provider]  levothyroxine (SYNTHROID, LEVOTHROID) 50 MCG tablet Take 50 mcg by mouth daily before breakfast.     [provider]  lisinopril (PRINIVIL,ZESTRIL) 20 MG tablet Take 20 mg by mouth daily.    [provider]  metFORMIN (GLUCOPHAGE) 1000 MG tablet Take 1,000 mg by mouth 2 (two) times daily.    [provider]  niacin 500 MG tablet Take 500 mg by mouth 2 (two) times daily.     [provider]  nitroGLYCERIN (NITROLINGUAL) 0.4 MG/SPRAY spray Place 1 spray under the tongue every 5 (five) minutes x 3 doses as needed for chest pain.    [provider]  pantoprazole (PROTONIX) 40 MG tablet Take 40 mg by mouth daily at 6 PM.     [provider]  ranitidine (ZANTAC) 150 MG tablet Take 150 mg by mouth daily.     [provider]    Allergies Biaxin [clarithromycin]; Homatropine; Januvia [sitagliptin]; Macrodantin [nitrofurantoin macrocrystal]; Naproxen; Propoxyphene; Tramadol; and Penicillins  Family History  Problem Relation Age of Onset  . Breast cancer Neg Hx     Social History Social History   Tobacco Use  . Smoking status: Never Smoker  . Smokeless tobacco: Never Used  Substance Use Topics  . Alcohol use: No  . Drug use: No    Review of Systems Constitutional: No fever/chills Eyes: Positive for blurry vision. ENT: No sore throat. Cardiovascular: Denies chest pain. Respiratory: Denies shortness of breath. Gastrointestinal: No abdominal pain.  No nausea, no vomiting.  No diarrhea.   Genitourinary: Negative for dysuria. Musculoskeletal: Negative for back pain. Skin: Negative for rash. Neurological: Positive for  headache.  ____________________________________________   PHYSICAL EXAM:  VITAL SIGNS: ED Triage Vitals  Enc Vitals Group     BP 08/01/17 1821 (!) 194/60     Pulse --      Resp 08/01/17 1819 18     Temp 08/01/17 1819 97.9 F (36.6 C)     Temp Source 08/01/17 1819 Oral     SpO2 08/01/17 1819 100 %     Weight 08/01/17 1821 155 lb (70.3 kg)     Height 08/01/17 1820 5\' 1"  (1.549 m)     Head Circumference --      Peak Flow --      Pain Score 08/01/17 1820 7   Constitutional: Alert and oriented. Well appearing and in no distress. Eyes: Conjunctivae are normal.  ENT   Head: Normocephalic and atraumatic.   Nose: No congestion/rhinnorhea.   Mouth/Throat: Mucous membranes are moist.   Neck: No stridor. Hematological/Lymphatic/Immunilogical: No cervical lymphadenopathy. Cardiovascular: Bradycardic, regular rhythm.  No murmurs, rubs,  or gallops.  Respiratory: Normal respiratory effort without tachypnea nor retractions. Breath sounds are clear and equal bilaterally. No wheezes/rales/rhonchi. Gastrointestinal: Soft and non tender. No rebound. No guarding.  Genitourinary: Deferred Musculoskeletal: Normal range of motion in all extremities. No lower extremity edema. Neurologic:  Normal speech and language. No gross focal neurologic deficits are appreciated.  Skin:  Skin is warm, dry and intact. No rash noted. Psychiatric: Mood and affect are normal. Speech and behavior are normal. Patient exhibits appropriate insight and judgment.  ____________________________________________    LABS (pertinent positives/negatives)  CBC wbc 6.5, hgb 12.0, plt 336 BMP na 123, k 3.7, cr 0.73, ca 8.6 ____________________________________________   EKG  I, Phineas SemenGraydon Madge Therrien, attending physician, personally viewed and interpreted this EKG  EKG Time: 1823 Rate: 49 Rhythm: sinus bradycardia Axis: normal Intervals: qtc 419 QRS: RBBB ST changes: no st elevation Impression: abnormal  ekg   ____________________________________________    RADIOLOGY  None  ____________________________________________   PROCEDURES  Procedures  ____________________________________________   INITIAL IMPRESSION / ASSESSMENT AND PLAN / ED COURSE  Pertinent labs & imaging results that were available during my care of the patient were reviewed by me and considered in my medical decision making (see chart for details).  Patient presented to the emergency department today with main complaint of elevated blood pressure.  She had secondary complaints of headache, blurry vision, weakness and dizziness.  While patient's blood pressure was elevated here in the emergency department she was also found to be significantly hyponatremic.  At this point I do think the hyponatremia is roughly more the etiology of the patient's symptoms.  Will start patient on IV fluids and plan on admission to the hospital service.  Discussed findings and plan with patient.   ____________________________________________   FINAL CLINICAL IMPRESSION(S) / ED DIAGNOSES  Final diagnoses:  Hypertension, unspecified type  Dizziness  Hyponatremia     Note: This dictation was prepared with Dragon dictation. Any transcriptional errors that result from this process are unintentional     Phineas SemenGoodman, Jabreel Chimento, MD 08/01/17 2312

## 2017-08-01 NOTE — ED Notes (Signed)
FN: [facility] with high blood pressure, dizziness.

## 2017-08-01 NOTE — ED Notes (Signed)
Pt assisted to restroom. C/o dizziness upon standing. Gait steady. no distress noted. Asking when she will go back to a room. Provided for pt safety and comfort and will continue to assess.

## 2017-08-01 NOTE — ED Notes (Signed)
Pt reports having high BP and dizziness. Pt is alert and oriented x 4. Dr. Derrill KayGoodman at bedside.

## 2017-08-01 NOTE — ED Triage Notes (Signed)
Pt c/o b/p >200systolic today and is having HA with blurred vision.

## 2017-08-02 ENCOUNTER — Ambulatory Visit: Payer: Medicare Other | Admitting: Physical Therapy

## 2017-08-02 ENCOUNTER — Inpatient Hospital Stay: Payer: Medicare Other

## 2017-08-02 LAB — CBC
HEMATOCRIT: 34.6 % — AB (ref 35.0–47.0)
HEMOGLOBIN: 11.6 g/dL — AB (ref 12.0–16.0)
MCH: 29.9 pg (ref 26.0–34.0)
MCHC: 33.5 g/dL (ref 32.0–36.0)
MCV: 89.4 fL (ref 80.0–100.0)
Platelets: 318 10*3/uL (ref 150–440)
RBC: 3.88 MIL/uL (ref 3.80–5.20)
RDW: 14.2 % (ref 11.5–14.5)
WBC: 4.6 10*3/uL (ref 3.6–11.0)

## 2017-08-02 LAB — BASIC METABOLIC PANEL
Anion gap: 2 — ABNORMAL LOW (ref 5–15)
BUN: 8 mg/dL (ref 6–20)
CO2: 23 mmol/L (ref 22–32)
Calcium: 8.4 mg/dL — ABNORMAL LOW (ref 8.9–10.3)
Chloride: 104 mmol/L (ref 101–111)
Creatinine, Ser: 0.76 mg/dL (ref 0.44–1.00)
GFR calc Af Amer: >60 mL/min (ref >60)
GFR calc non Af Amer: >60 mL/min (ref >60)
GLUCOSE: 147 mg/dL — AB (ref 65–99)
Potassium: 4.2 mmol/L (ref 3.5–5.1)
Sodium: 129 mmol/L — ABNORMAL LOW (ref 135–145)

## 2017-08-02 LAB — TSH: TSH: 6.057 u[IU]/mL — AB (ref 0.350–4.500)

## 2017-08-02 LAB — GLUCOSE, CAPILLARY
GLUCOSE-CAPILLARY: 334 mg/dL — AB (ref 65–99)
GLUCOSE-CAPILLARY: 89 mg/dL (ref 65–99)
Glucose-Capillary: 123 mg/dL — ABNORMAL HIGH (ref 65–99)
Glucose-Capillary: 247 mg/dL — ABNORMAL HIGH (ref 65–99)

## 2017-08-02 LAB — TROPONIN I: Troponin I: <0.03 ng/mL (ref <0.03)

## 2017-08-02 LAB — CKMB (ARMC ONLY): CK, MB: 2.4 ng/mL (ref 0.5–5.0)

## 2017-08-02 MED ORDER — AMLODIPINE BESYLATE 5 MG PO TABS
5.0000 mg | ORAL_TABLET | Freq: Every day | ORAL | Status: DC
  Filled 2017-08-02: qty 1

## 2017-08-02 MED ORDER — DULOXETINE HCL 20 MG PO CPEP
20.0000 mg | ORAL_CAPSULE | Freq: Every day | ORAL | Status: DC
  Administered 2017-08-02: 20 mg via ORAL
  Filled 2017-08-02 (×2): qty 1

## 2017-08-02 MED ORDER — GABAPENTIN 600 MG PO TABS
600.0000 mg | ORAL_TABLET | Freq: Three times a day (TID) | ORAL | Status: DC
  Administered 2017-08-02 – 2017-08-03 (×3): 600 mg via ORAL
  Filled 2017-08-02 (×3): qty 1

## 2017-08-02 NOTE — Progress Notes (Signed)
MRI tech has notified RN that pt refused to go through MRI machine, due to not being able to breath. Pt refused antianxiety medications that could be given to pt to help her relax through the machine. She stated she "did not want to go through machine and that she had a bad experience down there." 2C assistance director was notified of concern. RN will continue to monitor pt.   Blaize Epple [place]

## 2017-08-02 NOTE — Progress Notes (Signed)
Pt complaining of chest pain/pressure of 3/10 this AM. MD order to give Nitro but PT refused noting that she had Aspirin in her bag and could take that instead. Pt informed that that is not the course of action the MD wanted to take. Pt noted that the Nitro gives her a headache and makes her sleep. Pt. Also refused to go for the MRI scheduled for 01:52 this AM and Tech from MRI dept noted that they would get her later as routine.

## 2017-08-02 NOTE — Progress Notes (Signed)
Introductory visit. Chaplain offered spiritual support. The chaplain asked the patient to have the nurse page the on-call chaplain if emotional or spiritual support is desired.

## 2017-08-02 NOTE — Consult Note (Signed)
Cardiology Consultation Note    Patient ID: Christine BestShobhna J Pyon, MRN: [id number], DOB/AGE: 70/05/1945 70 y.o. Admit date: 08/01/2017   Date of Consult: 08/02/2017 Primary Physician: Patrice ParadiseMcLaughlin, Miriam K, MD Primary Cardiologist: Dr. Juliann Paresallwood  Chief Complaint: dizziness Reason for Consultation: dizziness/afib Requesting MD: Dr. Elisabeth PigeonVachhani  HPI: Christine Zimmerman is a 72 y.o. female with history of coronary artery disease s/p pci at [facility] with relook cath in 1/19 which revealed normal lv function, less than 50% stenosis in all vessels. Medical management was recommended. Pt has been treated with asa, plavix, atorvastatin 80 mg daily, amlodipine 2.5 mg daily, lisinopril. She states she developed dizziness and increased blood pressure. Her physicians told her to go to acute care who sent her to the er. She has ruled out for an mi. Her blood pressure remains somewhat elevated. She states her chest pain has resolved and her dizziness is improved. She was not able to tolerate brain mri this am due to claustrophobia. She remains in nsr with PACs. No afib noted. No change in her ekg from baseline. No prolonged pauses.   Past Medical History:  Diagnosis Date  . Atrial fibrillation (HCC)   . Chronic pain   . Coronary artery disease   . Diabetes mellitus without complication (HCC)   . GERD (gastroesophageal reflux disease)   . High cholesterol   . Hypertension   . Hypothyroidism       Surgical History:  Past Surgical History:  Procedure Laterality Date  . BACK SURGERY    . CARDIAC CATHETERIZATION Left 10/23/2015   Procedure: Left Heart Cath and Coronary Angiography;  Surgeon: Alwyn Peawayne D Callwood, MD;  Location: [facility] INVASIVE CV LAB;  Service: Cardiovascular;  Laterality: Left;  . COLONOSCOPY WITH PROPOFOL N/A 01/03/2017   Procedure: COLONOSCOPY WITH PROPOFOL;  Surgeon: Scot JunElliott, Robert T, MD;  [facility];  Service: Endoscopy;  Laterality: N/A;  . CORONARY STENT PLACEMENT     [facility]  . LEFT HEART CATH AND CORONARY ANGIOGRAPHY N/A 06/08/2017   Procedure: LEFT HEART CATH AND CORONARY ANGIOGRAPHY;  Surgeon: Alwyn Peaallwood, Dwayne D, MD;  Location: [facility] INVASIVE CV LAB;  Service: Cardiovascular;  Laterality: N/A;     Home Meds: Prior to Admission medications   Medication Sig Start Date End Date Taking? Authorizing Provider  aspirin EC 81 MG tablet Take 81 mg by mouth daily.   Yes [provider]  Biotin w/ Vitamins C & E (HAIR/SKIN/NAILS PO) Take 1 tablet by mouth daily.   Yes [provider]  Cholecalciferol (VITAMIN D3) 1000 units CAPS Take 1,000 Units by mouth 2 (two) times daily.   Yes [provider]  clopidogrel (PLAVIX) 75 MG tablet Take 75 mg by mouth daily.   Yes [provider]  cyanocobalamin (,VITAMIN B-12,) 1000 MCG/ML injection Inject 1,000 mcg into the muscle every 30 (thirty) days.   Yes [provider]  cyclobenzaprine (FLEXERIL) 10 MG tablet Take 10 mg by mouth 3 (three) times daily as needed for muscle spasms.   Yes [provider]  ferrous sulfate 325 (65 FE) MG tablet Take 325 mg by mouth 2 (two) times daily with a meal.    Yes [provider]  gabapentin (NEURONTIN) 300 MG capsule Take 600 mg by mouth 3 (three) times daily.    Yes [provider]  glipiZIDE (GLUCOTROL XL) 10 MG 24 hr tablet Take 10 mg by mouth 2 (two) times daily.  Yes [provider]  isosorbide mononitrate (IMDUR) 30 MG 24 hr tablet Take 30 mg by mouth 2 (two) times daily.    Yes [provider]  levothyroxine (SYNTHROID, LEVOTHROID) 50 MCG tablet Take 50 mcg by mouth daily before breakfast.    Yes [provider]  lisinopril (PRINIVIL,ZESTRIL) 20 MG tablet Take 20 mg by mouth daily.   Yes [provider]  metFORMIN (GLUCOPHAGE) 1000 MG tablet Take 1,000 mg by mouth 2 (two) times daily.   Yes [provider]  niacin 500 MG tablet Take 500 mg by  mouth 2 (two) times daily.    Yes [provider]  nitroGLYCERIN (NITROLINGUAL) 0.4 MG/SPRAY spray Place 1 spray under the tongue every 5 (five) minutes x 3 doses as needed for chest pain.   Yes [provider]  pantoprazole (PROTONIX) 40 MG tablet Take 40 mg by mouth daily at 6 PM.    Yes [provider]  ranitidine (ZANTAC) 150 MG tablet Take 150 mg by mouth daily.    Yes [provider]  amLODipine (NORVASC) 5 MG tablet Take 2.5 mg by mouth daily.  06/02/17   [provider]  atorvastatin (LIPITOR) 80 MG tablet Take 80 mg by mouth daily.    [provider]  hydrochlorothiazide (HYDRODIURIL) 25 MG tablet Take 25 mg by mouth daily.    [provider]  HYDROcodone-acetaminophen (NORCO/VICODIN) 5-325 MG tablet Take 1 tablet by mouth every 4 (four) hours as needed. Patient not taking: Reported on 07/11/2017 12/01/16   Minna Antis, MD    Inpatient Medications:  . amLODipine  2.5 mg Oral Daily  . aspirin EC  81 mg Oral Daily  . atorvastatin  80 mg Oral Daily  . cholecalciferol  1,000 Units Oral BID  . clopidogrel  75 mg Oral Daily  . cyanocobalamin  1,000 mcg Intramuscular Q30 days  . docusate sodium  100 mg Oral BID  . famotidine  10 mg Oral Daily  . ferrous sulfate  325 mg Oral BID WC  . heparin  5,000 Units Subcutaneous Q8H  . insulin aspart  0-5 Units Subcutaneous QHS  . insulin aspart  0-9 Units Subcutaneous TID WC  . isosorbide mononitrate  30 mg Oral BID  . levothyroxine  50 mcg Oral QAC breakfast  . lisinopril  20 mg Oral Daily  . niacin  500 mg Oral BID  . pantoprazole  40 mg Oral q1800     Allergies:  Allergies  Allergen Reactions  . Biaxin [Clarithromycin] Hives  . Homatropine Other (See Comments)    Hycodan  . Januvia [Sitagliptin] Other (See Comments)    Unknown  . Macrodantin [Nitrofurantoin Macrocrystal] Other (See Comments)    Unknown  . Naproxen Other (See Comments)    Unknown  . Propoxyphene  Other (See Comments)    Darvocet  . Tramadol Other (See Comments)    Unknown  . Penicillins Rash and Other (See Comments)    Has patient had a PCN reaction causing immediate rash, facial/tongue/throat swelling, SOB or lightheadedness with hypotension: No Has patient had a PCN reaction causing severe rash involving mucus membranes or skin necrosis: No Has patient had a PCN reaction that required hospitalization: Yes Has patient had a PCN reaction occurring within the last 10 years: Unknown If all of the above answers are "NO", then may proceed with Cephalosporin use.     Social History   Socioeconomic History  . Marital status: Married    Spouse name: Not on  file  . Number of children: Not on file  . Years of education: Not on file  . Highest education level: Not on file  Social Needs  . Financial resource strain: Not on file  . Food insecurity - worry: Not on file  . Food insecurity - inability: Not on file  . Transportation needs - medical: Not on file  . Transportation needs - non-medical: Not on file  Occupational History  . Not on file  Tobacco Use  . Smoking status: Never Smoker  . Smokeless tobacco: Never Used  Substance and Sexual Activity  . Alcohol use: No  . Drug use: No  . Sexual activity: Not on file  Other Topics Concern  . Not on file  Social History Narrative  . Not on file     Family History  Problem Relation Age of Onset  . Breast cancer Neg Hx      Review of Systems: A 12-system review of systems was performed and is negative except as noted in the HPI.  Labs: Recent Labs    08/02/17 0408 08/02/17 0925  CKMB 2.4  --   TROPONINI  --  <0.03   Lab Results  Component Value Date   WBC 4.6 08/02/2017   HGB 11.6 (L) 08/02/2017   HCT 34.6 (L) 08/02/2017   MCV 89.4 08/02/2017   PLT 318 08/02/2017    Recent Labs  Lab 08/02/17 0408  NA 129*  K 4.2  CL 104  CO2 23  BUN 8  CREATININE 0.76  CALCIUM 8.4*  GLUCOSE 147*   No results found  for: CHOL, HDL, LDLCALC, TRIG No results found for: DDIMER  Radiology/Studies:  Ct Head Wo Contrast  Result Date: 07/11/2017 CLINICAL DATA:  Dizzy and weak EXAM: CT HEAD WITHOUT CONTRAST TECHNIQUE: Contiguous axial images were obtained from the base of the skull through the vertex without intravenous contrast. COMPARISON:  Head CT 05/29/2017, brain MRI 10/16/2015 FINDINGS: Brain: No evidence of acute infarction, hemorrhage, hydrocephalus, extra-axial collection or mass lesion/mass effect. Vascular: No hyperdense vessels.  Carotid artery calcification. Skull: Normal. Negative for fracture or focal lesion. Sinuses/Orbits: No acute finding. Other: None IMPRESSION: Negative.  No CT evidence for acute intracranial abnormality. Electronically Signed   By: Jasmine Pang M.D.   On: 07/11/2017 18:12    Wt Readings from Last 3 Encounters:  08/01/17 74.2 kg (163 lb 9.3 oz)  07/11/17 70.8 kg (156 lb)  06/08/17 69.9 kg (154 lb)    EKG: nsr with pacs. No afib noted at present  Physical Exam:  Blood pressure (!) 144/50, pulse (!) 54, temperature 98.4 F (36.9 C), temperature source Oral, resp. rate 20, height 5\' 1"  (1.549 m), weight 74.2 kg (163 lb 9.3 oz), SpO2 97 %. Body mass index is 30.91 kg/m. General: Well developed, well nourished, in no acute distress. Head: Normocephalic, atraumatic, sclera non-icteric, no xanthomas, nares are without discharge.  Neck: Negative for carotid bruits. JVD not elevated. Lungs: Clear bilaterally to auscultation without wheezes, rales, or rhonchi. Breathing is unlabored. Heart: RRR with S1 S2. No murmurs, rubs, or gallops appreciated. Abdomen: Soft, non-tender, non-distended with normoactive bowel sounds. No hepatomegaly. No rebound/guarding. No obvious abdominal masses. Msk:  Strength and tone appear normal for age. Extremities: No clubbing or cyanosis. No edema.  Distal pedal pulses are 2+ and equal bilaterally. Neuro: Alert and oriented X 3. No facial asymmetry.  No focal deficit. Moves all extremities spontaneously. Psych:  Responds to questions appropriately with a normal affect.  Assessment and Plan  72 yo female with history of cad s/p pci [facility] with cardiac cath in 1/19 showeing widely patent stents with no significant progression of disease. She was somewhat hypertensive on admission. Brain MRI was not completed today due to claustrophobia. She is neurologically intact. Etiology of her dizziness does not appear to be due to dysrhythmia or hypotension. Have increased her amlodipine to 5 mg daily and would continue with her  Plavix, asa, atorvastatin, lisinopril, imdur at current doses. Does not appear to require any further invasive or noninvasive cardiac workup at this time.   Signed, Dalia Heading MD 08/02/2017, 1:00 PM Pager: [phone]) [phone]

## 2017-08-02 NOTE — Progress Notes (Signed)
MD aware of DBP and HR (40's). Okay with pt receiving IMDUR dose tonight.

## 2017-08-03 LAB — BASIC METABOLIC PANEL
Anion gap: 8 (ref 5–15)
BUN: 12 mg/dL (ref 6–20)
CHLORIDE: 107 mmol/L (ref 101–111)
CO2: 23 mmol/L (ref 22–32)
CREATININE: 0.77 mg/dL (ref 0.44–1.00)
Calcium: 8.6 mg/dL — ABNORMAL LOW (ref 8.9–10.3)
GFR calc non Af Amer: >60 mL/min (ref >60)
Glucose, Bld: 145 mg/dL — ABNORMAL HIGH (ref 65–99)
Potassium: 4 mmol/L (ref 3.5–5.1)
Sodium: 138 mmol/L (ref 135–145)

## 2017-08-03 LAB — GLUCOSE, CAPILLARY
GLUCOSE-CAPILLARY: 301 mg/dL — AB (ref 65–99)
Glucose-Capillary: 156 mg/dL — ABNORMAL HIGH (ref 65–99)

## 2017-08-03 MED ORDER — DULOXETINE HCL 20 MG PO CPEP: 20.0000 mg | ORAL_CAPSULE | Freq: Every day | ORAL | 0 refills | Status: DC

## 2017-08-03 MED ORDER — AMLODIPINE BESYLATE 5 MG PO TABS: 5.0000 mg | ORAL_TABLET | Freq: Every day | ORAL | 0 refills | Status: DC

## 2017-08-03 MED ORDER — GLIPIZIDE ER 5 MG PO TB24: 5.0000 mg | ORAL_TABLET | Freq: Two times a day (BID) | ORAL | 0 refills | Status: AC

## 2017-08-03 NOTE — Progress Notes (Signed)
[facility] [place] [facility]   PATIENT NAME: Christine Zimmerman    MR#:  [id number]  DATE OF BIRTH:  04/16/1946  SUBJECTIVE:  CHIEF COMPLAINT:   Chief Complaint  Patient presents with  . Hypertension    Came with Feeling dizzi and noted to be low Sodium and Uncontrolled Htn.  REVIEW OF SYSTEMS:  CONSTITUTIONAL: No fever, fatigue or weakness.  EYES: No blurred or double vision.  EARS, NOSE, AND THROAT: No tinnitus or ear pain.  RESPIRATORY: No cough, shortness of breath, wheezing or hemoptysis.  CARDIOVASCULAR: No chest pain, orthopnea, edema.  GASTROINTESTINAL: No nausea, vomiting, diarrhea or abdominal pain.  GENITOURINARY: No dysuria, hematuria.  ENDOCRINE: No polyuria, nocturia,  HEMATOLOGY: No anemia, easy bruising or bleeding SKIN: No rash or lesion. MUSCULOSKELETAL: No joint pain or arthritis.   NEUROLOGIC: No tingling, numbness, weakness.  PSYCHIATRY: No anxiety or depression.   ROS  DRUG ALLERGIES:   Allergies  Allergen Reactions  . Biaxin [Clarithromycin] Hives  . Homatropine Other (See Comments)    Hycodan  . Januvia [Sitagliptin] Other (See Comments)    Unknown  . Macrodantin [Nitrofurantoin Macrocrystal] Other (See Comments)    Unknown  . Naproxen Other (See Comments)    Unknown  . Propoxyphene Other (See Comments)    Darvocet  . Tramadol Other (See Comments)    Unknown  . Penicillins Rash and Other (See Comments)    Has patient had a PCN reaction causing immediate rash, facial/tongue/throat swelling, SOB or lightheadedness with hypotension: No Has patient had a PCN reaction causing severe rash involving mucus membranes or skin necrosis: No Has patient had a PCN reaction that required hospitalization: Yes Has patient had a PCN reaction occurring within the last 10 years: Unknown If all of the above answers are "NO", then may proceed with Cephalosporin use.     VITALS:  Blood pressure (!) 145/46, pulse (!) 55, temperature (!)  97.5 F (36.4 C), temperature source Oral, resp. rate 16, height 5\' 1"  (1.549 m), weight 73.1 kg (161 lb 2.5 oz), SpO2 99 %.  PHYSICAL EXAMINATION:   GENERAL:  72 y.o.-year-old patient lying in the bed with no acute distress, feeling better after blood pressure medication in the emergency room.  EYES: Pupils equal, round, reactive to light and accommodation. No scleral icterus. Extraocular muscles intact.  HEENT: Head atraumatic, normocephalic. Oropharynx and nasopharynx clear.  NECK:  Supple, no jugular venous distention. No thyroid enlargement, no tenderness.  LUNGS: Normal breath sounds bilaterally, no wheezing, rales,rhonchi or crepitation. No use of accessory muscles of respiration.  CARDIOVASCULAR: S1, S2 normal. No S3/S4.  ABDOMEN: Soft, nontender, nondistended. Bowel sounds present. No organomegaly or mass.  EXTREMITIES: No pedal edema, cyanosis, or clubbing.  NEUROLOGIC: Cranial nerves II through XII are intact. Muscle strength 5/5 in all extremities.  Slightly reduced sensation on the left side of the body. Gait not checked, due to dizziness when standing.  PSYCHIATRIC: The patient is alert and oriented x 3.  SKIN: No obvious rash, lesion, or ulcer.     Physical Exam LABORATORY PANEL:   CBC Recent Labs  Lab 08/02/17 0408  WBC 4.6  HGB 11.6*  HCT 34.6*  PLT 318   ------------------------------------------------------------------------------------------------------------------  Chemistries  Recent Labs  Lab 08/03/17 0512  NA 138  K 4.0  CL 107  CO2 23  GLUCOSE 145*  BUN 12  CREATININE 0.77  CALCIUM 8.6*   ------------------------------------------------------------------------------------------------------------------  Cardiac Enzymes Recent Labs  Lab 08/02/17 1345 08/02/17 1721  TROPONINI <  0.03 <0.03   ------------------------------------------------------------------------------------------------------------------  RADIOLOGY:  No results  found.  ASSESSMENT AND PLAN:   Active Problems:   Dizziness  1.  Hypertensive urgency.    continue blood pressure medications with a goal of lowering the blood pressure slowly  Check Orthostatic vitals. 2.  Lightheadedness, associated with headache, blurry vision and left-sided body numbness and tingling are likely related to elevated blood pressure, hyponatremia and possibly side effects from medications.    check brain MRI.  3.  Acute hyponatremia, sodium level is 123- now 129, from 132, three weeks ago.   Discontinue hydrochlorothiazide and on gentle IV hydration.  Continue to monitor sodium level closely. 4.  Diabetes type 2, fairly well controlled.  Low-carb diet.    check blood sugars before meals and at bedtime and use insulin treatment during the hospital stay.   She also had some low readings on Blood sugar check, advised to stop oral meds and keep diet control. 3.  Coronary artery disease, stable, continue medical management. 4. Bradycardia    Noted some bradycardia on her tele- She also have hx of CAD, recent cath was negative.    Cardiology consult.  All the records are reviewed and case discussed with Care Management/Social Workerr. Management plans discussed with the patient, family and they are in agreement.  CODE STATUS: Full.  TOTAL TIME TAKING CARE OF THIS PATIENT: 35 minutes.    POSSIBLE D/C IN 1-2 DAYS, DEPENDING ON CLINICAL CONDITION.   Altamese Dilling M.D on 08/03/2017   Between 7am to 6pm - Pager - [phone]  After 6pm go to [email or web address] - password [email or web address]  [facility] [place] [facility]  Office  [phone]  CC: Primary care physician; Patrice Paradise, MD  Note: This dictation was prepared with Dragon dictation along with smaller phrase technology. Any transcriptional errors that result from this process are unintentional.

## 2017-08-03 NOTE — Discharge Summary (Signed)
[facility] [place] at [facility]   PATIENT NAME: Christine Zimmerman    MR#:  [id number]  DATE OF BIRTH:  [birth year]  DATE OF ADMISSION:  08/01/2017 ADMITTING PHYSICIAN: Cammy Copa, MD  DATE OF DISCHARGE: 08/03/2017   PRIMARY CARE PHYSICIAN: Patrice Paradise, MD    ADMISSION DIAGNOSIS:  Dizziness [R42] Hyponatremia [E87.1] Hypertension, unspecified type [I10]  DISCHARGE DIAGNOSIS:  Active Problems:   Dizziness   SECONDARY DIAGNOSIS:   Past Medical History:  Diagnosis Date  . Atrial fibrillation (HCC)   . Chronic pain   . Coronary artery disease   . Diabetes mellitus without complication (HCC)   . GERD (gastroesophageal reflux disease)   . High cholesterol   . Hypertension   . Hypothyroidism     HOSPITAL COURSE:   1.Hypertensive urgency.  continue blood pressure medications with a goal of lowering the blood pressure slowly  Check Orthostatic vitals. Stable.   Stopped HCTZ and increased dose of Amlodipine, now BP is in range of 120-150. 2.Lightheadedness, associated with headache,blurry vision and left-sided body numbness and tingling are likely related to elevated blood pressure,hyponatremia and possibly side effects from medications.  check brain MRI.    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   No symptoms now, ambulate fine. 3.Acute hyponatremia,sodium level is 123- now 129,from 132,threeweeks ago.Improved to 138. Discontinue hydrochlorothiazide and on gentle IV hydration.Continue to monitor sodium level closely. 4.Diabetes type 2,fairly well controlled.Low-carb diet.   check blood sugars before meals and at bedtime and use insulin treatment during the hospital stay.   She also had some low readings on Blood sugar check, advised to stop oral meds and keep diet control.  Stop metformin after d/c and decrease dose of Glimeperide.   Encouraged to check Blood sugar daily at home. 3.Coronary artery  disease, stable, continue medical management. 4. Bradycardia    Noted some bradycardia on her tele- She also have hx of CAD, recent cath was negative.    Cardiology consult appreciated, no change for now.    DISCHARGE CONDITIONS:   Stable.  CONSULTS OBTAINED:  Treatment Team:  Alwyn Pea, MD Dalia Heading, MD  DRUG ALLERGIES:   Allergies  Allergen Reactions  . Biaxin [Clarithromycin] Hives  . Homatropine Other (See Comments)    Hycodan  . Januvia [Sitagliptin] Other (See Comments)    Unknown  . Macrodantin [Nitrofurantoin Macrocrystal] Other (See Comments)    Unknown  . Naproxen Other (See Comments)    Unknown  . Propoxyphene Other (See Comments)    Darvocet  . Tramadol Other (See Comments)    Unknown  . Penicillins Rash and Other (See Comments)    Has patient had a PCN reaction causing immediate rash, facial/tongue/throat swelling, SOB or lightheadedness with hypotension: No Has patient had a PCN reaction causing severe rash involving mucus membranes or skin necrosis: No Has patient had a PCN reaction that required hospitalization: Yes Has patient had a PCN reaction occurring within the last 10 years: Unknown If all of the above answers are "NO", then may proceed with Cephalosporin use.     DISCHARGE MEDICATIONS:   Allergies as of 08/03/2017      Reactions   Biaxin [clarithromycin] Hives   Homatropine Other (See Comments)   Hycodan   Januvia [sitagliptin] Other (See Comments)   Unknown   Macrodantin [nitrofurantoin Macrocrystal] Other (See Comments)   Unknown   Naproxen Other (See Comments)   Unknown   Propoxyphene Other (See Comments)  Darvocet   Tramadol Other (See Comments)   Unknown   Penicillins Rash, Other (See Comments)   Has patient had a PCN reaction causing immediate rash, facial/tongue/throat swelling, SOB or lightheadedness with hypotension: No Has patient had a PCN reaction causing severe rash involving mucus membranes or skin  necrosis: No Has patient had a PCN reaction that required hospitalization: Yes Has patient had a PCN reaction occurring within the last 10 years: Unknown If all of the above answers are "NO", then may proceed with Cephalosporin use.      Medication List    STOP taking these medications   hydrochlorothiazide 25 MG tablet Commonly known as:  HYDRODIURIL   metFORMIN 1000 MG tablet Commonly known as:  GLUCOPHAGE   ranitidine 150 MG tablet Commonly known as:  ZANTAC     TAKE these medications   amLODipine 5 MG tablet Commonly known as:  NORVASC Take 1 tablet (5 mg total) by mouth daily. Start taking on:  08/04/2017 What changed:  how much to take   aspirin EC 81 MG tablet Take 81 mg by mouth daily.   atorvastatin 80 MG tablet Commonly known as:  LIPITOR Take 80 mg by mouth daily.   clopidogrel 75 MG tablet Commonly known as:  PLAVIX Take 75 mg by mouth daily.   cyanocobalamin 1000 MCG/ML injection Commonly known as:  (VITAMIN B-12) Inject 1,000 mcg into the muscle every 30 (thirty) days.   cyclobenzaprine 10 MG tablet Commonly known as:  FLEXERIL Take 10 mg by mouth 3 (three) times daily as needed for muscle spasms.   DULoxetine 20 MG capsule Commonly known as:  CYMBALTA Take 1 capsule (20 mg total) by mouth at bedtime.   ferrous sulfate 325 (65 FE) MG tablet Take 325 mg by mouth 2 (two) times daily with a meal.   gabapentin 300 MG capsule Commonly known as:  NEURONTIN Take 600 mg by mouth 3 (three) times daily.   glipiZIDE 5 MG 24 hr tablet Commonly known as:  GLUCOTROL XL Take 1 tablet (5 mg total) by mouth 2 (two) times daily. What changed:    medication strength  how much to take   HAIR/SKIN/NAILS PO Take 1 tablet by mouth daily.   HYDROcodone-acetaminophen 5-325 MG tablet Commonly known as:  NORCO/VICODIN Take 1 tablet by mouth every 4 (four) hours as needed.   isosorbide mononitrate 30 MG 24 hr tablet Commonly known as:  IMDUR Take 30 mg by  mouth 2 (two) times daily.   levothyroxine 50 MCG tablet Commonly known as:  SYNTHROID, LEVOTHROID Take 50 mcg by mouth daily before breakfast.   lisinopril 20 MG tablet Commonly known as:  PRINIVIL,ZESTRIL Take 20 mg by mouth daily.   niacin 500 MG tablet Take 500 mg by mouth 2 (two) times daily.   nitroGLYCERIN 0.4 MG/SPRAY spray Commonly known as:  NITROLINGUAL Place 1 spray under the tongue every 5 (five) minutes x 3 doses as needed for chest pain.   pantoprazole 40 MG tablet Commonly known as:  PROTONIX Take 40 mg by mouth daily at 6 PM.   Vitamin D3 1000 units Caps Take 1,000 Units by mouth 2 (two) times daily.        DISCHARGE INSTRUCTIONS:    Follow with PMD in 6 days.  If you experience worsening of your admission symptoms, develop shortness of breath, life threatening emergency, suicidal or homicidal thoughts you must seek medical attention immediately by calling 911 or calling your MD immediately  if symptoms less severe.  You Must read complete instructions/literature along with all the possible adverse reactions/side effects for all the Medicines you take and that have been prescribed to you. Take any new Medicines after you have completely understood and accept all the possible adverse reactions/side effects.   Please note  You were cared for by a hospitalist during your hospital stay. If you have any questions about your discharge medications or the care you received while you were in the hospital after you are discharged, you can call the unit and asked to speak with the hospitalist on call if the hospitalist that took care of you is not available. Once you are discharged, your primary care physician will handle any further medical issues. Please note that NO REFILLS for any discharge medications will be authorized once you are discharged, as it is imperative that you return to your primary care physician (or establish a relationship with a primary care  physician if you do not have one) for your aftercare needs so that they can reassess your need for medications and monitor your lab values.    Today   CHIEF COMPLAINT:   Chief Complaint  Patient presents with  . Hypertension    HISTORY OF PRESENT ILLNESS:  Stanton KidneyShobhna Gobert  is a 72 y.o. female with a known history of hypertension, coronary artery disease, diabetes type 2, peripheral neuropathy, chronic pain and paroxysmal atrial fibrillation. Patient presented to emergency room for elevated blood pressure associated with dizziness, headaches and blurry vision.  The dizziness is described as lightheadedness, worse when standing up and walking.  Patient also complains of intermittent numbness and tingling on the left side of her body going on for the past 24 hours.  Her systolic blood pressure at home was greater than 200.  No chest pain, fever, chills, no nausea, vomiting, diarrhea. Patient has been working with neurology for similar symptoms in the past few months.  There has been some changes in her medications in the past 2 months.  She tried meclizine and Tylenol at home without any relief.  Blood present emergency room are remarkable for low sodium level of 123.  UA is negative for UTI. EKG reviewed by myself, is noted without any acute changes. Patient is admitted for further evaluation and treatment.    VITAL SIGNS:  Blood pressure (!) 145/46, pulse (!) 55, temperature (!) 97.5 F (36.4 C), temperature source Oral, resp. rate 16, height 5\' 1"  (1.549 m), weight 73.1 kg (161 lb 2.5 oz), SpO2 99 %.  I/O:    Intake/Output Summary (Last 24 hours) at 08/03/2017 1052 Last data filed at 08/03/2017 [id number] Gross per 24 hour  Intake 600 ml  Output 2800 ml  Net -2200 ml    PHYSICAL EXAMINATION:  GENERAL:  72 y.o.-year-old patient lying in the bed with no acute distress.  EYES: Pupils equal, round, reactive to light and accommodation. No scleral icterus. Extraocular muscles intact.  HEENT:  Head atraumatic, normocephalic. Oropharynx and nasopharynx clear.  NECK:  Supple, no jugular venous distention. No thyroid enlargement, no tenderness.  LUNGS: Normal breath sounds bilaterally, no wheezing, rales,rhonchi or crepitation. No use of accessory muscles of respiration.  CARDIOVASCULAR: S1, S2 normal. No murmurs, rubs, or gallops.  ABDOMEN: Soft, non-tender, non-distended. Bowel sounds present. No organomegaly or mass.  EXTREMITIES: No pedal edema, cyanosis, or clubbing.  NEUROLOGIC: Cranial nerves II through XII are intact. Muscle strength 5/5 in all extremities. Sensation intact. Gait not checked.  PSYCHIATRIC: The patient is alert and oriented x 3.  SKIN: No obvious rash, lesion, or ulcer.   DATA REVIEW:   CBC Recent Labs  Lab 08/02/17 0408  WBC 4.6  HGB 11.6*  HCT 34.6*  PLT 318    Chemistries  Recent Labs  Lab 08/03/17 0512  NA 138  K 4.0  CL 107  CO2 23  GLUCOSE 145*  BUN 12  CREATININE 0.77  CALCIUM 8.6*    Cardiac Enzymes Recent Labs  Lab 08/02/17 1721  TROPONINI <0.03    Microbiology Results  No results found for this or any previous visit.  RADIOLOGY:  No results found.  EKG:   Orders placed or performed during the hospital encounter of 08/01/17  . ED EKG  . ED EKG  . EKG 12-Lead  . EKG 12-Lead      Management plans discussed with the patient, family and they are in agreement.  CODE STATUS:     Code Status Orders  (From admission, onward)        Start     Ordered   08/01/17 2214  Full code  Continuous     08/01/17 2214    Code Status History    Date Active Date Inactive Code Status Order ID Comments User Context   06/08/2017 13:39 06/08/2017 19:34 Full Code [id number]  Alwyn Pea, MD Inpatient   10/23/2015 09:27 10/23/2015 13:39 Full Code [id number]  Alwyn Pea, MD Inpatient   10/16/2015 12:01 10/17/2015 21:36 Full Code [id number]  Katharina Caper, MD Inpatient    Advance Directive Documentation     Most Recent  Value  Type of Advance Directive  Living will  Pre-existing out of facility DNR order (yellow form or pink MOST form)  No data  "MOST" Form in Place?  No data      TOTAL TIME TAKING CARE OF THIS PATIENT: 35 minutes.    Altamese Dilling M.D on 08/03/2017 at 10:52 AM  Between 7am to 6pm - Pager - [phone]  After 6pm go to [email or web address] - password [email or web address]  [facility] [place] [facility]  Office  [phone]  CC: Primary care physician; Patrice Paradise, MD   Note: This dictation was prepared with Dragon dictation along with smaller phrase technology. Any transcriptional errors that result from this process are unintentional.

## 2017-08-03 NOTE — Care Management Important Message (Signed)
Important Message  Patient Details  Name: Christine Zimmerman MRN: [id number] Date of Birth: 07/05/1945   Medicare Important Message Given:  N/A - LOS <3 / Initial given by admissions    Chapman FitchBOWEN, Robi Dewolfe T, RN 08/03/2017, 11:46 AM

## 2017-08-03 NOTE — Discharge Instructions (Signed)
Keed check daily on Blood pressure and blood sugar as we have changed dose of medicines.

## 2017-08-03 NOTE — Progress Notes (Signed)
IV was removed. Discharge instructions, follow-up appointments, and prescriptions were provided to the pt. All questions answered. The pt was taken downstairs via wheelchair by NT.  

## 2017-08-04 ENCOUNTER — Ambulatory Visit: Payer: Medicare Other | Admitting: Physical Therapy

## 2017-08-11 ENCOUNTER — Telehealth: Payer: Self-pay | Admitting: Licensed Clinical Social Worker

## 2017-08-11 NOTE — Telephone Encounter (Signed)
CSW contacted patient this afternoon to follow up with her regarding a response she gave on an EMMI call. She had answered yes to if she was feeling sad/hopeless/anxious. Patient informed CSW that that the only reason she answered yes to that question was because after she discharged she realized she should have agreed to the physician recommendation for her to get an MRI. CSW advised that if she started having symptoms to call her doctor or come to the emergency room. CSW inquired if she had followed up with her primary care physician. Patient stated she had but that her primary care physician told her to call her neurologist. She stated she has left her neurologist a message. Patient stated that her mood is good.  York SpanielMonica Annarae Macnair MSW,LCSW [phone]

## 2017-08-16 ENCOUNTER — Other Ambulatory Visit: Payer: Self-pay | Admitting: Nurse Practitioner

## 2017-08-16 DIAGNOSIS — R2990 Unspecified symptoms and signs involving the nervous system: Secondary | ICD-10-CM

## 2017-08-24 ENCOUNTER — Ambulatory Visit: Payer: Medicare Other

## 2017-08-29 ENCOUNTER — Ambulatory Visit
Admission: RE | Admit: 2017-08-29 | Discharge: 2017-08-29 | Disposition: A | Discharge status: Home / Self Care | Payer: Medicare Other | Source: Ambulatory Visit | Attending: Nurse Practitioner | Admitting: Nurse Practitioner

## 2017-08-29 DIAGNOSIS — R2990 Unspecified symptoms and signs involving the nervous system: Secondary | ICD-10-CM | POA: Insufficient documentation

## 2017-11-02 ENCOUNTER — Other Ambulatory Visit: Payer: Self-pay | Admitting: Physician Assistant

## 2017-11-02 DIAGNOSIS — Z1231 Encounter for screening mammogram for malignant neoplasm of breast: Secondary | ICD-10-CM

## 2017-12-19 ENCOUNTER — Ambulatory Visit
Admission: RE | Admit: 2017-12-19 | Discharge: 2017-12-19 | Disposition: A | Discharge status: Home / Self Care | Payer: Medicare Other | Source: Ambulatory Visit | Attending: Physician Assistant | Admitting: Physician Assistant

## 2017-12-19 DIAGNOSIS — Z1231 Encounter for screening mammogram for malignant neoplasm of breast: Secondary | ICD-10-CM | POA: Insufficient documentation

## 2017-12-27 ENCOUNTER — Other Ambulatory Visit: Payer: Self-pay | Admitting: Physician Assistant

## 2017-12-27 DIAGNOSIS — Z1231 Encounter for screening mammogram for malignant neoplasm of breast: Secondary | ICD-10-CM

## 2018-01-10 ENCOUNTER — Other Ambulatory Visit: Payer: Self-pay | Admitting: Physician Assistant

## 2018-01-10 DIAGNOSIS — N644 Mastodynia: Secondary | ICD-10-CM

## 2018-01-12 ENCOUNTER — Ambulatory Visit
Admission: RE | Admit: 2018-01-12 | Discharge: 2018-01-12 | Disposition: A | Discharge status: Home / Self Care | Payer: Medicare Other | Source: Ambulatory Visit | Attending: Physician Assistant | Admitting: Physician Assistant

## 2018-01-12 DIAGNOSIS — Z1231 Encounter for screening mammogram for malignant neoplasm of breast: Secondary | ICD-10-CM | POA: Insufficient documentation

## 2018-02-01 ENCOUNTER — Observation Stay
Admit: 2018-02-01 | Discharge: 2018-02-01 | Disposition: A | Discharge status: Home / Self Care | Payer: Medicare Other | Attending: Internal Medicine | Admitting: Internal Medicine

## 2018-02-01 ENCOUNTER — Encounter: Payer: Self-pay | Admitting: Emergency Medicine

## 2018-02-01 ENCOUNTER — Observation Stay
Admission: EM | Admit: 2018-02-01 | Discharge: 2018-02-02 | Disposition: A | Discharge status: Home / Self Care | Payer: Medicare Other | Attending: Internal Medicine | Admitting: Internal Medicine

## 2018-02-01 ENCOUNTER — Emergency Department: Payer: Medicare Other

## 2018-02-01 ENCOUNTER — Other Ambulatory Visit: Payer: Self-pay

## 2018-02-01 DIAGNOSIS — F419 Anxiety disorder, unspecified: Secondary | ICD-10-CM | POA: Diagnosis not present

## 2018-02-01 DIAGNOSIS — I110 Hypertensive heart disease with heart failure: Principal | ICD-10-CM | POA: Insufficient documentation

## 2018-02-01 DIAGNOSIS — E039 Hypothyroidism, unspecified: Secondary | ICD-10-CM | POA: Insufficient documentation

## 2018-02-01 DIAGNOSIS — Z23 Encounter for immunization: Secondary | ICD-10-CM | POA: Insufficient documentation

## 2018-02-01 DIAGNOSIS — F329 Major depressive disorder, single episode, unspecified: Secondary | ICD-10-CM | POA: Diagnosis not present

## 2018-02-01 DIAGNOSIS — G5601 Carpal tunnel syndrome, right upper limb: Secondary | ICD-10-CM | POA: Diagnosis not present

## 2018-02-01 DIAGNOSIS — Z955 Presence of coronary angioplasty implant and graft: Secondary | ICD-10-CM | POA: Diagnosis not present

## 2018-02-01 DIAGNOSIS — D649 Anemia, unspecified: Secondary | ICD-10-CM | POA: Diagnosis not present

## 2018-02-01 DIAGNOSIS — Z8673 Personal history of transient ischemic attack (TIA), and cerebral infarction without residual deficits: Secondary | ICD-10-CM | POA: Insufficient documentation

## 2018-02-01 DIAGNOSIS — Z7982 Long term (current) use of aspirin: Secondary | ICD-10-CM | POA: Diagnosis not present

## 2018-02-01 DIAGNOSIS — K219 Gastro-esophageal reflux disease without esophagitis: Secondary | ICD-10-CM | POA: Insufficient documentation

## 2018-02-01 DIAGNOSIS — E1140 Type 2 diabetes mellitus with diabetic neuropathy, unspecified: Secondary | ICD-10-CM | POA: Insufficient documentation

## 2018-02-01 DIAGNOSIS — Z79890 Hormone replacement therapy (postmenopausal): Secondary | ICD-10-CM | POA: Insufficient documentation

## 2018-02-01 DIAGNOSIS — R079 Chest pain, unspecified: Secondary | ICD-10-CM | POA: Insufficient documentation

## 2018-02-01 DIAGNOSIS — Z7902 Long term (current) use of antithrombotics/antiplatelets: Secondary | ICD-10-CM | POA: Insufficient documentation

## 2018-02-01 DIAGNOSIS — E7800 Pure hypercholesterolemia, unspecified: Secondary | ICD-10-CM | POA: Diagnosis not present

## 2018-02-01 DIAGNOSIS — I480 Paroxysmal atrial fibrillation: Secondary | ICD-10-CM | POA: Diagnosis not present

## 2018-02-01 DIAGNOSIS — I5031 Acute diastolic (congestive) heart failure: Secondary | ICD-10-CM | POA: Insufficient documentation

## 2018-02-01 DIAGNOSIS — Z79899 Other long term (current) drug therapy: Secondary | ICD-10-CM | POA: Insufficient documentation

## 2018-02-01 DIAGNOSIS — I2510 Atherosclerotic heart disease of native coronary artery without angina pectoris: Secondary | ICD-10-CM | POA: Insufficient documentation

## 2018-02-01 DIAGNOSIS — R0602 Shortness of breath: Secondary | ICD-10-CM | POA: Diagnosis present

## 2018-02-01 LAB — BASIC METABOLIC PANEL
Anion gap: 7 (ref 5–15)
BUN: 13 mg/dL (ref 8–23)
CHLORIDE: 95 mmol/L — AB (ref 98–111)
CO2: 27 mmol/L (ref 22–32)
CREATININE: 0.77 mg/dL (ref 0.44–1.00)
Calcium: 8.7 mg/dL — ABNORMAL LOW (ref 8.9–10.3)
GFR calc Af Amer: >60 mL/min (ref >60)
GFR calc non Af Amer: >60 mL/min (ref >60)
GLUCOSE: 115 mg/dL — AB (ref 70–99)
Potassium: 4.3 mmol/L (ref 3.5–5.1)
SODIUM: 129 mmol/L — AB (ref 135–145)

## 2018-02-01 LAB — ECHOCARDIOGRAM COMPLETE
HEIGHTINCHES: 60 in
Weight: 2784 oz

## 2018-02-01 LAB — CBC
HCT: 34 % — ABNORMAL LOW (ref 35.0–47.0)
Hemoglobin: 11.8 g/dL — ABNORMAL LOW (ref 12.0–16.0)
MCH: 32 pg (ref 26.0–34.0)
MCHC: 34.7 g/dL (ref 32.0–36.0)
MCV: 92.1 fL (ref 80.0–100.0)
PLATELETS: 264 10*3/uL (ref 150–440)
RBC: 3.69 MIL/uL — ABNORMAL LOW (ref 3.80–5.20)
RDW: 14.2 % (ref 11.5–14.5)
WBC: 4.5 10*3/uL (ref 3.6–11.0)

## 2018-02-01 LAB — GLUCOSE, CAPILLARY
Glucose-Capillary: 150 mg/dL — ABNORMAL HIGH (ref 70–99)
Glucose-Capillary: 144 mg/dL — ABNORMAL HIGH (ref 70–99)

## 2018-02-01 LAB — BRAIN NATRIURETIC PEPTIDE: B Natriuretic Peptide: 208 pg/mL — ABNORMAL HIGH (ref 0.0–100.0)

## 2018-02-01 LAB — TROPONIN I
Troponin I: <0.03 ng/mL (ref <0.03)
Troponin I: <0.03 ng/mL (ref <0.03)
Troponin I: <0.03 ng/mL (ref <0.03)

## 2018-02-01 MED ORDER — TIZANIDINE HCL 2 MG PO TABS
2.0000 mg | ORAL_TABLET | Freq: Every day | ORAL | Status: DC
  Administered 2018-02-01: 2 mg via ORAL
  Filled 2018-02-01 (×2): qty 1

## 2018-02-01 MED ORDER — ONDANSETRON HCL 4 MG/2ML IJ SOLN
INTRAMUSCULAR | Status: AC
  Filled 2018-02-01: qty 2

## 2018-02-01 MED ORDER — SODIUM CHLORIDE 0.9% FLUSH
3.0000 mL | Freq: Two times a day (BID) | INTRAVENOUS | Status: DC
  Administered 2018-02-01: 3 mL via INTRAVENOUS

## 2018-02-01 MED ORDER — INFLUENZA VAC SPLIT HIGH-DOSE 0.5 ML IM SUSY
0.5000 mL | PREFILLED_SYRINGE | INTRAMUSCULAR | Status: AC
  Administered 2018-02-02: 0.5 mL via INTRAMUSCULAR
  Filled 2018-02-01: qty 0.5

## 2018-02-01 MED ORDER — NITROGLYCERIN 2 % TD OINT
0.5000 [in_us] | TOPICAL_OINTMENT | Freq: Once | TRANSDERMAL | Status: AC
  Administered 2018-02-01: 0.5 [in_us] via TOPICAL
  Filled 2018-02-01: qty 1

## 2018-02-01 MED ORDER — FERROUS SULFATE 325 (65 FE) MG PO TABS
325.0000 mg | ORAL_TABLET | Freq: Every day | ORAL | Status: DC
  Administered 2018-02-02: 325 mg via ORAL
  Filled 2018-02-01: qty 1

## 2018-02-01 MED ORDER — TRAZODONE HCL 50 MG PO TABS
25.0000 mg | ORAL_TABLET | Freq: Every evening | ORAL | Status: DC | PRN
  Administered 2018-02-01: 25 mg via ORAL
  Filled 2018-02-01: qty 1

## 2018-02-01 MED ORDER — DOCUSATE SODIUM 100 MG PO CAPS
100.0000 mg | ORAL_CAPSULE | Freq: Two times a day (BID) | ORAL | Status: DC
  Administered 2018-02-01 – 2018-02-02 (×2): 100 mg via ORAL
  Filled 2018-02-01 (×2): qty 1

## 2018-02-01 MED ORDER — NITROGLYCERIN 0.4 MG SL SUBL: 0.4000 mg | SUBLINGUAL_TABLET | SUBLINGUAL | Status: DC | PRN

## 2018-02-01 MED ORDER — FUROSEMIDE 10 MG/ML IJ SOLN
40.0000 mg | Freq: Every day | INTRAMUSCULAR | Status: DC
  Administered 2018-02-01: 40 mg via INTRAVENOUS
  Filled 2018-02-01: qty 4

## 2018-02-01 MED ORDER — ACETAMINOPHEN 325 MG PO TABS
650.0000 mg | ORAL_TABLET | Freq: Four times a day (QID) | ORAL | Status: DC | PRN
  Administered 2018-02-01: 650 mg via ORAL
  Filled 2018-02-01: qty 2

## 2018-02-01 MED ORDER — ONDANSETRON HCL 4 MG PO TABS: 4.0000 mg | ORAL_TABLET | Freq: Four times a day (QID) | ORAL | Status: DC | PRN

## 2018-02-01 MED ORDER — LEVOTHYROXINE SODIUM 50 MCG PO TABS
50.0000 ug | ORAL_TABLET | Freq: Every day | ORAL | Status: DC
  Administered 2018-02-02: 50 ug via ORAL
  Filled 2018-02-01: qty 1

## 2018-02-01 MED ORDER — ACETAMINOPHEN 650 MG RE SUPP: 650.0000 mg | Freq: Four times a day (QID) | RECTAL | Status: DC | PRN

## 2018-02-01 MED ORDER — NITROGLYCERIN 0.4 MG/SPRAY TL SOLN: 1.0000 | Status: DC | PRN

## 2018-02-01 MED ORDER — ATORVASTATIN CALCIUM 20 MG PO TABS
80.0000 mg | ORAL_TABLET | Freq: Every day | ORAL | Status: DC
  Administered 2018-02-01: 80 mg via ORAL
  Filled 2018-02-01: qty 4

## 2018-02-01 MED ORDER — ASPIRIN EC 81 MG PO TBEC
81.0000 mg | DELAYED_RELEASE_TABLET | Freq: Every day | ORAL | Status: DC
  Administered 2018-02-02: 81 mg via ORAL
  Filled 2018-02-01: qty 1

## 2018-02-01 MED ORDER — INSULIN ASPART 100 UNIT/ML ~~LOC~~ SOLN
0.0000 [IU] | Freq: Three times a day (TID) | SUBCUTANEOUS | Status: DC
  Administered 2018-02-01: 1 [IU] via SUBCUTANEOUS
  Administered 2018-02-02: 2 [IU] via SUBCUTANEOUS
  Administered 2018-02-02: 1 [IU] via SUBCUTANEOUS
  Filled 2018-02-01 (×3): qty 1

## 2018-02-01 MED ORDER — ONDANSETRON HCL 4 MG/2ML IJ SOLN: 4.0000 mg | Freq: Four times a day (QID) | INTRAMUSCULAR | Status: DC | PRN

## 2018-02-01 MED ORDER — PIOGLITAZONE HCL 15 MG PO TABS: 15.0000 mg | ORAL_TABLET | Freq: Every day | ORAL | Status: DC

## 2018-02-01 MED ORDER — CLOPIDOGREL BISULFATE 75 MG PO TABS
75.0000 mg | ORAL_TABLET | Freq: Every day | ORAL | Status: DC
  Administered 2018-02-02: 75 mg via ORAL
  Filled 2018-02-01: qty 1

## 2018-02-01 MED ORDER — GABAPENTIN 300 MG PO CAPS
600.0000 mg | ORAL_CAPSULE | Freq: Three times a day (TID) | ORAL | Status: DC
  Administered 2018-02-01 – 2018-02-02 (×3): 600 mg via ORAL
  Filled 2018-02-01 (×3): qty 2

## 2018-02-01 MED ORDER — ASPIRIN 81 MG PO CHEW
162.0000 mg | CHEWABLE_TABLET | Freq: Once | ORAL | Status: AC
  Administered 2018-02-01: 162 mg via ORAL
  Filled 2018-02-01: qty 2

## 2018-02-01 MED ORDER — ENOXAPARIN SODIUM 40 MG/0.4ML ~~LOC~~ SOLN
40.0000 mg | SUBCUTANEOUS | Status: DC
  Administered 2018-02-01: 40 mg via SUBCUTANEOUS
  Filled 2018-02-01: qty 0.4

## 2018-02-01 MED ORDER — AMLODIPINE BESYLATE 5 MG PO TABS: 5.0000 mg | ORAL_TABLET | Freq: Every day | ORAL | Status: DC

## 2018-02-01 MED ORDER — BISACODYL 5 MG PO TBEC: 5.0000 mg | DELAYED_RELEASE_TABLET | Freq: Every day | ORAL | Status: DC | PRN

## 2018-02-01 MED ORDER — DULOXETINE HCL 20 MG PO CPEP: 20.0000 mg | ORAL_CAPSULE | Freq: Every day | ORAL | Status: DC

## 2018-02-01 MED ORDER — ISOSORBIDE MONONITRATE ER 60 MG PO TB24
30.0000 mg | ORAL_TABLET | Freq: Two times a day (BID) | ORAL | Status: DC
  Administered 2018-02-01 – 2018-02-02 (×2): 30 mg via ORAL
  Filled 2018-02-01 (×2): qty 1

## 2018-02-01 NOTE — ED Triage Notes (Signed)
Pt arrives with reports of intermittent pinching pain on the let side of her chest. Pt took 2 asa and 2 nitroglycerin prior to arrival. Pt states she was at the [employer] when the chest pain started and was concerned over a HR of 90 when on treadmill. Pt states "It was only 90 and the high should be 113 and then the pinching started and I was concerned."

## 2018-02-01 NOTE — ED Notes (Signed)
Upon entering room pt began stating "Now! Hurry, get my pillow now! What took you so long?" Pt informed this RN was with another patient and came as soon as exited the patients room. Pt states "I don't care, hurry with my pillow". Pt very upset regarding her care, pt did not want to speak to this nurse about what brought her to the ED. Pt continued to make statements about her "terrible care". Pt states "I will never come back here I will tell everybody not to come back here". Pt requesting exact time of how long it will be before she sees a doctor. Pt informed that the doctor would be in as soon as possible but that it is impossible to give an exact time of when the doctor may be in as emergencies arise that may delay the doctor.

## 2018-02-01 NOTE — ED Notes (Signed)
Pt ambulatory to restroom in room with this nurse. Pt has steady gait and needs minimal assist from nurse. Pt placed back into bed with siderails up, monitor on, and call bell within reach.

## 2018-02-01 NOTE — ED Notes (Signed)
Attempted to give report. Unit secretary states room is not assigned yet and that there is not a nurse to call report to. Will call back.

## 2018-02-01 NOTE — H&P (Signed)
[facility] - [place] at [facility]   PATIENT NAME: Christine Zimmerman    MR#:  [id number]  DATE OF BIRTH:  [birth year]  DATE OF ADMISSION:  02/01/2018  PRIMARY CARE PHYSICIAN: Patrice Paradise, MD   REQUESTING/REFERRING PHYSICIAN: Dr. Dorothea Glassman  CHIEF COMPLAINT: Chest pain   Chief Complaint  Patient presents with  . Chest Pain    HISTORY OF PRESENT ILLNESS:  Christine Zimmerman  is a 72 y.o. female with a known history of coronary artery disease status post 4 stents, diabetes mellitus type 2, depression, neuropathy was doing stationary bike at [employer].  She is already noticed left-sided chest pain that persisted for some time required her to take aspirin, nitroglycerin only with little improvement.  Because of her cardiac history she came to the hospital.  Found to have elevated BNP, congestive heart failure on her lab results.  Patient states that she gained about 15 pounds recently in the last 1 month also has been dealing with lower extremity edema.  Patient was taking amlodipine that was discontinued by cardiology Dr. Juliann Pares, started on Lasix by her PCP recently.  Patient started back on task again.  At this time still has chest discomfort on the left side but not as bad as it was before.  Troponins were negative.  History of CAD, recent cardiac cath in January showed distal LAD lesion 50% stenosis.  PAST MEDICAL HISTORY:   Past Medical History:  Diagnosis Date  . Atrial fibrillation (HCC)   . Chronic pain   . Coronary artery disease   . Diabetes mellitus without complication (HCC)   . GERD (gastroesophageal reflux disease)   . High cholesterol   . Hypertension   . Hypothyroidism     PAST SURGICAL HISTOIRY:   Past Surgical History:  Procedure Laterality Date  . BACK SURGERY    . CARDIAC CATHETERIZATION Left 10/23/2015   Procedure: Left Heart Cath and Coronary Angiography;  Surgeon: Alwyn Pea, MD;  Location: [facility] INVASIVE CV LAB;  Service:  Cardiovascular;  Laterality: Left;  . COLONOSCOPY WITH PROPOFOL N/A 01/03/2017   Procedure: COLONOSCOPY WITH PROPOFOL;  Surgeon: Scot Jun, MD;  Location: [facility] ENDOSCOPY;  Service: Endoscopy;  Laterality: N/A;  . CORONARY STENT PLACEMENT     x 4 at [facility]. x 1 by [facility] in 2004  . LEFT HEART CATH AND CORONARY ANGIOGRAPHY N/A 06/08/2017   Procedure: LEFT HEART CATH AND CORONARY ANGIOGRAPHY;  Surgeon: Alwyn Pea, MD;  Location: [facility] INVASIVE CV LAB;  Service: Cardiovascular;  Laterality: N/A;    SOCIAL HISTORY:   Social History   Tobacco Use  . Smoking status: Never Smoker  . Smokeless tobacco: Never Used  Substance Use Topics  . Alcohol use: No    FAMILY HISTORY:   Family History  Problem Relation Age of Onset  . Breast cancer Neg Hx     DRUG ALLERGIES:   Allergies  Allergen Reactions  . Biaxin [Clarithromycin] Hives  . Homatropine Other (See Comments)    Hycodan  . Januvia [Sitagliptin] Other (See Comments)    Unknown  . Macrodantin [Nitrofurantoin Macrocrystal] Other (See Comments)    Unknown  . Naproxen Other (See Comments)    Unknown  . Propoxyphene Other (See Comments)    Darvocet  . Tramadol Other (See Comments)    Unknown  . Penicillins Rash and Other (See Comments)    Has patient had a PCN reaction causing immediate rash, facial/tongue/throat swelling, SOB or lightheadedness with hypotension:  No Has patient had a PCN reaction causing severe rash involving mucus membranes or skin necrosis: No Has patient had a PCN reaction that required hospitalization: Yes Has patient had a PCN reaction occurring within the last 10 years: Unknown If all of the above answers are "NO", then may proceed with Cephalosporin use.     REVIEW OF SYSTEMS:  CONSTITUTIONAL: No fever, fatigue or weakness.  EYES: No blurred or double vision.  EARS, NOSE, AND THROAT: No tinnitus or ear pain.  RESPIRATORY: No cough, shortness of breath, wheezing or hemoptysis.   CARDIOVASCULAR: Chest pain this morning.  No orthopnea or PND.  Has been having pedal edema.  GASTROINTESTINAL: No nausea, vomiting, diarrhea or abdominal pain.  GENITOURINARY: No dysuria, hematuria.  ENDOCRINE: No polyuria, nocturia,  HEMATOLOGY: No anemia, easy bruising or bleeding SKIN: No rash or lesion. MUSCULOSKELETAL: No joint pain or arthritis.   NEUROLOGIC: No tingling, numbness, weakness.  PSYCHIATRY: No anxiety or depression.   MEDICATIONS AT HOME:   Prior to Admission medications   Medication Sig Start Date End Date Taking? Authorizing Provider  amLODipine (NORVASC) 5 MG tablet Take 1 tablet (5 mg total) by mouth daily. 08/04/17  Yes Altamese DillingVachhani, Vaibhavkumar, MD  aspirin EC 81 MG tablet Take 81 mg by mouth daily.   Yes [provider]  atorvastatin (LIPITOR) 80 MG tablet Take 80 mg by mouth daily.   Yes [provider]  Biotin w/ Vitamins C & E (HAIR/SKIN/NAILS PO) Take 1 tablet by mouth daily.   Yes [provider]  Cholecalciferol (VITAMIN D3) 1000 units CAPS Take 1,000 Units by mouth 2 (two) times daily.   Yes [provider]  clopidogrel (PLAVIX) 75 MG tablet Take 75 mg by mouth daily.   Yes [provider]  ferrous sulfate 325 (65 FE) MG tablet Take 325 mg by mouth daily with breakfast.    Yes [provider]  furosemide (LASIX) 20 MG tablet Take 20 mg by mouth daily as needed for edema.   Yes [provider]  gabapentin (NEURONTIN) 300 MG capsule Take 600 mg by mouth 3 (three) times daily.    Yes [provider]  glipiZIDE (GLUCOTROL XL) 5 MG 24 hr tablet Take 1 tablet (5 mg total) by mouth 2 (two) times daily. Patient taking differently: Take 10 mg by mouth 2 (two) times daily.  08/03/17  Yes Altamese DillingVachhani, Vaibhavkumar, MD  isosorbide mononitrate (IMDUR) 30 MG 24 hr tablet Take 30 mg by mouth 2 (two) times daily.    Yes [provider]  levothyroxine (SYNTHROID, LEVOTHROID) 50 MCG tablet Take 50 mcg  by mouth daily before breakfast.    Yes [provider]  meclizine (ANTIVERT) 25 MG tablet Take 25 mg by mouth 3 (three) times daily as needed for dizziness.   Yes [provider]  metFORMIN (GLUCOPHAGE) 1000 MG tablet Take 1,000 mg by mouth 2 (two) times daily with a meal.   Yes [provider]  niacin 500 MG tablet Take 500 mg by mouth 2 (two) times daily.    Yes [provider]  nitroGLYCERIN (NITROLINGUAL) 0.4 MG/SPRAY spray Place 1 spray under the tongue every 5 (five) minutes x 3 doses as needed for chest pain.   Yes [provider]  pantoprazole (PROTONIX) 40 MG tablet Take 40 mg by mouth every evening.    Yes [provider]  pioglitazone (ACTOS) 15 MG tablet Take 15 mg by mouth daily.   Yes [provider]  potassium chloride (  K-DUR,KLOR-CON) 10 MEQ tablet Take 10 mEq by mouth daily as needed (with furosemide).   Yes [provider]  tiZANidine (ZANAFLEX) 2 MG tablet Take 2 mg by mouth daily.    Yes [provider]  DULoxetine (CYMBALTA) 20 MG capsule Take 1 capsule (20 mg total) by mouth at bedtime. Patient not taking: Reported on 02/01/2018 08/03/17   Altamese Dilling, MD      VITAL SIGNS:  Blood pressure (!) 130/45, pulse (!) 45, temperature 98 F (36.7 C), temperature source Oral, resp. rate 16, height 5' (1.524 m), weight 78.9 kg, SpO2 100 %.  PHYSICAL EXAMINATION:  GENERAL:  72 y.o.-year-old patient lying in the bed with no acute distress.  EYES: Pupils equal, round, reactive to light and accommodation. No scleral icterus. Extraocular muscles intact.  HEENT: Head atraumatic, normocephalic. Oropharynx and nasopharynx clear.  NECK:  Supple, no jugular venous distention. No thyroid enlargement, no tenderness.  LUNGS: Normal breath sounds bilaterally, no wheezing, rales,rhonchi or crepitation. No use of accessory muscles of respiration.  CARDIOVASCULAR: S1, S2 normal. No murmurs, rubs, or gallops.   ABDOMEN: Soft, nontender, nondistended. Bowel sounds present. No organomegaly or mass.  EXTREMITIES: Pedal edema bilaterally nEUROLOGIC: Cranial nerves II through XII are intact. Muscle strength 5/5 in all extremities. Sensation intact. Gait not checked.  PSYCHIATRIC: The patient is alert and oriented x 3.  SKIN: No obvious rash, lesion, or ulcer.   LABORATORY PANEL:   CBC Recent Labs  Lab 02/01/18 0932  WBC 4.5  HGB 11.8*  HCT 34.0*  PLT 264   ------------------------------------------------------------------------------------------------------------------  Chemistries  Recent Labs  Lab 02/01/18 0932  NA 129*  K 4.3  CL 95*  CO2 27  GLUCOSE 115*  BUN 13  CREATININE 0.77  CALCIUM 8.7*   ------------------------------------------------------------------------------------------------------------------  Cardiac Enzymes Recent Labs  Lab 02/01/18 1148  TROPONINI <0.03   ------------------------------------------------------------------------------------------------------------------  RADIOLOGY:  Dg Chest 2 View  Result Date: 02/01/2018 CLINICAL DATA:  Chest pain. EXAM: CHEST - 2 VIEW COMPARISON:  05/29/2017 FINDINGS: There is mild chronic cardiomegaly. There is new slight pulmonary vascular congestion. No discrete infiltrates or effusions. Tiny area of atelectasis at the left base laterally. No acute bone abnormality. IMPRESSION: 1. New pulmonary vascular congestion. 2. Chronic mild cardiomegaly. Electronically Signed   By: Francene Boyers M.D.   On: 02/01/2018 10:14    EKG:   Orders placed or performed during the hospital encounter of 02/01/18  . EKG 12-Lead  . EKG 12-Lead  . ED EKG within 10 minutes  . ED EKG within 10 minutes  EKG shows T wave inversion in lead V1, V3 rate 48 bpm.  Patient told me heart rate was 90 bpm when she was doing her stationary bike at [facility].  IMPRESSION AND PLAN:  72 year old female patient with history of hypertension, CAD, PCI with  multiple stents before, diabetes mellitus type 2 comes in because of left-sided chest pain associated with weight gain, pedal edema. 1.  New onset congestive heart failure with elevated BNP,; admit to hospitalist service on telemetry, start IV Lasix, check daily weights, continue low-salt diet, consulted cardiology because of history of CAD, PCI, recent cardiac cath showing 50% blockage in LAD. 2.  CAD, history of PCI: Slightly bradycardic today, closely on telemetry.  Avoid beta-blockers. 3.  Because of pedal edema discontinue Norvasc. 4.  Diabetes mellitus type 2: Recently having having hypoglycemia at night so evening dose of Glucotrol stopped by PCP.  She is on Glucotrol 5 mg once a day, hold metformin  for possible cardiac intervention, hold Actos due to CHF.  Continue sliding scale insulin with coverage. 5.right   Carpal tunnel syndrome l syndrome, patient has appointment with Dr. Cristopher Peru tomorrow for steroid injection in the right carpal area. 6.  History of CAD, PCI with multiple stents, continue aspirin, statins, Plavix,  Depression: Continue Cymbalta 20 mg p.o. daily.    All the records are reviewed and case discussed with ED provider. Management plans discussed with the patient, family and they are in agreement.  CODE STATUS: full  TOTAL TIME TAKING CARE OF THIS PATIENT: .    Katha Hamming M.D on 02/01/2018 at 2:11 PM  Between 7am to 6pm - Pager - [phone]  After 6pm go to [email or web address] - password [email or web address] [facility]  [place]  [facility]ospital[facility]  Office  [phone]  CC: Primary care physician; Patrice Paradise, MD  Note: This dictation was prepared with Dragon dictation along with smaller phrase technology. Any transcriptional errors that result from this process are unintentional.

## 2018-02-01 NOTE — ED Notes (Addendum)
First Nurse Note: Patient complaining of left sided chest pain, has taken 2 NTG puffs and 2 baby ASA approx 30 min. Ago without relief.  Alert and oriented, color good.  [facility]

## 2018-02-01 NOTE — ED Notes (Signed)
Tammy, RN given report.  

## 2018-02-01 NOTE — ED Provider Notes (Signed)
[facility] Emergency Department Provider Note   ____________________________________________   First MD Initiated Contact with Patient 02/01/18 1130     (approximate)  I have reviewed the triage vital signs and the nursing notes.   HISTORY  Chief Complaint Chest Pain    HPI Christine Zimmerman is a 72 y.o. female who reports that she was going for exercise and was riding on a stationary bike and got her pulse and up into the 80s and one on the treadmill and got her pulse up to 90 and developed some what she describes as sharp stabbing pain in her chest radiated to the left shoulder.  Took some nitro the pain got somewhat better.  She also took some aspirin.  There is some residual pain in the left chest and left shoulder now.  She is not really short of breath.  She has no other complaints.  Patient had a cardiac catheterization in January that showed 50% LAD blockage in 2 places and some other blockages well.  Patient has a chest x-ray showing new CHF and an elevated BNP as well.   Past Medical History:  Diagnosis Date  . Atrial fibrillation (HCC)   . Chronic pain   . Coronary artery disease   . Diabetes mellitus without complication (HCC)   . GERD (gastroesophageal reflux disease)   . High cholesterol   . Hypertension   . Hypothyroidism     Patient Active Problem List   Diagnosis Date Noted  . Dizziness 08/01/2017  . TIA (transient ischemic attack) 10/17/2015  . Hyponatremia 10/17/2015  . Anemia 10/17/2015  . Chest pain 10/16/2015  . Left-sided weakness 10/16/2015  . Hypotension 10/16/2015  . Bradycardia 10/16/2015  . Hypoglycemia 10/16/2015    Past Surgical History:  Procedure Laterality Date  . BACK SURGERY    . CARDIAC CATHETERIZATION Left 10/23/2015   Procedure: Left Heart Cath and Coronary Angiography;  Surgeon: Alwyn Pea, MD;  Location: [facility] INVASIVE CV LAB;  Service: Cardiovascular;  Laterality: Left;  . COLONOSCOPY WITH  PROPOFOL N/A 01/03/2017   Procedure: COLONOSCOPY WITH PROPOFOL;  Surgeon: Scot Jun, MD;  Location: [facility] ENDOSCOPY;  Service: Endoscopy;  Laterality: N/A;  . CORONARY STENT PLACEMENT     x 4 at [facility]. x 1 by [facility] in 2004  . LEFT HEART CATH AND CORONARY ANGIOGRAPHY N/A 06/08/2017   Procedure: LEFT HEART CATH AND CORONARY ANGIOGRAPHY;  Surgeon: Alwyn Pea, MD;  Location: [facility] INVASIVE CV LAB;  Service: Cardiovascular;  Laterality: N/A;    Prior to Admission medications   Medication Sig Start Date End Date Taking? Authorizing Provider  amLODipine (NORVASC) 5 MG tablet Take 1 tablet (5 mg total) by mouth daily. 08/04/17   Altamese Dilling, MD  aspirin EC 81 MG tablet Take 81 mg by mouth daily.    [provider]  atorvastatin (LIPITOR) 80 MG tablet Take 80 mg by mouth daily.    [provider]  Biotin w/ Vitamins C & E (HAIR/SKIN/NAILS PO) Take 1 tablet by mouth daily.    [provider]  Cholecalciferol (VITAMIN D3) 1000 units CAPS Take 1,000 Units by mouth 2 (two) times daily.    [provider]  clopidogrel (PLAVIX) 75 MG tablet Take 75 mg by mouth daily.    [provider]  cyanocobalamin (,VITAMIN B-12,) 1000 MCG/ML injection Inject 1,000 mcg into the muscle every 30 (thirty) days.    [provider]  cyclobenzaprine (FLEXERIL) 10 MG tablet Take  10 mg by mouth 3 (three) times daily as needed for muscle spasms.    [provider]  DULoxetine (CYMBALTA) 20 MG capsule Take 1 capsule (20 mg total) by mouth at bedtime. 08/03/17   Altamese DillingVachhani, Vaibhavkumar, MD  ferrous sulfate 325 (65 FE) MG tablet Take 325 mg by mouth 2 (two) times daily with a meal.     [provider]  gabapentin (NEURONTIN) 300 MG capsule Take 600 mg by mouth 3 (three) times daily.     [provider]  glipiZIDE (GLUCOTROL XL) 5 MG 24 hr tablet Take 1 tablet (5 mg total) by mouth 2 (two) times daily. 08/03/17   Altamese DillingVachhani,  Vaibhavkumar, MD  HYDROcodone-acetaminophen (NORCO/VICODIN) 5-325 MG tablet Take 1 tablet by mouth every 4 (four) hours as needed. Patient not taking: Reported on 07/11/2017 12/01/16   Minna AntisPaduchowski, Kevin, MD  isosorbide mononitrate (IMDUR) 30 MG 24 hr tablet Take 30 mg by mouth 2 (two) times daily.     [provider]  levothyroxine (SYNTHROID, LEVOTHROID) 50 MCG tablet Take 50 mcg by mouth daily before breakfast.     [provider]  lisinopril (PRINIVIL,ZESTRIL) 20 MG tablet Take 20 mg by mouth daily.    [provider]  niacin 500 MG tablet Take 500 mg by mouth 2 (two) times daily.     [provider]  nitroGLYCERIN (NITROLINGUAL) 0.4 MG/SPRAY spray Place 1 spray under the tongue every 5 (five) minutes x 3 doses as needed for chest pain.    [provider]  pantoprazole (PROTONIX) 40 MG tablet Take 40 mg by mouth daily at 6 PM.     [provider]    Allergies Biaxin [clarithromycin]; Homatropine; Januvia [sitagliptin]; Macrodantin [nitrofurantoin macrocrystal]; Naproxen; Propoxyphene; Tramadol; and Penicillins  Family History  Problem Relation Age of Onset  . Breast cancer Neg Hx     Social History Social History   Tobacco Use  . Smoking status: Never Smoker  . Smokeless tobacco: Never Used  Substance Use Topics  . Alcohol use: No  . Drug use: No    Review of Systems  Constitutional: No fever/chills Eyes: No visual changes. ENT: No sore throat. Cardiovascular:  chest pain. Respiratory: Denies shortness of breath. Gastrointestinal: No abdominal pain.  No nausea, no vomiting.  No diarrhea.  No constipation. Genitourinary: Negative for dysuria. Musculoskeletal: Negative for back pain. Skin: Negative for rash. Neurological: Negative for headaches, focal weakness  ____________________________________________   PHYSICAL EXAM:  VITAL SIGNS: ED Triage Vitals  Enc Vitals Group     BP 02/01/18 0926 (!) 160/51     Pulse  Rate 02/01/18 0926 (!) 51     Resp 02/01/18 0926 16     Temp 02/01/18 0926 98 F (36.7 C)     Temp Source 02/01/18 0926 Oral     SpO2 02/01/18 0926 99 %     Weight 02/01/18 0930 174 lb (78.9 kg)     Height 02/01/18 0930 5' (1.524 m)     Head Circumference --      Peak Flow --      Pain Score 02/01/18 0930 3     Pain Loc --      Pain Edu? --      Excl. in GC? --     Constitutional: Alert and oriented.  Looks worried Eyes: Conjunctivae are normal. . Head: Atraumatic. Nose: No congestion/rhinnorhea. Mouth/Throat: Mucous membranes are moist.  Oropharynx non-erythematous. Neck: No stridor.  Cardiovascular: Normal rate, regular rhythm. Grossly normal heart  sounds.  Good peripheral circulation. Respiratory: Normal respiratory effort.  No retractions. Lungs CTAB. Gastrointestinal: Soft and nontender. No distention. No abdominal bruits. No CVA tenderness. Musculoskeletal: No lower extremity tenderness  edema.  No joint effusions. Neurologic:  Normal speech and language. No gross focal neurologic deficits are appreciated. No gait instability. Skin:  Skin is warm, dry and intact. No rash noted. Psychiatric: Mood and affect are normal. Speech and behavior are normal.  ____________________________________________   LABS (all labs ordered are listed, but only abnormal results are displayed)  Labs Reviewed  BASIC METABOLIC PANEL - Abnormal; Notable for the following components:      Result Value   Sodium 129 (*)    Chloride 95 (*)    Glucose, Bld 115 (*)    Calcium 8.7 (*)    All other components within normal limits  CBC - Abnormal; Notable for the following components:   RBC 3.69 (*)    Hemoglobin 11.8 (*)    HCT 34.0 (*)    All other components within normal limits  BRAIN NATRIURETIC PEPTIDE - Abnormal; Notable for the following components:   B Natriuretic Peptide 208.0 (*)    All other components within normal limits  TROPONIN I  TROPONIN I    ____________________________________________  EKG  __EKG read and interpreted by me shows sinus bradycardia rate of 48 right bundle branch block nonspecific ST-T wave changes no acute changes are noted.  Patient reports that she has bradycardia.  About 45.  __________________________________________  RADIOLOGY  ED MD interpretation: Chest x-ray read by radiology reviewed by me shows new onset pulmonary vascular congestion consistent with congestive heart failure  Official radiology report(s): Dg Chest 2 View  Result Date: 02/01/2018 CLINICAL DATA:  Chest pain. EXAM: CHEST - 2 VIEW COMPARISON:  05/29/2017 FINDINGS: There is mild chronic cardiomegaly. There is new slight pulmonary vascular congestion. No discrete infiltrates or effusions. Tiny area of atelectasis at the left base laterally. No acute bone abnormality. IMPRESSION: 1. New pulmonary vascular congestion. 2. Chronic mild cardiomegaly. Electronically Signed   By: Francene Boyers M.D.   On: 02/01/2018 10:14    ____________________________________________   PROCEDURES  Procedure(s) performed:   Procedures   ____________________________________________   INITIAL IMPRESSION / ASSESSMENT AND PLAN / ED COURSE  Patient with known coronary artery disease with new onset congestive heart failure and chest pain.  I reviewed the old records and confirm the fact that she has not had congestive heart failure before.  She in the past had been on Lasix as needed for leg swelling but there is no diagnosis of congestive heart failure or evidence of that in the past.  Because of the chest pain and failure which is new I would put her in the hospital.   *      ____________________________________________   FINAL CLINICAL IMPRESSION(S) / ED DIAGNOSES  Final diagnoses:  Chest pain, unspecified type  Acute diastolic congestive heart failure [facility])     ED Discharge Orders    None       Note:  This document was prepared  using Dragon voice recognition software and may include unintentional dictation errors.    Arnaldo Natal, MD 02/01/18 1251

## 2018-02-01 NOTE — ED Notes (Signed)
First Nurse Note: [facility] of room placement.

## 2018-02-02 LAB — CBC
HEMATOCRIT: 33.3 % — AB (ref 35.0–47.0)
Hemoglobin: 11.8 g/dL — ABNORMAL LOW (ref 12.0–16.0)
MCH: 32.5 pg (ref 26.0–34.0)
MCHC: 35.5 g/dL (ref 32.0–36.0)
MCV: 91.6 fL (ref 80.0–100.0)
Platelets: 274 10*3/uL (ref 150–440)
RBC: 3.64 MIL/uL — ABNORMAL LOW (ref 3.80–5.20)
RDW: 14.1 % (ref 11.5–14.5)
WBC: 4.1 10*3/uL (ref 3.6–11.0)

## 2018-02-02 LAB — BASIC METABOLIC PANEL
Anion gap: 7 (ref 5–15)
BUN: 13 mg/dL (ref 8–23)
CHLORIDE: 100 mmol/L (ref 98–111)
CO2: 27 mmol/L (ref 22–32)
Calcium: 8.6 mg/dL — ABNORMAL LOW (ref 8.9–10.3)
Creatinine, Ser: 0.73 mg/dL (ref 0.44–1.00)
GFR calc Af Amer: >60 mL/min (ref >60)
GFR calc non Af Amer: >60 mL/min (ref >60)
Glucose, Bld: 184 mg/dL — ABNORMAL HIGH (ref 70–99)
POTASSIUM: 3.6 mmol/L (ref 3.5–5.1)
SODIUM: 134 mmol/L — AB (ref 135–145)

## 2018-02-02 LAB — GLUCOSE, CAPILLARY
Glucose-Capillary: 155 mg/dL — ABNORMAL HIGH (ref 70–99)
Glucose-Capillary: 131 mg/dL — ABNORMAL HIGH (ref 70–99)

## 2018-02-02 LAB — HIV ANTIBODY (ROUTINE TESTING W REFLEX): HIV SCREEN 4TH GENERATION: NONREACTIVE

## 2018-02-02 LAB — TROPONIN I: Troponin I: <0.03 ng/mL (ref <0.03)

## 2018-02-02 MED ORDER — LISINOPRIL 20 MG PO TABS
20.0000 mg | ORAL_TABLET | Freq: Every day | ORAL | Status: DC
  Administered 2018-02-02: 20 mg via ORAL
  Filled 2018-02-02: qty 1

## 2018-02-02 NOTE — Progress Notes (Signed)
IV and tele removed from patient. Discharge instructions given to patient verbalized understanding. No acute distress at this time. Husband at bedside to transport patient home.

## 2018-02-02 NOTE — Discharge Summary (Addendum)
[facility] - [place] at [facility]   PATIENT NAME: Christine Zimmerman    MR#:  [id number]  DATE OF BIRTH:  Dec 25, 1945  DATE OF ADMISSION:  02/01/2018 ADMITTING PHYSICIAN: Katha Hamming, MD  DATE OF DISCHARGE: 02/02/2018  PRIMARY CARE PHYSICIAN: Patrice Paradise, MD    ADMISSION DIAGNOSIS:  Acute diastolic congestive heart failure (HCC) [I50.31] Chest pain, unspecified type [R07.9]  DISCHARGE DIAGNOSIS:  Acute mild diastolic HF--resolved Chest pain--resolved SECONDARY DIAGNOSIS:   Past Medical History:  Diagnosis Date  . Atrial fibrillation (HCC)   . Chronic pain   . Coronary artery disease   . Diabetes mellitus without complication (HCC)   . GERD (gastroesophageal reflux disease)   . High cholesterol   . Hypertension   . Hypothyroidism     HOSPITAL COURSE:   72 year old female patient with history of hypertension, CAD, PCI with multiple stents before, diabetes mellitus type 2 comes in because of left-sided chest pain associated with weight gain, pedal edema.  1.  New onset acute Diastolic congestive heart failure with elevated BNP, -recieved IV Lasix in ER-- UOP 2700 cc. sats good. No cp and feels better -pt advised to take lasix as needed with KCL prn - continue low-salt diet, consulted cardiology because of history of CAD, PCI, recent cardiac cath showing 50% blockage in LAD. -pt euvolemic  2.  CAD, history of PCI: Slightly bradycardic today, closely on telemetry.  Avoid beta-blockers.  3.  Because of pedal edema discontinue Norvasc.  4.  Diabetes mellitus type 2:  -resume meds as before   5.right Carpal tunnel syndrome  - patient has appointment with Dr. Cristopher Peru tomorrow for steroid injection in the right carpal area.  6.  History of CAD, PCI with multiple stents, continue aspirin, statins, Plavix  7.Depression: Continue Cymbalta 20 mg p.o. Daily.  Pt wants to go home. She was seen by Dr Juliann Pares and ok with  d/c.  CONSULTS OBTAINED:  Treatment Team:  Alwyn Pea, MD  DRUG ALLERGIES:   Allergies  Allergen Reactions  . Biaxin [Clarithromycin] Hives  . Homatropine Other (See Comments)    Hycodan  . Januvia [Sitagliptin] Other (See Comments)    Unknown  . Macrodantin [Nitrofurantoin Macrocrystal] Other (See Comments)    Unknown  . Naproxen Other (See Comments)    Unknown  . Propoxyphene Other (See Comments)    Darvocet  . Tramadol Other (See Comments)    Unknown  . Penicillins Rash and Other (See Comments)    Has patient had a PCN reaction causing immediate rash, facial/tongue/throat swelling, SOB or lightheadedness with hypotension: No Has patient had a PCN reaction causing severe rash involving mucus membranes or skin necrosis: No Has patient had a PCN reaction that required hospitalization: Yes Has patient had a PCN reaction occurring within the last 10 years: Unknown If all of the above answers are "NO", then may proceed with Cephalosporin use.     DISCHARGE MEDICATIONS:   Allergies as of 02/02/2018      Reactions   Biaxin [clarithromycin] Hives   Homatropine Other (See Comments)   Hycodan   Januvia [sitagliptin] Other (See Comments)   Unknown   Macrodantin [nitrofurantoin Macrocrystal] Other (See Comments)   Unknown   Naproxen Other (See Comments)   Unknown   Propoxyphene Other (See Comments)   Darvocet   Tramadol Other (See Comments)   Unknown   Penicillins Rash, Other (See Comments)   Has patient had a PCN reaction causing immediate rash, facial/tongue/throat swelling,  SOB or lightheadedness with hypotension: No Has patient had a PCN reaction causing severe rash involving mucus membranes or skin necrosis: No Has patient had a PCN reaction that required hospitalization: Yes Has patient had a PCN reaction occurring within the last 10 years: Unknown If all of the above answers are "NO", then may proceed with Cephalosporin use.      Medication List    STOP  taking these medications   amLODipine 5 MG tablet Commonly known as:  NORVASC   DULoxetine 20 MG capsule Commonly known as:  CYMBALTA     TAKE these medications   aspirin EC 81 MG tablet Take 81 mg by mouth daily.   atorvastatin 80 MG tablet Commonly known as:  LIPITOR Take 80 mg by mouth daily.   clopidogrel 75 MG tablet Commonly known as:  PLAVIX Take 75 mg by mouth daily.   ferrous sulfate 325 (65 FE) MG tablet Take 325 mg by mouth daily with breakfast.   furosemide 20 MG tablet Commonly known as:  LASIX Take 20 mg by mouth daily as needed for edema.   gabapentin 300 MG capsule Commonly known as:  NEURONTIN Take 600 mg by mouth 3 (three) times daily.   glipiZIDE 5 MG 24 hr tablet Commonly known as:  GLUCOTROL XL Take 1 tablet (5 mg total) by mouth 2 (two) times daily. What changed:  how much to take   HAIR/SKIN/NAILS PO Take 1 tablet by mouth daily.   isosorbide mononitrate 30 MG 24 hr tablet Commonly known as:  IMDUR Take 30 mg by mouth 2 (two) times daily.   levothyroxine 50 MCG tablet Commonly known as:  SYNTHROID, LEVOTHROID Take 50 mcg by mouth daily before breakfast.   lisinopril 20 MG tablet Commonly known as:  PRINIVIL,ZESTRIL Take 20 mg by mouth daily.   meclizine 25 MG tablet Commonly known as:  ANTIVERT Take 25 mg by mouth 3 (three) times daily as needed for dizziness.   metFORMIN 1000 MG tablet Commonly known as:  GLUCOPHAGE Take 1,000 mg by mouth 2 (two) times daily with a meal.   niacin 500 MG tablet Take 500 mg by mouth 2 (two) times daily.   nitroGLYCERIN 0.4 MG/SPRAY spray Commonly known as:  NITROLINGUAL Place 1 spray under the tongue every 5 (five) minutes x 3 doses as needed for chest pain.   pantoprazole 40 MG tablet Commonly known as:  PROTONIX Take 40 mg by mouth every evening.   pioglitazone 15 MG tablet Commonly known as:  ACTOS Take 15 mg by mouth daily.   potassium chloride 10 MEQ tablet Commonly known as:   K-DUR,KLOR-CON Take 10 mEq by mouth daily as needed (with furosemide).   tiZANidine 2 MG tablet Commonly known as:  ZANAFLEX Take 2 mg by mouth daily.   Vitamin D3 1000 units Caps Take 1,000 Units by mouth 2 (two) times daily.       If you experience worsening of your admission symptoms, develop shortness of breath, life threatening emergency, suicidal or homicidal thoughts you must seek medical attention immediately by calling 911 or calling your MD immediately  if symptoms less severe.  You Must read complete instructions/literature along with all the possible adverse reactions/side effects for all the Medicines you take and that have been prescribed to you. Take any new Medicines after you have completely understood and accept all the possible adverse reactions/side effects.   Please note  You were cared for by a hospitalist during your hospital stay. If you have  any questions about your discharge medications or the care you received while you were in the hospital after you are discharged, you can call the unit and asked to speak with the hospitalist on call if the hospitalist that took care of you is not available. Once you are discharged, your primary care physician will handle any further medical issues. Please note that NO REFILLS for any discharge medications will be authorized once you are discharged, as it is imperative that you return to your primary care physician (or establish a relationship with a primary care physician if you do not have one) for your aftercare needs so that they can reassess your need for medications and monitor your lab values. Today   SUBJECTIVE    Feels a lot better. She is anxious to go home. Denies any chest pain or shortness of breath. No leg swelling. VITAL SIGNS:  Blood pressure (!) 112/46, pulse (!) 46, temperature (!) 97.5 F (36.4 C), temperature source Oral, resp. rate 16, height 5' (1.524 m), weight 75.2 kg, SpO2 99 %.  I/O:     Intake/Output Summary (Last 24 hours) at 02/02/2018 0927 Last data filed at 02/02/2018 0533 Gross per 24 hour  Intake -  Output 2750 ml  Net -2750 ml    PHYSICAL EXAMINATION:  GENERAL:  72 y.o.-year-old patient lying in the bed with no acute distress.  EYES: Pupils equal, round, reactive to light and accommodation. No scleral icterus. Extraocular muscles intact.  HEENT: Head atraumatic, normocephalic. Oropharynx and nasopharynx clear.  NECK:  Supple, no jugular venous distention. No thyroid enlargement, no tenderness.  LUNGS: Normal breath sounds bilaterally, no wheezing, rales,rhonchi or crepitation. No use of accessory muscles of respiration.  CARDIOVASCULAR: S1, S2 normal. No murmurs, rubs, or gallops.  ABDOMEN: Soft, non-tender, non-distended. Bowel sounds present. No organomegaly or mass.  EXTREMITIES: No pedal edema, cyanosis, or clubbing.  NEUROLOGIC: Cranial nerves II through XII are intact. Muscle strength 5/5 in all extremities. Sensation intact. Gait not checked.  PSYCHIATRIC: The patient is alert and oriented x 3.  SKIN: No obvious rash, lesion, or ulcer.   DATA REVIEW:   CBC  Recent Labs  Lab 02/02/18 0134  WBC 4.1  HGB 11.8*  HCT 33.3*  PLT 274    Chemistries  Recent Labs  Lab 02/02/18 0134  NA 134*  K 3.6  CL 100  CO2 27  GLUCOSE 184*  BUN 13  CREATININE 0.73  CALCIUM 8.6*    Microbiology Results   No results found for this or any previous visit (from the past 240 hour(s)).  RADIOLOGY:  Dg Chest 2 View  Result Date: 02/01/2018 CLINICAL DATA:  Chest pain. EXAM: CHEST - 2 VIEW COMPARISON:  05/29/2017 FINDINGS: There is mild chronic cardiomegaly. There is new slight pulmonary vascular congestion. No discrete infiltrates or effusions. Tiny area of atelectasis at the left base laterally. No acute bone abnormality. IMPRESSION: 1. New pulmonary vascular congestion. 2. Chronic mild cardiomegaly. Electronically Signed   By: Francene Boyers M.D.   On:  02/01/2018 10:14     Management plans discussed with the patient, family and they are in agreement.  CODE STATUS:     Code Status Orders  (From admission, onward)         Start     Ordered   02/01/18 1326  Full code  Continuous     02/01/18 1328        Code Status History    Date Active Date Inactive Code Status Order  ID Comments User Context   08/01/2017 2214 08/03/2017 1906 Full Code [id number]  Cammy CopaMaier, Angela, MD Inpatient   06/08/2017 1339 06/08/2017 1934 Full Code [id number]  Alwyn Peaallwood, Dwayne D, MD Inpatient   10/23/2015 0927 10/23/2015 1339 Full Code [id number]  Alwyn Peaallwood, Dwayne D, MD Inpatient   10/16/2015 1201 10/17/2015 2136 Full Code [id number]  Katharina CaperVaickute, Rima, MD Inpatient      TOTAL TIME TAKING CARE OF THIS PATIENT: 40 minutes.    Enedina FinnerSona Lainy Wrobleski M.D on 02/02/2018 at 9:27 AM  Between 7am to 6pm - Pager - [phone] After 6pm go to [email or web address] - Social research officer, [email or web address] [email or web address]  [facility]  Office  [phone]  CC: Primary care physician; Patrice ParadiseMcLaughlin, Miriam K, MD

## 2018-02-02 NOTE — Care Management Note (Signed)
Case Management Note  Patient Details  Name: Carl BestShobhna J Chevalier MRN: [id number] Date of Birth: 04/19/1946  Subjective/Objective:      Discharging today. Observation for SOB.  New CHF.  Brochure given to patient.  Patient states she has a scale at home and verbalizes understanding of weighing self daily.  Notified Clarisa Kindredina Hackney for HF clinic appointment.   Independent in all adls, denies issues accessing medical care, obtaining medications or with transportation.  Current with PCP.  No discharge needs identified at present by care manager or members of care team                Action/Plan:   Expected Discharge Date:  02/02/18               Expected Discharge Plan:     In-House Referral:     Discharge planning Services     Post Acute Care Choice:    Choice offered to:     DME Arranged:    DME Agency:     HH Arranged:    HH Agency:     Status of Service:     If discussed at [employer] Meetings, dates discussed:    Additional Comments:  Sherren KernsJennifer L Illias Pantano, RN 02/02/2018, 10:02 AM

## 2018-02-02 NOTE — Consult Note (Signed)
Reason for Consult: Chest pain known coronary disease Referring Physician: Helane Gunther FNP, primary [facility] hospitalist Cardiologist [facility] Christine Zimmerman is an 72 y.o. female.  HPI: Patient is a 72 year old [place] female presented with known coronary disease PCI and stents hypertension diabetes anxiety depression neuropathy recently complained of stabbing pain.  Sharp chest pain over the last few days patient was at rest was not sure what to do had a cardiac cath earlier this year which just showed mild to moderate coronary disease and widely patent stents.  She is been compliant with her medication but has gained some weight recently has some mild shortness of breath she is complaining of mild tremors in the left arm.  Patient states she exercises regularly on a stationary bike at the [facility].  Patient has been on Lasix for mild fluid accumulation possible edema.  Patient denies further chest pain and feels much improved  Past Medical History:  Diagnosis Date  . Atrial fibrillation ([place])   . Chronic pain   . Coronary artery disease   . Diabetes mellitus without complication ([place])   . GERD (gastroesophageal reflux disease)   . High cholesterol   . Hypertension   . Hypothyroidism     Past Surgical History:  Procedure Laterality Date  . BACK SURGERY    . CARDIAC CATHETERIZATION Left 10/23/2015   Procedure: Left Heart Cath and Coronary Angiography;  Surgeon: Yolonda Kida, MD;  Location: [place] CV LAB;  Service: Cardiovascular;  Laterality: Left;  . COLONOSCOPY WITH PROPOFOL N/A 01/03/2017   Procedure: COLONOSCOPY WITH PROPOFOL;  Surgeon: Manya Silvas, MD;  Location: [facility] ENDOSCOPY;  Service: Endoscopy;  Laterality: N/A;  . CORONARY STENT PLACEMENT     x 4 at [facility]. x 1 by [facility] in 2004  . LEFT HEART CATH AND CORONARY ANGIOGRAPHY N/A 06/08/2017   Procedure: LEFT HEART CATH AND CORONARY ANGIOGRAPHY;  Surgeon: Yolonda Kida, MD;  Location: [place] CV  LAB;  Service: Cardiovascular;  Laterality: N/A;    Family History  Problem Relation Age of Onset  . Breast cancer Neg Hx     Social History:  reports that she has never smoked. She has never used smokeless tobacco. She reports that she does not drink alcohol or use drugs.  Allergies:  Allergies  Allergen Reactions  . Biaxin [Clarithromycin] Hives  . Homatropine Other (See Comments)    Hycodan  . Januvia [Sitagliptin] Other (See Comments)    Unknown  . Macrodantin [Nitrofurantoin Macrocrystal] Other (See Comments)    Unknown  . Naproxen Other (See Comments)    Unknown  . Propoxyphene Other (See Comments)    Darvocet  . Tramadol Other (See Comments)    Unknown  . Penicillins Rash and Other (See Comments)    Has patient had a PCN reaction causing immediate rash, facial/tongue/throat swelling, SOB or lightheadedness with hypotension: No Has patient had a PCN reaction causing severe rash involving mucus membranes or skin necrosis: No Has patient had a PCN reaction that required hospitalization: Yes Has patient had a PCN reaction occurring within the last 10 years: Unknown If all of the above answers are "NO", then may proceed with Cephalosporin use.     Medications: I have reviewed the patient's current medications.  Results for orders placed or performed during the hospital encounter of 02/01/18 (from the past 48 hour(s))  Basic metabolic panel     Status: Abnormal   Collection Time: 02/01/18  9:32 AM  Result Value  Ref Range   Sodium 129 (L) 135 - 145 mmol/L   Potassium 4.3 3.5 - 5.1 mmol/L   Chloride 95 (L) 98 - 111 mmol/L   CO2 27 22 - 32 mmol/L   Glucose, Bld 115 (H) 70 - 99 mg/dL   BUN 13 8 - 23 mg/dL   Creatinine, Ser 0.77 0.44 - 1.00 mg/dL   Calcium 8.7 (L) 8.9 - 10.3 mg/dL   GFR calc non Af Amer >60 >60 mL/min   GFR calc Af Amer >60 >60 mL/min    Comment: (NOTE) The eGFR has been calculated using the CKD EPI equation. This calculation has not been validated in  all clinical situations. eGFR's persistently <60 mL/min signify possible Chronic Kidney Disease.    Anion gap 7 5 - 15    Comment: Performed at [facility], [address] [place], [place] [address]  CBC     Status: Abnormal   Collection Time: 02/01/18  9:32 AM  Result Value Ref Range   WBC 4.5 3.6 - 11.0 K/uL   RBC 3.69 (L) 3.80 - 5.20 MIL/uL   Hemoglobin 11.8 (L) 12.0 - 16.0 g/dL   HCT 34.0 (L) 35.0 - 47.0 %   MCV 92.1 80.0 - 100.0 fL   MCH 32.0 26.0 - 34.0 pg   MCHC 34.7 32.0 - 36.0 g/dL   RDW 14.2 11.5 - 14.5 %   Platelets 264 150 - 440 K/uL    Comment: Performed at [facility], [address], [place], [place] [address]  Troponin I     Status: None   Collection Time: 02/01/18  9:32 AM  Result Value Ref Range   Troponin I <0.03 <0.03 ng/mL    Comment: Performed at [facility], [address] [place], [place] [address]  Brain natriuretic peptide     Status: Abnormal   Collection Time: 02/01/18  9:33 AM  Result Value Ref Range   B Natriuretic Peptide 208.0 (H) 0.0 - 100.0 pg/mL    Comment: Performed at [facility], [address] [place], [place] [address]  Troponin I     Status: None   Collection Time: 02/01/18 11:48 AM  Result Value Ref Range   Troponin I <0.03 <0.03 ng/mL    Comment: Performed at [facility], [address] [place], [place] [address]  HIV antibody (Routine Testing)     Status: None   Collection Time: 02/01/18  2:42 PM  Result Value Ref Range   HIV Screen 4th Generation wRfx Non Reactive Non Reactive    Comment: (NOTE) Performed At: [facility] [place], [place] [id number] Rush Farmer MD [id number]   Troponin I     Status: None   Collection Time: 02/01/18  2:42 PM  Result Value Ref Range   Troponin I <0.03 <0.03 ng/mL    Comment: Performed at [facility], [address] [place], [place] [address]  Glucose, capillary     Status: Abnormal   Collection  Time: 02/01/18  4:25 PM  Result Value Ref Range   Glucose-Capillary 150 (H) 70 - 99 mg/dL   Comment 1 Notify RN    Comment 2 Document in Chart   Troponin I     Status: None   Collection Time: 02/01/18  7:19 PM  Result Value Ref Range   Troponin I <0.03 <0.03 ng/mL    Comment: Performed at [facility], [address] [place], [place] [address]  Glucose, capillary  Status: Abnormal   Collection Time: 02/01/18  9:09 PM  Result Value Ref Range   Glucose-Capillary 144 (H) 70 - 99 mg/dL  Troponin I     Status: None   Collection Time: 02/02/18  1:34 AM  Result Value Ref Range   Troponin I <0.03 <0.03 ng/mL    Comment: Performed at [facility], [address] [place], [place] [address]  Basic metabolic panel     Status: Abnormal   Collection Time: 02/02/18  1:34 AM  Result Value Ref Range   Sodium 134 (L) 135 - 145 mmol/L   Potassium 3.6 3.5 - 5.1 mmol/L   Chloride 100 98 - 111 mmol/L   CO2 27 22 - 32 mmol/L   Glucose, Bld 184 (H) 70 - 99 mg/dL   BUN 13 8 - 23 mg/dL   Creatinine, Ser 0.73 0.44 - 1.00 mg/dL   Calcium 8.6 (L) 8.9 - 10.3 mg/dL   GFR calc non Af Amer >60 >60 mL/min   GFR calc Af Amer >60 >60 mL/min    Comment: (NOTE) The eGFR has been calculated using the CKD EPI equation. This calculation has not been validated in all clinical situations. eGFR's persistently <60 mL/min signify possible Chronic Kidney Disease.    Anion gap 7 5 - 15    Comment: Performed at [facility], [address] [place], [place] [address]  CBC     Status: Abnormal   Collection Time: 02/02/18  1:34 AM  Result Value Ref Range   WBC 4.1 3.6 - 11.0 K/uL   RBC 3.64 (L) 3.80 - 5.20 MIL/uL   Hemoglobin 11.8 (L) 12.0 - 16.0 g/dL   HCT 33.3 (L) 35.0 - 47.0 %   MCV 91.6 80.0 - 100.0 fL   MCH 32.5 26.0 - 34.0 pg   MCHC 35.5 32.0 - 36.0 g/dL   RDW 14.1 11.5 - 14.5 %   Platelets 274 150 - 440 K/uL    Comment: Performed at [facility], [address] [place], [place] [address]  Glucose, capillary     Status: Abnormal   Collection Time: 02/02/18  7:43 AM  Result Value Ref Range   Glucose-Capillary 155 (H) 70 - 99 mg/dL   Comment 1 Notify RN    Comment 2 Document in Chart     Dg Chest 2 View  Result Date: 02/01/2018 CLINICAL DATA:  Chest pain. EXAM: CHEST - 2 VIEW COMPARISON:  05/29/2017 FINDINGS: There is mild chronic cardiomegaly. There is new slight pulmonary vascular congestion. No discrete infiltrates or effusions. Tiny area of atelectasis at the left base laterally. No acute bone abnormality. IMPRESSION: 1. New pulmonary vascular congestion. 2. Chronic mild cardiomegaly. Electronically Signed   By: Lorriane Shire M.D.   On: 02/01/2018 10:14    Review of Systems  Constitutional: Positive for malaise/fatigue.  HENT: Positive for congestion.   Eyes: Negative.   Respiratory: Positive for shortness of breath.   Cardiovascular: Positive for chest pain.  Gastrointestinal: Negative.   Genitourinary: Negative.   Musculoskeletal: Negative.   Skin: Negative.   Neurological: Positive for tremors.  Endo/Heme/Allergies: Negative.   Psychiatric/Behavioral: The patient is nervous/anxious.    Blood pressure (!) 112/46, pulse (!) 46, temperature (!) 97.5 F (36.4 C), temperature source Oral, resp. rate 16, height 5' (1.524 m), weight 75.2 kg, SpO2 99 %. Physical Exam  Nursing note and vitals reviewed. Constitutional: She appears well-developed and well-nourished.  HENT:  Head: Normocephalic and atraumatic.  Eyes: Pupils are  equal, round, and reactive to light. Conjunctivae and EOM are normal.  Neck: Normal range of motion. Neck supple.  Cardiovascular: Normal rate, regular rhythm and normal heart sounds.  Respiratory: Effort normal and breath sounds normal.  GI: Soft. Bowel sounds are normal.    Assessment/Plan: Chest pain atypical Possible angina Known coronary disease Hypertension Diabetes Hyper lipidemia Paroxysmal atrial  fibrillation GERD Anxiety Depression . Plan Rule out myocardial infarction Continue telemetry for 24 to 48 hours Short-term anticoagulation Follow-up troponins and EKGs Continue diabetes management and control Consider functional study as an inpatient versus out Would recommend conservative therapy for now    Ashlynn Gunnels D Mayan Kloepfer 02/02/2018, 8:55 AM

## 2018-02-15 ENCOUNTER — Ambulatory Visit: Payer: Medicare Other | Admitting: Family

## 2018-03-01 ENCOUNTER — Telehealth: Payer: Self-pay | Admitting: Family

## 2018-03-01 ENCOUNTER — Ambulatory Visit: Payer: Medicare Other | Admitting: Family

## 2018-03-01 NOTE — Telephone Encounter (Signed)
Patient did not show for her Heart Failure Clinic appointment on 03/01/18. Will attempt to reschedule.  

## 2018-03-23 ENCOUNTER — Ambulatory Visit: Payer: Medicare Other | Attending: Family | Admitting: Family

## 2018-03-23 ENCOUNTER — Encounter: Payer: Self-pay | Admitting: Family

## 2018-03-23 DIAGNOSIS — Z79899 Other long term (current) drug therapy: Secondary | ICD-10-CM | POA: Insufficient documentation

## 2018-03-23 DIAGNOSIS — E039 Hypothyroidism, unspecified: Secondary | ICD-10-CM | POA: Insufficient documentation

## 2018-03-23 DIAGNOSIS — Z7982 Long term (current) use of aspirin: Secondary | ICD-10-CM | POA: Insufficient documentation

## 2018-03-23 DIAGNOSIS — Z7984 Long term (current) use of oral hypoglycemic drugs: Secondary | ICD-10-CM | POA: Insufficient documentation

## 2018-03-23 DIAGNOSIS — G8929 Other chronic pain: Secondary | ICD-10-CM | POA: Insufficient documentation

## 2018-03-23 DIAGNOSIS — E119 Type 2 diabetes mellitus without complications: Secondary | ICD-10-CM | POA: Diagnosis not present

## 2018-03-23 DIAGNOSIS — Z7902 Long term (current) use of antithrombotics/antiplatelets: Secondary | ICD-10-CM | POA: Diagnosis not present

## 2018-03-23 DIAGNOSIS — I110 Hypertensive heart disease with heart failure: Secondary | ICD-10-CM | POA: Diagnosis not present

## 2018-03-23 DIAGNOSIS — I509 Heart failure, unspecified: Secondary | ICD-10-CM | POA: Diagnosis present

## 2018-03-23 DIAGNOSIS — Z955 Presence of coronary angioplasty implant and graft: Secondary | ICD-10-CM | POA: Insufficient documentation

## 2018-03-23 DIAGNOSIS — I4891 Unspecified atrial fibrillation: Secondary | ICD-10-CM | POA: Diagnosis not present

## 2018-03-23 DIAGNOSIS — I2510 Atherosclerotic heart disease of native coronary artery without angina pectoris: Secondary | ICD-10-CM | POA: Diagnosis not present

## 2018-03-23 DIAGNOSIS — Z79890 Hormone replacement therapy (postmenopausal): Secondary | ICD-10-CM | POA: Diagnosis not present

## 2018-03-23 DIAGNOSIS — E7800 Pure hypercholesterolemia, unspecified: Secondary | ICD-10-CM | POA: Insufficient documentation

## 2018-03-23 DIAGNOSIS — I10 Essential (primary) hypertension: Secondary | ICD-10-CM | POA: Insufficient documentation

## 2018-03-23 DIAGNOSIS — I5032 Chronic diastolic (congestive) heart failure: Secondary | ICD-10-CM

## 2018-03-23 DIAGNOSIS — K219 Gastro-esophageal reflux disease without esophagitis: Secondary | ICD-10-CM | POA: Diagnosis not present

## 2018-03-23 NOTE — Patient Instructions (Addendum)
Begin weighing daily and call for an overnight weight gain of > 2 pounds or a weekly weight gain of >5 pounds.  Talk with Ms Christine Zimmerman regarding stopping actos and using something else for your diabetes.

## 2018-03-23 NOTE — Progress Notes (Signed)
Patient ID: Christine Zimmerman, female    DOB: 14-Mar-1946, 72 y.o.   MRN: [id number]  HPI  Christine Zimmerman is a 72 y/o female with a history of CAD, Dm, hyperlipidemia, HTN, thyroid disease, GERD, chronic pain, atrial fibrillation and chronic heart failure.   Echo report from 02/01/18 reviewed and showed an EF of 60-65%.  Cardiac catheterization done 06/08/17 and showed EF 55-65%,mild to moderate CAD and widely patent stents in circumflex and RCA.   Admitted 02/01/18 due to acute new onset HF. Initially received IV lasix and then transitioned to PRN lasix. Cardiology consult obtained and she was discharged the following day.   She presents today for her initial visit with a chief complaint of minimal shortness of breath upon moderate exertion. She describes this as having been present for several months. She has associated fatigue, slight pedal edema, headache and difficulty sleeping along with this. She denies any abdominal distention, palpitations, chest pain or cough. Currently not weighing because she doesn't have any scales.  Past Medical History:  Diagnosis Date  . Atrial fibrillation (HCC)   . CHF (congestive heart failure) (HCC)   . Chronic pain   . Coronary artery disease   . Diabetes mellitus without complication (HCC)   . GERD (gastroesophageal reflux disease)   . High cholesterol   . Hypertension   . Hypothyroidism    Past Surgical History:  Procedure Laterality Date  . BACK SURGERY    . CARDIAC CATHETERIZATION Left 10/23/2015   Procedure: Left Heart Cath and Coronary Angiography;  Surgeon: Alwyn Pea, MD;  Location: [facility] INVASIVE CV LAB;  Service: Cardiovascular;  Laterality: Left;  . COLONOSCOPY WITH PROPOFOL N/A 01/03/2017   Procedure: COLONOSCOPY WITH PROPOFOL;  Surgeon: Scot Jun, MD;  Location: [facility] ENDOSCOPY;  Service: Endoscopy;  Laterality: N/A;  . CORONARY STENT PLACEMENT     x 4 at [facility]. x 1 by [facility] in 2004  . LEFT HEART CATH AND CORONARY ANGIOGRAPHY  N/A 06/08/2017   Procedure: LEFT HEART CATH AND CORONARY ANGIOGRAPHY;  Surgeon: Alwyn Pea, MD;  Location: [facility] INVASIVE CV LAB;  Service: Cardiovascular;  Laterality: N/A;   Family History  Problem Relation Age of Onset  . Breast cancer Neg Hx    Social History   Tobacco Use  . Smoking status: Never Smoker  . Smokeless tobacco: Never Used  Substance Use Topics  . Alcohol use: No   Allergies  Allergen Reactions  . Biaxin [Clarithromycin] Hives  . Homatropine Other (See Comments)    Hycodan  . Januvia [Sitagliptin] Other (See Comments)    Unknown  . Macrodantin [Nitrofurantoin Macrocrystal] Other (See Comments)    Unknown  . Naproxen Other (See Comments)    Unknown  . Propoxyphene Other (See Comments)    Darvocet  . Tramadol Other (See Comments)    Unknown  . Penicillins Rash and Other (See Comments)    Has patient had a PCN reaction causing immediate rash, facial/tongue/throat swelling, SOB or lightheadedness with hypotension: No Has patient had a PCN reaction causing severe rash involving mucus membranes or skin necrosis: No Has patient had a PCN reaction that required hospitalization: Yes Has patient had a PCN reaction occurring within the last 10 years: Unknown If all of the above answers are "NO", then may proceed with Cephalosporin use.    Prior to Admission medications   Medication Sig Start Date End Date Taking? Authorizing Provider  aspirin EC 81 MG tablet Take 81 mg by mouth daily.  Yes [provider]  atorvastatin (LIPITOR) 80 MG tablet Take 80 mg by mouth daily.   Yes [provider]  Biotin w/ Vitamins C & E (HAIR/SKIN/NAILS PO) Take 1 tablet by mouth daily.   Yes [provider]  chlorthalidone (HYGROTON) 25 MG tablet Take 12.5 mg by mouth every evening.    Yes [provider]  Cholecalciferol (VITAMIN D3) 1000 units CAPS Take 1,000 Units by mouth 2 (two) times daily.   Yes [provider]  clopidogrel  (PLAVIX) 75 MG tablet Take 75 mg by mouth daily.   Yes [provider]  ferrous sulfate 325 (65 FE) MG tablet Take 325 mg by mouth daily with breakfast.    Yes [provider]  furosemide (LASIX) 20 MG tablet Take 20 mg by mouth daily as needed for edema.   Yes [provider]  gabapentin (NEURONTIN) 300 MG capsule Take 600 mg by mouth 3 (three) times daily.    Yes [provider]  glipiZIDE (GLUCOTROL XL) 5 MG 24 hr tablet Take 1 tablet (5 mg total) by mouth 2 (two) times daily. Patient taking differently: Take 10 mg by mouth 2 (two) times daily.  08/03/17  Yes Altamese Dilling, MD  isosorbide mononitrate (IMDUR) 30 MG 24 hr tablet Take 30 mg by mouth 2 (two) times daily.    Yes [provider]  levothyroxine (SYNTHROID, LEVOTHROID) 50 MCG tablet Take 50 mcg by mouth daily before breakfast.    Yes [provider]  lisinopril (PRINIVIL,ZESTRIL) 20 MG tablet Take 20 mg by mouth daily.   Yes [provider]  meclizine (ANTIVERT) 25 MG tablet Take 25 mg by mouth 3 (three) times daily as needed for dizziness.   Yes [provider]  metFORMIN (GLUCOPHAGE) 1000 MG tablet Take 1,000 mg by mouth 2 (two) times daily with a meal.   Yes [provider]  niacin 500 MG tablet Take 500 mg by mouth 2 (two) times daily.    Yes [provider]  nitroGLYCERIN (NITROLINGUAL) 0.4 MG/SPRAY spray Place 1 spray under the tongue every 5 (five) minutes x 3 doses as needed for chest pain.   Yes [provider]  pantoprazole (PROTONIX) 40 MG tablet Take 40 mg by mouth every evening.    Yes [provider]  pioglitazone (ACTOS) 15 MG tablet Take 15 mg by mouth daily.   Yes [provider]  potassium chloride (K-DUR,KLOR-CON) 10 MEQ tablet Take 10 mEq by mouth daily as needed (with furosemide).   Yes [provider]  tiZANidine (ZANAFLEX) 2 MG tablet Take 4 mg by mouth at bedtime.    Yes [provider]    Review of Systems  Constitutional: Positive for fatigue. Negative for appetite change.  HENT: Negative for congestion, postnasal drip and sore throat.   Eyes: Negative.   Respiratory: Positive for shortness of breath. Negative for chest tightness.   Cardiovascular: Positive for leg swelling. Negative for chest pain and palpitations.  Gastrointestinal: Negative for abdominal distention and abdominal pain.  Endocrine: Negative.   Genitourinary: Negative.   Musculoskeletal: Positive for arthralgias (right let pain due to sciatica). Negative for neck pain.  Skin: Negative.   Allergic/Immunologic: Negative.   Neurological: Positive for headaches.  Hematological: Negative for adenopathy. Does not bruise/bleed easily.  Psychiatric/Behavioral: Positive for sleep disturbance (off and on). Negative for decreased concentration. The patient is not nervous/anxious.     Vitals:   03/23/18 1006  BP: (!) 132/53  Pulse: Marland Kitchen)  50  Resp: 18  SpO2: 100%  Weight: 171 lb 8 oz (77.8 kg)  Height: 5' (1.524 m)   Wt Readings from Last 3 Encounters:  03/23/18 171 lb 8 oz (77.8 kg)  02/02/18 165 lb 11.2 oz (75.2 kg)  08/03/17 161 lb 2.5 oz (73.1 kg)   Lab Results  Component Value Date   CREATININE 0.73 02/02/2018   CREATININE 0.77 02/01/2018   CREATININE 0.77 08/03/2017    Physical Exam  Constitutional: She is oriented to person, place, and time. She appears well-developed and well-nourished.  HENT:  Head: Normocephalic and atraumatic.  Neck: Normal range of motion. Neck supple. No JVD present.  Cardiovascular: Regular rhythm. Bradycardia present.  Pulmonary/Chest: Effort normal. No respiratory distress. She has no wheezes. She has no rales.  Abdominal: Soft. She exhibits no distension.  Musculoskeletal:       Right lower leg: She exhibits edema (trace pitting). She exhibits no tenderness.       Left lower leg: She exhibits no tenderness and no edema.  Neurological: She is  alert and oriented to person, place, and time.  Skin: Skin is warm and dry.  Psychiatric: She has a normal mood and affect. Her behavior is normal.  Nursing note and vitals reviewed.    Assessment & Plan:  1: Chronic heart failure with preserved ejection fraction- - NYHA class II - euvolemic today - doesn't have scales so a set was given to her today; instructed her to call for an overnight weight gain of >2 pounds or a weekly weight gain of >5 pounds - not adding salt to her food after it's cooked but does cook with it at times; reviewed the importance of not adding any salt and to read food labels so that she can keep her daily sodium content to 2000mg  / day. Written information given to her about this - saw cardiology Juliann Pares) 02/07/18 - discussed the recommendation to stop the actos due to her having HF; she will discuss with PCP next week - BNP 02/01/18 was 208.0 - PharmD reconciled medications  - she is unsure if she's received her flu vaccine for this season or not  2: HTN- - BP looks good today - saw PCP Merlinda Frederick) 01/30/18 - BMP done 02/02/18 reviewed and showed sodium 134, potassium 3.6, creatinine 0.73 and GFR >60  3: DM- - A1c 01/30/18 was 7.8% - glucose at home today was 156 - again, she will discuss stopping actos with her PCP next week  Patient did not bring her medications nor a list. Each medication was verbally reviewed with the patient and she was encouraged to bring the bottles to every visit to confirm accuracy of list.  Return in 6 weeks or sooner for any questions/problems before then.

## 2018-04-21 ENCOUNTER — Emergency Department
Admission: EM | Admit: 2018-04-21 | Discharge: 2018-04-21 | Disposition: A | Discharge status: Home / Self Care | Payer: Medicare Other | Attending: Emergency Medicine | Admitting: Emergency Medicine

## 2018-04-21 ENCOUNTER — Encounter: Payer: Self-pay | Admitting: Emergency Medicine

## 2018-04-21 ENCOUNTER — Other Ambulatory Visit: Payer: Self-pay

## 2018-04-21 DIAGNOSIS — Z79899 Other long term (current) drug therapy: Secondary | ICD-10-CM | POA: Diagnosis not present

## 2018-04-21 DIAGNOSIS — E039 Hypothyroidism, unspecified: Secondary | ICD-10-CM | POA: Diagnosis not present

## 2018-04-21 DIAGNOSIS — E871 Hypo-osmolality and hyponatremia: Secondary | ICD-10-CM | POA: Insufficient documentation

## 2018-04-21 DIAGNOSIS — Z955 Presence of coronary angioplasty implant and graft: Secondary | ICD-10-CM | POA: Insufficient documentation

## 2018-04-21 DIAGNOSIS — R202 Paresthesia of skin: Secondary | ICD-10-CM | POA: Insufficient documentation

## 2018-04-21 DIAGNOSIS — Z7902 Long term (current) use of antithrombotics/antiplatelets: Secondary | ICD-10-CM | POA: Diagnosis not present

## 2018-04-21 DIAGNOSIS — Z7982 Long term (current) use of aspirin: Secondary | ICD-10-CM | POA: Insufficient documentation

## 2018-04-21 DIAGNOSIS — I5032 Chronic diastolic (congestive) heart failure: Secondary | ICD-10-CM | POA: Diagnosis not present

## 2018-04-21 DIAGNOSIS — Z7984 Long term (current) use of oral hypoglycemic drugs: Secondary | ICD-10-CM | POA: Insufficient documentation

## 2018-04-21 DIAGNOSIS — I2510 Atherosclerotic heart disease of native coronary artery without angina pectoris: Secondary | ICD-10-CM | POA: Insufficient documentation

## 2018-04-21 DIAGNOSIS — I110 Hypertensive heart disease with heart failure: Secondary | ICD-10-CM | POA: Diagnosis present

## 2018-04-21 DIAGNOSIS — E119 Type 2 diabetes mellitus without complications: Secondary | ICD-10-CM | POA: Diagnosis not present

## 2018-04-21 DIAGNOSIS — I10 Essential (primary) hypertension: Secondary | ICD-10-CM

## 2018-04-21 LAB — BASIC METABOLIC PANEL
Anion gap: 9 (ref 5–15)
BUN: 16 mg/dL (ref 8–23)
CALCIUM: 9.2 mg/dL (ref 8.9–10.3)
CO2: 25 mmol/L (ref 22–32)
CREATININE: 0.89 mg/dL (ref 0.44–1.00)
Chloride: 93 mmol/L — ABNORMAL LOW (ref 98–111)
GFR calc Af Amer: >60 mL/min (ref >60)
GLUCOSE: 90 mg/dL (ref 70–99)
Potassium: 4.2 mmol/L (ref 3.5–5.1)
Sodium: 127 mmol/L — ABNORMAL LOW (ref 135–145)

## 2018-04-21 LAB — URINALYSIS, COMPLETE (UACMP) WITH MICROSCOPIC
Bacteria, UA: NONE SEEN
Bilirubin Urine: NEGATIVE
Glucose, UA: 150 mg/dL — AB
Hgb urine dipstick: NEGATIVE
Ketones, ur: NEGATIVE mg/dL
Nitrite: NEGATIVE
Protein, ur: NEGATIVE mg/dL
Specific Gravity, Urine: 1.004 — ABNORMAL LOW (ref 1.005–1.030)
pH: 7 (ref 5.0–8.0)

## 2018-04-21 LAB — CBC
HCT: 39.1 % (ref 36.0–46.0)
Hemoglobin: 13.1 g/dL (ref 12.0–15.0)
MCH: 30.7 pg (ref 26.0–34.0)
MCHC: 33.5 g/dL (ref 30.0–36.0)
MCV: 91.6 fL (ref 80.0–100.0)
Platelets: 406 10*3/uL — ABNORMAL HIGH (ref 150–400)
RBC: 4.27 MIL/uL (ref 3.87–5.11)
RDW: 12.7 % (ref 11.5–15.5)
WBC: 6.7 10*3/uL (ref 4.0–10.5)
nRBC: 0 % (ref 0.0–0.2)

## 2018-04-21 LAB — GLUCOSE, CAPILLARY
Glucose-Capillary: 80 mg/dL (ref 70–99)
Glucose-Capillary: 112 mg/dL — ABNORMAL HIGH (ref 70–99)

## 2018-04-21 MED ORDER — SODIUM CHLORIDE 0.9 % IV BOLUS
500.0000 mL | Freq: Once | INTRAVENOUS | Status: AC
  Administered 2018-04-21: 500 mL via INTRAVENOUS

## 2018-04-21 NOTE — ED Notes (Signed)
Pt is awake alert and oriented, denies any needs at present. Pt reports she ate some graham crackers and drank a can of ginger ale.

## 2018-04-21 NOTE — ED Notes (Signed)
Introduced to patient, assisted to bathroom to provide UA, pt undressed and placed on cardiac, SPo2 and NIBP monitoring. Call bell within reach, VSS at this time. Provided with a warm blanket. No needs expressed at this time. Awaits provider for evaluation.

## 2018-04-21 NOTE — ED Notes (Signed)
Assisted pt to bedside toilet, pt ambulated with minimal assistance, pt received phone call from husband, pt wanted to know if she was going to go home because her husband needs to come to pick her up because he uses [employer] and will not be able to have a ride late at night, RN acknowledge pt's concerns and informed will make Dr. Derrill KayGoodman aware

## 2018-04-21 NOTE — ED Notes (Signed)
Received report from [place], RN care  Assumed at this time

## 2018-04-21 NOTE — ED Notes (Signed)
Pt assisted to the bathroom, walked with minimal assistance. Assisted back in bed and call bell is within reach.

## 2018-04-21 NOTE — Discharge Instructions (Signed)
It is very important that you contact your primary care doctor to get your sodium level rechecked. Please increase your sodium c

## 2018-04-21 NOTE — ED Notes (Signed)
Dr. Goodman at bedside.  

## 2018-04-21 NOTE — ED Notes (Signed)
Pts BG checked per her request, POC BG result 112. Pt requesting something to ea and drink, provider notified.

## 2018-04-21 NOTE — ED Triage Notes (Addendum)
PT to ED via POV with multiple complaints.  c/o HTN of 180/80 and 201/88 at home this afternoon. PT states she then took Asprin. Denies CP . PT also c/o being cold and shaky, states glucose is down at 115. PT also states bilat hand are tingling and shaky. Speech clear, NAD noted  PT states she took HTN meds today.

## 2018-04-21 NOTE — ED Notes (Signed)
Pt verbalizes understanding of discharge instructions.

## 2018-04-21 NOTE — ED Provider Notes (Signed)
[facility] Emergency Department Provider Note  ____________________________________________   I have reviewed the triage vital signs and the nursing notes.   HISTORY  Chief Complaint Hypertension   History limited by: Not Limited   HPI Christine Zimmerman is a 72 y.o. female who presents to the emergency department today with primary complaint of elevated blood pressure. She states that she checks her blood pressure and sugar levels daily. Today she noticed her blood pressure was elevated. The patient states that she did take her medication as prescribed. She had seen her primary care physician on Monday and had blood work done. Was told she was hyponatremic and to drink gatorades which she has done. In addition to the elevated blood pressure she is also complaining of some tingling in her extremities. At the time of my exam she states she is feeling slightly improved.    Per medical record review patient has a history of atrial fibrillation, CHF, DM, GERD.   Past Medical History:  Diagnosis Date  . Atrial fibrillation (HCC)   . CHF (congestive heart failure) (HCC)   . Chronic pain   . Coronary artery disease   . Diabetes mellitus without complication (HCC)   . GERD (gastroesophageal reflux disease)   . High cholesterol   . Hypertension   . Hypothyroidism     Patient Active Problem List   Diagnosis Date Noted  . Chronic diastolic heart failure (HCC) 03/23/2018  . HTN (hypertension) 03/23/2018  . DM (diabetes mellitus) (HCC) 03/23/2018  . SOB (shortness of breath) 02/01/2018  . Dizziness 08/01/2017  . TIA (transient ischemic attack) 10/17/2015  . Hyponatremia 10/17/2015  . Anemia 10/17/2015  . Chest pain 10/16/2015  . Left-sided weakness 10/16/2015  . Hypotension 10/16/2015  . Bradycardia 10/16/2015  . Hypoglycemia 10/16/2015    Past Surgical History:  Procedure Laterality Date  . BACK SURGERY    . CARDIAC CATHETERIZATION Left 10/23/2015   Procedure: Left Heart Cath and Coronary Angiography;  Surgeon: Alwyn Pea, MD;  Location: [facility] INVASIVE CV LAB;  Service: Cardiovascular;  Laterality: Left;  . COLONOSCOPY WITH PROPOFOL N/A 01/03/2017   Procedure: COLONOSCOPY WITH PROPOFOL;  Surgeon: Scot Jun, MD;  Location: [facility] ENDOSCOPY;  Service: Endoscopy;  Laterality: N/A;  . CORONARY STENT PLACEMENT     x 4 at [facility]. x 1 by [facility] in 2004  . LEFT HEART CATH AND CORONARY ANGIOGRAPHY N/A 06/08/2017   Procedure: LEFT HEART CATH AND CORONARY ANGIOGRAPHY;  Surgeon: Alwyn Pea, MD;  Location: [facility] INVASIVE CV LAB;  Service: Cardiovascular;  Laterality: N/A;    Prior to Admission medications   Medication Sig Start Date End Date Taking? Authorizing Provider  aspirin EC 81 MG tablet Take 81 mg by mouth daily.    [provider]  atorvastatin (LIPITOR) 80 MG tablet Take 80 mg by mouth daily.    [provider]  Biotin w/ Vitamins C & E (HAIR/SKIN/NAILS PO) Take 1 tablet by mouth daily.    [provider]  chlorthalidone (HYGROTON) 25 MG tablet Take 12.5 mg by mouth every evening.     [provider]  Cholecalciferol (VITAMIN D3) 1000 units CAPS Take 1,000 Units by mouth 2 (two) times daily.    [provider]  clopidogrel (PLAVIX) 75 MG tablet Take 75 mg by mouth daily.    [provider]  ferrous sulfate 325 (65 FE) MG tablet Take 325 mg by mouth daily with breakfast.     [provider]  furosemide (LASIX) 20 MG tablet Take 20 mg by mouth daily as needed for edema.    [provider]  gabapentin (NEURONTIN) 300 MG capsule Take 600 mg by mouth 3 (three) times daily.     [provider]  glipiZIDE (GLUCOTROL XL) 5 MG 24 hr tablet Take 1 tablet (5 mg total) by mouth 2 (two) times daily. Patient taking differently: Take 10 mg by mouth 2 (two) times daily.  08/03/17   Altamese Dilling, MD  isosorbide mononitrate (IMDUR) 30 MG 24 hr tablet  Take 30 mg by mouth 2 (two) times daily.     [provider]  levothyroxine (SYNTHROID, LEVOTHROID) 50 MCG tablet Take 50 mcg by mouth daily before breakfast.     [provider]  lisinopril (PRINIVIL,ZESTRIL) 20 MG tablet Take 20 mg by mouth daily.    [provider]  meclizine (ANTIVERT) 25 MG tablet Take 25 mg by mouth 3 (three) times daily as needed for dizziness.    [provider]  metFORMIN (GLUCOPHAGE) 1000 MG tablet Take 1,000 mg by mouth 2 (two) times daily with a meal.    [provider]  niacin 500 MG tablet Take 500 mg by mouth 2 (two) times daily.     [provider]  nitroGLYCERIN (NITROLINGUAL) 0.4 MG/SPRAY spray Place 1 spray under the tongue every 5 (five) minutes x 3 doses as needed for chest pain.    [provider]  pantoprazole (PROTONIX) 40 MG tablet Take 40 mg by mouth every evening.     [provider]  pioglitazone (ACTOS) 15 MG tablet Take 15 mg by mouth daily.    [provider]  potassium chloride (K-DUR,KLOR-CON) 10 MEQ tablet Take 10 mEq by mouth daily as needed (with furosemide).    [provider]  tiZANidine (ZANAFLEX) 2 MG tablet Take 4 mg by mouth at bedtime.     [provider]    Allergies Biaxin [clarithromycin]; Homatropine; Januvia [sitagliptin]; Macrodantin [nitrofurantoin macrocrystal]; Naproxen; Propoxyphene; Tramadol; and Penicillins  Family History  Problem Relation Age of Onset  . Breast cancer Neg Hx     Social History Social History   Tobacco Use  . Smoking status: Never Smoker  . Smokeless tobacco: Never Used  Substance Use Topics  . Alcohol use: No  . Drug use: No    Review of Systems Constitutional: No fever/chills Eyes: No visual changes. ENT: No sore throat. Cardiovascular: Denies chest pain. Positive for high blood pressure.  Respiratory: Denies shortness of breath. Gastrointestinal: No abdominal pain.  No nausea, no vomiting.   No diarrhea.   Genitourinary: Negative for dysuria. Musculoskeletal: Negative for back pain. Skin: Negative for rash. Neurological: Positive for tingling to her extremities.  ____________________________________________   PHYSICAL EXAM:  VITAL SIGNS: ED Triage Vitals  Enc Vitals Group     BP 04/21/18 1610 (!) 194/62     Pulse --      Resp 04/21/18 1608 16     Temp 04/21/18 1608 98.1 F (36.7 C)     Temp Source 04/21/18 1608 Oral     SpO2 04/21/18 1608 99 %     Weight --      Height --      Head Circumference --      Peak Flow --      Pain Score 04/21/18 1608 0   Constitutional: Alert and oriented.  Eyes: Conjunctivae are normal.  ENT      Head: Normocephalic and atraumatic.  Nose: No congestion/rhinnorhea.      Mouth/Throat: Mucous membranes are moist.      Neck: No stridor. Hematological/Lymphatic/Immunilogical: No cervical lymphadenopathy. Cardiovascular: Normal rate, regular rhythm.  No murmurs, rubs, or gallops.  Respiratory: Normal respiratory effort without tachypnea nor retractions. Breath sounds are clear and equal bilaterally. No wheezes/rales/rhonchi. Gastrointestinal: Soft and non tender. No rebound. No guarding.  Genitourinary: Deferred Musculoskeletal: Normal range of motion in all extremities. No lower extremity edema. Neurologic:  Normal speech and language. No gross focal neurologic deficits are appreciated.  Skin:  Skin is warm, dry and intact. No rash noted. Psychiatric: Mood and affect are normal. Speech and behavior are normal. Patient exhibits appropriate insight and judgment.  ____________________________________________    LABS (pertinent positives/negatives)  CBC wbc 6.7, hgb 13.1, plt 406 BMP na 127, k 4.2, cl 93, glu 90, cr 0.89 UA clear, trace leukocytes, 0-5 wbc and rbc ____________________________________________   EKG  I, Phineas SemenGraydon Zilah Villaflor, attending physician, personally viewed and interpreted this EKG  EKG Time: 1615 Rate:  49 Rhythm: sinus bradycardia Axis: normal Intervals: qtc 437 QRS: RBBB ST changes: no st elevation Impression: abnormal ekg ____________________________________________    RADIOLOGY  None  ____________________________________________   PROCEDURES  Procedures  ____________________________________________   INITIAL IMPRESSION / ASSESSMENT AND PLAN / ED COURSE  Pertinent labs & imaging results that were available during my care of the patient were reviewed by me and considered in my medical decision making (see chart for details).   Patient presented to the emergency department today with primary concern for high blood pressure.  The patient's blood pressure was slightly elevated here in the emergency department although did improve during her stay.  Of note the patient was found to be hyponatremic.  This is not a new finding for the patient.  She was given IV fluids to help increase her sodium and she stated that she did feel better afterwards.  I did discuss importance of the patient to increase her sodium content for the next couple of days.  Did discuss with patient importance of following up with primary care early next week for repeat.  We did discuss return precautions.  ____________________________________________   FINAL CLINICAL IMPRESSION(S) / ED DIAGNOSES  Final diagnoses:  Hypertension, unspecified type  Hyponatremia     Note: This dictation was prepared with Dragon dictation. Any transcriptional errors that result from this process are unintentional     Phineas SemenGoodman, Ritesh Opara, MD 04/21/18 2048

## 2018-04-21 NOTE — ED Notes (Signed)
Pt provided with diet ginger ale and crackers. Call bell remains within reach. VSS.

## 2018-05-04 ENCOUNTER — Ambulatory Visit: Payer: Medicare Other | Admitting: Family

## 2018-05-18 ENCOUNTER — Other Ambulatory Visit
Admission: RE | Admit: 2018-05-18 | Discharge: 2018-05-18 | Disposition: A | Discharge status: Home / Self Care | Payer: Medicare Other | Source: Ambulatory Visit | Attending: Family Medicine | Admitting: Family Medicine

## 2018-05-18 DIAGNOSIS — M62838 Other muscle spasm: Secondary | ICD-10-CM | POA: Diagnosis present

## 2018-05-18 LAB — CK: Total CK: 94 U/L (ref 38–234)

## 2018-06-07 ENCOUNTER — Ambulatory Visit: Payer: Medicare Other | Attending: Family | Admitting: Family

## 2018-06-07 ENCOUNTER — Encounter: Payer: Self-pay | Admitting: Family

## 2018-06-07 VITALS — BP 144/63 | HR 49 | Resp 18 | Ht 60.0 in | Wt 172.2 lb

## 2018-06-07 DIAGNOSIS — Z886 Allergy status to analgesic agent status: Secondary | ICD-10-CM | POA: Insufficient documentation

## 2018-06-07 DIAGNOSIS — E039 Hypothyroidism, unspecified: Secondary | ICD-10-CM | POA: Diagnosis not present

## 2018-06-07 DIAGNOSIS — E119 Type 2 diabetes mellitus without complications: Secondary | ICD-10-CM

## 2018-06-07 DIAGNOSIS — I2510 Atherosclerotic heart disease of native coronary artery without angina pectoris: Secondary | ICD-10-CM | POA: Diagnosis not present

## 2018-06-07 DIAGNOSIS — Z881 Allergy status to other antibiotic agents status: Secondary | ICD-10-CM | POA: Insufficient documentation

## 2018-06-07 DIAGNOSIS — I10 Essential (primary) hypertension: Secondary | ICD-10-CM

## 2018-06-07 DIAGNOSIS — Z7982 Long term (current) use of aspirin: Secondary | ICD-10-CM | POA: Insufficient documentation

## 2018-06-07 DIAGNOSIS — Z7984 Long term (current) use of oral hypoglycemic drugs: Secondary | ICD-10-CM | POA: Diagnosis not present

## 2018-06-07 DIAGNOSIS — Z888 Allergy status to other drugs, medicaments and biological substances status: Secondary | ICD-10-CM | POA: Diagnosis not present

## 2018-06-07 DIAGNOSIS — K219 Gastro-esophageal reflux disease without esophagitis: Secondary | ICD-10-CM | POA: Insufficient documentation

## 2018-06-07 DIAGNOSIS — I110 Hypertensive heart disease with heart failure: Secondary | ICD-10-CM | POA: Insufficient documentation

## 2018-06-07 DIAGNOSIS — E7800 Pure hypercholesterolemia, unspecified: Secondary | ICD-10-CM | POA: Diagnosis not present

## 2018-06-07 DIAGNOSIS — I4891 Unspecified atrial fibrillation: Secondary | ICD-10-CM | POA: Diagnosis not present

## 2018-06-07 DIAGNOSIS — Z955 Presence of coronary angioplasty implant and graft: Secondary | ICD-10-CM | POA: Insufficient documentation

## 2018-06-07 DIAGNOSIS — I5032 Chronic diastolic (congestive) heart failure: Secondary | ICD-10-CM | POA: Diagnosis present

## 2018-06-07 DIAGNOSIS — Z79890 Hormone replacement therapy (postmenopausal): Secondary | ICD-10-CM | POA: Diagnosis not present

## 2018-06-07 DIAGNOSIS — G8929 Other chronic pain: Secondary | ICD-10-CM | POA: Insufficient documentation

## 2018-06-07 DIAGNOSIS — Z885 Allergy status to narcotic agent status: Secondary | ICD-10-CM | POA: Insufficient documentation

## 2018-06-07 DIAGNOSIS — Z79899 Other long term (current) drug therapy: Secondary | ICD-10-CM | POA: Diagnosis not present

## 2018-06-07 DIAGNOSIS — Z880 Allergy status to penicillin: Secondary | ICD-10-CM | POA: Insufficient documentation

## 2018-06-07 NOTE — Progress Notes (Signed)
Patient ID: Carl BestShobhna J Chamberlin, female    DOB: 11/26/1945, 73 y.o.   MRN: [id number]  HPI  Ms Hadley Penanna is a 73 y/o female with a history of CAD, Dm, hyperlipidemia, HTN, thyroid disease, GERD, chronic pain, atrial fibrillation and chronic heart failure.   Echo report from 02/01/18 reviewed and showed an EF of 60-65%.  Cardiac catheterization done 06/08/17 and showed EF 55-65%,mild to moderate CAD and widely patent stents in circumflex and RCA.   Was in the ED 04/21/18 due to HTN. IV fluids were given and she was released. Admitted 02/01/18 due to acute new onset HF. Initially received IV lasix and then transitioned to PRN lasix. Cardiology consult obtained and she was discharged the following day.   She presents today for a follow-up visit with a chief complaint of minimal shortness of breath upon moderate exertion. She describes this as chronic in nature having been present for several years. She has associated fatigue, headaches and difficulty sleeping along with this. She denies any dizziness, abdominal distention, palpitations, pedal edema, chest pain or weight gain. Reports feeling under a lot of stress having to take care of her husband and her mentally challenged son. She was in the ED last night with her son and didn't get much sleep last night. Would like to see if there's any assistance that she can get to help with chores at home.   Past Medical History:  Diagnosis Date  . Atrial fibrillation (HCC)   . CHF (congestive heart failure) (HCC)   . Chronic pain   . Coronary artery disease   . Diabetes mellitus without complication (HCC)   . GERD (gastroesophageal reflux disease)   . High cholesterol   . Hypertension   . Hypothyroidism    Past Surgical History:  Procedure Laterality Date  . BACK SURGERY    . CARDIAC CATHETERIZATION Left 10/23/2015   Procedure: Left Heart Cath and Coronary Angiography;  Surgeon: Alwyn Peawayne D Callwood, MD;  Location: [facility] INVASIVE CV [facility];  Service: Cardiovascular;   Laterality: Left;  . COLONOSCOPY WITH PROPOFOL N/A 01/03/2017   Procedure: COLONOSCOPY WITH PROPOFOL;  Surgeon: Scot JunElliott, Robert T, MD;  [facility];  Service: Endoscopy;  Laterality: N/A;  . CORONARY STENT PLACEMENT     [facility]  . LEFT HEART CATH AND CORONARY ANGIOGRAPHY N/A 06/08/2017   Procedure: LEFT HEART CATH AND CORONARY ANGIOGRAPHY;  Surgeon: Alwyn Peaallwood, Dwayne D, MD;  Location: [facility] INVASIVE CV [facility];  Service: Cardiovascular;  Laterality: N/A;   Family History  Problem Relation Age of Onset  . Breast cancer Neg Hx    Social History   Tobacco Use  . Smoking status: Never Smoker  . Smokeless tobacco: Never Used  Substance Use Topics  . Alcohol use: No   Allergies  Allergen Reactions  . Biaxin [Clarithromycin] Hives  . Homatropine Other (See Comments)    Hycodan  . Januvia [Sitagliptin] Other (See Comments)    Unknown  . Macrodantin [Nitrofurantoin Macrocrystal] Other (See Comments)    Unknown  . Naproxen Other (See Comments)    Unknown  . Propoxyphene Other (See Comments)    Darvocet  . Tramadol Other (See Comments)    Unknown  . Penicillins Rash and Other (See Comments)    Has patient had a PCN reaction causing immediate rash, facial/tongue/throat swelling, SOB or lightheadedness with hypotension: No Has patient had a PCN reaction causing severe rash involving mucus membranes or skin necrosis: No Has  patient had a PCN reaction that required hospitalization: Yes Has patient had a PCN reaction occurring within the last 10 years: Unknown If all of the above answers are "NO", then may proceed with Cephalosporin use.    Prior to Admission medications   Medication Sig Start Date End Date Taking? Authorizing Provider  amLODipine (NORVASC) 5 MG tablet Take 5 mg by mouth daily.   Yes [provider]  aspirin EC 81 MG tablet Take 81 mg by mouth daily.   Yes [provider]  atorvastatin (LIPITOR) 80 MG tablet Take  80 mg by mouth daily.   Yes [provider]  clopidogrel (PLAVIX) 75 MG tablet Take 75 mg by mouth daily.   Yes [provider]  diclofenac sodium (VOLTAREN) 1 % GEL Apply 2 g topically 4 (four) times daily.   Yes [provider]  empagliflozin (JARDIANCE) 10 MG TABS tablet Take 10 mg by mouth daily.   Yes [provider]  ferrous sulfate 325 (65 FE) MG tablet Take 1 tablet by mouth daily. 11/10/16  Yes [provider]  furosemide (LASIX) 20 MG tablet Take 20 mg by mouth daily as needed for edema.   Yes [provider]  gabapentin (NEURONTIN) 300 MG capsule Take 600 mg by mouth 3 (three) times daily.    Yes [provider]  glipiZIDE (GLUCOTROL XL) 5 MG 24 hr tablet Take 1 tablet (5 mg total) by mouth 2 (two) times daily. Patient taking differently: Take 5 mg by mouth daily with breakfast.  08/03/17  Yes Altamese DillingVachhani, Vaibhavkumar, MD  isosorbide mononitrate (IMDUR) 30 MG 24 hr tablet Take 30 mg by mouth 2 (two) times daily.    Yes [provider]  levothyroxine (SYNTHROID, LEVOTHROID) 50 MCG tablet Take 50 mcg by mouth daily before breakfast.    Yes [provider]  metFORMIN (GLUCOPHAGE) 1000 MG tablet Take 1,000 mg by mouth 2 (two) times daily with a meal.   Yes [provider]  niacin 500 MG tablet Take 500 mg by mouth 2 (two) times daily.    Yes [provider]  nitroGLYCERIN (NITROLINGUAL) 0.4 MG/SPRAY spray Place 1 spray under the tongue every 5 (five) minutes x 3 doses as needed for chest pain.   Yes [provider]  olmesartan (BENICAR) 40 MG tablet Take 40 mg by mouth daily.   Yes [provider]  pantoprazole (PROTONIX) 40 MG tablet Take 40 mg by mouth every evening.    Yes [provider]  potassium chloride (K-DUR,KLOR-CON) 10 MEQ tablet Take 10 mEq by mouth daily as needed (with furosemide).   Yes [provider]  tiZANidine (ZANAFLEX) 2 MG tablet Take 4 mg  by mouth at bedtime.    Yes [provider]  Biotin w/ Vitamins C & E (HAIR/SKIN/NAILS PO) Take 1 tablet by mouth daily.    [provider]  chlorthalidone (HYGROTON) 25 MG tablet Take 12.5 mg by mouth every evening.     [provider]  lisinopril (PRINIVIL,ZESTRIL) 20 MG tablet Take 20 mg by mouth daily.    [provider]  meclizine (ANTIVERT) 25 MG tablet Take 25 mg by mouth 3 (three) times daily as needed for dizziness.    [provider]    Review of Systems  Constitutional: Positive for fatigue. Negative for appetite change.  HENT: Negative for congestion, postnasal drip and sore throat.   Eyes: Negative.   Respiratory: Positive for shortness of breath. Negative for chest tightness.  Cardiovascular: Negative for chest pain, palpitations and leg swelling.  Gastrointestinal: Negative for abdominal distention and abdominal pain.  Endocrine: Negative.   Genitourinary: Negative.   Musculoskeletal: Positive for arthralgias (legs hurt at times). Negative for neck pain.  Skin: Negative.   Allergic/Immunologic: Negative.   Neurological: Positive for headaches.  Hematological: Negative for adenopathy. Does not bruise/bleed easily.  Psychiatric/Behavioral: Positive for sleep disturbance (off and on). Negative for decreased concentration. The patient is not nervous/anxious.    Vitals:   06/07/18 1012  BP: (!) 144/63  Pulse: (!) 49  Resp: 18  SpO2: 100%  Weight: 172 lb 4 oz (78.1 kg)  Height: 5' (1.524 m)   Wt Readings from Last 3 Encounters:  06/07/18 172 lb 4 oz (78.1 kg)  04/21/18 171 lb (77.6 kg)  03/23/18 171 lb 8 oz (77.8 kg)   Lab Results  Component Value Date   CREATININE 0.89 04/21/2018   CREATININE 0.73 02/02/2018   CREATININE 0.77 02/01/2018    Physical Exam Vitals signs and nursing note reviewed.  Constitutional:      Appearance: She is well-developed.  HENT:     Head: Normocephalic and atraumatic.  Neck:      Musculoskeletal: Normal range of motion and neck supple.     Vascular: No JVD.  Cardiovascular:     Rate and Rhythm: Regular rhythm. Bradycardia present.  Pulmonary:     Effort: Pulmonary effort is normal. No respiratory distress.     Breath sounds: No wheezing or rales.  Abdominal:     General: There is no distension.     Palpations: Abdomen is soft.  Musculoskeletal:     Right lower leg: She exhibits no tenderness. No edema.     Left lower leg: She exhibits no tenderness. No edema.  Skin:    General: Skin is warm and dry.  Neurological:     Mental Status: She is alert and oriented to person, place, and time.  Psychiatric:        Behavior: Behavior normal.     Assessment & Plan:  1: Chronic heart failure with preserved ejection fraction- - NYHA class II - euvolemic today - weighing daily; reminded her to call for an overnight weight gain of >2 pounds or a weekly weight gain of >5 pounds - weight up 1 pound from last visit here 2 months ago - not adding salt to her food after it's cooked but does cook with it at times; reviewed the importance of not adding any salt and to read food labels so that she can keep her daily sodium content to 2000mg  / day.  - saw cardiology Juliann Pares) 05/22/2018 - BNP 02/01/18 was 208.0 - she says that she's received her flu vaccine for this season  2: HTN- - BP looks good today - saw PCP Merlinda Frederick) 06/07/2018; encouraged her to speak with her PCP about any home health assistance - BMP done 05/18/2018 reviewed and showed sodium 138, potassium 4.8, creatinine 0.9 and GFR 62  3: DM- - A1c 03/27/18 was 7.5% - glucose at home today was 149 - actos has been stopped  Medication list was reviewed with the patient.   Will not make a return appointment at this time. Instructed her to follow closely with her PCP and cardiologist but that she could call back at anytime to make another appointment.

## 2018-06-07 NOTE — Patient Instructions (Signed)
Continue weighing daily and call for an overnight weight gain of > 2 pounds or a weekly weight gain of >5 pounds. 

## 2018-08-28 ENCOUNTER — Other Ambulatory Visit: Payer: Self-pay | Admitting: Physician Assistant

## 2018-08-28 DIAGNOSIS — Z1231 Encounter for screening mammogram for malignant neoplasm of breast: Secondary | ICD-10-CM

## 2019-02-15 ENCOUNTER — Other Ambulatory Visit: Payer: Self-pay | Admitting: Internal Medicine

## 2019-02-15 ENCOUNTER — Ambulatory Visit
Admission: RE | Admit: 2019-02-15 | Discharge: 2019-02-15 | Disposition: A | Discharge status: Home / Self Care | Payer: Medicare Other | Source: Ambulatory Visit | Attending: Internal Medicine | Admitting: Internal Medicine

## 2019-02-15 ENCOUNTER — Encounter (INDEPENDENT_AMBULATORY_CARE_PROVIDER_SITE_OTHER): Payer: Self-pay

## 2019-02-15 ENCOUNTER — Other Ambulatory Visit: Payer: Self-pay

## 2019-02-15 DIAGNOSIS — R600 Localized edema: Secondary | ICD-10-CM | POA: Diagnosis not present

## 2019-02-16 ENCOUNTER — Other Ambulatory Visit: Payer: Self-pay | Admitting: Internal Medicine

## 2019-02-16 DIAGNOSIS — Z1231 Encounter for screening mammogram for malignant neoplasm of breast: Secondary | ICD-10-CM

## 2019-03-08 ENCOUNTER — Other Ambulatory Visit
Admission: RE | Admit: 2019-03-08 | Discharge: 2019-03-08 | Disposition: A | Discharge status: Home / Self Care | Payer: Medicare Other | Source: Ambulatory Visit | Attending: Internal Medicine | Admitting: Internal Medicine

## 2019-03-08 DIAGNOSIS — I208 Other forms of angina pectoris: Secondary | ICD-10-CM | POA: Insufficient documentation

## 2019-03-08 LAB — CBC WITH DIFFERENTIAL/PLATELET
Abs Immature Granulocytes: 0.02 10*3/uL (ref 0.00–0.07)
Basophils Absolute: 0 10*3/uL (ref 0.0–0.1)
Basophils Relative: 0 %
Eosinophils Absolute: 0.2 10*3/uL (ref 0.0–0.5)
Eosinophils Relative: 3 %
HCT: 41.1 % (ref 36.0–46.0)
Hemoglobin: 13.6 g/dL (ref 12.0–15.0)
Immature Granulocytes: 0 %
Lymphocytes Relative: 19 %
Lymphs Abs: 1.4 10*3/uL (ref 0.7–4.0)
MCH: 30.6 pg (ref 26.0–34.0)
MCHC: 33.1 g/dL (ref 30.0–36.0)
MCV: 92.6 fL (ref 80.0–100.0)
Monocytes Absolute: 0.7 10*3/uL (ref 0.1–1.0)
Monocytes Relative: 10 %
Neutro Abs: 4.8 10*3/uL (ref 1.7–7.7)
Neutrophils Relative %: 68 %
Platelets: 443 10*3/uL — ABNORMAL HIGH (ref 150–400)
RBC: 4.44 MIL/uL (ref 3.87–5.11)
RDW: 13.3 % (ref 11.5–15.5)
WBC: 7.1 10*3/uL (ref 4.0–10.5)
nRBC: 0 % (ref 0.0–0.2)

## 2019-03-08 LAB — BRAIN NATRIURETIC PEPTIDE: B Natriuretic Peptide: 155 pg/mL — ABNORMAL HIGH (ref 0.0–100.0)

## 2019-03-20 ENCOUNTER — Other Ambulatory Visit: Payer: Self-pay | Admitting: Internal Medicine

## 2019-03-26 ENCOUNTER — Other Ambulatory Visit: Payer: Self-pay | Admitting: Internal Medicine

## 2019-03-26 DIAGNOSIS — I208 Other forms of angina pectoris: Secondary | ICD-10-CM

## 2019-03-28 ENCOUNTER — Ambulatory Visit
Admission: RE | Admit: 2019-03-28 | Discharge: 2019-03-28 | Disposition: A | Discharge status: Home / Self Care | Payer: Medicare Other | Source: Ambulatory Visit | Attending: Internal Medicine | Admitting: Internal Medicine

## 2019-03-28 DIAGNOSIS — Z1231 Encounter for screening mammogram for malignant neoplasm of breast: Secondary | ICD-10-CM | POA: Diagnosis present

## 2019-04-04 ENCOUNTER — Other Ambulatory Visit: Payer: Medicare Other

## 2019-04-10 ENCOUNTER — Other Ambulatory Visit: Payer: Self-pay

## 2019-04-10 ENCOUNTER — Ambulatory Visit
Admission: RE | Admit: 2019-04-10 | Discharge: 2019-04-10 | Disposition: A | Discharge status: Home / Self Care | Payer: Medicare Other | Source: Ambulatory Visit | Attending: Internal Medicine | Admitting: Internal Medicine

## 2019-04-10 DIAGNOSIS — I208 Other forms of angina pectoris: Secondary | ICD-10-CM | POA: Diagnosis present

## 2019-04-10 MED ORDER — IOHEXOL 350 MG/ML SOLN
75.0000 mL | Freq: Once | INTRAVENOUS | Status: AC | PRN
  Administered 2019-04-10: 75 mL via INTRAVENOUS

## 2019-05-08 ENCOUNTER — Inpatient Hospital Stay
Admission: EM | Admit: 2019-05-08 | Discharge: 2019-05-09 | DRG: 641 | Discharge status: Against Medical Advice | Payer: Medicare Other | Attending: Internal Medicine | Admitting: Internal Medicine

## 2019-05-08 ENCOUNTER — Other Ambulatory Visit: Payer: Self-pay

## 2019-05-08 ENCOUNTER — Emergency Department: Payer: Medicare Other

## 2019-05-08 DIAGNOSIS — Z79899 Other long term (current) drug therapy: Secondary | ICD-10-CM

## 2019-05-08 DIAGNOSIS — I10 Essential (primary) hypertension: Secondary | ICD-10-CM | POA: Diagnosis present

## 2019-05-08 DIAGNOSIS — E871 Hypo-osmolality and hyponatremia: Secondary | ICD-10-CM | POA: Diagnosis not present

## 2019-05-08 DIAGNOSIS — Z7984 Long term (current) use of oral hypoglycemic drugs: Secondary | ICD-10-CM

## 2019-05-08 DIAGNOSIS — Z20828 Contact with and (suspected) exposure to other viral communicable diseases: Secondary | ICD-10-CM | POA: Diagnosis present

## 2019-05-08 DIAGNOSIS — I5032 Chronic diastolic (congestive) heart failure: Secondary | ICD-10-CM | POA: Diagnosis present

## 2019-05-08 DIAGNOSIS — Z880 Allergy status to penicillin: Secondary | ICD-10-CM

## 2019-05-08 DIAGNOSIS — Z7902 Long term (current) use of antithrombotics/antiplatelets: Secondary | ICD-10-CM

## 2019-05-08 DIAGNOSIS — R531 Weakness: Secondary | ICD-10-CM

## 2019-05-08 DIAGNOSIS — M7989 Other specified soft tissue disorders: Secondary | ICD-10-CM | POA: Diagnosis present

## 2019-05-08 DIAGNOSIS — E119 Type 2 diabetes mellitus without complications: Secondary | ICD-10-CM | POA: Diagnosis present

## 2019-05-08 DIAGNOSIS — G8929 Other chronic pain: Secondary | ICD-10-CM | POA: Diagnosis present

## 2019-05-08 DIAGNOSIS — Z8673 Personal history of transient ischemic attack (TIA), and cerebral infarction without residual deficits: Secondary | ICD-10-CM

## 2019-05-08 DIAGNOSIS — I959 Hypotension, unspecified: Secondary | ICD-10-CM | POA: Diagnosis present

## 2019-05-08 DIAGNOSIS — R319 Hematuria, unspecified: Secondary | ICD-10-CM | POA: Diagnosis not present

## 2019-05-08 DIAGNOSIS — Z888 Allergy status to other drugs, medicaments and biological substances status: Secondary | ICD-10-CM

## 2019-05-08 DIAGNOSIS — K219 Gastro-esophageal reflux disease without esophagitis: Secondary | ICD-10-CM | POA: Diagnosis present

## 2019-05-08 DIAGNOSIS — I480 Paroxysmal atrial fibrillation: Secondary | ICD-10-CM | POA: Diagnosis present

## 2019-05-08 DIAGNOSIS — Z955 Presence of coronary angioplasty implant and graft: Secondary | ICD-10-CM

## 2019-05-08 DIAGNOSIS — Z7982 Long term (current) use of aspirin: Secondary | ICD-10-CM

## 2019-05-08 DIAGNOSIS — E860 Dehydration: Secondary | ICD-10-CM | POA: Diagnosis present

## 2019-05-08 DIAGNOSIS — E7800 Pure hypercholesterolemia, unspecified: Secondary | ICD-10-CM | POA: Diagnosis present

## 2019-05-08 DIAGNOSIS — I2510 Atherosclerotic heart disease of native coronary artery without angina pectoris: Secondary | ICD-10-CM | POA: Diagnosis present

## 2019-05-08 DIAGNOSIS — E039 Hypothyroidism, unspecified: Secondary | ICD-10-CM | POA: Diagnosis present

## 2019-05-08 DIAGNOSIS — I110 Hypertensive heart disease with heart failure: Secondary | ICD-10-CM | POA: Diagnosis present

## 2019-05-08 DIAGNOSIS — Z79890 Hormone replacement therapy (postmenopausal): Secondary | ICD-10-CM

## 2019-05-08 LAB — HEPATIC FUNCTION PANEL
ALT: 20 U/L (ref 0–44)
AST: 18 U/L (ref 15–41)
Albumin: 3.9 g/dL (ref 3.5–5.0)
Alkaline Phosphatase: 69 U/L (ref 38–126)
Bilirubin, Direct: 0.2 mg/dL (ref 0.0–0.2)
Indirect Bilirubin: 0.7 mg/dL (ref 0.3–0.9)
Total Bilirubin: 0.9 mg/dL (ref 0.3–1.2)
Total Protein: 6.7 g/dL (ref 6.5–8.1)

## 2019-05-08 LAB — URINALYSIS, COMPLETE (UACMP) WITH MICROSCOPIC
Bacteria, UA: NONE SEEN
Bilirubin Urine: NEGATIVE
Glucose, UA: 150 mg/dL — AB
Hgb urine dipstick: NEGATIVE
Ketones, ur: NEGATIVE mg/dL
Leukocytes,Ua: NEGATIVE
Nitrite: NEGATIVE
Protein, ur: NEGATIVE mg/dL
Specific Gravity, Urine: 1.002 — ABNORMAL LOW (ref 1.005–1.030)
Squamous Epithelial / HPF: NONE SEEN (ref 0–5)
pH: 6 (ref 5.0–8.0)

## 2019-05-08 LAB — CBC
HCT: 36.9 % (ref 36.0–46.0)
Hemoglobin: 13 g/dL (ref 12.0–15.0)
MCH: 30.8 pg (ref 26.0–34.0)
MCHC: 35.2 g/dL (ref 30.0–36.0)
MCV: 87.4 fL (ref 80.0–100.0)
Platelets: 396 10*3/uL (ref 150–400)
RBC: 4.22 MIL/uL (ref 3.87–5.11)
RDW: 12.3 % (ref 11.5–15.5)
WBC: 8 10*3/uL (ref 4.0–10.5)
nRBC: 0 % (ref 0.0–0.2)

## 2019-05-08 LAB — GLUCOSE, CAPILLARY
Glucose-Capillary: 70 mg/dL (ref 70–99)
Glucose-Capillary: 96 mg/dL (ref 70–99)
Glucose-Capillary: 80 mg/dL (ref 70–99)

## 2019-05-08 LAB — TROPONIN I (HIGH SENSITIVITY)
Troponin I (High Sensitivity): 6 ng/L (ref <18)
Troponin I (High Sensitivity): 10 ng/L (ref <18)

## 2019-05-08 LAB — BASIC METABOLIC PANEL
Anion gap: 12 (ref 5–15)
BUN: 10 mg/dL (ref 8–23)
CO2: 23 mmol/L (ref 22–32)
Calcium: 9 mg/dL (ref 8.9–10.3)
Chloride: 89 mmol/L — ABNORMAL LOW (ref 98–111)
Creatinine, Ser: 0.69 mg/dL (ref 0.44–1.00)
GFR calc Af Amer: >60 mL/min (ref >60)
GFR calc non Af Amer: >60 mL/min (ref >60)
Glucose, Bld: 76 mg/dL (ref 70–99)
Potassium: 3.7 mmol/L (ref 3.5–5.1)
Sodium: 124 mmol/L — ABNORMAL LOW (ref 135–145)

## 2019-05-08 MED ORDER — IOHEXOL 350 MG/ML SOLN
100.0000 mL | Freq: Once | INTRAVENOUS | Status: AC | PRN
  Administered 2019-05-08: 22:00:00 100 mL via INTRAVENOUS

## 2019-05-08 MED ORDER — FUROSEMIDE 10 MG/ML IJ SOLN
40.0000 mg | Freq: Once | INTRAMUSCULAR | Status: AC
  Administered 2019-05-08: 40 mg via INTRAVENOUS
  Filled 2019-05-08: qty 4

## 2019-05-08 MED ORDER — SODIUM CHLORIDE 0.9% FLUSH: 3.0000 mL | Freq: Once | INTRAVENOUS | Status: DC

## 2019-05-08 MED ORDER — ONDANSETRON HCL 4 MG/2ML IJ SOLN
4.0000 mg | Freq: Once | INTRAMUSCULAR | Status: AC
  Administered 2019-05-08: 23:00:00 4 mg via INTRAVENOUS
  Filled 2019-05-08: qty 2

## 2019-05-08 MED ORDER — SODIUM CHLORIDE 0.9 % IV SOLN: Freq: Once | INTRAVENOUS | Status: AC

## 2019-05-08 NOTE — ED Notes (Signed)
ED Provider at bedside. 

## 2019-05-08 NOTE — ED Triage Notes (Signed)
Pt states she had blood in pee, high BGL, and was shaking and having left-sided CP, and left sided numbness. Pt reports taking aspirin and Nitro.  Pt aox4. NAD noted. All four extremities move against gravity.

## 2019-05-08 NOTE — ED Triage Notes (Signed)
First Nurse Note:  Arrives via [facility].  Per report, patientc/o palpitations earlier today and took NTG and ASA.  Patient stated that blood sugar, and weight went down,  And BP up.  Also c/o leg swelling.  Arrives from home.  Per EMS:  VS wnl.  CBG:  128

## 2019-05-08 NOTE — ED Provider Notes (Signed)
[facility] Emergency Department Provider Note  ____________________________________________   First MD Initiated Contact with Patient 05/08/19 2125     (approximate)  I have reviewed the triage vital signs and the nursing notes.   HISTORY  Chief Complaint Hematuria    HPI Christine Zimmerman is a 73 y.o. female  Here with multiple complaints. Pt reports that over the past 1-2 days, she's had progressively worsening headache, generalized weakness, leg swelling, and chest pain with hypertension. She also incidentally noticed some blood in her urine today though that was transient.  Pt reports that earlier today, she began to have a mild, dull, aching headache. She felt unsteady on her feat and somewhat weak with tremors. She went to the bathroom and noticed she had some blood in her urine, though she had no dysuria. She then drank "a lot" of water to try to clear this and states that while the urine cleared, she felt weaker and more fatigued. She then developed a sharp but also pressure like chest pain radiating to her left shoulder, along with nausea. She felt weak and intermittently numb in her feet.   She had difficulty getting around her house 2/2 the weakness, tremors so called EMS for evaluation.        Past Medical History:  Diagnosis Date  . Atrial fibrillation (HCC)   . CHF (congestive heart failure) (HCC)   . Chronic pain   . Coronary artery disease   . Diabetes mellitus without complication (HCC)   . GERD (gastroesophageal reflux disease)   . High cholesterol   . Hypertension   . Hypothyroidism     Patient Active Problem List   Diagnosis Date Noted  . Weakness generalized 05/09/2019  . Chronic diastolic heart failure (HCC) 03/23/2018  . HTN (hypertension) 03/23/2018  . DM (diabetes mellitus) (HCC) 03/23/2018  . SOB (shortness of breath) 02/01/2018  . Dizziness 08/01/2017  . TIA (transient ischemic attack) 10/17/2015  . Hyponatremia  10/17/2015  . Anemia 10/17/2015  . Chest pain 10/16/2015  . Left-sided weakness 10/16/2015  . Hypotension 10/16/2015  . Bradycardia 10/16/2015  . Hypoglycemia 10/16/2015    Past Surgical History:  Procedure Laterality Date  . BACK SURGERY    . CARDIAC CATHETERIZATION Left 10/23/2015   Procedure: Left Heart Cath and Coronary Angiography;  Surgeon: Alwyn Pea, MD;  Location: [facility] INVASIVE CV LAB;  Service: Cardiovascular;  Laterality: Left;  . COLONOSCOPY WITH PROPOFOL N/A 01/03/2017   Procedure: COLONOSCOPY WITH PROPOFOL;  Surgeon: Scot Jun, MD;  Location: [facility] ENDOSCOPY;  Service: Endoscopy;  Laterality: N/A;  . CORONARY STENT PLACEMENT     x 4 at [facility]. x 1 by [facility] in 2004  . LEFT HEART CATH AND CORONARY ANGIOGRAPHY N/A 06/08/2017   Procedure: LEFT HEART CATH AND CORONARY ANGIOGRAPHY;  Surgeon: Alwyn Pea, MD;  Location: [facility] INVASIVE CV LAB;  Service: Cardiovascular;  Laterality: N/A;    Prior to Admission medications   Medication Sig Start Date End Date Taking? Authorizing Provider  amLODipine (NORVASC) 5 MG tablet Take 2.5 mg by mouth daily.    Yes [provider]  aspirin EC 81 MG tablet Take 81 mg by mouth daily.   Yes [provider]  atorvastatin (LIPITOR) 80 MG tablet Take 80 mg by mouth daily.   Yes [provider]  Biotin w/ Vitamins C & E (HAIR/SKIN/NAILS PO) Take 1 tablet by mouth daily.   Yes [provider]  clopidogrel (PLAVIX) 75 MG  tablet Take 75 mg by mouth daily.   Yes [provider]  diclofenac sodium (VOLTAREN) 1 % GEL Apply 2 g topically 4 (four) times daily.   Yes [provider]  empagliflozin (JARDIANCE) 10 MG TABS tablet Take 5 mg by mouth daily.    Yes [provider]  ferrous sulfate 325 (65 FE) MG tablet Take 1 tablet by mouth 2 (two) times daily with a meal.  11/10/16  Yes [provider]  furosemide (LASIX) 20 MG tablet Take 20 mg by mouth daily as needed  for edema.   Yes [provider]  gabapentin (NEURONTIN) 400 MG capsule Take 800 mg by mouth.    Yes [provider]  glipiZIDE (GLUCOTROL XL) 5 MG 24 hr tablet Take 1 tablet (5 mg total) by mouth 2 (two) times daily. 08/03/17  Yes Altamese DillingVachhani, Vaibhavkumar, MD  isosorbide mononitrate (IMDUR) 30 MG 24 hr tablet Take 30 mg by mouth daily.    Yes [provider]  levothyroxine (SYNTHROID, LEVOTHROID) 50 MCG tablet Take 50 mcg by mouth daily before breakfast.    Yes [provider]  lisinopril (PRINIVIL,ZESTRIL) 20 MG tablet Take 20 mg by mouth daily.   Yes [provider]  magnesium oxide (MAG-OX) 400 MG tablet Take 400 mg by mouth at bedtime.   Yes [provider]  meclizine (ANTIVERT) 25 MG tablet Take 25 mg by mouth 3 (three) times daily as needed for dizziness.   Yes [provider]  metFORMIN (GLUCOPHAGE) 1000 MG tablet Take 1,000 mg by mouth daily with breakfast.    Yes [provider]  niacin 500 MG tablet Take 500 mg by mouth at bedtime.    Yes [provider]  olmesartan (BENICAR) 40 MG tablet Take 40 mg by mouth daily.   Yes [provider]  pantoprazole (PROTONIX) 40 MG tablet Take 40 mg by mouth every evening.    Yes [provider]  potassium chloride (K-DUR,KLOR-CON) 10 MEQ tablet Take 10 mEq by mouth daily as needed (with furosemide).   Yes [provider]  tiZANidine (ZANAFLEX) 2 MG tablet Take 4 mg by mouth at bedtime.    Yes [provider]  chlorthalidone (HYGROTON) 25 MG tablet Take 12.5 mg by mouth every evening.     [provider]  nitroGLYCERIN (NITROLINGUAL) 0.4 MG/SPRAY spray Place 1 spray under the tongue every 5 (five) minutes x 3 doses as needed for chest pain.    [provider]    Allergies Biaxin [clarithromycin], Homatropine, Januvia [sitagliptin], Macrodantin [nitrofurantoin macrocrystal], Naproxen, Propoxyphene, Tramadol, and  Penicillins  Family History  Problem Relation Age of Onset  . Breast cancer Neg Hx     Social History Social History   Tobacco Use  . Smoking status: Never Smoker  . Smokeless tobacco: Never Used  Substance Use Topics  . Alcohol use: No  . Drug use: No    Review of Systems  Review of Systems  Constitutional: Positive for fatigue. Negative for fever.  HENT: Negative for congestion and sore throat.   Eyes: Negative for visual disturbance.  Respiratory: Positive for chest tightness. Negative for cough and shortness of breath.   Cardiovascular: Positive for chest pain.  Gastrointestinal: Negative for abdominal pain, diarrhea, nausea and vomiting.  Genitourinary: Negative for flank pain.  Musculoskeletal: Positive for gait problem. Negative for back pain and neck pain.  Skin: Negative for rash and wound.  Neurological: Positive for tremors and weakness.  All other systems reviewed and  are negative.    ____________________________________________  PHYSICAL EXAM:      VITAL SIGNS: ED Triage Vitals  Enc Vitals Group     BP 05/08/19 1716 (!) 149/51     Pulse Rate 05/08/19 1716 76     Resp 05/08/19 1716 18     Temp 05/08/19 1716 99.4 F (37.4 C)     Temp Source 05/08/19 1716 Oral     SpO2 05/08/19 1716 98 %     Weight 05/08/19 1717 173 lb (78.5 kg)     Height 05/08/19 1717  (1.575 m)     Head Circumference --      Peak Flow --      Pain Score 05/08/19 1716 2     Pain Loc --      Pain Edu? --      Excl. in GC? --      Physical Exam Vitals and nursing note reviewed.  Constitutional:      General: She is not in acute distress.    Appearance: She is well-developed.  HENT:     Head: Normocephalic and atraumatic.  Eyes:     Conjunctiva/sclera: Conjunctivae normal.  Neck:     Comments: +JVD to the angle of the jaw, with +hepatojugular reflex Cardiovascular:     Rate and Rhythm: Normal rate and regular rhythm.     Heart sounds: Normal heart sounds. No murmur.  No friction rub.  Pulmonary:     Effort: Pulmonary effort is normal. No respiratory distress.     Breath sounds: Rales (bibasilar) present. No wheezing.  Abdominal:     General: There is no distension.     Palpations: Abdomen is soft.     Tenderness: There is no abdominal tenderness.  Musculoskeletal:     Cervical back: Neck supple.     Right lower leg: Edema present.     Left lower leg: Edema present.  Skin:    General: Skin is warm.     Capillary Refill: Capillary refill takes less than 2 seconds.  Neurological:     Mental Status: She is alert and oriented to person, place, and time.     Motor: No abnormal muscle tone.       ____________________________________________   LABS (all labs ordered are listed, but only abnormal results are displayed)  Labs Reviewed  URINALYSIS, COMPLETE (UACMP) WITH MICROSCOPIC - Abnormal; Notable for the following components:      Result Value   Color, Urine STRAW (*)    APPearance CLEAR (*)    Specific Gravity, Urine 1.002 (*)    Glucose, UA 150 (*)    All other components within normal limits  BASIC METABOLIC PANEL - Abnormal; Notable for the following components:   Sodium 124 (*)    Chloride 89 (*)    All other components within normal limits  OSMOLALITY - Abnormal; Notable for the following components:   Osmolality 273 (*)    All other components within normal limits  OSMOLALITY, URINE - Abnormal; Notable for the following components:   Osmolality, Ur 101 (*)    All other components within normal limits  BRAIN NATRIURETIC PEPTIDE - Abnormal; Notable for the following components:   B Natriuretic Peptide 161.0 (*)    All other components within normal limits  SARS CORONAVIRUS 2 (TAT 6-24 HRS)  CBC  GLUCOSE, CAPILLARY  GLUCOSE, CAPILLARY  GLUCOSE, CAPILLARY  HEPATIC FUNCTION PANEL  SODIUM, URINE, RANDOM  TROPONIN I (HIGH SENSITIVITY)  TROPONIN I (HIGH SENSITIVITY)  ____________________________________________  EKG: Normal  sinus rhythm, VR 64. TWI in inferior leads, no ST elevations. No signs of ischemia or infarct. ________________________________________  RADIOLOGY All imaging, including plain films, CT scans, and ultrasounds, independently reviewed by me, and interpretations confirmed via formal radiology reads.  ED MD interpretation:   CXR: Mild chronic bronchitic changes CT Head: Normal  CT Angio C/A/P: No dissection or acute abnormality  Official radiology report(s): DG Chest 2 View  Result Date: 05/08/2019 CLINICAL DATA:  Chest pain EXAM: CHEST - 2 VIEW COMPARISON:  02/01/2018 FINDINGS: Enlargement of cardiac silhouette. Atherosclerotic calcification and tortuosity of thoracic aorta. Mediastinal contours and pulmonary vascularity otherwise normal. Chronic bronchitic changes without infiltrate, pleural effusion or pneumothorax. Bones demineralized. IMPRESSION: Enlargement of cardiac silhouette. Mild chronic bronchitic changes without infiltrate. Electronically Signed   By: Ulyses Southward M.D.   On: 05/08/2019 18:25   CT Head Wo Contrast  Result Date: 05/08/2019 CLINICAL DATA:  Left-sided numbness EXAM: CT HEAD WITHOUT CONTRAST TECHNIQUE: Contiguous axial images were obtained from the base of the skull through the vertex without intravenous contrast. COMPARISON:  Head CT 07/11/2017 FINDINGS: Brain: There is no mass, hemorrhage or extra-axial collection. The size and configuration of the ventricles and extra-axial CSF spaces are normal. The brain parenchyma is normal, without acute or chronic infarction. Vascular: Atherosclerotic calcification of the internal carotid arteries at the skull base. No abnormal hyperdensity of the major intracranial arteries or dural venous sinuses. Skull: The visualized skull base, calvarium and extracranial soft tissues are normal. Sinuses/Orbits: No fluid levels or advanced mucosal thickening of the visualized paranasal sinuses. No mastoid or middle ear effusion. The orbits are  normal. IMPRESSION: Normal head CT for age. Electronically Signed   By: Deatra Robinson M.D.   On: 05/08/2019 22:36   CT Angio Chest/Abd/Pel for Dissection W and/or Wo Contrast  Result Date: 05/08/2019 CLINICAL DATA:  Hematuria, chest pain and left-sided numbness. EXAM: CT ANGIOGRAPHY CHEST, ABDOMEN AND PELVIS TECHNIQUE: Multidetector CT imaging through the chest, abdomen and pelvis was performed using the standard protocol during bolus administration of intravenous contrast. Multiplanar reconstructed images and MIPs were obtained and reviewed to evaluate the vascular anatomy. CONTRAST:  OMNIPAQUE IOHEXOL 350 MG/ML SOLN COMPARISON:  None. FINDINGS: CTA CHEST FINDINGS Cardiovascular: --Heart: The heart size is normal. There is nopericardial effusion. There are coronary artery calcifications. --Aorta: The course and caliber of the thoracic aorta are normal. There is mild aortic atherosclerotic calcification. Precontrast images show no aortic intramural hematoma. There is no blood pool, dissection or penetrating ulcer demonstrated on arterial phase postcontrast imaging. There is a conventional 3 vessel aortic arch branching pattern. The proximal arch vessels are widely patent. --Pulmonary Arteries: Contrast timing is optimized for preferential opacification of the aorta. Within that limitation, normal central pulmonary arteries. Mediastinum/Nodes: No mediastinal, hilar or axillary lymphadenopathy. The visualized thyroid and thoracic esophageal course are unremarkable. Lungs/Pleura: No pulmonary nodules or masses. No pleural effusion or pneumothorax. No focal airspace consolidation. No focal pleural abnormality. Musculoskeletal: No chest wall abnormality. No acute osseous findings. Review of the MIP images confirms the above findings. CTA ABDOMEN AND PELVIS FINDINGS VASCULAR Aorta: Normal caliber aorta without aneurysm, dissection, vasculitis or hemodynamically significant stenosis. There is mild aortic  atherosclerosis. Celiac: No aneurysm, dissection or hemodynamically significant stenosis. Normal branching pattern. SMA: Widely patent without dissection or stenosis. Renals: Single renal arteries bilaterally. No aneurysm, dissection, stenosis or evidence of fibromuscular dysplasia. IMA: Patent without abnormality. Inflow: No aneurysm, stenosis or dissection. Veins: Normal course and  caliber of the major veins. Assessment is otherwise limited by the arterial dominant contrast phase. Review of the MIP images confirms the above findings. NON-VASCULAR Hepatobiliary: Normal hepatic contours and density. No visible biliary dilatation. Normal gallbladder. Pancreas: Normal contours without ductal dilatation. No peripancreatic fluid collection. Spleen: Normal arterial phase splenic enhancement pattern. Adrenals/Urinary Tract: --Adrenal glands: Normal. --Right kidney/ureter: No hydronephrosis or perinephric stranding. No nephrolithiasis. No obstructing ureteral stones. --Left kidney/ureter: No hydronephrosis or perinephric stranding. No nephrolithiasis. No obstructing ureteral stones. --Urinary bladder: Unremarkable. Stomach/Bowel: --Stomach/Duodenum: No hiatal hernia or other gastric abnormality. Normal duodenal course and caliber. --Small bowel: No dilatation or inflammation. --Colon: No focal abnormality. --Appendix: Normal. Lymphatic: No abdominal or pelvic lymphadenopathy. Reproductive: Normal uterus and ovaries. Musculoskeletal. No bony spinal canal stenosis or focal osseous abnormality. Other: None. Review of the MIP images confirms the above findings. IMPRESSION: 1. No acute aortic syndrome or other acute abnormality of the chest, abdomen or pelvis. 2. Coronary artery and aortic atherosclerosis (ICD10-I70.0). Electronically Signed   By: Ulyses Jarred M.D.   On: 05/08/2019 22:43    ____________________________________________  PROCEDURES   Procedure(s) performed (including Critical  Care):  Procedures  ____________________________________________  INITIAL IMPRESSION / MDM / [place] / ED COURSE  As part of my medical decision making, I reviewed the following data within the [place] notes reviewed and incorporated, Old chart reviewed, Notes from prior ED visits, and [place] Controlled Substance Database       *Christine Zimmerman was evaluated in Emergency Department on 05/09/2019 for the symptoms described in the history of present illness. She was evaluated in the context of the global COVID-19 pandemic, which necessitated consideration that the patient might be at risk for infection with the SARS-CoV-2 virus that causes COVID-19. Institutional protocols and algorithms that pertain to the evaluation of patients at risk for COVID-19 are in a state of rapid change based on information released by regulatory bodies including the CDC and federal and state organizations. These policies and algorithms were followed during the patient's care in the ED.  Some ED evaluations and interventions may be delayed as a result of limited staffing during the pandemic.*     Medical Decision Making:  73 yo F here with generalized weakness, chest pain, leg swelling, transient hematuria. While the etiology of hematuria is unclear, I suspect her weakness, CP, hypertension could be a component of acute on chronic dCHF as well as acute on chronic hypervolemic hyponatremia. Will plan to admit. IV lasix given. Serum, urine osm added on. COVID pending.  ____________________________________________  FINAL CLINICAL IMPRESSION(S) / ED DIAGNOSES  Final diagnoses:  Hyponatremia  Weakness     MEDICATIONS GIVEN DURING THIS VISIT:  Medications  sodium chloride flush (NS) 0.9 % injection 3 mL (3 mLs Intravenous Not Given 05/08/19 2157)  ondansetron (ZOFRAN) injection 4 mg (4 mg Intravenous Given 05/08/19 2245)  0.9 %  sodium chloride infusion ( Intravenous Stopped  05/08/19 2308)  iohexol (OMNIPAQUE) 350 MG/ML injection 100 mL (100 mLs Intravenous Contrast Given 05/08/19 2219)  furosemide (LASIX) injection 40 mg (40 mg Intravenous Given 05/08/19 2312)     ED Discharge Orders    None       Note:  This document was prepared using Dragon voice recognition software and may include unintentional dictation errors.   Duffy Bruce, MD 05/09/19 [phone]

## 2019-05-08 NOTE — ED Notes (Signed)
Patient transported to CT 

## 2019-05-08 NOTE — ED Notes (Addendum)
Pt possible stroke workup, NP notified and at bedside now.

## 2019-05-09 DIAGNOSIS — Z880 Allergy status to penicillin: Secondary | ICD-10-CM | POA: Diagnosis not present

## 2019-05-09 DIAGNOSIS — Z7982 Long term (current) use of aspirin: Secondary | ICD-10-CM | POA: Diagnosis not present

## 2019-05-09 DIAGNOSIS — Z8673 Personal history of transient ischemic attack (TIA), and cerebral infarction without residual deficits: Secondary | ICD-10-CM | POA: Diagnosis not present

## 2019-05-09 DIAGNOSIS — I480 Paroxysmal atrial fibrillation: Secondary | ICD-10-CM | POA: Diagnosis present

## 2019-05-09 DIAGNOSIS — K219 Gastro-esophageal reflux disease without esophagitis: Secondary | ICD-10-CM | POA: Diagnosis present

## 2019-05-09 DIAGNOSIS — Z79899 Other long term (current) drug therapy: Secondary | ICD-10-CM | POA: Diagnosis not present

## 2019-05-09 DIAGNOSIS — I110 Hypertensive heart disease with heart failure: Secondary | ICD-10-CM | POA: Diagnosis present

## 2019-05-09 DIAGNOSIS — E860 Dehydration: Secondary | ICD-10-CM | POA: Diagnosis present

## 2019-05-09 DIAGNOSIS — I5032 Chronic diastolic (congestive) heart failure: Secondary | ICD-10-CM | POA: Diagnosis present

## 2019-05-09 DIAGNOSIS — Z79890 Hormone replacement therapy (postmenopausal): Secondary | ICD-10-CM | POA: Diagnosis not present

## 2019-05-09 DIAGNOSIS — E871 Hypo-osmolality and hyponatremia: Secondary | ICD-10-CM | POA: Diagnosis present

## 2019-05-09 DIAGNOSIS — E119 Type 2 diabetes mellitus without complications: Secondary | ICD-10-CM | POA: Diagnosis present

## 2019-05-09 DIAGNOSIS — G8929 Other chronic pain: Secondary | ICD-10-CM | POA: Diagnosis present

## 2019-05-09 DIAGNOSIS — I2510 Atherosclerotic heart disease of native coronary artery without angina pectoris: Secondary | ICD-10-CM | POA: Diagnosis present

## 2019-05-09 DIAGNOSIS — E039 Hypothyroidism, unspecified: Secondary | ICD-10-CM | POA: Diagnosis present

## 2019-05-09 DIAGNOSIS — I959 Hypotension, unspecified: Secondary | ICD-10-CM | POA: Diagnosis present

## 2019-05-09 DIAGNOSIS — M7989 Other specified soft tissue disorders: Secondary | ICD-10-CM | POA: Diagnosis present

## 2019-05-09 DIAGNOSIS — R531 Weakness: Secondary | ICD-10-CM

## 2019-05-09 DIAGNOSIS — R319 Hematuria, unspecified: Secondary | ICD-10-CM | POA: Diagnosis present

## 2019-05-09 DIAGNOSIS — Z20828 Contact with and (suspected) exposure to other viral communicable diseases: Secondary | ICD-10-CM | POA: Diagnosis present

## 2019-05-09 DIAGNOSIS — Z7902 Long term (current) use of antithrombotics/antiplatelets: Secondary | ICD-10-CM | POA: Diagnosis not present

## 2019-05-09 DIAGNOSIS — Z888 Allergy status to other drugs, medicaments and biological substances status: Secondary | ICD-10-CM | POA: Diagnosis not present

## 2019-05-09 DIAGNOSIS — E7800 Pure hypercholesterolemia, unspecified: Secondary | ICD-10-CM | POA: Diagnosis present

## 2019-05-09 DIAGNOSIS — Z7984 Long term (current) use of oral hypoglycemic drugs: Secondary | ICD-10-CM | POA: Diagnosis not present

## 2019-05-09 DIAGNOSIS — Z955 Presence of coronary angioplasty implant and graft: Secondary | ICD-10-CM | POA: Diagnosis not present

## 2019-05-09 LAB — OSMOLALITY: Osmolality: 273 mOsm/kg — ABNORMAL LOW (ref 275–295)

## 2019-05-09 LAB — OSMOLALITY, URINE: Osmolality, Ur: 101 mOsm/kg — ABNORMAL LOW (ref 300–900)

## 2019-05-09 LAB — BRAIN NATRIURETIC PEPTIDE: B Natriuretic Peptide: 161 pg/mL — ABNORMAL HIGH (ref 0.0–100.0)

## 2019-05-09 LAB — SODIUM, URINE, RANDOM: Sodium, Ur: 22 mmol/L

## 2019-05-09 LAB — SARS CORONAVIRUS 2 (TAT 6-24 HRS): SARS Coronavirus 2: NEGATIVE

## 2019-05-09 MED ORDER — EMPAGLIFLOZIN 10 MG PO TABS: 5.0000 mg | ORAL_TABLET | Freq: Every day | ORAL | Status: DC

## 2019-05-09 MED ORDER — NIACIN 500 MG PO TABS: 500.0000 mg | ORAL_TABLET | Freq: Every day | ORAL | Status: DC

## 2019-05-09 MED ORDER — ONDANSETRON HCL 4 MG PO TABS: 4.0000 mg | ORAL_TABLET | Freq: Four times a day (QID) | ORAL | Status: DC | PRN

## 2019-05-09 MED ORDER — TIZANIDINE HCL 4 MG PO TABS: 4.0000 mg | ORAL_TABLET | Freq: Every day | ORAL | Status: DC

## 2019-05-09 MED ORDER — MECLIZINE HCL 25 MG PO TABS: 25.0000 mg | ORAL_TABLET | Freq: Three times a day (TID) | ORAL | Status: DC | PRN

## 2019-05-09 MED ORDER — HEPARIN SODIUM (PORCINE) 5000 UNIT/ML IJ SOLN: 5000.0000 [IU] | Freq: Three times a day (TID) | INTRAMUSCULAR | Status: DC

## 2019-05-09 MED ORDER — PANTOPRAZOLE SODIUM 40 MG PO TBEC: 40.0000 mg | DELAYED_RELEASE_TABLET | Freq: Every evening | ORAL | Status: DC

## 2019-05-09 MED ORDER — IRBESARTAN 150 MG PO TABS: 300.0000 mg | ORAL_TABLET | Freq: Every day | ORAL | Status: DC

## 2019-05-09 MED ORDER — NITROGLYCERIN 0.4 MG/SPRAY TL SOLN: 1.0000 | Status: DC | PRN

## 2019-05-09 MED ORDER — FERROUS SULFATE 325 (65 FE) MG PO TABS: 325.0000 mg | ORAL_TABLET | Freq: Two times a day (BID) | ORAL | Status: DC

## 2019-05-09 MED ORDER — ISOSORBIDE MONONITRATE ER 30 MG PO TB24: 30.0000 mg | ORAL_TABLET | Freq: Every day | ORAL | Status: DC

## 2019-05-09 MED ORDER — INSULIN ASPART 100 UNIT/ML ~~LOC~~ SOLN: 0.0000 [IU] | Freq: Every day | SUBCUTANEOUS | Status: DC

## 2019-05-09 MED ORDER — LISINOPRIL 10 MG PO TABS: 20.0000 mg | ORAL_TABLET | Freq: Every day | ORAL | Status: DC

## 2019-05-09 MED ORDER — MAGNESIUM OXIDE 400 MG PO TABS: 400.0000 mg | ORAL_TABLET | Freq: Every day | ORAL | Status: DC

## 2019-05-09 MED ORDER — FUROSEMIDE 20 MG PO TABS: 20.0000 mg | ORAL_TABLET | Freq: Every day | ORAL | Status: DC | PRN

## 2019-05-09 MED ORDER — ONDANSETRON HCL 4 MG/2ML IJ SOLN: 4.0000 mg | Freq: Four times a day (QID) | INTRAMUSCULAR | Status: DC | PRN

## 2019-05-09 MED ORDER — POTASSIUM CHLORIDE CRYS ER 20 MEQ PO TBCR: 10.0000 meq | EXTENDED_RELEASE_TABLET | Freq: Every day | ORAL | Status: DC | PRN

## 2019-05-09 MED ORDER — ASPIRIN EC 81 MG PO TBEC: 81.0000 mg | DELAYED_RELEASE_TABLET | Freq: Every day | ORAL | Status: DC

## 2019-05-09 MED ORDER — AMLODIPINE BESYLATE 5 MG PO TABS: 2.5000 mg | ORAL_TABLET | Freq: Every day | ORAL | Status: DC

## 2019-05-09 MED ORDER — ATORVASTATIN CALCIUM 80 MG PO TABS: 80.0000 mg | ORAL_TABLET | Freq: Every day | ORAL | Status: DC

## 2019-05-09 MED ORDER — GLIPIZIDE ER 5 MG PO TB24: 5.0000 mg | ORAL_TABLET | Freq: Two times a day (BID) | ORAL | Status: DC

## 2019-05-09 MED ORDER — CLOPIDOGREL BISULFATE 75 MG PO TABS: 75.0000 mg | ORAL_TABLET | Freq: Every day | ORAL | Status: DC

## 2019-05-09 MED ORDER — INSULIN ASPART 100 UNIT/ML ~~LOC~~ SOLN: 0.0000 [IU] | Freq: Three times a day (TID) | SUBCUTANEOUS | Status: DC

## 2019-05-09 MED ORDER — LEVOTHYROXINE SODIUM 50 MCG PO TABS: 50.0000 ug | ORAL_TABLET | Freq: Every day | ORAL | Status: DC

## 2019-05-09 MED ORDER — GABAPENTIN 400 MG PO CAPS: 800.0000 mg | ORAL_CAPSULE | Freq: Three times a day (TID) | ORAL | Status: DC

## 2019-05-09 NOTE — ED Notes (Signed)
Christine Zimmerman in to speak with patient about leaving AMA, form signed.

## 2019-05-09 NOTE — H&P (Signed)
History and Physical   Christine Zimmerman:[id number] DOB: [birth year] DOA: 05/08/2019  Referring MD/NP/PA: Dr. Ellender Hose  PCP: Tracie Harrier, MD   Patient coming from: Home  Chief Complaint: Generalized weakness  HPI: Christine Zimmerman is a 73 y.o. female with medical history significant of coronary artery disease, diastolic dysfunction CHF, A. fib, diabetes, chronic pain syndrome, GERD, hyperlipidemia, hypothyroidism and hypertension who presented to the ER with significant weakness all over.  Patient also reported progressive weakness leg swelling and chest pain over the last 1 to 3 days.  She is also have some headaches.  She noted her blood pressure was going up higher than usual.  She felt weak but did not fall.  Denied any fever or chills.  Denied any sick contacts including exposure to COVID-19.  Patient has had global headaches.  She also reported seeing some blood in her urine while in the bathroom.  She decided to come to the ER where she was evaluated.  Patient was seen to have potassium 124 and evidence of dehydration.  She is being admitted with generalized weakness and hyponatremia..  ED Course: Temperature 99.4 blood pressure 177/70 pulse 56 respiratory rate of 18 oxygen sat 98% on room air.  White count 8 hemoglobin 13.0 platelets 396.  Sodium 124, potassium 3.7 chloride 89 CO2 23 BUN 10 and creatinine 0.69 calcium 9.0.  LFTs all within normal.  COVID-19 screen so far negative.  Head CT without contrast negative CT angiogram chest and abdomen also negative.  Patient is being admitted for further work-up.  Review of Systems: As per HPI otherwise 10 point review of systems negative.    Past Medical History:  Diagnosis Date  . Atrial fibrillation ([place])   . CHF (congestive heart failure) ([place])   . Chronic pain   . Coronary artery disease   . Diabetes mellitus without complication ([place])   . GERD (gastroesophageal reflux disease)   . High cholesterol   . Hypertension   .  Hypothyroidism     Past Surgical History:  Procedure Laterality Date  . BACK SURGERY    . CARDIAC CATHETERIZATION Left 10/23/2015   Procedure: Left Heart Cath and Coronary Angiography;  Surgeon: Yolonda Kida, MD;  Location: [place] [facility];  Service: Cardiovascular;  Laterality: Left;  . COLONOSCOPY WITH PROPOFOL N/A 01/03/2017   Procedure: COLONOSCOPY WITH PROPOFOL;  Surgeon: Manya Silvas, MD;  Location: [facility] ENDOSCOPY;  Service: Endoscopy;  Laterality: N/A;  . CORONARY STENT PLACEMENT     x 4 at [facility]. x 1 by [facility] in 2004  . LEFT HEART CATH AND CORONARY ANGIOGRAPHY N/A 06/08/2017   Procedure: LEFT HEART CATH AND CORONARY ANGIOGRAPHY;  Surgeon: Yolonda Kida, MD;  Location: [place] [facility];  Service: Cardiovascular;  Laterality: N/A;     reports that she has never smoked. She has never used smokeless tobacco. She reports that she does not drink alcohol or use drugs.  Allergies  Allergen Reactions  . Biaxin [Clarithromycin] Hives  . Homatropine Other (See Comments)    Hycodan  . Januvia [Sitagliptin] Other (See Comments)    Unknown  . Macrodantin [Nitrofurantoin Macrocrystal] Other (See Comments)    Unknown  . Naproxen Other (See Comments)    Unknown  . Propoxyphene Other (See Comments)    Darvocet  . Tramadol Other (See Comments)    Unknown  . Penicillins Rash and Other (See Comments)    Has patient had a PCN reaction causing immediate rash, facial/tongue/throat swelling,  SOB or lightheadedness with hypotension: No Has patient had a PCN reaction causing severe rash involving mucus membranes or skin necrosis: No Has patient had a PCN reaction that required hospitalization: Yes Has patient had a PCN reaction occurring within the last 10 years: Unknown If all of the above answers are "NO", then may proceed with Cephalosporin use.     Family History  Problem Relation Age of Onset  . Breast cancer Neg Hx      Prior to Admission medications     Medication Sig Start Date End Date Taking? Authorizing Provider  amLODipine (NORVASC) 5 MG tablet Take 2.5 mg by mouth daily.    Yes [provider]  aspirin EC 81 MG tablet Take 81 mg by mouth daily.   Yes [provider]  atorvastatin (LIPITOR) 80 MG tablet Take 80 mg by mouth daily.   Yes [provider]  Biotin w/ Vitamins C & E (HAIR/SKIN/NAILS PO) Take 1 tablet by mouth daily.   Yes [provider]  clopidogrel (PLAVIX) 75 MG tablet Take 75 mg by mouth daily.   Yes [provider]  diclofenac sodium (VOLTAREN) 1 % GEL Apply 2 g topically 4 (four) times daily.   Yes [provider]  empagliflozin (JARDIANCE) 10 MG TABS tablet Take 5 mg by mouth daily.    Yes [provider]  ferrous sulfate 325 (65 FE) MG tablet Take 1 tablet by mouth 2 (two) times daily with a meal.  11/10/16  Yes [provider]  furosemide (LASIX) 20 MG tablet Take 20 mg by mouth daily as needed for edema.   Yes [provider]  gabapentin (NEURONTIN) 400 MG capsule Take 800 mg by mouth.    Yes [provider]  glipiZIDE (GLUCOTROL XL) 5 MG 24 hr tablet Take 1 tablet (5 mg total) by mouth 2 (two) times daily. 08/03/17  Yes Altamese DillingVachhani, Vaibhavkumar, MD  isosorbide mononitrate (IMDUR) 30 MG 24 hr tablet Take 30 mg by mouth daily.    Yes [provider]  levothyroxine (SYNTHROID, LEVOTHROID) 50 MCG tablet Take 50 mcg by mouth daily before breakfast.    Yes [provider]  lisinopril (PRINIVIL,ZESTRIL) 20 MG tablet Take 20 mg by mouth daily.   Yes [provider]  magnesium oxide (MAG-OX) 400 MG tablet Take 400 mg by mouth at bedtime.   Yes [provider]  meclizine (ANTIVERT) 25 MG tablet Take 25 mg by mouth 3 (three) times daily as needed for dizziness.   Yes [provider]  metFORMIN (GLUCOPHAGE) 1000 MG tablet Take 1,000 mg by mouth daily with breakfast.    Yes [provider]   niacin 500 MG tablet Take 500 mg by mouth at bedtime.    Yes [provider]  olmesartan (BENICAR) 40 MG tablet Take 40 mg by mouth daily.   Yes [provider]  pantoprazole (PROTONIX) 40 MG tablet Take 40 mg by mouth every evening.    Yes [provider]  potassium chloride (K-DUR,KLOR-CON) 10 MEQ tablet Take 10 mEq by mouth daily as needed (with furosemide).   Yes [provider]  tiZANidine (ZANAFLEX) 2 MG tablet Take 4 mg by mouth at bedtime.    Yes [provider]  chlorthalidone (HYGROTON) 25 MG tablet Take 12.5 mg by mouth every evening.     [provider]  nitroGLYCERIN (NITROLINGUAL) 0.4 MG/SPRAY spray Place 1 spray under the tongue every 5 (five) minutes x 3 doses as needed for chest  pain.    [provider]    Physical Exam: Vitals:   05/08/19 2200 05/08/19 2248 05/08/19 2300 05/08/19 2330  BP: (!) 145/49 (!) 159/62 (!) 153/60 (!) 177/70  Pulse: 76 74 72 76  Resp: 15 16 13 18   Temp:      TempSrc:      SpO2: 100% 98% 99% 99%  Weight:      Height:          Constitutional: NAD, chronically ill lately Vitals:   05/08/19 2200 05/08/19 2248 05/08/19 2300 05/08/19 2330  BP: (!) 145/49 (!) 159/62 (!) 153/60 (!) 177/70  Pulse: 76 74 72 76  Resp: 15 16 13 18   Temp:      TempSrc:      SpO2: 100% 98% 99% 99%  Weight:      Height:       Eyes: PERRL, lids and conjunctivae normal ENMT: Mucous membranes are moist. Posterior pharynx clear of any exudate or lesions.Normal dentition.  Neck: normal, supple, no masses, no thyromegaly Respiratory: clear to auscultation bilaterally, no wheezing, mild bilateral crackles. Normal respiratory effort. No accessory muscle use.  Cardiovascular: Regular rate and rhythm, no murmurs / rubs / gallops.  2+ extremity edema. 2+ pedal pulses. No carotid bruits.  Abdomen: no tenderness, no masses palpated. No hepatosplenomegaly. Bowel sounds positive.  Musculoskeletal: no clubbing /  cyanosis. No joint deformity upper and lower extremities. Good ROM, no contractures. Normal muscle tone.  Skin: no rashes, lesions, ulcers. No induration Neurologic: CN 2-12 grossly intact. Sensation intact, DTR normal. Strength 5/5 in all 4.  Psychiatric: Normal judgment and insight. Alert and oriented x 3. Normal mood.     Labs on Admission: I have personally reviewed following labs and imaging studies  CBC: Recent Labs  Lab 05/08/19 1721  WBC 8.0  HGB 13.0  HCT 36.9  MCV 87.4  PLT 396   Basic Metabolic Panel: Recent Labs  Lab 05/08/19 1721  NA 124*  K 3.7  CL 89*  CO2 23  GLUCOSE 76  BUN 10  CREATININE 0.69  CALCIUM 9.0   GFR: Estimated Creatinine Clearance: 62.6 mL/min (by C-G formula based on SCr of 0.69 mg/dL). Liver Function Tests: Recent Labs  Lab 05/08/19 2202  AST 18  ALT 20  ALKPHOS 69  BILITOT 0.9  PROT 6.7  ALBUMIN 3.9   No results for input(s): LIPASE, AMYLASE in the last 168 hours. No results for input(s): AMMONIA in the last 168 hours. Coagulation Profile: No results for input(s): INR, PROTIME in the last 168 hours. Cardiac Enzymes: No results for input(s): CKTOTAL, CKMB, CKMBINDEX, TROPONINI in the last 168 hours. BNP (last 3 results) No results for input(s): PROBNP in the last 8760 hours. HbA1C: No results for input(s): HGBA1C in the last 72 hours. CBG: Recent Labs  Lab 05/08/19 1726 05/08/19 1835 05/08/19 2029  GLUCAP 70 96 80   Lipid Profile: No results for input(s): CHOL, HDL, LDLCALC, TRIG, CHOLHDL, LDLDIRECT in the last 72 hours. Thyroid Function Tests: No results for input(s): TSH, T4TOTAL, FREET4, T3FREE, THYROIDAB in the last 72 hours. Anemia Panel: No results for input(s): VITAMINB12, FOLATE, FERRITIN, TIBC, IRON, RETICCTPCT in the last 72 hours. Urine analysis:    Component Value Date/Time   COLORURINE STRAW (A) 05/08/2019 1720   APPEARANCEUR CLEAR (A) 05/08/2019 1720   APPEARANCEUR Clear 08/16/2014 0407   LABSPEC  1.002 (L) 05/08/2019 1720   LABSPEC 1.004 08/16/2014 0407   PHURINE 6.0 05/08/2019 1720   GLUCOSEU 150 (  A) 05/08/2019 1720   GLUCOSEU Negative 08/16/2014 0407   HGBUR NEGATIVE 05/08/2019 1720   BILIRUBINUR NEGATIVE 05/08/2019 1720   BILIRUBINUR Negative 08/16/2014 0407   KETONESUR NEGATIVE 05/08/2019 1720   PROTEINUR NEGATIVE 05/08/2019 1720   NITRITE NEGATIVE 05/08/2019 1720   LEUKOCYTESUR NEGATIVE 05/08/2019 1720   LEUKOCYTESUR Negative 08/16/2014 0407   Sepsis Labs: (procalcitonin:4,lacticidven:4) )No results found for this or any previous visit (from the past 240 hour(s)).   Radiological Exams on Admission: DG Chest 2 View  Result Date: 05/08/2019 CLINICAL DATA:  Chest pain EXAM: CHEST - 2 VIEW COMPARISON:  02/01/2018 FINDINGS: Enlargement of cardiac silhouette. Atherosclerotic calcification and tortuosity of thoracic aorta. Mediastinal contours and pulmonary vascularity otherwise normal. Chronic bronchitic changes without infiltrate, pleural effusion or pneumothorax. Bones demineralized. IMPRESSION: Enlargement of cardiac silhouette. Mild chronic bronchitic changes without infiltrate. Electronically Signed   By: Ulyses Southward M.D.   On: 05/08/2019 18:25   CT Head Wo Contrast  Result Date: 05/08/2019 CLINICAL DATA:  Left-sided numbness EXAM: CT HEAD WITHOUT CONTRAST TECHNIQUE: Contiguous axial images were obtained from the base of the skull through the vertex without intravenous contrast. COMPARISON:  Head CT 07/11/2017 FINDINGS: Brain: There is no mass, hemorrhage or extra-axial collection. The size and configuration of the ventricles and extra-axial CSF spaces are normal. The brain parenchyma is normal, without acute or chronic infarction. Vascular: Atherosclerotic calcification of the internal carotid arteries at the skull base. No abnormal hyperdensity of the major intracranial arteries or dural venous sinuses. Skull: The visualized skull base, calvarium and extracranial  soft tissues are normal. Sinuses/Orbits: No fluid levels or advanced mucosal thickening of the visualized paranasal sinuses. No mastoid or middle ear effusion. The orbits are normal. IMPRESSION: Normal head CT for age. Electronically Signed   By: Deatra Robinson M.D.   On: 05/08/2019 22:36   CT Angio Chest/Abd/Pel for Dissection W and/or Wo Contrast  Result Date: 05/08/2019 CLINICAL DATA:  Hematuria, chest pain and left-sided numbness. EXAM: CT ANGIOGRAPHY CHEST, ABDOMEN AND PELVIS TECHNIQUE: Multidetector CT imaging through the chest, abdomen and pelvis was performed using the standard protocol during bolus administration of intravenous contrast. Multiplanar reconstructed images and MIPs were obtained and reviewed to evaluate the vascular anatomy. CONTRAST:  OMNIPAQUE IOHEXOL 350 MG/ML SOLN COMPARISON:  None. FINDINGS: CTA CHEST FINDINGS Cardiovascular: --Heart: The heart size is normal. There is nopericardial effusion. There are coronary artery calcifications. --Aorta: The course and caliber of the thoracic aorta are normal. There is mild aortic atherosclerotic calcification. Precontrast images show no aortic intramural hematoma. There is no blood pool, dissection or penetrating ulcer demonstrated on arterial phase postcontrast imaging. There is a conventional 3 vessel aortic arch branching pattern. The proximal arch vessels are widely patent. --Pulmonary Arteries: Contrast timing is optimized for preferential opacification of the aorta. Within that limitation, normal central pulmonary arteries. Mediastinum/Nodes: No mediastinal, hilar or axillary lymphadenopathy. The visualized thyroid and thoracic esophageal course are unremarkable. Lungs/Pleura: No pulmonary nodules or masses. No pleural effusion or pneumothorax. No focal airspace consolidation. No focal pleural abnormality. Musculoskeletal: No chest wall abnormality. No acute osseous findings. Review of the MIP images confirms the above findings. CTA  ABDOMEN AND PELVIS FINDINGS VASCULAR Aorta: Normal caliber aorta without aneurysm, dissection, vasculitis or hemodynamically significant stenosis. There is mild aortic atherosclerosis. Celiac: No aneurysm, dissection or hemodynamically significant stenosis. Normal branching pattern. SMA: Widely patent without dissection or stenosis. Renals: Single renal arteries bilaterally. No aneurysm, dissection, stenosis or evidence of fibromuscular dysplasia. IMA: Patent without abnormality.  Inflow: No aneurysm, stenosis or dissection. Veins: Normal course and caliber of the major veins. Assessment is otherwise limited by the arterial dominant contrast phase. Review of the MIP images confirms the above findings. NON-VASCULAR Hepatobiliary: Normal hepatic contours and density. No visible biliary dilatation. Normal gallbladder. Pancreas: Normal contours without ductal dilatation. No peripancreatic fluid collection. Spleen: Normal arterial phase splenic enhancement pattern. Adrenals/Urinary Tract: --Adrenal glands: Normal. --Right kidney/ureter: No hydronephrosis or perinephric stranding. No nephrolithiasis. No obstructing ureteral stones. --Left kidney/ureter: No hydronephrosis or perinephric stranding. No nephrolithiasis. No obstructing ureteral stones. --Urinary bladder: Unremarkable. Stomach/Bowel: --Stomach/Duodenum: No hiatal hernia or other gastric abnormality. Normal duodenal course and caliber. --Small bowel: No dilatation or inflammation. --Colon: No focal abnormality. --Appendix: Normal. Lymphatic: No abdominal or pelvic lymphadenopathy. Reproductive: Normal uterus and ovaries. Musculoskeletal. No bony spinal canal stenosis or focal osseous abnormality. Other: None. Review of the MIP images confirms the above findings. IMPRESSION: 1. No acute aortic syndrome or other acute abnormality of the chest, abdomen or pelvis. 2. Coronary artery and aortic atherosclerosis (ICD10-I70.0). Electronically Signed   By: Deatra Robinson  M.D.   On: 05/08/2019 22:43    EKG: Independently reviewed.  It shows sinus rhythm with a rate of 64, multiple intraventricular conduction delays and some right bundle branch block  Assessment/Plan Principal Problem:   Weakness generalized Active Problems:   Hypotension   Hyponatremia   Chronic diastolic heart failure (HCC)   HTN (hypertension)   DM (diabetes mellitus) (HCC)     #1 generalized weakness: Patient will be admitted and work-up will be performed.  She has significant hyponatremia which could be a cause.  Also multiple medical problems including her CHF dehydration.  Also polypharmacy.  We will get PT and OT evaluation also.  #2 hypertension: Blood pressure is elevated.  Resume home regimen and adjust medications.  #3 hyponatremia: May be the cause of patient's ongoing weakness.  I am stopping her diuretics.  Monitor serum and urine osmolality.  May have been over diuresed.  Monitor sodium level.  #4 diabetes: Sliding scale insulin  #5 diastolic dysfunction CHF: Has lower extremity edema.  May require cardiology consultation.  At this point appears to be somewhat compensated.  #6 paroxysmal atrial fibrillation: Currently rate controlled.  DVT prophylaxis: Heparin Code Status: Full Family Communication: No family at bedside Disposition Plan: To be determined Consults called: Physical therapy Admission status: Inpatient  Severity of Illness: The appropriate patient status for this patient is INPATIENT. Inpatient status is judged to be reasonable and necessary in order to provide the required intensity of service to ensure the patient's safety. The patient's presenting symptoms, physical exam findings, and initial radiographic and laboratory data in the context of their chronic comorbidities is felt to place them at high risk for further clinical deterioration. Furthermore, it is not anticipated that the patient will be medically stable for discharge from the hospital  within 2 midnights of admission. The following factors support the patient status of inpatient.   " The patient's presenting symptoms include generalized weakness. " The worrisome physical exam findings include lower extremity edema. " The initial radiographic and laboratory data are worrisome because of potassium is normal but sodium 130. " The chronic co-morbidities include diastolic CHF.   * I certify that at the point of admission it is my clinical judgment that the patient will require inpatient hospital care spanning beyond 2 midnights from the point of admission due to high intensity of service, high risk for further deterioration and high frequency  of surveillance required.Lonia Blood MD [facility] Pager [phone]  If 7PM-7AM, please contact night-coverage [email or web address] Password [facility]  05/09/2019, 12:25 AM

## 2019-05-09 NOTE — Progress Notes (Signed)
Didn't see the patient. She left AMA prior to the start of my shift.

## 2020-01-03 IMAGING — CR DG CHEST 2V
1 series · 2 of 2 positions shown · non-contrast
Comparison: December 02, 2014

CLINICAL DATA: Chest heaviness and headaches

EXAM:
CHEST  2 VIEW

[Series 1: dg chest 2 view · 0.14mm/px · 2 of 2 slices shown]
[im 1/2]
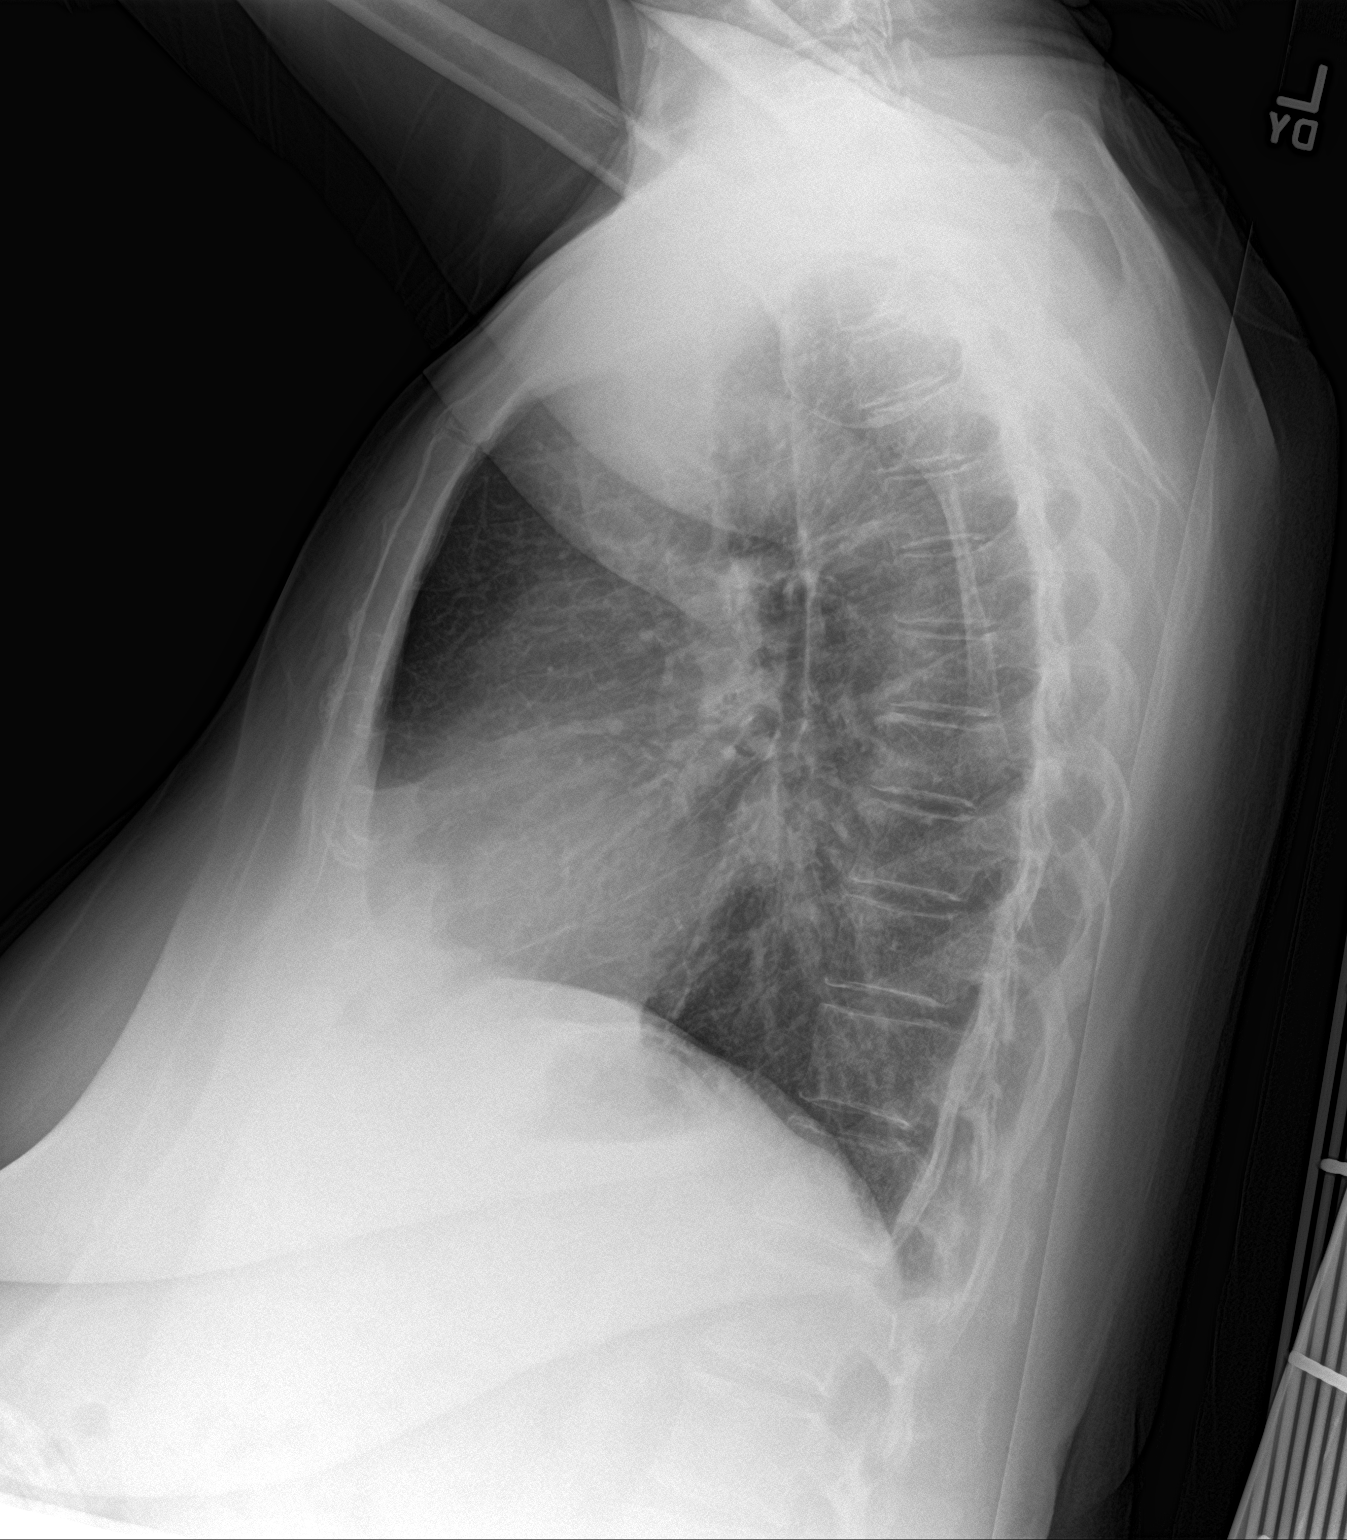
[im 2/2]
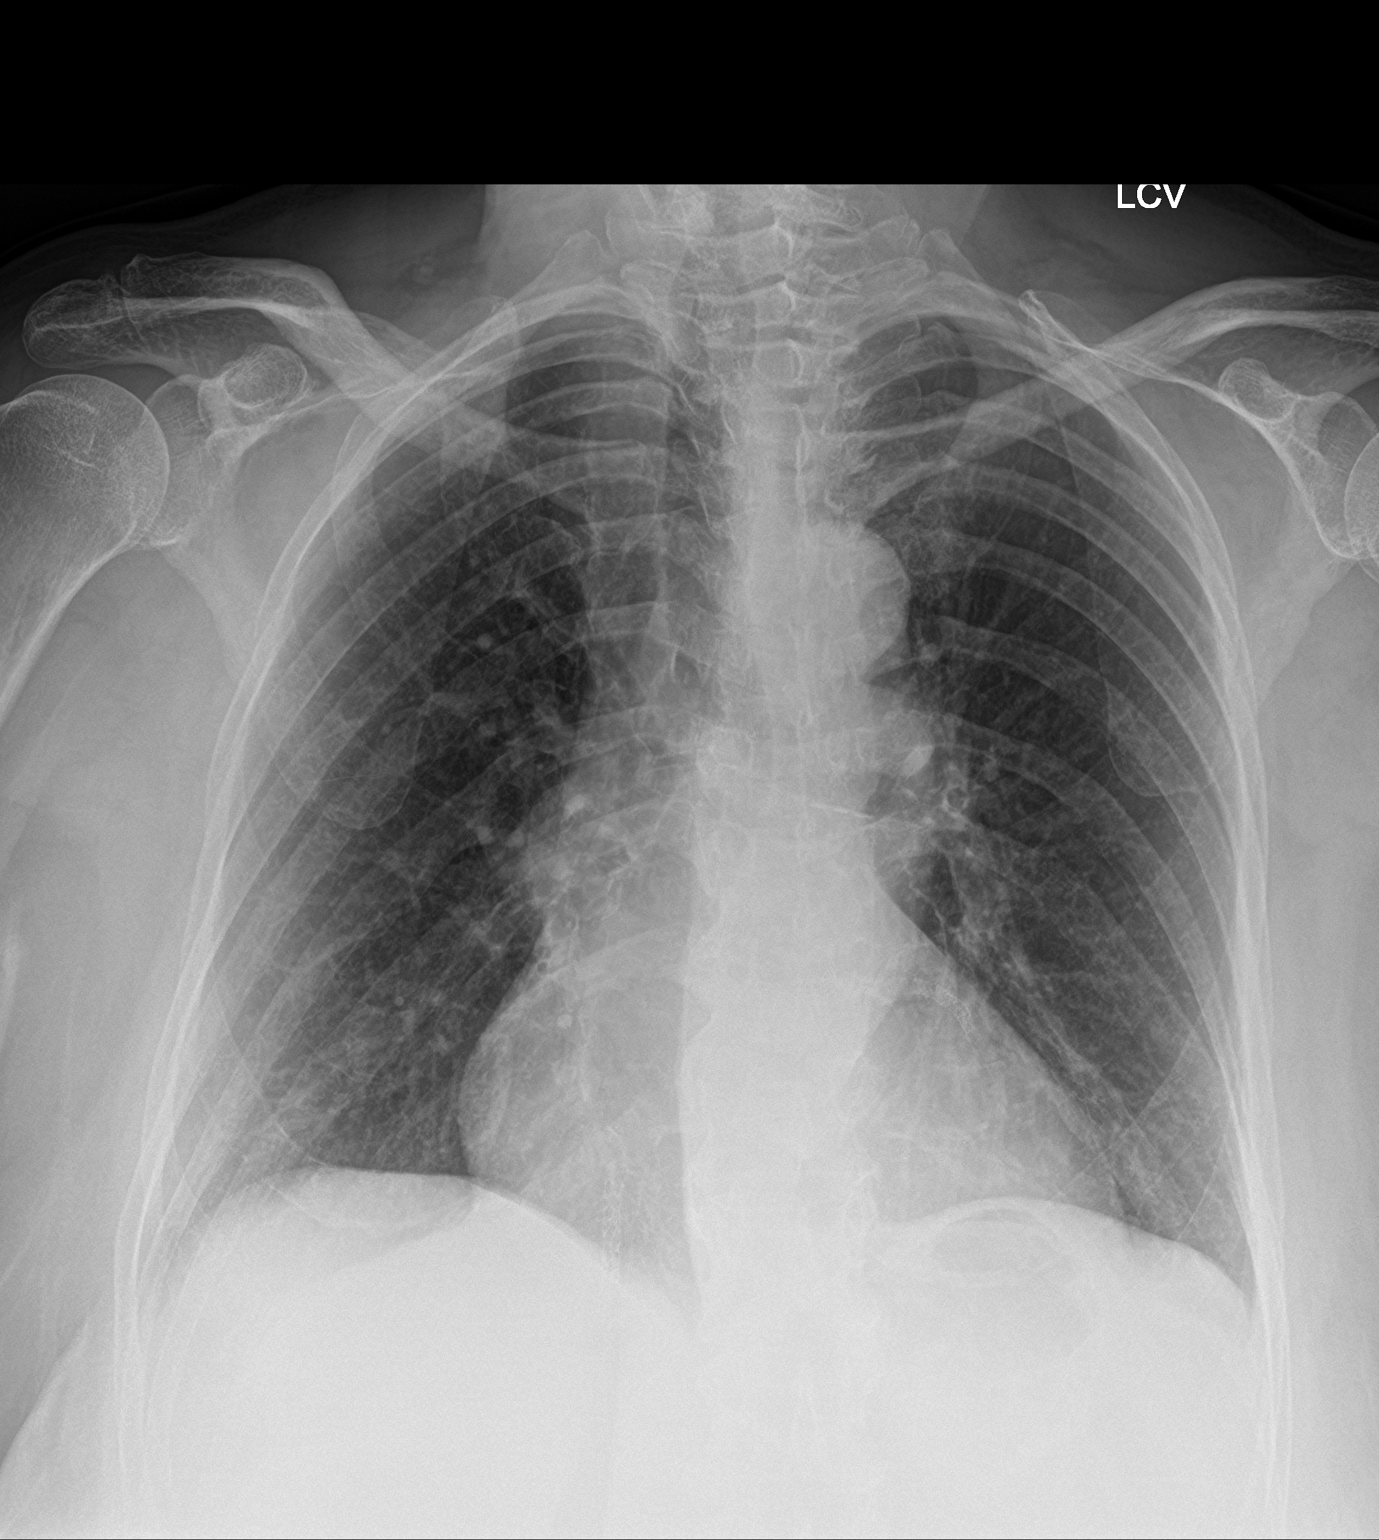

[2 of 2 positions shown; findings below may reference images not displayed]

FINDINGS: The heart size and mediastinal contours are stable. The aorta is
tortuous. There is no focal infiltrate, pulmonary edema, or pleural
effusion. The visualized skeletal structures are unremarkable.
IMPRESSION: No active cardiopulmonary disease.

## 2020-08-06 DIAGNOSIS — E119 Type 2 diabetes mellitus without complications: Secondary | ICD-10-CM

## 2020-08-06 LAB — GLUCOSE, POCT (MANUAL RESULT ENTRY): POC Glucose: 247 mg/dl — AB (ref 70–99)

## 2020-08-06 NOTE — Congregational Nurse Program (Signed)
  Dept: [phone]   Congregational Nurse Program Note  Date of Encounter: 08/06/2020  Past Medical History: Past Medical History:  Diagnosis Date  . Atrial fibrillation (HCC)   . CHF (congestive heart failure) (HCC)   . Chronic pain   . Coronary artery disease   . Diabetes mellitus without complication (HCC)   . GERD (gastroesophageal reflux disease)   . High cholesterol   . Hypertension   . Hypothyroidism     Encounter Details:  CNP Questionnaire - 08/06/20 1429      Questionnaire   Do you give verbal consent to treat you today? Yes    Visit Setting Church or Engineer, technical sales Patient Served At [employer], [employer]    Patient Status Not Applicable    Medical Provider Yes    Insurance Medicare    Intervention Assess (including screenings);Refer;Educate    Food Have food insecurities    Referrals Dental;PCP - other provider    Screening Referrals Diabetic (Hgb A1C, Vision, Podiatry)          [facility] complaining of "mouth pain" and requesting screening of blood sugar and blood pressure. Confidentiality reviewed. Agrees to [employer] documentation. PCP Dr. Marcello Fennel and cardiologist at [facility].  Hx of diabetes (this morning blood sugar about 200 at home after breakfast); states she is Hindu and doesn't eat red meats, pork or chicken. Protein source is usually beans. Non fasting for glucose screen today (after lunch of rice and beans). Takes meds for diabetes and HTN as prescribed. BP 138/60 today. No symptoms. Hx of cardiac disease. Explored "mouth Pain" more thoroughly. State this pain is not like her cardiac pain.Marland Kitchen Results for orders placed or performed in visit on 08/06/20  POCT glucose  Result Value Ref Range   POC Glucose 247 (A) 70 - 99 mg/dl   is on both sides of mouth. Not wearing upper dentures b/c they seem to make the discomfort worse. No obvious sores but dental care to lower teeth overdue. No dental insurance.  Referred to [facility] clinic. Client scheduled to return to this nurse clinic in 1 week for glucose screening and  further nutrition education relating to diabetes. To follow up with MD due to elevated glucose.

## 2020-08-13 DIAGNOSIS — E119 Type 2 diabetes mellitus without complications: Secondary | ICD-10-CM

## 2020-08-13 LAB — GLUCOSE, POCT (MANUAL RESULT ENTRY): POC Glucose: 305 mg/dl — AB (ref 70–99)

## 2020-08-13 NOTE — Congregational Nurse Program (Signed)
  Dept: [phone]   Congregational Nurse Program Note  Date of Encounter: 08/13/2020  Past Medical History: Past Medical History:  Diagnosis Date  . Atrial fibrillation (HCC)   . CHF (congestive heart failure) (HCC)   . Chronic pain   . Coronary artery disease   . Diabetes mellitus without complication (HCC)   . GERD (gastroesophageal reflux disease)   . High cholesterol   . Hypertension   . Hypothyroidism     Encounter Details:  CNP Questionnaire - 08/13/20 1427      Questionnaire   Do you give verbal consent to treat you today? Yes    Visit Setting Church or Engineer, technical sales Patient Served At [employer], [employer]    Patient Status Not Applicable    Medical Provider Yes    Insurance Medicare    Intervention Assess (including screenings);Refer;Educate    Food Have food insecurities    Referrals Dental;PCP - other provider    Screening Referrals Diabetic (Hgb A1C, Vision, Podiatry)          client into nurse only clinic for glucose monitoring and education re: nutrition. Also wants BP screening.  states she got a call from the pharmacy or [facility] (not sure which) telling her that 2 of her meds seem to be conflicting and she said she let her doctor know but hasn't been instructed to change anything.  Completed med review...taking meds as listed in Epic except lasix is qd, not just prn. Reports edema daily. No significant edema noted today. Pt is scheduled to see cardiology, neurosurgeon for neuropathy, and primary care provider over the next couple of weeks. Has ABI scheduled next week. BP 112/58. Glucose abnormal at 305 non fasting..states "I just ate crackers." reviewed basic nutrition information, serving size, less carbs. Client had questions about low salt diet. Basic info and strategies to keep salt intake lower. Client says "I'm trying so hard!."  Uable to demonstrate understanding of nurtiirion back to nurse. States she didn't want nurse to  call dr today with results of glucose. Informed her that the lab values are in her chart...she says it okay for dr to see. Nurse stressed importance of contacting dr with lab results as last two screens have been 247 (3/23) and 305 (today). States she understands importance of her following up with doctor ASAP. Will RTC after all the doctor appoints coming up.  RHermann, RN

## 2020-08-27 DIAGNOSIS — E119 Type 2 diabetes mellitus without complications: Secondary | ICD-10-CM

## 2020-08-27 NOTE — Congregational Nurse Program (Signed)
  Dept: [phone]   Congregational Nurse Program Note  Date of Encounter: 08/27/2020  Past Medical History: Past Medical History:  Diagnosis Date  . Atrial fibrillation (HCC)   . CHF (congestive heart failure) (HCC)   . Chronic pain   . Coronary artery disease   . Diabetes mellitus without complication (HCC)   . GERD (gastroesophageal reflux disease)   . High cholesterol   . Hypertension   . Hypothyroidism     Encounter Details:  CNP Questionnaire - 08/27/20 1153      Questionnaire   Do you give verbal consent to treat you today? Yes    Visit Setting Church or Engineer, technical sales Patient Served At [employer], [employer]    Patient Status Not Applicable    Medical Provider Yes    Insurance Medicare    Intervention Assess (including screenings);Refer;Support    Food Have food insecurities    Referrals Dental    Screening Referrals Annual Wellness Visit          [facility] requesting follow up to recent high blood glucose readings. States she has cut down on rice, potatoes and bread. States screenings at home have been more around 107 in mornings. Also complains of edema to ankles and legs...pt followed up with MD yesterday and Lasix was increased to 40 mg qd. Seeing marked improvement.noted +1 pitting edema limited to ankles bilaterally. Client states she was also given a prescription for a four pronged cane to increase stability with ambulation but hasn't been able to afford.  States she still has need of dentist but wasn't able to reach dental clinic.  BP today 126/60, Blood glucose much improved at 178 non fasting (compared to 305 two weeks ago at same time in clinic). Nurse attempted unsuccessfully to reach dental clinic while pt in clinic. Pt agrees to try again. Nurse to assess if there is access to four prong cane in supplies she has access to.  Reinforced improved glucose with decrease in starchy foods. Continued discussion on  healthy food choices for diabetes. Scheduled follow up with nurse in 1 week. Rhermann, RN

## 2020-08-28 LAB — GLUCOSE, POCT (MANUAL RESULT ENTRY): POC Glucose: 178 mg/dl — AB (ref 70–99)

## 2020-09-03 DIAGNOSIS — E119 Type 2 diabetes mellitus without complications: Secondary | ICD-10-CM

## 2020-09-03 LAB — GLUCOSE, POCT (MANUAL RESULT ENTRY): POC Glucose: 237 mg/dl — AB (ref 70–99)

## 2020-09-03 NOTE — Congregational Nurse Program (Signed)
  Dept: [phone]   Congregational Nurse Program Note  Date of Encounter: 09/03/2020  Past Medical History: Past Medical History:  Diagnosis Date  . Atrial fibrillation (HCC)   . CHF (congestive heart failure) (HCC)   . Chronic pain   . Coronary artery disease   . Diabetes mellitus without complication (HCC)   . GERD (gastroesophageal reflux disease)   . High cholesterol   . Hypertension   . Hypothyroidism     Encounter Details:  [facility] into [facility] to continue glucose monitoring and to discuss increasing knee pain.  Blood sugar was 77 last night so added orange juice; at 2 am 109 so "too low" so she took orange juice and date. Now blood glucose is 237 (non-fasting). Counseled to follow up with MD re: results today. Discussed what glucose levels are acceptable and no need to add sugar. Regarding leg pain. Complains that daily the pain is increasing with little relief from motrin, arthritis creams, voltran and homeopathic mustard and garlic packs; client states she called her dr and he says it is too early for steroid injection; has ortho appointment 4/25and EMI scheduled for 5/20. States has not been able to afford the 4-prong cane doctor recommended. Voucher prepared for [employer] to access if cane available there. [facility] will notify Masha when one is available. Encouraged client to use the walker that she has as much as possible for stability.  Continues to have pain in her mouth and unable to use her lower dentures. Has left messages at [facility] dental [facility] without return call. Called while in clinic and scheduled an appointment for 10:45 am tomorrow. Reinforced compliance with all upcoming appointments.  Client to RTC to see this nurse as needed.  RHermann, RN

## 2020-11-10 ENCOUNTER — Emergency Department
Admission: EM | Admit: 2020-11-10 | Discharge: 2020-11-10 | Disposition: A | Discharge status: Home / Self Care | Payer: Medicare Other | Attending: Emergency Medicine | Admitting: Emergency Medicine

## 2020-11-10 ENCOUNTER — Encounter: Payer: Self-pay | Admitting: Intensive Care

## 2020-11-10 ENCOUNTER — Other Ambulatory Visit: Payer: Self-pay

## 2020-11-10 ENCOUNTER — Emergency Department: Payer: Medicare Other

## 2020-11-10 DIAGNOSIS — I10 Essential (primary) hypertension: Secondary | ICD-10-CM | POA: Diagnosis not present

## 2020-11-10 DIAGNOSIS — Z7902 Long term (current) use of antithrombotics/antiplatelets: Secondary | ICD-10-CM | POA: Diagnosis not present

## 2020-11-10 DIAGNOSIS — R079 Chest pain, unspecified: Secondary | ICD-10-CM

## 2020-11-10 DIAGNOSIS — Z7984 Long term (current) use of oral hypoglycemic drugs: Secondary | ICD-10-CM | POA: Diagnosis not present

## 2020-11-10 DIAGNOSIS — E039 Hypothyroidism, unspecified: Secondary | ICD-10-CM | POA: Diagnosis not present

## 2020-11-10 DIAGNOSIS — I5032 Chronic diastolic (congestive) heart failure: Secondary | ICD-10-CM | POA: Insufficient documentation

## 2020-11-10 DIAGNOSIS — Z79899 Other long term (current) drug therapy: Secondary | ICD-10-CM | POA: Diagnosis not present

## 2020-11-10 DIAGNOSIS — E119 Type 2 diabetes mellitus without complications: Secondary | ICD-10-CM | POA: Diagnosis not present

## 2020-11-10 DIAGNOSIS — I2510 Atherosclerotic heart disease of native coronary artery without angina pectoris: Secondary | ICD-10-CM | POA: Insufficient documentation

## 2020-11-10 DIAGNOSIS — I110 Hypertensive heart disease with heart failure: Secondary | ICD-10-CM | POA: Insufficient documentation

## 2020-11-10 DIAGNOSIS — Z7982 Long term (current) use of aspirin: Secondary | ICD-10-CM | POA: Diagnosis not present

## 2020-11-10 LAB — BRAIN NATRIURETIC PEPTIDE: B Natriuretic Peptide: 70 pg/mL (ref 0.0–100.0)

## 2020-11-10 LAB — CBC
HCT: 38.3 % (ref 36.0–46.0)
Hemoglobin: 13.3 g/dL (ref 12.0–15.0)
MCH: 30.1 pg (ref 26.0–34.0)
MCHC: 34.7 g/dL (ref 30.0–36.0)
MCV: 86.7 fL (ref 80.0–100.0)
Platelets: 590 10*3/uL — ABNORMAL HIGH (ref 150–400)
RBC: 4.42 MIL/uL (ref 3.87–5.11)
RDW: 13 % (ref 11.5–15.5)
WBC: 9.5 10*3/uL (ref 4.0–10.5)
nRBC: 0 % (ref 0.0–0.2)

## 2020-11-10 LAB — BASIC METABOLIC PANEL
Anion gap: 8 (ref 5–15)
BUN: 13 mg/dL (ref 8–23)
CO2: 25 mmol/L (ref 22–32)
Calcium: 8.5 mg/dL — ABNORMAL LOW (ref 8.9–10.3)
Chloride: 89 mmol/L — ABNORMAL LOW (ref 98–111)
Creatinine, Ser: 0.68 mg/dL (ref 0.44–1.00)
GFR, Estimated: >60 mL/min (ref >60)
Glucose, Bld: 104 mg/dL — ABNORMAL HIGH (ref 70–99)
Potassium: 3.7 mmol/L (ref 3.5–5.1)
Sodium: 122 mmol/L — ABNORMAL LOW (ref 135–145)

## 2020-11-10 LAB — MAGNESIUM: Magnesium: 1.8 mg/dL (ref 1.7–2.4)

## 2020-11-10 LAB — LIPASE, BLOOD: Lipase: 29 U/L (ref 11–51)

## 2020-11-10 LAB — HEPATIC FUNCTION PANEL
ALT: 21 U/L (ref 0–44)
AST: 14 U/L — ABNORMAL LOW (ref 15–41)
Albumin: 3.9 g/dL (ref 3.5–5.0)
Alkaline Phosphatase: 89 U/L (ref 38–126)
Bilirubin, Direct: <0.1 mg/dL (ref 0.0–0.2)
Total Bilirubin: 1.1 mg/dL (ref 0.3–1.2)
Total Protein: 6.6 g/dL (ref 6.5–8.1)

## 2020-11-10 LAB — TROPONIN I (HIGH SENSITIVITY): Troponin I (High Sensitivity): 5 ng/L (ref <18)

## 2020-11-10 MED ORDER — AMLODIPINE BESYLATE 5 MG PO TABS: 2.5000 mg | ORAL_TABLET | Freq: Two times a day (BID) | ORAL | 3 refills | Status: DC

## 2020-11-10 NOTE — ED Triage Notes (Signed)
Patient c/o central cp associated with sob and back pain that started this AM

## 2020-11-10 NOTE — ED Notes (Signed)
Full rainbow sent to lab.  

## 2020-11-10 NOTE — Discharge Instructions (Addendum)
Follow-up with Dr. Juliann Pares in the morning.  Increase your amlodipine to 2.5mg  twice daily Return to the ER if worsening

## 2020-11-10 NOTE — ED Notes (Signed)
Cardiologist at bedside.  

## 2020-11-10 NOTE — ED Provider Notes (Signed)
[facility] Emergency Department Provider Note  ____________________________________________   None    (approximate)  I have reviewed the triage vital signs and the nursing notes.   HISTORY  Chief Complaint Chest Pain    HPI ARBOR LEER is a 75 y.o. female presents emergency department complaining of centralized chest pain that radiates through to her back.  Had an episode at approximately 630 this morning.  Went to physical therapy and her blood pressure was high and her heart rate was low.  Due to the centralized chest pain they brought her to the ED.  She denies any nausea/vomiting, no sweating, patient has history of A. fib and CHF.  She states that she feels a little twinge of pain at this time but not severe pain.  States she did notice that her glucose was low at approximately 4:30 AM it was at 59 and she drank some orange juice which elevated to 120  Past Medical History:  Diagnosis Date   Atrial fibrillation (HCC)    CHF (congestive heart failure) (HCC)    Chronic pain    Coronary artery disease    Diabetes mellitus without complication (HCC)    GERD (gastroesophageal reflux disease)    High cholesterol    Hypertension    Hypothyroidism     Patient Active Problem List   Diagnosis Date Noted   Weakness generalized 05/09/2019   Chronic diastolic heart failure (HCC) 03/23/2018   HTN (hypertension) 03/23/2018   DM (diabetes mellitus) (HCC) 03/23/2018   SOB (shortness of breath) 02/01/2018   Dizziness 08/01/2017   TIA (transient ischemic attack) 10/17/2015   Hyponatremia 10/17/2015   Anemia 10/17/2015   Chest pain 10/16/2015   Left-sided weakness 10/16/2015   Hypotension 10/16/2015   Bradycardia 10/16/2015   Hypoglycemia 10/16/2015    Past Surgical History:  Procedure Laterality Date   BACK SURGERY     CARDIAC CATHETERIZATION Left 10/23/2015   Procedure: Left Heart Cath and Coronary Angiography;  Surgeon: Alwyn Pea, MD;   Location: [facility] INVASIVE CV LAB;  Service: Cardiovascular;  Laterality: Left;   COLONOSCOPY WITH PROPOFOL N/A 01/03/2017   Procedure: COLONOSCOPY WITH PROPOFOL;  Surgeon: Scot Jun, MD;  Location: [facility] ENDOSCOPY;  Service: Endoscopy;  Laterality: N/A;   CORONARY STENT PLACEMENT     x 4 at [facility]. x 1 by [place] in 2004   LEFT HEART CATH AND CORONARY ANGIOGRAPHY N/A 06/08/2017   Procedure: LEFT HEART CATH AND CORONARY ANGIOGRAPHY;  Surgeon: Alwyn Pea, MD;  Location: [facility] INVASIVE CV LAB;  Service: Cardiovascular;  Laterality: N/A;    Prior to Admission medications   Medication Sig Start Date End Date Taking? Authorizing Provider  amLODipine (NORVASC) 5 MG tablet Take 0.5 tablets (2.5 mg total) by mouth 2 (two) times daily. 11/10/20   Sherrie Mustache Roselyn Bering, PA-C  aspirin EC 81 MG tablet Take 81 mg by mouth daily.    [provider]  atorvastatin (LIPITOR) 80 MG tablet Take 80 mg by mouth daily.    [provider]  Biotin w/ Vitamins C & E (HAIR/SKIN/NAILS PO) Take 1 tablet by mouth daily.    [provider]  chlorthalidone (HYGROTON) 25 MG tablet Take 12.5 mg by mouth every evening.     [provider]  clopidogrel (PLAVIX) 75 MG tablet Take 75 mg by mouth daily.    [provider]  diclofenac sodium (VOLTAREN) 1 % GEL Apply 2 g topically 4 (four) times daily.  [provider]  empagliflozin (JARDIANCE) 10 MG TABS tablet Take 5 mg by mouth daily.     [provider]  ferrous sulfate 325 (65 FE) MG tablet Take 1 tablet by mouth 2 (two) times daily with a meal.  11/10/16   [provider]  furosemide (LASIX) 20 MG tablet Take 20 mg by mouth daily as needed for edema.    [provider]  gabapentin (NEURONTIN) 400 MG capsule Take 800 mg by mouth.     [provider]  glipiZIDE (GLUCOTROL XL) 5 MG 24 hr tablet Take 1 tablet (5 mg total) by mouth 2 (two) times daily. 08/03/17   Altamese Dilling,  MD  isosorbide mononitrate (IMDUR) 30 MG 24 hr tablet Take 30 mg by mouth daily.     [provider]  levothyroxine (SYNTHROID, LEVOTHROID) 50 MCG tablet Take 50 mcg by mouth daily before breakfast.     [provider]  lisinopril (PRINIVIL,ZESTRIL) 20 MG tablet Take 20 mg by mouth daily.    [provider]  magnesium oxide (MAG-OX) 400 MG tablet Take 400 mg by mouth at bedtime.    [provider]  meclizine (ANTIVERT) 25 MG tablet Take 25 mg by mouth 3 (three) times daily as needed for dizziness.    [provider]  metFORMIN (GLUCOPHAGE) 1000 MG tablet Take 1,000 mg by mouth daily with breakfast.     [provider]  niacin 500 MG tablet Take 500 mg by mouth at bedtime.     [provider]  nitroGLYCERIN (NITROLINGUAL) 0.4 MG/SPRAY spray Place 1 spray under the tongue every 5 (five) minutes x 3 doses as needed for chest pain.    [provider]  olmesartan (BENICAR) 40 MG tablet Take 40 mg by mouth daily.    [provider]  pantoprazole (PROTONIX) 40 MG tablet Take 40 mg by mouth every evening.     [provider]  potassium chloride (K-DUR,KLOR-CON) 10 MEQ tablet Take 10 mEq by mouth daily as needed (with furosemide).    [provider]  tiZANidine (ZANAFLEX) 2 MG tablet Take 4 mg by mouth at bedtime.     [provider]    Allergies Biaxin [clarithromycin], Homatropine, Januvia [sitagliptin], Macrodantin [nitrofurantoin macrocrystal], Naproxen, Propoxyphene, Tramadol, and Penicillins  Family History  Problem Relation Age of Onset   Breast cancer Neg Hx     Social History Social History   Tobacco Use   Smoking status: Never   Smokeless tobacco: Never  Vaping Use   Vaping Use: Never used  Substance Use Topics   Alcohol use: No   Drug use: No    Review of Systems  Constitutional: No fever/chills Eyes: No visual changes. ENT: No sore throat. Respiratory: Denies  cough Cardiovascular: Positive chest pain Gastrointestinal: Denies abdominal pain Genitourinary: Negative for dysuria. Musculoskeletal: Negative for back pain. Skin: Negative for rash. Psychiatric: no mood changes,     ____________________________________________   PHYSICAL EXAM:  VITAL SIGNS: ED Triage Vitals  Enc Vitals Group     BP 11/10/20 1115 (!) 163/130     Pulse Rate 11/10/20 1115 64     Resp 11/10/20 1115 20     Temp 11/10/20 1115 97.9 F (36.6 C)     Temp Source 11/10/20 1115 Oral     SpO2 11/10/20 1115 99 %     Weight 11/10/20 1117 150 lb (68 kg)     Height 11/10/20 1117 5' (1.524 m)     Head  Circumference --      Peak Flow --      Pain Score 11/10/20 1117 4     Pain Loc --      Pain Edu? --      Excl. in GC? --     Constitutional: Alert and oriented. Well appearing and in no acute distress. Eyes: Conjunctivae are normal.  Head: Atraumatic. Nose: No congestion/rhinnorhea. Mouth/Throat: Mucous membranes are moist.   Neck:  supple no lymphadenopathy noted Cardiovascular: Normal rate, regular rhythm. Heart sounds are normal Respiratory: Normal respiratory effort.  No retractions, lungs c t a  Abd: soft nontender bs normal all 4 quad GU: deferred Musculoskeletal: FROM all extremities, warm and well perfused Neurologic:  Normal speech and language.  Skin:  Skin is warm, dry and intact. No rash noted. Psychiatric: Mood and affect are normal. Speech and behavior are normal.  ____________________________________________   LABS (all labs ordered are listed, but only abnormal results are displayed)  Labs Reviewed  BASIC METABOLIC PANEL - Abnormal; Notable for the following components:      Result Value   Sodium 122 (*)    Chloride 89 (*)    Glucose, Bld 104 (*)    Calcium 8.5 (*)    All other components within normal limits  CBC - Abnormal; Notable for the following components:   Platelets 590 (*)    All other components within normal limits  HEPATIC  FUNCTION PANEL - Abnormal; Notable for the following components:   AST 14 (*)    All other components within normal limits  LIPASE, BLOOD  BRAIN NATRIURETIC PEPTIDE  MAGNESIUM  CBG MONITORING, ED  TROPONIN I (HIGH SENSITIVITY)   ____________________________________________   ____________________________________________  RADIOLOGY  Chest x-ray  ____________________________________________   PROCEDURES  Ekg, see physician read  ____________________________________________   INITIAL IMPRESSION / ASSESSMENT AND PLAN / ED COURSE  Pertinent labs & imaging results that were available during my care of the patient were reviewed by me and considered in my medical decision making (see chart for details).   Patient is 75 year old female presents with/chest pain.  See HPI.  Physical exam shows patient is stable at this time.  DDx: MI, CHF, dissection, angina  CBC has elevated platelets at 590 otherwise normal  Chest x-ray reviewed by me confirmed by radiology to have some centralized vascular congestion  EKG shows sinus rhythm with second-degree AV block, see physician  Troponin is normal, BNP is normal, lipase is normal, basic metabolic panel has a low sodium of 122 which is in her normal range per her old chart.  Consult to Dr. Juliann Paresallwood from cardiology He came in to see the patient.  Feels that she can be discharged.  He will see her in the office tomorrow.  Wants me to increase her Norvasc to 2.5 mg twice daily as her blood pressure is still high. Is discharged in stable condition.   Carl BestShobhna J Micallef was evaluated in Emergency Department on 11/10/2020 for the symptoms described in the history of present illness. She was evaluated in the context of the global COVID-19 pandemic, which necessitated consideration that the patient might be at risk for infection with the SARS-CoV-2 virus that causes COVID-19. Institutional protocols and algorithms that pertain to the evaluation of  patients at risk for COVID-19 are in a state of rapid change based on information released by regulatory bodies including the CDC and federal and state organizations. These policies and algorithms were followed during the patient's care in the ED.  As part of my medical decision making, I reviewed the following data within the electronic MEDICAL RECORD NUMBER Nursing notes reviewed and incorporated, Labs reviewed , EKG interpreted second-degree heart block, see physician read, Old EKG reviewed, Old chart reviewed, Radiograph reviewed , A consult was requested and obtained from this/these consultant(s) Cardiology, Notes from prior ED visits, and [place] Controlled Substance Database  ____________________________________________   FINAL CLINICAL IMPRESSION(S) / ED DIAGNOSES  Final diagnoses:  Nonspecific chest pain      NEW MEDICATIONS STARTED DURING THIS VISIT:  Current Discharge Medication List       Note:  This document was prepared using Dragon voice recognition software and may include unintentional dictation errors.    Faythe Ghee, PA-C 11/10/20 1241    Merwyn Katos, MD 11/11/20 [id number]

## 2021-02-04 IMAGING — US US EXTREM LOW VENOUS*R*
1 series · 13 of 24 positions shown · non-contrast
Comparison: Ultrasound 05/13/2014.

CLINICAL DATA: Leg edema.  Pain.



[Series 1: us extrem low venous*right* · 0.06mm/px · 13 of 35 slices shown]
[im 1/35]
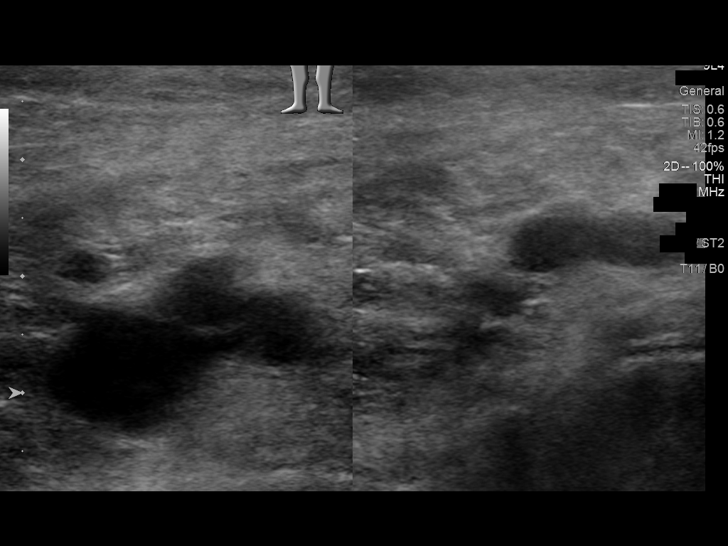
[im 3/35]
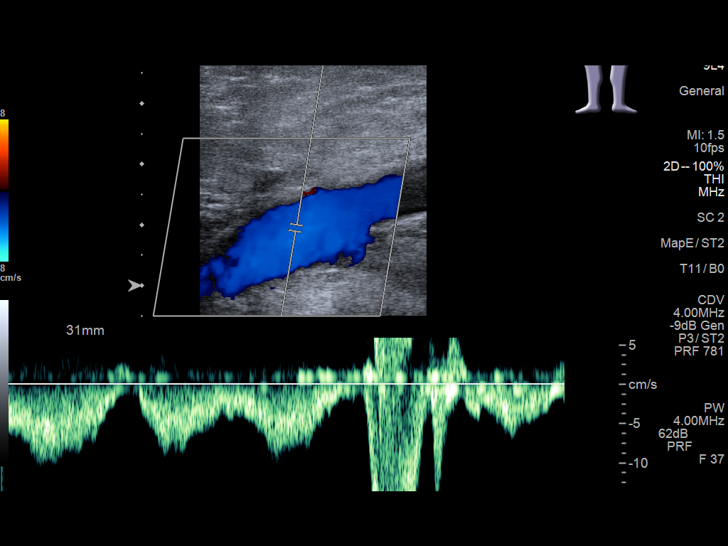
[im 6/35]
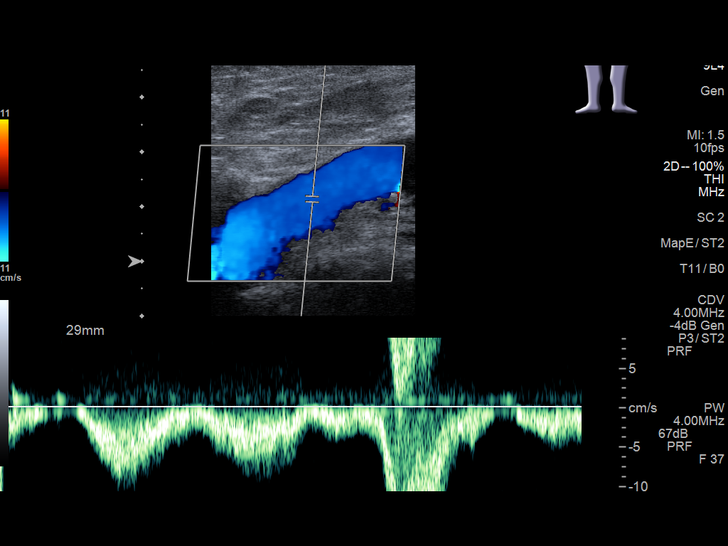
[im 9/35]
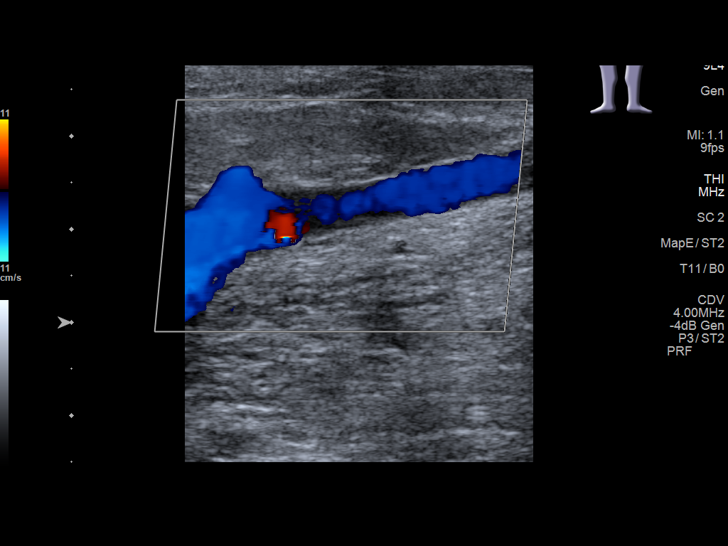
[im 12/35]
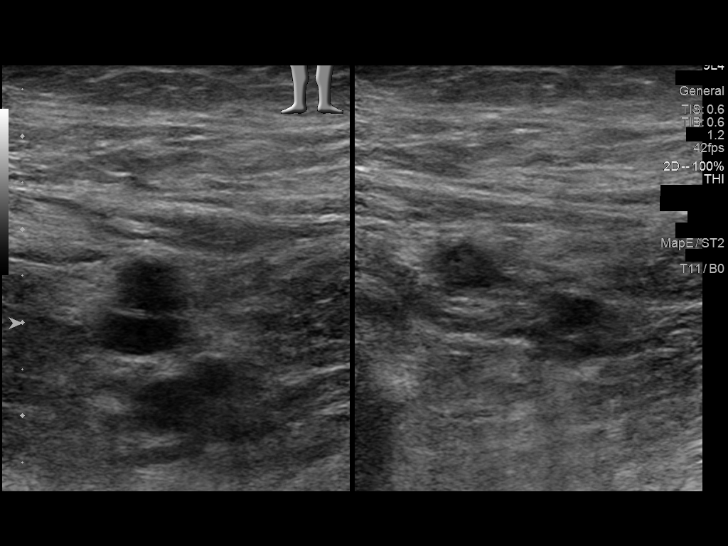
[im 15/35]
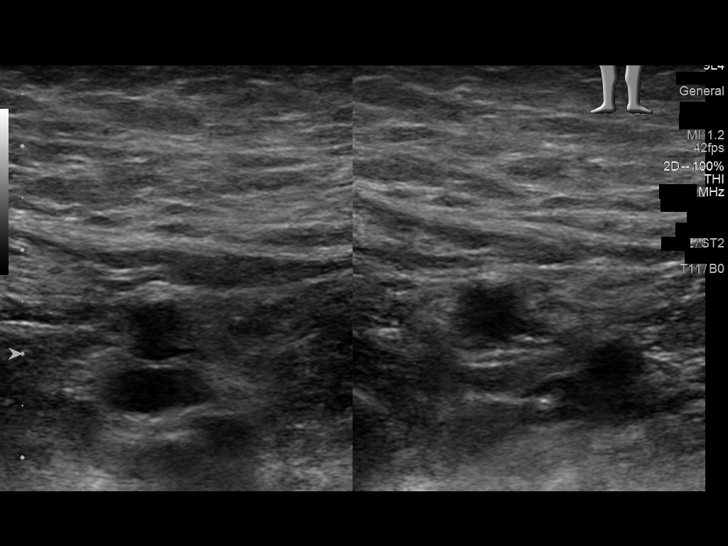
[im 18/35]
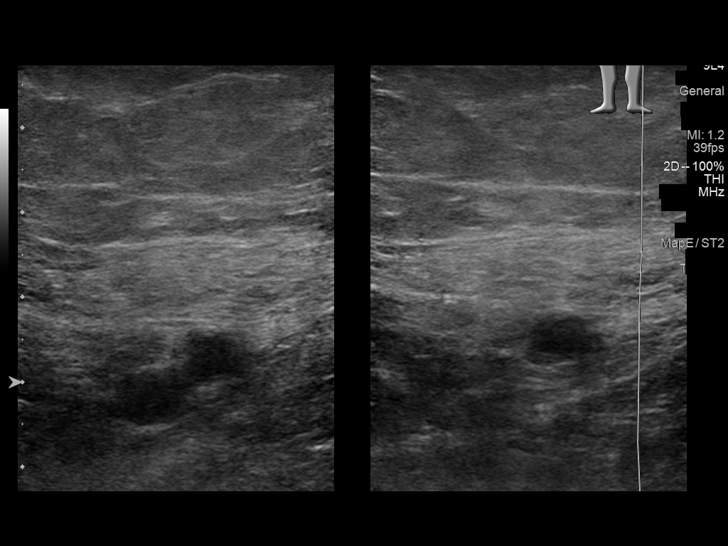
[im 20/35]
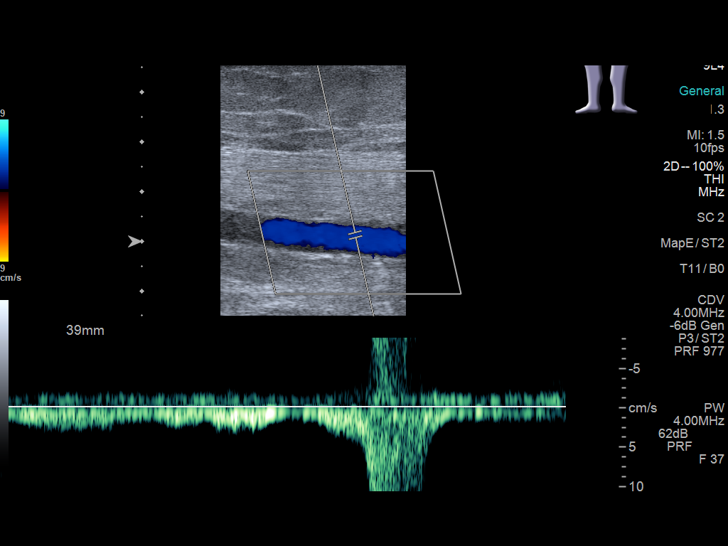
[im 23/35]
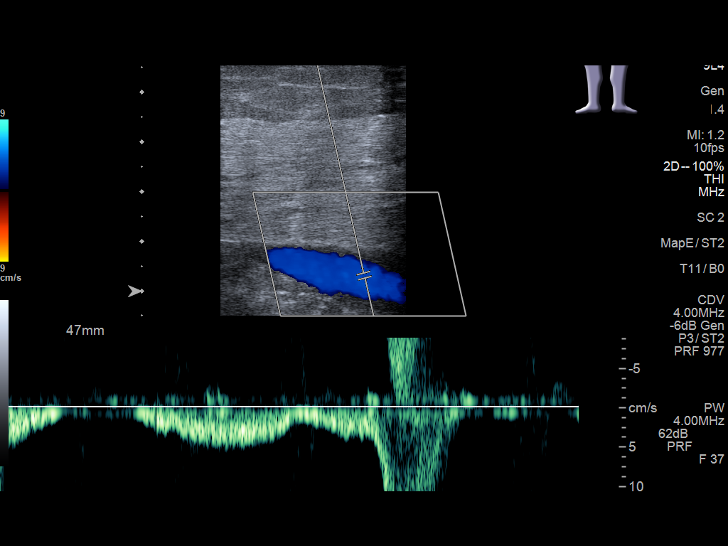
[im 26/35]
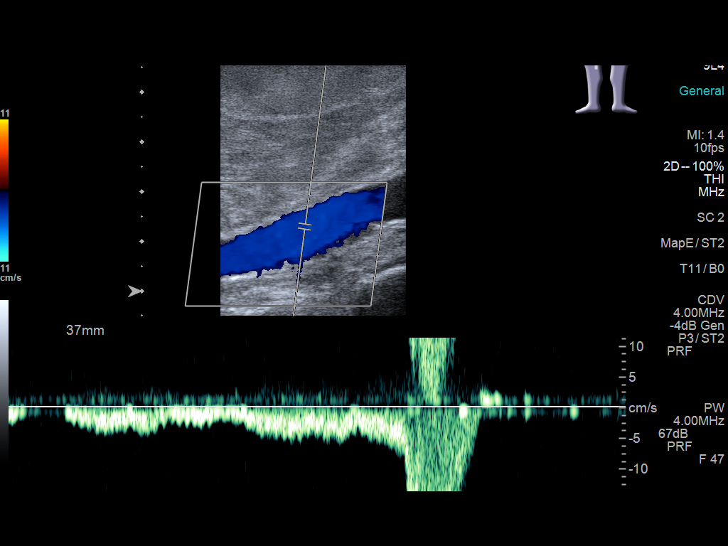
[im 29/35]
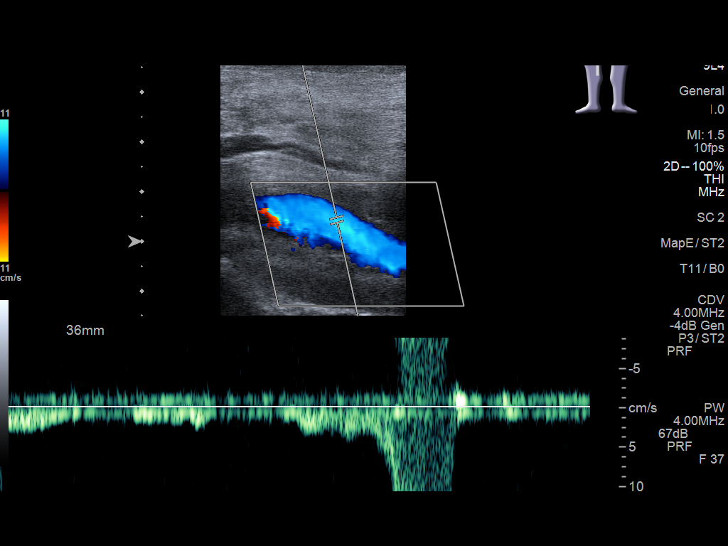
[im 32/35]
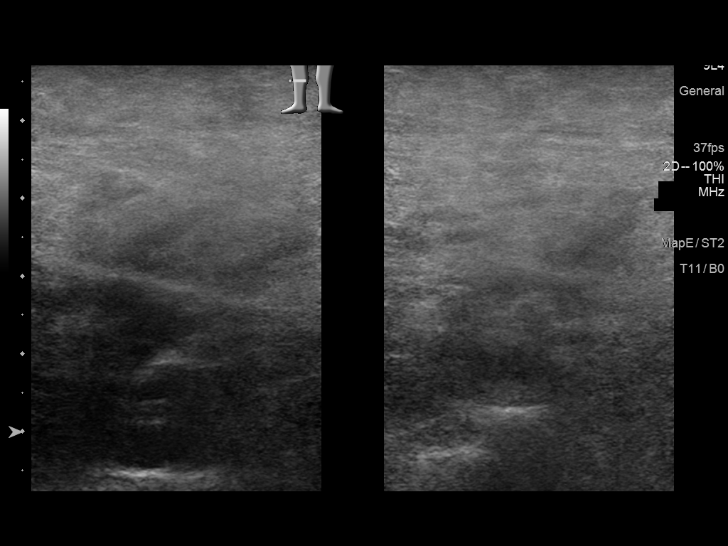
[im 35/35]
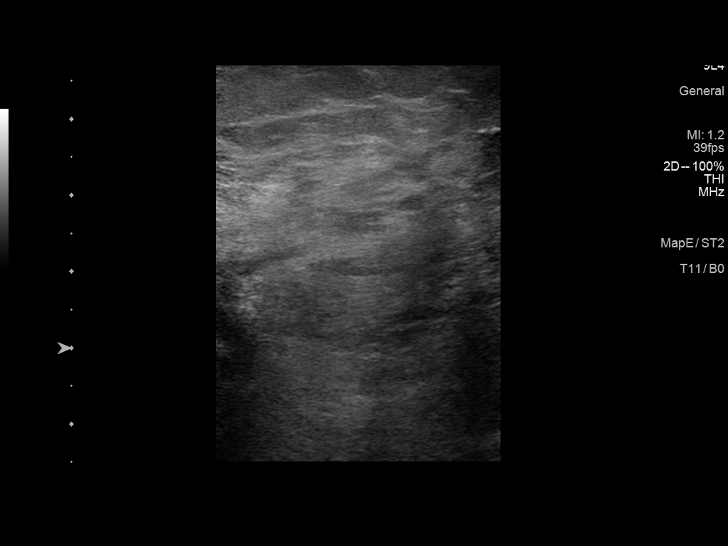

[13 of 24 positions shown; findings below may reference images not displayed]

FINDINGS: Contralateral Common Femoral Vein: Respiratory phasicity is normal
and symmetric with the symptomatic side. No evidence of thrombus.
Normal compressibility.

Common Femoral Vein: No evidence of thrombus. Normal
compressibility, respiratory phasicity and response to augmentation.

Saphenofemoral Junction: No evidence of thrombus. Normal
compressibility and flow on color Doppler imaging.

Profunda Femoral Vein: No evidence of thrombus. Normal
compressibility and flow on color Doppler imaging.

Femoral Vein: No evidence of thrombus. Normal compressibility,
respiratory phasicity and response to augmentation.

Popliteal Vein: No evidence of thrombus. Normal compressibility,
respiratory phasicity and response to augmentation.

Calf Veins: No evidence of thrombus. Normal compressibility and flow
on color Doppler imaging.

Other Findings:  None.
IMPRESSION: No evidence of deep venous thrombosis.

## 2021-05-28 DIAGNOSIS — E1165 Type 2 diabetes mellitus with hyperglycemia: Secondary | ICD-10-CM | POA: Diagnosis not present

## 2021-05-28 DIAGNOSIS — I10 Essential (primary) hypertension: Secondary | ICD-10-CM | POA: Diagnosis not present

## 2021-05-28 DIAGNOSIS — Z955 Presence of coronary angioplasty implant and graft: Secondary | ICD-10-CM | POA: Diagnosis not present

## 2021-05-28 DIAGNOSIS — I25118 Atherosclerotic heart disease of native coronary artery with other forms of angina pectoris: Secondary | ICD-10-CM | POA: Diagnosis not present

## 2021-06-04 DIAGNOSIS — E1165 Type 2 diabetes mellitus with hyperglycemia: Secondary | ICD-10-CM | POA: Diagnosis not present

## 2021-06-04 DIAGNOSIS — M25561 Pain in right knee: Secondary | ICD-10-CM | POA: Diagnosis not present

## 2021-06-04 DIAGNOSIS — M1712 Unilateral primary osteoarthritis, left knee: Secondary | ICD-10-CM | POA: Diagnosis not present

## 2021-06-04 DIAGNOSIS — F330 Major depressive disorder, recurrent, mild: Secondary | ICD-10-CM | POA: Diagnosis not present

## 2021-06-04 DIAGNOSIS — M79675 Pain in left toe(s): Secondary | ICD-10-CM | POA: Diagnosis not present

## 2021-06-04 DIAGNOSIS — I10 Essential (primary) hypertension: Secondary | ICD-10-CM | POA: Diagnosis not present

## 2021-06-04 DIAGNOSIS — E039 Hypothyroidism, unspecified: Secondary | ICD-10-CM | POA: Diagnosis not present

## 2021-06-04 DIAGNOSIS — R262 Difficulty in walking, not elsewhere classified: Secondary | ICD-10-CM | POA: Diagnosis not present

## 2021-06-04 DIAGNOSIS — R600 Localized edema: Secondary | ICD-10-CM | POA: Diagnosis not present

## 2021-06-11 ENCOUNTER — Other Ambulatory Visit: Payer: Self-pay | Admitting: Internal Medicine

## 2021-06-11 ENCOUNTER — Ambulatory Visit: Payer: Self-pay

## 2021-06-11 DIAGNOSIS — M79604 Pain in right leg: Secondary | ICD-10-CM | POA: Diagnosis not present

## 2021-06-11 DIAGNOSIS — Z1231 Encounter for screening mammogram for malignant neoplasm of breast: Secondary | ICD-10-CM | POA: Diagnosis not present

## 2021-06-11 DIAGNOSIS — Z0000 Encounter for general adult medical examination without abnormal findings: Secondary | ICD-10-CM | POA: Diagnosis not present

## 2021-06-11 DIAGNOSIS — I10 Essential (primary) hypertension: Secondary | ICD-10-CM | POA: Diagnosis not present

## 2021-06-11 DIAGNOSIS — E039 Hypothyroidism, unspecified: Secondary | ICD-10-CM | POA: Diagnosis not present

## 2021-06-11 DIAGNOSIS — M25561 Pain in right knee: Secondary | ICD-10-CM | POA: Diagnosis not present

## 2021-06-11 DIAGNOSIS — M79605 Pain in left leg: Secondary | ICD-10-CM | POA: Diagnosis not present

## 2021-06-11 DIAGNOSIS — E1140 Type 2 diabetes mellitus with diabetic neuropathy, unspecified: Secondary | ICD-10-CM | POA: Diagnosis not present

## 2021-06-12 ENCOUNTER — Other Ambulatory Visit: Payer: Self-pay

## 2021-06-12 ENCOUNTER — Other Ambulatory Visit: Payer: Self-pay | Admitting: Internal Medicine

## 2021-06-12 ENCOUNTER — Ambulatory Visit
Admission: RE | Admit: 2021-06-12 | Discharge: 2021-06-12 | Disposition: A | Discharge status: Home / Self Care | Payer: Medicare HMO | Source: Ambulatory Visit | Attending: Internal Medicine | Admitting: Internal Medicine

## 2021-06-12 DIAGNOSIS — M7121 Synovial cyst of popliteal space [Baker], right knee: Secondary | ICD-10-CM | POA: Diagnosis not present

## 2021-06-12 DIAGNOSIS — M79605 Pain in left leg: Secondary | ICD-10-CM | POA: Diagnosis not present

## 2021-06-12 DIAGNOSIS — M79604 Pain in right leg: Secondary | ICD-10-CM | POA: Diagnosis not present

## 2021-06-12 DIAGNOSIS — M79661 Pain in right lower leg: Secondary | ICD-10-CM | POA: Diagnosis not present

## 2021-06-12 DIAGNOSIS — M79662 Pain in left lower leg: Secondary | ICD-10-CM | POA: Diagnosis not present

## 2021-06-12 DIAGNOSIS — Z1231 Encounter for screening mammogram for malignant neoplasm of breast: Secondary | ICD-10-CM

## 2021-07-02 DIAGNOSIS — E785 Hyperlipidemia, unspecified: Secondary | ICD-10-CM | POA: Diagnosis not present

## 2021-07-02 DIAGNOSIS — Z955 Presence of coronary angioplasty implant and graft: Secondary | ICD-10-CM | POA: Diagnosis not present

## 2021-07-02 DIAGNOSIS — E1169 Type 2 diabetes mellitus with other specified complication: Secondary | ICD-10-CM | POA: Diagnosis not present

## 2021-07-02 DIAGNOSIS — I25118 Atherosclerotic heart disease of native coronary artery with other forms of angina pectoris: Secondary | ICD-10-CM | POA: Diagnosis not present

## 2021-07-02 DIAGNOSIS — E1165 Type 2 diabetes mellitus with hyperglycemia: Secondary | ICD-10-CM | POA: Diagnosis not present

## 2021-07-02 DIAGNOSIS — I10 Essential (primary) hypertension: Secondary | ICD-10-CM | POA: Diagnosis not present

## 2021-07-02 DIAGNOSIS — R001 Bradycardia, unspecified: Secondary | ICD-10-CM | POA: Diagnosis not present

## 2021-07-13 DIAGNOSIS — L84 Corns and callosities: Secondary | ICD-10-CM | POA: Diagnosis not present

## 2021-07-13 DIAGNOSIS — I10 Essential (primary) hypertension: Secondary | ICD-10-CM | POA: Diagnosis not present

## 2021-07-13 DIAGNOSIS — M25572 Pain in left ankle and joints of left foot: Secondary | ICD-10-CM | POA: Diagnosis not present

## 2021-07-13 DIAGNOSIS — E1140 Type 2 diabetes mellitus with diabetic neuropathy, unspecified: Secondary | ICD-10-CM | POA: Diagnosis not present

## 2021-07-13 DIAGNOSIS — M79605 Pain in left leg: Secondary | ICD-10-CM | POA: Diagnosis not present

## 2021-07-17 DIAGNOSIS — L851 Acquired keratosis [keratoderma] palmaris et plantaris: Secondary | ICD-10-CM | POA: Diagnosis not present

## 2021-07-17 DIAGNOSIS — E1140 Type 2 diabetes mellitus with diabetic neuropathy, unspecified: Secondary | ICD-10-CM | POA: Diagnosis not present

## 2021-07-17 DIAGNOSIS — B351 Tinea unguium: Secondary | ICD-10-CM | POA: Diagnosis not present

## 2021-08-25 DIAGNOSIS — I25118 Atherosclerotic heart disease of native coronary artery with other forms of angina pectoris: Secondary | ICD-10-CM | POA: Diagnosis not present

## 2021-08-25 DIAGNOSIS — E119 Type 2 diabetes mellitus without complications: Secondary | ICD-10-CM | POA: Diagnosis not present

## 2021-08-25 DIAGNOSIS — M79605 Pain in left leg: Secondary | ICD-10-CM | POA: Diagnosis not present

## 2021-08-25 DIAGNOSIS — Z955 Presence of coronary angioplasty implant and graft: Secondary | ICD-10-CM | POA: Diagnosis not present

## 2021-08-25 DIAGNOSIS — E782 Mixed hyperlipidemia: Secondary | ICD-10-CM | POA: Diagnosis not present

## 2021-08-25 DIAGNOSIS — I10 Essential (primary) hypertension: Secondary | ICD-10-CM | POA: Diagnosis not present

## 2021-08-25 DIAGNOSIS — R001 Bradycardia, unspecified: Secondary | ICD-10-CM | POA: Diagnosis not present

## 2021-09-03 DIAGNOSIS — R531 Weakness: Secondary | ICD-10-CM | POA: Diagnosis not present

## 2021-09-03 DIAGNOSIS — R202 Paresthesia of skin: Secondary | ICD-10-CM | POA: Diagnosis not present

## 2021-09-03 DIAGNOSIS — F330 Major depressive disorder, recurrent, mild: Secondary | ICD-10-CM | POA: Diagnosis not present

## 2021-09-03 DIAGNOSIS — E7800 Pure hypercholesterolemia, unspecified: Secondary | ICD-10-CM | POA: Diagnosis not present

## 2021-09-03 DIAGNOSIS — I10 Essential (primary) hypertension: Secondary | ICD-10-CM | POA: Diagnosis not present

## 2021-09-03 DIAGNOSIS — M79604 Pain in right leg: Secondary | ICD-10-CM | POA: Diagnosis not present

## 2021-09-03 DIAGNOSIS — E1165 Type 2 diabetes mellitus with hyperglycemia: Secondary | ICD-10-CM | POA: Diagnosis not present

## 2021-09-03 DIAGNOSIS — R262 Difficulty in walking, not elsewhere classified: Secondary | ICD-10-CM | POA: Diagnosis not present

## 2021-09-03 DIAGNOSIS — E039 Hypothyroidism, unspecified: Secondary | ICD-10-CM | POA: Diagnosis not present

## 2021-09-09 ENCOUNTER — Ambulatory Visit: Admission: RE | Admit: 2021-09-09 | Payer: Medicare HMO | Source: Ambulatory Visit

## 2021-09-09 ENCOUNTER — Other Ambulatory Visit: Payer: Self-pay | Admitting: Internal Medicine

## 2021-09-09 DIAGNOSIS — G8929 Other chronic pain: Secondary | ICD-10-CM | POA: Diagnosis not present

## 2021-09-09 DIAGNOSIS — R262 Difficulty in walking, not elsewhere classified: Secondary | ICD-10-CM | POA: Diagnosis not present

## 2021-09-09 DIAGNOSIS — I110 Hypertensive heart disease with heart failure: Secondary | ICD-10-CM | POA: Diagnosis not present

## 2021-09-09 DIAGNOSIS — E538 Deficiency of other specified B group vitamins: Secondary | ICD-10-CM | POA: Diagnosis not present

## 2021-09-09 DIAGNOSIS — E039 Hypothyroidism, unspecified: Secondary | ICD-10-CM | POA: Diagnosis not present

## 2021-09-09 DIAGNOSIS — M25561 Pain in right knee: Secondary | ICD-10-CM | POA: Diagnosis not present

## 2021-09-09 DIAGNOSIS — R600 Localized edema: Secondary | ICD-10-CM

## 2021-09-09 DIAGNOSIS — M25572 Pain in left ankle and joints of left foot: Secondary | ICD-10-CM | POA: Diagnosis not present

## 2021-09-09 DIAGNOSIS — I5030 Unspecified diastolic (congestive) heart failure: Secondary | ICD-10-CM | POA: Diagnosis not present

## 2021-09-14 DIAGNOSIS — M79605 Pain in left leg: Secondary | ICD-10-CM | POA: Diagnosis not present

## 2021-09-15 ENCOUNTER — Ambulatory Visit
Admission: RE | Admit: 2021-09-15 | Discharge: 2021-09-15 | Disposition: A | Discharge status: Home / Self Care | Payer: Medicare HMO | Source: Ambulatory Visit | Attending: Internal Medicine | Admitting: Internal Medicine

## 2021-09-15 DIAGNOSIS — R600 Localized edema: Secondary | ICD-10-CM | POA: Insufficient documentation

## 2021-09-15 DIAGNOSIS — M7121 Synovial cyst of popliteal space [Baker], right knee: Secondary | ICD-10-CM | POA: Diagnosis not present

## 2021-09-21 DIAGNOSIS — R001 Bradycardia, unspecified: Secondary | ICD-10-CM | POA: Diagnosis not present

## 2021-09-21 DIAGNOSIS — R011 Cardiac murmur, unspecified: Secondary | ICD-10-CM | POA: Diagnosis not present

## 2021-09-21 DIAGNOSIS — E782 Mixed hyperlipidemia: Secondary | ICD-10-CM | POA: Diagnosis not present

## 2021-09-21 DIAGNOSIS — I208 Other forms of angina pectoris: Secondary | ICD-10-CM | POA: Diagnosis not present

## 2021-09-21 DIAGNOSIS — E119 Type 2 diabetes mellitus without complications: Secondary | ICD-10-CM | POA: Diagnosis not present

## 2021-09-21 DIAGNOSIS — I25118 Atherosclerotic heart disease of native coronary artery with other forms of angina pectoris: Secondary | ICD-10-CM | POA: Diagnosis not present

## 2021-09-21 DIAGNOSIS — I10 Essential (primary) hypertension: Secondary | ICD-10-CM | POA: Diagnosis not present

## 2021-09-21 DIAGNOSIS — R079 Chest pain, unspecified: Secondary | ICD-10-CM | POA: Diagnosis not present

## 2021-09-21 DIAGNOSIS — Z955 Presence of coronary angioplasty implant and graft: Secondary | ICD-10-CM | POA: Diagnosis not present

## 2021-09-28 ENCOUNTER — Emergency Department: Payer: Medicare HMO

## 2021-09-28 ENCOUNTER — Emergency Department
Admission: EM | Admit: 2021-09-28 | Discharge: 2021-09-29 | Disposition: A | Discharge status: Home / Self Care | Payer: Medicare HMO | Attending: Emergency Medicine | Admitting: Emergency Medicine

## 2021-09-28 DIAGNOSIS — M533 Sacrococcygeal disorders, not elsewhere classified: Secondary | ICD-10-CM | POA: Insufficient documentation

## 2021-09-28 DIAGNOSIS — I5032 Chronic diastolic (congestive) heart failure: Secondary | ICD-10-CM | POA: Insufficient documentation

## 2021-09-28 DIAGNOSIS — R600 Localized edema: Secondary | ICD-10-CM | POA: Insufficient documentation

## 2021-09-28 DIAGNOSIS — R110 Nausea: Secondary | ICD-10-CM | POA: Diagnosis not present

## 2021-09-28 DIAGNOSIS — E039 Hypothyroidism, unspecified: Secondary | ICD-10-CM | POA: Diagnosis not present

## 2021-09-28 DIAGNOSIS — I110 Hypertensive heart disease with heart failure: Secondary | ICD-10-CM | POA: Diagnosis not present

## 2021-09-28 DIAGNOSIS — I2510 Atherosclerotic heart disease of native coronary artery without angina pectoris: Secondary | ICD-10-CM | POA: Diagnosis not present

## 2021-09-28 DIAGNOSIS — R1032 Left lower quadrant pain: Secondary | ICD-10-CM | POA: Insufficient documentation

## 2021-09-28 DIAGNOSIS — E119 Type 2 diabetes mellitus without complications: Secondary | ICD-10-CM | POA: Diagnosis not present

## 2021-09-28 DIAGNOSIS — R001 Bradycardia, unspecified: Secondary | ICD-10-CM | POA: Diagnosis not present

## 2021-09-28 DIAGNOSIS — E871 Hypo-osmolality and hyponatremia: Secondary | ICD-10-CM | POA: Diagnosis not present

## 2021-09-28 LAB — CBC
HCT: 38.5 % (ref 36.0–46.0)
Hemoglobin: 13.2 g/dL (ref 12.0–15.0)
MCH: 29.7 pg (ref 26.0–34.0)
MCHC: 34.3 g/dL (ref 30.0–36.0)
MCV: 86.5 fL (ref 80.0–100.0)
Platelets: 479 10*3/uL — ABNORMAL HIGH (ref 150–400)
RBC: 4.45 MIL/uL (ref 3.87–5.11)
RDW: 12.1 % (ref 11.5–15.5)
WBC: 5.8 10*3/uL (ref 4.0–10.5)
nRBC: 0 % (ref 0.0–0.2)

## 2021-09-28 LAB — URINALYSIS, ROUTINE W REFLEX MICROSCOPIC
Bilirubin Urine: NEGATIVE
Glucose, UA: 50 mg/dL — AB
Hgb urine dipstick: NEGATIVE
Ketones, ur: NEGATIVE mg/dL
Leukocytes,Ua: NEGATIVE
Nitrite: NEGATIVE
Protein, ur: NEGATIVE mg/dL
Specific Gravity, Urine: 1.003 — ABNORMAL LOW (ref 1.005–1.030)
pH: 8 (ref 5.0–8.0)

## 2021-09-28 LAB — LIPASE, BLOOD: Lipase: 32 U/L (ref 11–51)

## 2021-09-28 LAB — COMPREHENSIVE METABOLIC PANEL
ALT: 16 U/L (ref 0–44)
AST: 13 U/L — ABNORMAL LOW (ref 15–41)
Albumin: 3.8 g/dL (ref 3.5–5.0)
Alkaline Phosphatase: 101 U/L (ref 38–126)
Anion gap: 9 (ref 5–15)
BUN: 7 mg/dL — ABNORMAL LOW (ref 8–23)
CO2: 29 mmol/L (ref 22–32)
Calcium: 9.1 mg/dL (ref 8.9–10.3)
Chloride: 89 mmol/L — ABNORMAL LOW (ref 98–111)
Creatinine, Ser: 0.56 mg/dL (ref 0.44–1.00)
GFR, Estimated: >60 mL/min (ref >60)
Glucose, Bld: 222 mg/dL — ABNORMAL HIGH (ref 70–99)
Potassium: 3.5 mmol/L (ref 3.5–5.1)
Sodium: 127 mmol/L — ABNORMAL LOW (ref 135–145)
Total Bilirubin: 0.9 mg/dL (ref 0.3–1.2)
Total Protein: 6.6 g/dL (ref 6.5–8.1)

## 2021-09-28 MED ORDER — ACETAMINOPHEN 500 MG PO TABS
1000.0000 mg | ORAL_TABLET | Freq: Once | ORAL | Status: AC
  Administered 2021-09-28: 1000 mg via ORAL
  Filled 2021-09-28: qty 2

## 2021-09-28 MED ORDER — IOHEXOL 300 MG/ML  SOLN
100.0000 mL | Freq: Once | INTRAMUSCULAR | Status: AC | PRN
  Administered 2021-09-28: 100 mL via INTRAVENOUS

## 2021-09-28 NOTE — ED Provider Notes (Signed)
? ?[facility] ?Provider Note ? ? ? Event Date/Time  ? First MD Initiated Contact with Patient 09/28/21 2141   ?  (approximate) ? ? ?History  ? ? ? ?HPI ? ?Christine Zimmerman is a 76 y.o. female with past medical history of CHF, coronary disease, A-fib, chronic pain, sciatica presents with left lower quadrant abdominal pain.  Says this is been going on since this morning.  Sharp radiates to the leg and to the low back.  Initially she thought it was sciatica.  She denies nausea vomiting fevers or chills denies diarrhea constipation no urinary symptoms.  Patient says pain felt worse when she was walking. ?  ? ?Past Medical History:  ?Diagnosis Date  ? Atrial fibrillation (HCC)   ? CHF (congestive heart failure) (HCC)   ? Chronic pain   ? Coronary artery disease   ? Diabetes mellitus without complication (HCC)   ? GERD (gastroesophageal reflux disease)   ? High cholesterol   ? Hypertension   ? Hypothyroidism   ? ? ?Patient Active Problem List  ? Diagnosis Date Noted  ? Weakness generalized 05/09/2019  ? Chronic diastolic heart failure (HCC) 03/23/2018  ? HTN (hypertension) 03/23/2018  ? DM (diabetes mellitus) (HCC) 03/23/2018  ? SOB (shortness of breath) 02/01/2018  ? Dizziness 08/01/2017  ? TIA (transient ischemic attack) 10/17/2015  ? Hyponatremia 10/17/2015  ? Anemia 10/17/2015  ? Chest pain 10/16/2015  ? Left-sided weakness 10/16/2015  ? Hypotension 10/16/2015  ? Bradycardia 10/16/2015  ? Hypoglycemia 10/16/2015  ? ? ? ?Physical Exam  ?Triage Vital Signs: ?ED Triage Vitals  ?Enc Vitals Group  ?   BP 09/28/21 1910 134/66  ?   Pulse Rate 09/28/21 1910 (!) 54  ?   Resp 09/28/21 1910 16  ?   Temp 09/28/21 1910 98.3 ?F (36.8 ?C)  ?   Temp Source 09/28/21 1910 Oral  ?   SpO2 09/28/21 1910 94 %  ?   Weight 09/28/21 1911 139 lb (63 kg)  ?   Height --   ?   Head Circumference --   ?   Peak Flow --   ?   Pain Score 09/28/21 1910 8  ?   Pain Loc --   ?   Pain Edu? --   ?   Excl. in GC? --   ? ? ?Most recent  vital signs: ?Vitals:  ? 09/28/21 2310 09/28/21 2330  ?BP: (!) 180/59   ?Pulse: 61 68  ?Resp: 20 15  ?Temp:    ?SpO2: 98% 100%  ? ? ? ?General: Awake, no distress.  ?CV:  Good peripheral perfusion.  ?Resp:  Normal effort.  ?Abd:  No distention.  Mild tenderness to palpation left lower quadrant ?Neuro:             Awake, Alert, Oriented x 3  ?Other:  Tenderness to palpation over the left SI joint, no midline lumbar tenderness, 5 out of 5 strength with plantarflexion dorsiflexion and hip flexion bilaterally ? ?1+ edema in bilateral lower extremities ? ? ?ED Results / Procedures / Treatments  ?Labs ?(all labs ordered are listed, but only abnormal results are displayed) ?Labs Reviewed  ?COMPREHENSIVE METABOLIC PANEL - Abnormal; Notable for the following components:  ?    Result Value  ? Sodium 127 (*)   ? Chloride 89 (*)   ? Glucose, Bld 222 (*)   ? BUN 7 (*)   ? AST 13 (*)   ? All other components within  normal limits  ?CBC - Abnormal; Notable for the following components:  ? Platelets 479 (*)   ? All other components within normal limits  ?URINALYSIS, ROUTINE W REFLEX MICROSCOPIC - Abnormal; Notable for the following components:  ? Color, Urine STRAW (*)   ? APPearance CLEAR (*)   ? Specific Gravity, Urine 1.003 (*)   ? Glucose, UA 50 (*)   ? All other components within normal limits  ?LIPASE, BLOOD  ? ? ? ?EKG ? ?EKG interpreted by myself shows sinus bradycardia with right bundle branch block, PACs no acute ischemic change ? ? ?RADIOLOGY ?I reviewed and interpreted the CT abdomen which is negative for acute pathology ? ? ?PROCEDURES: ? ?Critical Care performed: No ? ?Procedures ? ? ?MEDICATIONS ORDERED IN ED: ?Medications  ?iohexol (OMNIPAQUE) 300 MG/ML solution 100 mL (100 mLs Intravenous Contrast Given 09/28/21 2245)  ?acetaminophen (TYLENOL) tablet 1,000 mg (1,000 mg Oral Given 09/28/21 2337)  ? ? ? ?IMPRESSION / MDM / ASSESSMENT AND PLAN / ED COURSE  ?I reviewed the triage vital signs and the nursing notes. ?              ?               ? ?Differential diagnosis includes, but is not limited to, reticulitis, kidney stone, colitis, musculoskeletal, sciatica ? ?Patient is a 76 year old female presents with left lower quadrant abdominal pain.  Has been going on since this morning.  It also radiates down to the leg and low back.  She has a history of sciatica but typically does not involve the abdomen.  No bowel or bladder incontinence denies nausea vomiting diarrhea fevers chills or urinary symptoms.  She is hypertensive and bradycardic but otherwise her vitals are within normal limits she appears quite well on exam she does have some mild left lower quadrant abdominal tenderness but abdominal exam is benign.  She is also tender over the left SI joint without midline lumbar tenderness and has good strength in her extremities.  Labs are overall reassuring.  She is hyponatremic with a sodium of 127, I see that she has been hyponatremic in the past.  She has peripheral edema mild and I suspect that this may be related to underlying heart failure therefore will not give fluids.  I recommended she relatively fluid restrict and follow-up with PCP or cardiology in about 5 to 7 days for repeat.  She does not seem to be symptomatic from this.  CT of her abdomen was obtained to rule out acute abdominal process such as diverticulitis and this is negative.  Overall I suspect that her symptoms could be related to musculoskeletal process.  UA is without hematuria or infection.  Patient did not want any IV opioids so she was given Tylenol for pain.  We discussed return precautions. ? ?  ? ? ?FINAL CLINICAL IMPRESSION(S) / ED DIAGNOSES  ? ?Final diagnoses:  ?Hyponatremia  ?LLQ abdominal pain  ? ? ? ?Rx / DC Orders  ? ?ED Discharge Orders   ? ? None  ? ?  ? ? ? ?Note:  This document was prepared using Dragon voice recognition software and may include unintentional dictation errors. ?  ?Georga Hacking, MD ?09/29/21 0011 ? ?

## 2021-09-28 NOTE — Discharge Instructions (Addendum)
Your CAT scan of your abdomen was normal.  Your pain may be related to underlying sciatica for which you can take Tylenol.  Your sodium was low at 127.  This may be related to your heart failure.  Please continue to take your diuretic and follow-up with your primary care doctor or cardiologist in 5 to 7 days for repeat blood check. ? ?You can take 1 g of Tylenol every 8 hours for pain. ?

## 2021-09-28 NOTE — ED Triage Notes (Signed)
Pt presents via POV c/o LLQ abd pain since this am. Reports some nausea yesterday. LLQ TTP. Reports pain worse with movement.  ?

## 2021-09-28 NOTE — ED Notes (Signed)
Patient husband called to pick husband up from hospital eta 30 minutes ?

## 2021-09-30 DIAGNOSIS — R001 Bradycardia, unspecified: Secondary | ICD-10-CM | POA: Diagnosis not present

## 2021-10-01 DIAGNOSIS — E039 Hypothyroidism, unspecified: Secondary | ICD-10-CM | POA: Diagnosis not present

## 2021-10-01 DIAGNOSIS — M5136 Other intervertebral disc degeneration, lumbar region: Secondary | ICD-10-CM | POA: Diagnosis not present

## 2021-10-01 DIAGNOSIS — M47816 Spondylosis without myelopathy or radiculopathy, lumbar region: Secondary | ICD-10-CM | POA: Diagnosis not present

## 2021-10-01 DIAGNOSIS — R109 Unspecified abdominal pain: Secondary | ICD-10-CM | POA: Diagnosis not present

## 2021-10-01 DIAGNOSIS — Z780 Asymptomatic menopausal state: Secondary | ICD-10-CM | POA: Diagnosis not present

## 2021-10-01 DIAGNOSIS — S82142A Displaced bicondylar fracture of left tibia, initial encounter for closed fracture: Secondary | ICD-10-CM | POA: Diagnosis not present

## 2021-10-01 DIAGNOSIS — R262 Difficulty in walking, not elsewhere classified: Secondary | ICD-10-CM | POA: Diagnosis not present

## 2021-10-01 DIAGNOSIS — M1711 Unilateral primary osteoarthritis, right knee: Secondary | ICD-10-CM | POA: Diagnosis not present

## 2021-10-01 DIAGNOSIS — M1712 Unilateral primary osteoarthritis, left knee: Secondary | ICD-10-CM | POA: Diagnosis not present

## 2021-10-01 DIAGNOSIS — E871 Hypo-osmolality and hyponatremia: Secondary | ICD-10-CM | POA: Diagnosis not present

## 2021-10-01 DIAGNOSIS — E1140 Type 2 diabetes mellitus with diabetic neuropathy, unspecified: Secondary | ICD-10-CM | POA: Diagnosis not present

## 2021-10-01 DIAGNOSIS — E1165 Type 2 diabetes mellitus with hyperglycemia: Secondary | ICD-10-CM | POA: Diagnosis not present

## 2021-10-01 DIAGNOSIS — R5381 Other malaise: Secondary | ICD-10-CM | POA: Diagnosis not present

## 2021-10-01 DIAGNOSIS — R5383 Other fatigue: Secondary | ICD-10-CM | POA: Diagnosis not present

## 2021-10-01 DIAGNOSIS — M170 Bilateral primary osteoarthritis of knee: Secondary | ICD-10-CM | POA: Diagnosis not present

## 2021-10-01 DIAGNOSIS — M5432 Sciatica, left side: Secondary | ICD-10-CM | POA: Diagnosis not present

## 2021-10-01 DIAGNOSIS — Z1231 Encounter for screening mammogram for malignant neoplasm of breast: Secondary | ICD-10-CM | POA: Diagnosis not present

## 2021-10-01 DIAGNOSIS — M545 Low back pain, unspecified: Secondary | ICD-10-CM | POA: Diagnosis not present

## 2021-10-02 DIAGNOSIS — M5136 Other intervertebral disc degeneration, lumbar region: Secondary | ICD-10-CM | POA: Diagnosis not present

## 2021-10-02 DIAGNOSIS — R109 Unspecified abdominal pain: Secondary | ICD-10-CM | POA: Diagnosis not present

## 2021-10-02 DIAGNOSIS — M8588 Other specified disorders of bone density and structure, other site: Secondary | ICD-10-CM | POA: Diagnosis not present

## 2021-10-02 DIAGNOSIS — G5603 Carpal tunnel syndrome, bilateral upper limbs: Secondary | ICD-10-CM | POA: Diagnosis not present

## 2021-10-02 DIAGNOSIS — R252 Cramp and spasm: Secondary | ICD-10-CM | POA: Diagnosis not present

## 2021-10-02 DIAGNOSIS — R260 Ataxic gait: Secondary | ICD-10-CM | POA: Diagnosis not present

## 2021-10-07 DIAGNOSIS — Z955 Presence of coronary angioplasty implant and graft: Secondary | ICD-10-CM | POA: Diagnosis not present

## 2021-10-07 DIAGNOSIS — I495 Sick sinus syndrome: Secondary | ICD-10-CM | POA: Diagnosis not present

## 2021-10-07 DIAGNOSIS — I25118 Atherosclerotic heart disease of native coronary artery with other forms of angina pectoris: Secondary | ICD-10-CM | POA: Diagnosis not present

## 2021-10-07 DIAGNOSIS — Z01810 Encounter for preprocedural cardiovascular examination: Secondary | ICD-10-CM | POA: Diagnosis not present

## 2021-10-07 DIAGNOSIS — Z01818 Encounter for other preprocedural examination: Secondary | ICD-10-CM | POA: Diagnosis not present

## 2021-10-07 DIAGNOSIS — Z7901 Long term (current) use of anticoagulants: Secondary | ICD-10-CM | POA: Diagnosis not present

## 2021-10-07 DIAGNOSIS — Z5181 Encounter for therapeutic drug level monitoring: Secondary | ICD-10-CM | POA: Diagnosis not present

## 2021-10-07 DIAGNOSIS — R079 Chest pain, unspecified: Secondary | ICD-10-CM | POA: Diagnosis not present

## 2021-10-07 DIAGNOSIS — I10 Essential (primary) hypertension: Secondary | ICD-10-CM | POA: Diagnosis not present

## 2021-10-13 DIAGNOSIS — R001 Bradycardia, unspecified: Secondary | ICD-10-CM | POA: Diagnosis not present

## 2021-10-14 ENCOUNTER — Other Ambulatory Visit: Payer: Self-pay

## 2021-10-14 ENCOUNTER — Emergency Department
Admission: EM | Admit: 2021-10-14 | Discharge: 2021-10-14 | Disposition: A | Discharge status: Home / Self Care | Payer: Medicare HMO | Attending: Emergency Medicine | Admitting: Emergency Medicine

## 2021-10-14 DIAGNOSIS — E119 Type 2 diabetes mellitus without complications: Secondary | ICD-10-CM | POA: Insufficient documentation

## 2021-10-14 DIAGNOSIS — I10 Essential (primary) hypertension: Secondary | ICD-10-CM | POA: Insufficient documentation

## 2021-10-14 DIAGNOSIS — Z743 Need for continuous supervision: Secondary | ICD-10-CM | POA: Diagnosis not present

## 2021-10-14 DIAGNOSIS — R001 Bradycardia, unspecified: Secondary | ICD-10-CM

## 2021-10-14 DIAGNOSIS — Z950 Presence of cardiac pacemaker: Secondary | ICD-10-CM | POA: Diagnosis not present

## 2021-10-14 DIAGNOSIS — I2510 Atherosclerotic heart disease of native coronary artery without angina pectoris: Secondary | ICD-10-CM | POA: Insufficient documentation

## 2021-10-14 DIAGNOSIS — R6889 Other general symptoms and signs: Secondary | ICD-10-CM | POA: Diagnosis not present

## 2021-10-14 DIAGNOSIS — R531 Weakness: Secondary | ICD-10-CM | POA: Diagnosis not present

## 2021-10-14 DIAGNOSIS — I499 Cardiac arrhythmia, unspecified: Secondary | ICD-10-CM | POA: Diagnosis not present

## 2021-10-14 DIAGNOSIS — E871 Hypo-osmolality and hyponatremia: Secondary | ICD-10-CM | POA: Diagnosis not present

## 2021-10-14 LAB — BASIC METABOLIC PANEL
Anion gap: 6 (ref 5–15)
BUN: 7 mg/dL — ABNORMAL LOW (ref 8–23)
CO2: 25 mmol/L (ref 22–32)
Calcium: 8.6 mg/dL — ABNORMAL LOW (ref 8.9–10.3)
Chloride: 90 mmol/L — ABNORMAL LOW (ref 98–111)
Creatinine, Ser: 0.49 mg/dL (ref 0.44–1.00)
GFR, Estimated: >60 mL/min (ref >60)
Glucose, Bld: 183 mg/dL — ABNORMAL HIGH (ref 70–99)
Potassium: 4.2 mmol/L (ref 3.5–5.1)
Sodium: 121 mmol/L — ABNORMAL LOW (ref 135–145)

## 2021-10-14 LAB — URINALYSIS, ROUTINE W REFLEX MICROSCOPIC
Bacteria, UA: NONE SEEN
Bilirubin Urine: NEGATIVE
Glucose, UA: NEGATIVE mg/dL
Hgb urine dipstick: NEGATIVE
Ketones, ur: NEGATIVE mg/dL
Nitrite: NEGATIVE
Protein, ur: NEGATIVE mg/dL
Specific Gravity, Urine: 1.003 — ABNORMAL LOW (ref 1.005–1.030)
pH: 8 (ref 5.0–8.0)

## 2021-10-14 LAB — CBC
HCT: 36.6 % (ref 36.0–46.0)
Hemoglobin: 12.4 g/dL (ref 12.0–15.0)
MCH: 29.5 pg (ref 26.0–34.0)
MCHC: 33.9 g/dL (ref 30.0–36.0)
MCV: 87.1 fL (ref 80.0–100.0)
Platelets: 425 10*3/uL — ABNORMAL HIGH (ref 150–400)
RBC: 4.2 MIL/uL (ref 3.87–5.11)
RDW: 12.8 % (ref 11.5–15.5)
WBC: 4.8 10*3/uL (ref 4.0–10.5)
nRBC: 0 % (ref 0.0–0.2)

## 2021-10-14 LAB — BRAIN NATRIURETIC PEPTIDE: B Natriuretic Peptide: 232 pg/mL — ABNORMAL HIGH (ref 0.0–100.0)

## 2021-10-14 LAB — TROPONIN I (HIGH SENSITIVITY): Troponin I (High Sensitivity): 5 ng/L (ref <18)

## 2021-10-14 MED ORDER — SODIUM CHLORIDE 0.9 % IV BOLUS
500.0000 mL | Freq: Once | INTRAVENOUS | Status: AC
  Administered 2021-10-14: 500 mL via INTRAVENOUS

## 2021-10-14 NOTE — Discharge Instructions (Addendum)
Continue taking all of your normal medications as prescribed.  Follow-up with your cardiologist as scheduled.  You are scheduled for a pacemaker placement in June.  You will be called with the date.  In the meantime, return to the ER for new, worsening, or persistent severe weakness or dizziness, recurrent episodes of heart rate in the 30s if you are feeling weak or dizzy, low blood pressure, chest pain, difficulty breathing, or any other new or worsening symptoms that concern you.

## 2021-10-14 NOTE — ED Provider Notes (Signed)
[facility] Provider Note    Event Date/Time   First MD Initiated Contact with Patient 10/14/21 1142     (approximate)   History   Bradycardia   HPI  Christine Zimmerman is a 76 y.o. female with history of CAD, sick sinus syndrome with symptomatic bradycardia, RBBB, hypertension, hyperlipidemia, diabetes who presents with an episode of a low heart rate today.  The patient states that she was feeling somewhat weak this morning.  She checked her blood sugar after eating and it was 98.  She then noted that she had a heart rate in the 30s.  Her heart rate normally runs in the 40s and 50s.  She is currently scheduled for a pacemaker placement next month.  The patient states that she is feeling somewhat better now.  She denies any chest pain, difficulty breathing, fever, weakness, or other acute symptoms.  Her blood pressure was elevated to the 170s at home but 123/47 with EMS.    Physical Exam   Triage Vital Signs: ED Triage Vitals [10/14/21 1119]  Enc Vitals Group     BP (!) 175/56     Pulse Rate (!) 44     Resp 11     Temp (!) 97.5 F (36.4 C)     Temp Source Oral     SpO2 99 %     Weight 139 lb (63 kg)     Height 5' (1.524 m)     Head Circumference      Peak Flow      Pain Score 0     Pain Loc      Pain Edu?      Excl. in GC?     Most recent vital signs: Vitals:   10/14/21 1430 10/14/21 1523  BP: (!) 142/56   Pulse: (!) 58   Resp: 13   Temp:  98 F (36.7 C)  SpO2: 99%      General: Alert and oriented, relatively well-appearing. CV:  Good peripheral perfusion.  Bradycardic, otherwise normal heart sounds. Resp:  Normal effort.  Lungs CTAB. Abd:  No distention.  Other:  Motor intact in all extremities.  Moist mucous membranes.   ED Results / Procedures / Treatments   Labs (all labs ordered are listed, but only abnormal results are displayed) Labs Reviewed  BASIC METABOLIC PANEL - Abnormal; Notable for the following components:       Result Value   Sodium 121 (*)    Chloride 90 (*)    Glucose, Bld 183 (*)    BUN 7 (*)    Calcium 8.6 (*)    All other components within normal limits  CBC - Abnormal; Notable for the following components:   Platelets 425 (*)    All other components within normal limits  URINALYSIS, ROUTINE W REFLEX MICROSCOPIC - Abnormal; Notable for the following components:   Color, Urine STRAW (*)    APPearance CLEAR (*)    Specific Gravity, Urine 1.003 (*)    Leukocytes,Ua SMALL (*)    All other components within normal limits  BRAIN NATRIURETIC PEPTIDE  CBG MONITORING, ED  TROPONIN I (HIGH SENSITIVITY)  TROPONIN I (HIGH SENSITIVITY)     EKG  ED ECG REPORT I, Dionne Bucy, the attending physician, personally viewed and interpreted this ECG.  Date: 10/14/2021 EKG Time: 1318 Rate: 59 Rhythm: Sinus bradycardia QRS Axis: normal Intervals: RBBB ST/T Wave abnormalities: normal Narrative Interpretation: Sinus bradycardia with no evidence of acute ischemia  RADIOLOGY   PROCEDURES:  Critical Care performed: No  Procedures   MEDICATIONS ORDERED IN ED: Medications  sodium chloride 0.9 % bolus 500 mL (0 mLs Intravenous Stopped 10/14/21 1320)     IMPRESSION / MDM / ASSESSMENT AND PLAN / ED COURSE  I reviewed the triage vital signs and the nursing notes.  76 year old female with PMH as noted above presents with an episode of bradycardia with some generalized weakness this morning.  She noted her heart rate at home to be 37.  I reviewed the past medical records.  The patient follows with Dr. Gretel Acre and has been followed for sick sinus syndrome and symptomatic bradycardia.  Her heart rate usually runs in the 40s.  She is scheduled for a pacemaker placement later in June.  On exam currently her vital signs are normal except for a heart rate in the 50s.  Physical exam is otherwise unremarkable.  EKG shows a sinus rhythm in the 50s with no acute findings.  Differential  diagnosis includes, but is not limited to, sick sinus syndrome, other primary cardiac etiology of bradycardia.  There is no evidence of ACS.  The patient has no chest pain or anginal equivalents.  Patient's presentation is most consistent with severe exacerbation of chronic illness.  We will obtain basic labs, troponin, discussed with cardiology and reassess.  The patient is on the cardiac monitor to evaluate for evidence of arrhythmia and/or significant heart rate changes.  ----------------------------------------- 4:13 PM on 10/14/2021 -----------------------------------------  The patient's heart rate has remained in the high 40s and 50s, which is her baseline, throughout her ED stay.  Lab work-up was significant for hyponatremia although from prior labs the patient is chronically hyponatremic around 1 22-1 24 and the sodium is only mildly down today.  I ordered a 500 mL fluid bolus.  Troponin is negative.  There is no indication for a repeat.  On reassessment, the patient has remained asymptomatic and her blood pressure is normal.  I consulted Dr. Darrold Junker from cardiology.  He is actually scheduled to do the patient's pacemaker placement in June.  He advises that there is no indication for further cardiac monitoring or admission and that she should follow-up as scheduled.  On reassessment the patient stated that she was feeling well and was eager to go home.  I counseled her on the results of the work-up and the cardiology recommendations.  Return precautions given, and she expresses understanding.    FINAL CLINICAL IMPRESSION(S) / ED DIAGNOSES   Final diagnoses:  Symptomatic bradycardia  Hyponatremia     Rx / DC Orders   ED Discharge Orders     None        Note:  This document was prepared using Dragon voice recognition software and may include unintentional dictation errors.    Dionne Bucy, MD 10/14/21 [phone]

## 2021-10-14 NOTE — ED Triage Notes (Signed)
Patient to ER via [facility] with complaints of bradycardia, HR between 41-53. Sinus bradycardia on EMS EKG. Patient initially called out due to hypotension, BP 123/47 with EMS.  Patient was supposed to have appointment for pacemaker today but cancelled.

## 2021-10-16 DIAGNOSIS — E871 Hypo-osmolality and hyponatremia: Secondary | ICD-10-CM | POA: Diagnosis not present

## 2021-10-16 DIAGNOSIS — R001 Bradycardia, unspecified: Secondary | ICD-10-CM | POA: Diagnosis not present

## 2021-10-16 DIAGNOSIS — I4510 Unspecified right bundle-branch block: Secondary | ICD-10-CM | POA: Diagnosis not present

## 2021-10-16 DIAGNOSIS — M25561 Pain in right knee: Secondary | ICD-10-CM | POA: Diagnosis not present

## 2021-10-16 DIAGNOSIS — R262 Difficulty in walking, not elsewhere classified: Secondary | ICD-10-CM | POA: Diagnosis not present

## 2021-10-16 DIAGNOSIS — E1140 Type 2 diabetes mellitus with diabetic neuropathy, unspecified: Secondary | ICD-10-CM | POA: Diagnosis not present

## 2021-10-16 DIAGNOSIS — I110 Hypertensive heart disease with heart failure: Secondary | ICD-10-CM | POA: Diagnosis not present

## 2021-10-16 DIAGNOSIS — M79605 Pain in left leg: Secondary | ICD-10-CM | POA: Diagnosis not present

## 2021-10-16 DIAGNOSIS — E039 Hypothyroidism, unspecified: Secondary | ICD-10-CM | POA: Diagnosis not present

## 2021-10-29 DIAGNOSIS — E039 Hypothyroidism, unspecified: Secondary | ICD-10-CM | POA: Diagnosis not present

## 2021-10-29 DIAGNOSIS — E1140 Type 2 diabetes mellitus with diabetic neuropathy, unspecified: Secondary | ICD-10-CM | POA: Diagnosis not present

## 2021-10-29 DIAGNOSIS — K219 Gastro-esophageal reflux disease without esophagitis: Secondary | ICD-10-CM | POA: Diagnosis not present

## 2021-10-29 DIAGNOSIS — I5030 Unspecified diastolic (congestive) heart failure: Secondary | ICD-10-CM | POA: Diagnosis not present

## 2021-10-29 DIAGNOSIS — I2510 Atherosclerotic heart disease of native coronary artery without angina pectoris: Secondary | ICD-10-CM | POA: Diagnosis not present

## 2021-10-29 DIAGNOSIS — E1165 Type 2 diabetes mellitus with hyperglycemia: Secondary | ICD-10-CM | POA: Diagnosis not present

## 2021-10-29 DIAGNOSIS — I10 Essential (primary) hypertension: Secondary | ICD-10-CM | POA: Diagnosis not present

## 2021-11-03 ENCOUNTER — Other Ambulatory Visit: Payer: Self-pay

## 2021-11-03 ENCOUNTER — Encounter: Payer: Self-pay | Admitting: Cardiology

## 2021-11-03 ENCOUNTER — Encounter
Admission: RE | Disposition: A | Discharge status: Home Health | Payer: Self-pay | Source: Home / Self Care | Attending: Cardiology

## 2021-11-03 ENCOUNTER — Observation Stay: Payer: Medicare HMO

## 2021-11-03 ENCOUNTER — Observation Stay
Admission: RE | Admit: 2021-11-03 | Discharge: 2021-11-04 | Disposition: A | Discharge status: Home Health | Payer: Medicare HMO | Attending: Cardiology | Admitting: Cardiology

## 2021-11-03 DIAGNOSIS — J939 Pneumothorax, unspecified: Secondary | ICD-10-CM | POA: Diagnosis not present

## 2021-11-03 DIAGNOSIS — R296 Repeated falls: Secondary | ICD-10-CM | POA: Insufficient documentation

## 2021-11-03 DIAGNOSIS — M6281 Muscle weakness (generalized): Secondary | ICD-10-CM | POA: Diagnosis not present

## 2021-11-03 DIAGNOSIS — R2681 Unsteadiness on feet: Secondary | ICD-10-CM | POA: Insufficient documentation

## 2021-11-03 DIAGNOSIS — E119 Type 2 diabetes mellitus without complications: Secondary | ICD-10-CM | POA: Insufficient documentation

## 2021-11-03 DIAGNOSIS — Z7984 Long term (current) use of oral hypoglycemic drugs: Secondary | ICD-10-CM | POA: Insufficient documentation

## 2021-11-03 DIAGNOSIS — Z01818 Encounter for other preprocedural examination: Secondary | ICD-10-CM

## 2021-11-03 DIAGNOSIS — I495 Sick sinus syndrome: Secondary | ICD-10-CM | POA: Diagnosis not present

## 2021-11-03 DIAGNOSIS — R001 Bradycardia, unspecified: Principal | ICD-10-CM | POA: Insufficient documentation

## 2021-11-03 DIAGNOSIS — Z950 Presence of cardiac pacemaker: Secondary | ICD-10-CM | POA: Diagnosis not present

## 2021-11-03 HISTORY — PX: PACEMAKER IMPLANT: EP1218

## 2021-11-03 LAB — GLUCOSE, CAPILLARY
Glucose-Capillary: 113 mg/dL — ABNORMAL HIGH (ref 70–99)
Glucose-Capillary: 244 mg/dL — ABNORMAL HIGH (ref 70–99)

## 2021-11-03 SURGERY — PACEMAKER IMPLANT
Anesthesia: Moderate Sedation

## 2021-11-03 MED ORDER — FENTANYL CITRATE (PF) 100 MCG/2ML IJ SOLN
INTRAMUSCULAR | Status: DC | PRN
  Administered 2021-11-03: 50 ug via INTRAVENOUS
  Administered 2021-11-03 (×2): 25 ug via INTRAVENOUS

## 2021-11-03 MED ORDER — TIZANIDINE HCL 4 MG PO TABS
4.0000 mg | ORAL_TABLET | Freq: Every day | ORAL | Status: DC
  Administered 2021-11-03: 4 mg via ORAL
  Filled 2021-11-03 (×2): qty 1

## 2021-11-03 MED ORDER — MAGNESIUM OXIDE -MG SUPPLEMENT 400 (240 MG) MG PO TABS
400.0000 mg | ORAL_TABLET | Freq: Every day | ORAL | Status: DC
  Administered 2021-11-03 – 2021-11-04 (×2): 400 mg via ORAL
  Filled 2021-11-03 (×2): qty 1

## 2021-11-03 MED ORDER — METOPROLOL TARTRATE 5 MG/5ML IV SOLN
5.0000 mg | Freq: Once | INTRAVENOUS | Status: AC
  Administered 2021-11-03: 5 mg via INTRAVENOUS
  Filled 2021-11-03: qty 5

## 2021-11-03 MED ORDER — GABAPENTIN 400 MG PO CAPS: 800.0000 mg | ORAL_CAPSULE | Freq: Three times a day (TID) | ORAL | Status: DC

## 2021-11-03 MED ORDER — LIDOCAINE HCL 1 % IJ SOLN
INTRAMUSCULAR | Status: AC
  Filled 2021-11-03: qty 40

## 2021-11-03 MED ORDER — ACETAMINOPHEN 325 MG PO TABS
325.0000 mg | ORAL_TABLET | ORAL | Status: DC | PRN
  Administered 2021-11-03 – 2021-11-04 (×3): 650 mg via ORAL
  Filled 2021-11-03 (×3): qty 2

## 2021-11-03 MED ORDER — METFORMIN HCL 500 MG PO TABS
1000.0000 mg | ORAL_TABLET | Freq: Every day | ORAL | Status: DC
  Administered 2021-11-04: 1000 mg via ORAL
  Filled 2021-11-03: qty 2

## 2021-11-03 MED ORDER — ISOSORBIDE MONONITRATE ER 60 MG PO TB24
30.0000 mg | ORAL_TABLET | Freq: Every day | ORAL | Status: DC
  Administered 2021-11-03 – 2021-11-04 (×2): 30 mg via ORAL
  Filled 2021-11-03 (×2): qty 1

## 2021-11-03 MED ORDER — ISOSORBIDE MONONITRATE ER 30 MG PO TB24
30.0000 mg | ORAL_TABLET | Freq: Every day | ORAL | Status: DC
  Filled 2021-11-03: qty 1

## 2021-11-03 MED ORDER — NIACIN ER 500 MG PO TBCR
500.0000 mg | EXTENDED_RELEASE_TABLET | Freq: Every day | ORAL | Status: DC
  Administered 2021-11-03: 500 mg via ORAL
  Filled 2021-11-03 (×2): qty 1

## 2021-11-03 MED ORDER — LIDOCAINE HCL (PF) 1 % IJ SOLN
INTRAMUSCULAR | Status: DC | PRN
  Administered 2021-11-03: 30 mL

## 2021-11-03 MED ORDER — ONDANSETRON HCL 4 MG/2ML IJ SOLN: 4.0000 mg | Freq: Four times a day (QID) | INTRAMUSCULAR | Status: DC | PRN

## 2021-11-03 MED ORDER — SODIUM CHLORIDE 0.9 % IV SOLN
80.0000 mg | INTRAVENOUS | Status: AC
  Administered 2021-11-03: 80 mg
  Filled 2021-11-03: qty 80
  Filled 2021-11-03: qty 2

## 2021-11-03 MED ORDER — HEPARIN (PORCINE) IN NACL 1000-0.9 UT/500ML-% IV SOLN
INTRAVENOUS | Status: AC
  Filled 2021-11-03: qty 1000

## 2021-11-03 MED ORDER — DULOXETINE HCL 30 MG PO CPEP
30.0000 mg | ORAL_CAPSULE | Freq: Every day | ORAL | Status: DC
Start: 2021-11-03 — End: 2021-11-03

## 2021-11-03 MED ORDER — PREGABALIN 50 MG PO CAPS
100.0000 mg | ORAL_CAPSULE | Freq: Two times a day (BID) | ORAL | Status: DC
  Administered 2021-11-03 – 2021-11-04 (×3): 100 mg via ORAL
  Filled 2021-11-03 (×3): qty 2

## 2021-11-03 MED ORDER — CHLORHEXIDINE GLUCONATE CLOTH 2 % EX PADS
6.0000 | MEDICATED_PAD | Freq: Every day | CUTANEOUS | Status: DC
  Administered 2021-11-03 – 2021-11-04 (×2): 6 via TOPICAL

## 2021-11-03 MED ORDER — FERROUS SULFATE 325 (65 FE) MG PO TABS
325.0000 mg | ORAL_TABLET | Freq: Every day | ORAL | Status: DC
  Administered 2021-11-04: 325 mg via ORAL
  Filled 2021-11-03: qty 1

## 2021-11-03 MED ORDER — MIDAZOLAM HCL 2 MG/2ML IJ SOLN
INTRAMUSCULAR | Status: AC
  Filled 2021-11-03: qty 2

## 2021-11-03 MED ORDER — AMLODIPINE BESYLATE 5 MG PO TABS
5.0000 mg | ORAL_TABLET | Freq: Every day | ORAL | Status: DC
  Administered 2021-11-03: 5 mg via ORAL
  Filled 2021-11-03: qty 1

## 2021-11-03 MED ORDER — FAMOTIDINE 20 MG PO TABS
20.0000 mg | ORAL_TABLET | Freq: Every day | ORAL | Status: DC
  Administered 2021-11-03 – 2021-11-04 (×2): 20 mg via ORAL
  Filled 2021-11-03 (×2): qty 1

## 2021-11-03 MED ORDER — POTASSIUM CHLORIDE CRYS ER 10 MEQ PO TBCR
10.0000 meq | EXTENDED_RELEASE_TABLET | Freq: Two times a day (BID) | ORAL | Status: DC
  Administered 2021-11-03 – 2021-11-04 (×2): 10 meq via ORAL
  Filled 2021-11-03 (×2): qty 1

## 2021-11-03 MED ORDER — GLIPIZIDE 10 MG PO TABS
10.0000 mg | ORAL_TABLET | Freq: Two times a day (BID) | ORAL | Status: DC
  Administered 2021-11-04: 10 mg via ORAL
  Filled 2021-11-03 (×3): qty 1

## 2021-11-03 MED ORDER — MELOXICAM 7.5 MG PO TABS
7.5000 mg | ORAL_TABLET | Freq: Every day | ORAL | Status: DC
  Administered 2021-11-03 – 2021-11-04 (×2): 7.5 mg via ORAL
  Filled 2021-11-03 (×2): qty 1

## 2021-11-03 MED ORDER — HEPARIN (PORCINE) IN NACL 1000-0.9 UT/500ML-% IV SOLN
INTRAVENOUS | Status: DC | PRN
  Administered 2021-11-03: 500 mL

## 2021-11-03 MED ORDER — METOPROLOL SUCCINATE ER 50 MG PO TB24
50.0000 mg | ORAL_TABLET | Freq: Every day | ORAL | Status: DC
  Administered 2021-11-03: 50 mg via ORAL
  Filled 2021-11-03: qty 1

## 2021-11-03 MED ORDER — FENTANYL CITRATE (PF) 100 MCG/2ML IJ SOLN
INTRAMUSCULAR | Status: AC
  Filled 2021-11-03: qty 2

## 2021-11-03 MED ORDER — VANCOMYCIN HCL IN DEXTROSE 1-5 GM/200ML-% IV SOLN
1000.0000 mg | Freq: Two times a day (BID) | INTRAVENOUS | Status: AC
  Administered 2021-11-03: 1000 mg via INTRAVENOUS
  Filled 2021-11-03 (×2): qty 200

## 2021-11-03 MED ORDER — MIDAZOLAM HCL 2 MG/2ML IJ SOLN
INTRAMUSCULAR | Status: DC | PRN
  Administered 2021-11-03: 1 mg via INTRAVENOUS

## 2021-11-03 MED ORDER — OFLOXACIN 0.3 % OP SOLN: 1.0000 [drp] | Freq: Four times a day (QID) | OPHTHALMIC | Status: DC

## 2021-11-03 MED ORDER — FUROSEMIDE 20 MG PO TABS
20.0000 mg | ORAL_TABLET | Freq: Every day | ORAL | Status: DC
  Filled 2021-11-03: qty 1

## 2021-11-03 MED ORDER — LEVOTHYROXINE SODIUM 88 MCG PO TABS
88.0000 ug | ORAL_TABLET | Freq: Every day | ORAL | Status: DC
  Administered 2021-11-03 – 2021-11-04 (×2): 88 ug via ORAL
  Filled 2021-11-03: qty 1

## 2021-11-03 MED ORDER — VANCOMYCIN HCL IN DEXTROSE 1-5 GM/200ML-% IV SOLN
1000.0000 mg | INTRAVENOUS | Status: AC
  Administered 2021-11-03: 1000 mg via INTRAVENOUS
  Filled 2021-11-03 (×2): qty 200

## 2021-11-03 MED ORDER — CARVEDILOL 6.25 MG PO TABS
6.2500 mg | ORAL_TABLET | Freq: Two times a day (BID) | ORAL | Status: DC
  Administered 2021-11-04: 6.25 mg via ORAL
  Filled 2021-11-03: qty 1

## 2021-11-03 MED ORDER — PANTOPRAZOLE SODIUM 40 MG PO TBEC
40.0000 mg | DELAYED_RELEASE_TABLET | Freq: Every day | ORAL | Status: DC
  Administered 2021-11-04: 40 mg via ORAL
  Filled 2021-11-03: qty 1

## 2021-11-03 MED ORDER — IOHEXOL 300 MG/ML  SOLN
INTRAMUSCULAR | Status: DC | PRN
  Administered 2021-11-03: 15 mL

## 2021-11-03 MED ORDER — SODIUM CHLORIDE 0.9 % IV SOLN: INTRAVENOUS | Status: DC

## 2021-11-03 MED ORDER — DULOXETINE HCL 20 MG PO CPEP
20.0000 mg | ORAL_CAPSULE | Freq: Every day | ORAL | Status: DC
Start: 2021-11-03 — End: 2021-11-04
  Administered 2021-11-03 – 2021-11-04 (×2): 20 mg via ORAL
  Filled 2021-11-03 (×2): qty 1

## 2021-11-03 SURGICAL SUPPLY — 26 items
CABLE SURG 12 DISP A/V CHANNEL (MISCELLANEOUS) ×3 IMPLANT
COVER SURGICAL LIGHT HANDLE (MISCELLANEOUS) ×1 IMPLANT
DEVICE DSSCT PLSMBLD 3.0S LGHT (MISCELLANEOUS) IMPLANT
DRAPE INCISE 23X17 IOBAN STRL (DRAPES) ×1
DRAPE INCISE 23X17 STRL (DRAPES) IMPLANT
DRAPE INCISE IOBAN 23X17 STRL (DRAPES) ×1 IMPLANT
ELECT REM PT RETURN 9FT ADLT (ELECTROSURGICAL) ×2
ELECTRODE REM PT RTRN 9FT ADLT (ELECTROSURGICAL) IMPLANT
INTRO PACEMAKR LEAD 9FR 13CM (INTRODUCER) ×2
INTRO PACEMKR SHEATH II 7FR (MISCELLANEOUS) ×2
INTRODUCER PACEMKR LD 9FR 13CM (INTRODUCER) IMPLANT
INTRODUCER PACEMKR SHTH II 7FR (MISCELLANEOUS) IMPLANT
LEAD INGEVITY 7841 52 (Lead) ×1 IMPLANT
LEAD INGEVITY 7842 59 (Lead) ×1 IMPLANT
PACEMAKER ACCOLADE GR (Pacemaker) ×1 IMPLANT
PAD ELECT DEFIB RADIOL ZOLL (MISCELLANEOUS) ×3 IMPLANT
PLASMABLADE 3.0S W/LIGHT (MISCELLANEOUS) ×2
SLING ARM IMMOBILIZER MED (SOFTGOODS) ×1 IMPLANT
SPONGE XRAY 4X4 16PLY STRL (MISCELLANEOUS) ×3 IMPLANT
SUT SILK 0 FSL (SUTURE) ×2 IMPLANT
SUT VIC AB 2-0 CT1 27 (SUTURE) ×2
SUT VIC AB 2-0 CT1 TAPERPNT 27 (SUTURE) IMPLANT
SUT VICRYL 4-0  27 PS-2 BARIAT (SUTURE) ×2
SUT VICRYL 4-0 27 PS-2 BARIAT (SUTURE) ×1
SUTURE VICRYL 4-0 27 PS-2 BART (SUTURE) IMPLANT
TRAY PACEMAKER INSERTION (PACKS) ×3 IMPLANT

## 2021-11-03 NOTE — Progress Notes (Signed)
Christine Zimmerman underwent dual-chamber pacemaker placement this morning with Dr. Marcina Millard for symptomatic bradycardia.  I was called by [facility] radiology regarding a small left pneumothorax seen on postprocedure chest x-ray this afternoon.  Saw and evaluated the patient and she is comfortable on room air, with some soreness at her left chest but not short of breath or hypoxic.  Discussed with on-call critical care physician Dr. Barb Merino who recommended supplemental oxygen to maintain sats at 100% to help with reabsorption of the pneumothorax and supportive care otherwise.  No need for chest tube or other invasive treatment at this time   Physical exam: General: Pleasant elderly and thin female, well nourished, in no acute distress.  Sitting upright in hospital bed with husband, daughter, son at bedside HEENT:  Normocephalic and atraumatic. Neck:   No JVD.  Lungs: Normal respiratory effort on room air.  Clear to ascultation bilaterally  Chest: Left upper chest with clean dressing and Tegaderm overlying pacemaker site Heart: HRRR . Normal S1 and S2 without gallops or murmurs. Abdomen: non-distended appearing.  Msk: Normal strength and tone for age. Extremities: No clubbing, cyanosis, edema.  Left arm flexed in a sling Neuro: Alert and oriented x3 Psych: Answers questions appropriately  We will observe her overnight and repeat chest x-ray/reevaluate if she develops worsening shortness of breath or any other change in clinical status.  We will repeat chest x-ray tomorrow morning, and again next week  This patient's plan of care was discussed and created with Dr. Darrold Junker and he is in agreement.    Rebeca Allegra, PA-C

## 2021-11-03 NOTE — Progress Notes (Addendum)
Pt is complaining of itching but no order on mAR. NP Foust made aware. Per NP to notify the team. [facility] made aware. Will continue to monitor.  Update0 204: Re-page [facility]. Will continue to monitor.  Update 0221: MD Camnitz ordered zyrtec 10 mg tablet oral daily. Will continue to monitor.

## 2021-11-03 NOTE — Discharge Instructions (Addendum)
You can remove tegaderm (clear bandage) once you go home. Please do not remove the steri strips (white rectangular bandages) until you follow up in office with Dr. Juliann Pares. Please do not shower until tomorrow and try to keep the incision clean and dry. Please call the office  [phone]) if you have any drainage, redness, significant tenderness on your left chest where the pacemaker is, or with any other questions or concerns.

## 2021-11-03 NOTE — Discharge Summary (Incomplete)
Discharge Summary      Patient ID: SCOTT FIX MRN: [id number] DOB/AGE: [id number] y.o.  Admit date: 11/03/2021 Discharge date: 11/04/2021  Primary Discharge Diagnosis symptomatic bradycardia  Secondary Discharge Diagnosis same, s/p [employer] dual chamber pacemaker   Significant Diagnostic Studies: none  Consults: no formal consults, curbside pulmonary critical care   Hospital Course: The patient was brought to the cardiac cath lab and underwent [facility] [employer] dual chamber pacemaker placement with Dr. Marcina Millard on 11/03/2021. The patient tolerated with procedure well without periprocedural complications. The device was interrogated post procedure and is functioning appropriately with good threshold and lead impedance. On 11/04/2021 the pacemaker pocket was examined and was without significant erythema, tenderness to palpation, or drainage. Postprocedure chest xray revealed a small LEFT pneumothorax, discussed with critical care MD who recommended supplemental oxygen to maintain saturations at 100% to help with reabsorption. Repeat Cxr on 6/21 showed a stable LEFT apical medial pneumothorax.  She remained hemodynamically stable overnight and felt well without shortness of breath on day of discharge, although she reports some gait instability which has been ongoing even prior to pacemaker placement and family requested a rolling walker for assistance with ambulation and was seen by physical therapy at the family's request who recommend continued home health PT which I have ordered. I recommend she follow up with PCP regarding all home health physical therapy if needed moving forward. The patient was given aftercare instructions and warning symptoms to look out at the pacemaker pocket. Plan to repeat a chest xray next Monday 6/26 at [facility] and will follow up in office with Dr. Juliann Pares in 1 week, or sooner if needed.    Discharge Exam: Blood pressure (!)  184/72, pulse 60, temperature 97.9 F (36.6 C), resp. rate 18, height 5' (1.524 m), weight 64.4 kg, SpO2 95 %.   PHYSICAL EXAM General: Pleasant elderly and thin female, well nourished, in no acute distress.  Sitting upright in hospital bed with husband, daughter, son at bedside HEENT:  Normocephalic and atraumatic. Neck:   No JVD.  Lungs: Normal respiratory effort on room air.  Clear to ascultation bilaterally  Chest: Left upper chest with clean dressing and Tegaderm overlying pacemaker site Heart: HRRR . Normal S1 and S2 without gallops or murmurs. Abdomen: non-distended appearing.  Msk: Normal strength and tone for age. Extremities: No clubbing, cyanosis, edema.  Left arm flexed in a sling Neuro: Alert and oriented x3 Psych: Answers questions appropriately  Labs:   Lab Results  Component Value Date   WBC 4.8 10/14/2021   HGB 12.4 10/14/2021   HCT 36.6 10/14/2021   MCV 87.1 10/14/2021   PLT 425 (H) 10/14/2021   No results for input(s): "NA", "K", "CL", "CO2", "BUN", "CREATININE", "CALCIUM", "PROT", "BILITOT", "ALKPHOS", "ALT", "AST", "GLUCOSE" in the last 168 hours.  Invalid input(s): "LABALBU"    Radiology: chest xray 06/20 small LEFT pneumothorax, chest xray 6/21 stable LEFT apical medial pneumothorax.  EKG: Atrial paced rhythm, RBBB rate 63.  FOLLOW UP PLANS AND APPOINTMENTS  Allergies as of 11/04/2021       Reactions   Biaxin [clarithromycin] Hives   Homatropine Other (See Comments)   Hycodan   Januvia [sitagliptin] Other (See Comments)   Unknown   Macrodantin [nitrofurantoin Macrocrystal] Other (See Comments)   Unknown   Naproxen Other (See Comments)   Unknown   Propoxyphene Other (See Comments)   Darvocet   Tramadol Other (See Comments)   Unknown   Penicillins Rash, Other (See  Comments)   Has patient had a PCN reaction causing immediate rash, facial/tongue/throat swelling, SOB or lightheadedness with hypotension: No Has patient had a PCN reaction causing  severe rash involving mucus membranes or skin necrosis: No Has patient had a PCN reaction that required hospitalization: Yes Has patient had a PCN reaction occurring within the last 10 years: Unknown If all of the above answers are "NO", then may proceed with Cephalosporin use.        Medication List     STOP taking these medications    amLODipine 5 MG tablet Commonly known as: NORVASC   aspirin EC 81 MG tablet   atorvastatin 80 MG tablet Commonly known as: LIPITOR   chlorthalidone 25 MG tablet Commonly known as: HYGROTON   gabapentin 400 MG capsule Commonly known as: NEURONTIN   HAIR/SKIN/NAILS PO   hydrochlorothiazide 12.5 MG tablet Commonly known as: HYDRODIURIL   Jardiance 10 MG Tabs tablet Generic drug: empagliflozin   lisinopril 20 MG tablet Commonly known as: ZESTRIL   magnesium oxide 400 MG tablet Commonly known as: MAG-OX   meclizine 25 MG tablet Commonly known as: ANTIVERT   olmesartan 40 MG tablet Commonly known as: BENICAR   tiZANidine 2 MG tablet Commonly known as: ZANAFLEX       TAKE these medications    carvedilol 6.25 MG tablet Commonly known as: COREG Take 1 tablet (6.25 mg total) by mouth 2 (two) times daily with a meal.   clopidogrel 75 MG tablet Commonly known as: PLAVIX Take 75 mg by mouth daily.   diclofenac sodium 1 % Gel Commonly known as: VOLTAREN Apply 2 g topically 4 (four) times daily as needed.   DULoxetine 20 MG capsule Commonly known as: CYMBALTA Take 20 mg by mouth daily.   ferrous sulfate 325 (65 FE) MG tablet Take 1 tablet by mouth 2 (two) times daily with a meal.   furosemide 20 MG tablet Commonly known as: LASIX Take 20 mg by mouth daily as needed for edema.   glipiZIDE 5 MG 24 hr tablet Commonly known as: GLUCOTROL XL Take 1 tablet (5 mg total) by mouth 2 (two) times daily.   isosorbide mononitrate 30 MG 24 hr tablet Commonly known as: IMDUR Take 30 mg by mouth daily.   levothyroxine 88 MCG  tablet Commonly known as: SYNTHROID Take 88 mcg by mouth daily before breakfast.   meloxicam 7.5 MG tablet Commonly known as: MOBIC Take 7.5 mg by mouth daily.   metFORMIN 1000 MG tablet Commonly known as: GLUCOPHAGE Take 1,000 mg by mouth daily with breakfast.   niacin 500 MG tablet Take 500 mg by mouth 2 (two) times daily with a meal.   nitroGLYCERIN 0.4 MG/SPRAY spray Commonly known as: NITROLINGUAL Place 1 spray under the tongue every 5 (five) minutes x 3 doses as needed for chest pain.   pantoprazole 40 MG tablet Commonly known as: PROTONIX Take 40 mg by mouth every evening.   potassium chloride 10 MEQ tablet Commonly known as: KLOR-CON M Take 10 mEq by mouth daily as needed (with furosemide).   pregabalin 100 MG capsule Commonly known as: LYRICA Take 100 mg by mouth 3 (three) times daily.               Durable Medical Equipment  (From admission, onward)           Start     Ordered   11/04/21 1405  For home use only DME Walker rolling  Once  Question Answer Comment  Walker: With [place]   Patient needs a walker to treat with the following condition Falls frequently      11/04/21 1405   11/04/21 1403  For home use only DME Walker rolling  Once       Question Answer Comment  Walker: With [place]   Patient needs a walker to treat with the following condition Falls frequently      11/04/21 1405            Follow-up Information     Callwood, Dwayne D, MD. Go in 1 week(s).   Specialties: Cardiology, Internal Medicine Contact information: [place] [address] [phone]                 BRING ALL MEDICATIONS WITH YOU TO FOLLOW UP APPOINTMENTS  Time spent with patient: >60 mins Signed:  Tristan Schroeder PA-C 11/04/2021, 2:24 PM

## 2021-11-04 ENCOUNTER — Encounter: Payer: Self-pay | Admitting: Cardiology

## 2021-11-04 ENCOUNTER — Observation Stay: Payer: Medicare HMO

## 2021-11-04 DIAGNOSIS — Z7984 Long term (current) use of oral hypoglycemic drugs: Secondary | ICD-10-CM | POA: Diagnosis not present

## 2021-11-04 DIAGNOSIS — R296 Repeated falls: Secondary | ICD-10-CM | POA: Diagnosis not present

## 2021-11-04 DIAGNOSIS — M6281 Muscle weakness (generalized): Secondary | ICD-10-CM | POA: Diagnosis not present

## 2021-11-04 DIAGNOSIS — E119 Type 2 diabetes mellitus without complications: Secondary | ICD-10-CM | POA: Diagnosis not present

## 2021-11-04 DIAGNOSIS — R2681 Unsteadiness on feet: Secondary | ICD-10-CM | POA: Diagnosis not present

## 2021-11-04 DIAGNOSIS — R001 Bradycardia, unspecified: Secondary | ICD-10-CM | POA: Diagnosis not present

## 2021-11-04 DIAGNOSIS — Z950 Presence of cardiac pacemaker: Secondary | ICD-10-CM | POA: Diagnosis not present

## 2021-11-04 DIAGNOSIS — J939 Pneumothorax, unspecified: Secondary | ICD-10-CM | POA: Diagnosis not present

## 2021-11-04 MED ORDER — CARVEDILOL 6.25 MG PO TABS: 6.2500 mg | ORAL_TABLET | Freq: Two times a day (BID) | ORAL | 1 refills | Status: AC

## 2021-11-04 MED ORDER — CETIRIZINE HCL 10 MG PO TABS
10.0000 mg | ORAL_TABLET | Freq: Every day | ORAL | Status: DC
  Administered 2021-11-04: 10 mg via ORAL
  Filled 2021-11-04: qty 1

## 2021-11-04 NOTE — Evaluation (Signed)
Physical Therapy Evaluation Patient Details Name: Christine Zimmerman MRN: [id number] DOB: [birth year] Today's Date: 11/04/2021  History of Present Illness  Mrs. Stanton Kidney underwent dual-chamber pacemaker placement this morning with Dr. Marcina Millard for symptomatic bradycardia.  I was called by [facility] radiology regarding a small left pneumothorax seen on postprocedure chest x-ray this afternoon.  Saw and evaluated the patient and she is comfortable on room air, with some soreness at her left chest but not short of breath or hypoxic.  Discussed with on-call critical care physician Dr. Barb Merino who recommended supplemental oxygen to maintain sats at 100% to help with reabsorption of the pneumothorax and supportive care otherwise.  No need for chest tube or other invasive treatment at this time.   Clinical Impression  Patient received in bed, she is agreeable to PT assessment. Patient is mod independent with bed mobility, transfers with min guard and cues for correct hand placement. Patient ambulated 80-100 feet with RW. Trialed 4 wheeled walker, she did not feel comfortable with that. Will recommend 2 wheeled walker and HHPT upon discharge.          Recommendations for follow up therapy are one component of a multi-disciplinary discharge planning process, led by the attending physician.  Recommendations may be updated based on patient status, additional functional criteria and insurance authorization.  Follow Up Recommendations Home health PT      Assistance Recommended at Discharge Intermittent Supervision/Assistance  Patient can return home with the following  A little help with walking and/or transfers;A little help with bathing/dressing/bathroom;Assist for transportation;Help with stairs or ramp for entrance;Assistance with cooking/housework    Equipment Recommendations Rolling walker (2 wheels)  Recommendations for Other Services       Functional Status Assessment Patient has  had a recent decline in their functional status and demonstrates the ability to make significant improvements in function in a reasonable and predictable amount of time.     Precautions / Restrictions Precautions Precautions: Fall Restrictions Weight Bearing Restrictions: No      Mobility  Bed Mobility Overal bed mobility: Modified Independent             General bed mobility comments: increased time and effort needed    Transfers Overall transfer level: Modified independent Equipment used: Rolling walker (2 wheels)               General transfer comment: cues to push up from seated surface    Ambulation/Gait Ambulation/Gait assistance: Min guard Gait Distance (Feet): 80 Feet Assistive device: Rolling walker (2 wheels), Rollator (4 wheels) Gait Pattern/deviations: Step-through pattern, Decreased step length - right, Decreased step length - left, Decreased stride length Gait velocity: decr     General Gait Details: patient initially tried rollator, which she did not feel comfortable with and switched back to RW. Slow pace, small steps.  Stairs            Wheelchair Mobility    Modified Rankin (Stroke Patients Only)       Balance Overall balance assessment: Needs assistance, History of Falls Sitting-balance support: Feet supported Sitting balance-Leahy Scale: Good     Standing balance support: Bilateral upper extremity supported, During functional activity, Reliant on assistive device for balance Standing balance-Leahy Scale: Fair                               Pertinent Vitals/Pain Pain Assessment Pain Assessment: Faces Faces Pain Scale: Hurts a little bit Pain  Location: chest ( pacemaker placement site) Pain Descriptors / Indicators: Discomfort, Sore Pain Intervention(s): Monitored during session    Home Living Family/patient expects to be discharged to:: Private residence Living Arrangements: Spouse/significant  other;Children Available Help at Discharge: Family;Available 24 hours/day Type of Home: House Home Access: Stairs to enter Entrance Stairs-Rails: Doctor, general practice of Steps: 5   Home Layout: One level Home Equipment: Cane - single point      Prior Function Prior Level of Function : Independent/Modified Independent;History of Falls (last six months)             Mobility Comments: patient was using SPC prior to admission, having falls at home. ADLs Comments: independent     Hand Dominance        Extremity/Trunk Assessment   Upper Extremity Assessment Upper Extremity Assessment: Generalized weakness    Lower Extremity Assessment Lower Extremity Assessment: Generalized weakness    Cervical / Trunk Assessment Cervical / Trunk Assessment: Normal  Communication   Communication: No difficulties  Cognition Arousal/Alertness: Awake/alert Behavior During Therapy: WFL for tasks assessed/performed Overall Cognitive Status: Within Functional Limits for tasks assessed                                          General Comments      Exercises     Assessment/Plan    PT Assessment Patient needs continued PT services  PT Problem List Decreased strength;Decreased mobility;Decreased activity tolerance;Decreased balance;Pain;Decreased knowledge of use of DME       PT Treatment Interventions DME instruction;Therapeutic exercise;Gait training;Stair training;Functional mobility training;Therapeutic activities;Patient/family education    PT Goals (Current goals can be found in the Care Plan section)  Acute Rehab PT Goals Patient Stated Goal: to decrease pain PT Goal Formulation: With patient Time For Goal Achievement: 11/07/21 Potential to Achieve Goals: Good    Frequency Min 2X/week     Co-evaluation               AM-PAC PT "6 Clicks" Mobility  Outcome Measure Help needed turning from your back to your side while in a flat bed  without using bedrails?: A Little Help needed moving from lying on your back to sitting on the side of a flat bed without using bedrails?: A Little Help needed moving to and from a bed to a chair (including a wheelchair)?: A Little Help needed standing up from a chair using your arms (e.g., wheelchair or bedside chair)?: A Little Help needed to walk in hospital room?: A Little Help needed climbing 3-5 steps with a railing? : A Little 6 Click Score: 18    End of Session Equipment Utilized During Treatment: Gait belt Activity Tolerance: Patient tolerated treatment well Patient left: in bed;with call bell/phone within reach;with bed alarm set Nurse Communication: Mobility status PT Visit Diagnosis: Unsteadiness on feet (R26.81);Repeated falls (R29.6);Muscle weakness (generalized) (M62.81);Difficulty in walking, not elsewhere classified (R26.2);Pain Pain - Right/Left: Left Pain - part of body:  (surgical site)    Time: 3762-8315 PT Time Calculation (min) (ACUTE ONLY): 33 min   Charges:   PT Evaluation $PT Eval Low Complexity: 1 Low PT Treatments $Gait Training: 8-22 mins        Jerrett Baldinger, PT, GCS 11/04/21,2:31 PM

## 2021-11-04 NOTE — Plan of Care (Signed)
  Problem: Clinical Measurements: Goal: Ability to maintain clinical measurements within normal limits will improve Outcome: Progressing   Problem: Clinical Measurements: Goal: Will remain free from infection Outcome: Progressing   Problem: Clinical Measurements: Goal: Respiratory complications will improve Outcome: Progressing   Problem: Activity: Goal: Risk for activity intolerance will decrease Outcome: Progressing   Problem: Pain Managment: Goal: General experience of comfort will improve Outcome: Progressing   Problem: Safety: Goal: Ability to remain free from injury will improve Outcome: Progressing

## 2021-11-04 NOTE — Plan of Care (Signed)
Pulmonary Plan of Care Note  # Post-procedural pneumothorax, stable --I was called about the patient's post-procedural pneumothorax yesterday. She is stable and breathing comfortably.  --After reviewing the films yesterday, I recommended supplemental oxygen to aid in reabsorption of the patient's PTX.  --CXR yesterday demonstrates a small apical PTX with an additional small anterior component. Repeat CXR today demonstrates stability.   --Given stability between today and yesterday, anticipate that there will be resolution without any further intervention.  --The patient's pneumothorax can be monitored in the outpatient setting. If discharging today, would recommend follow-up CXR Friday or Monday to be followed up by the patient's PCP.  --Would give the patient strict return precautions for worsening shortness of breath or chest pain.   Please call with any questions or concerns.

## 2021-11-04 NOTE — TOC Transition Note (Signed)
Transition of Care [facility]) - CM/SW Discharge Note   Patient Details  Name: Christine Zimmerman MRN: [id number] Date of Birth: 1945/07/03  Transition of Care [facility]) CM/SW Contact:  Gildardo Griffes, LCSW Phone Number: 11/04/2021, 3:04 PM   Clinical Narrative:     Patient to discharge home today, will receive rolling walker at bedside via [employer]. Patient agreeable to [facility] PT, referral given to [facility] with [facility] and CSW has requested [facility] PT orders from [place]. Confirmed address in chart is accurate.  Patient reports her daughter will pick her up for discharge.   No further discharge needs.    Final next level of care: Home w Home Health Services Barriers to Discharge: No Barriers Identified   Patient Goals and CMS Choice Patient states their goals for this hospitalization and ongoing recovery are:: to go home CMS Medicare.gov Compare Post Acute Care list provided to:: Patient Choice offered to / list presented to : Patient  Discharge Placement                Patient to be transferred to facility by: daughter Name of family member notified: daugher Patient and family notified of of transfer: 11/04/21  Discharge Plan and Services                DME Arranged: Dan Humphreys rolling DME Agency: [employer] Date DME Agency Contacted: 11/04/21 Time DME Agency Contacted: 1504 Representative spoke with at DME Agency: Bjorn Loser HH Arranged: PT HH Agency: [facility] Date [facility] Agency Contacted: 11/04/21 Time HH Agency Contacted: 1504 Representative spoke with at [facility] Agency: Kandee Keen  Social Determinants of Health (SDOH) Interventions     Readmission Risk Interventions     No data to display

## 2021-11-04 NOTE — Progress Notes (Addendum)
Mobility Specialist - Progress Note   11/04/21 1100  Mobility  Activity Ambulated with assistance in hallway;Ambulated with assistance to bathroom  Level of Assistance Standby assist, set-up cues, supervision of patient - no hands on  Assistive Device Front wheel walker  Distance Ambulated (ft) 40 ft  Activity Response Tolerated well  $Mobility charge 1 Mobility     Pre-mobility: 67 HR, 99% SpO2 During mobility: 75 HR, 95% SpO2 Post-mobility: 71 HR, 95% SpO2   Pt lying in bed upon arrival, utilizing 2L. Weaned to RA for OOB activity with sats maintaining mid-high 90s throughout session. Per RN, okay to remove sling for activity. Pt able to exit bed with supervision and extra time. Pt ambulated to bathroom with SPC, however mildly unsteady as pt tends to bear most of her weight to the L. Opted over to RW for remainder of session with advisement to use light pressure on LUE while ambulating 40' x 2. Does voice LUE soreness and L chest pressure during activity. Also reports dizziness, headache, and pressure/soreness in BLE during ambulation with L > R. Cues for upright posture---slouched shoulders at times. Pt reports feeling like "she's going to fall backwards" throughout ambulation despite demonstrating fairly good balance throughout. A seated rest break was taken d/t fatigue. Pt ambulated back to room with dizziness increasing with head turns but resolved once seated. Pt left in bed with alarm set, needs in reach, daughter at bedside.    Christine Zimmerman Mobility Specialist 11/04/21, 11:44 AM

## 2021-11-05 DIAGNOSIS — I4510 Unspecified right bundle-branch block: Secondary | ICD-10-CM | POA: Diagnosis not present

## 2021-11-05 DIAGNOSIS — E871 Hypo-osmolality and hyponatremia: Secondary | ICD-10-CM | POA: Diagnosis not present

## 2021-11-05 DIAGNOSIS — I10 Essential (primary) hypertension: Secondary | ICD-10-CM | POA: Diagnosis not present

## 2021-11-05 DIAGNOSIS — G8929 Other chronic pain: Secondary | ICD-10-CM | POA: Diagnosis not present

## 2021-11-05 DIAGNOSIS — M25561 Pain in right knee: Secondary | ICD-10-CM | POA: Diagnosis not present

## 2021-11-05 DIAGNOSIS — E1165 Type 2 diabetes mellitus with hyperglycemia: Secondary | ICD-10-CM | POA: Diagnosis not present

## 2021-11-05 DIAGNOSIS — F330 Major depressive disorder, recurrent, mild: Secondary | ICD-10-CM | POA: Diagnosis not present

## 2021-11-05 DIAGNOSIS — E039 Hypothyroidism, unspecified: Secondary | ICD-10-CM | POA: Diagnosis not present

## 2021-11-05 NOTE — Progress Notes (Signed)
Adelina Mings with [facility] called and said patient is active with them, CSW has informed [place] with [place].   [place], [place] [phone]

## 2021-11-09 DIAGNOSIS — I5030 Unspecified diastolic (congestive) heart failure: Secondary | ICD-10-CM | POA: Diagnosis not present

## 2021-11-09 DIAGNOSIS — E039 Hypothyroidism, unspecified: Secondary | ICD-10-CM | POA: Diagnosis not present

## 2021-11-09 DIAGNOSIS — Z8709 Personal history of other diseases of the respiratory system: Secondary | ICD-10-CM | POA: Diagnosis not present

## 2021-11-09 DIAGNOSIS — I2510 Atherosclerotic heart disease of native coronary artery without angina pectoris: Secondary | ICD-10-CM | POA: Diagnosis not present

## 2021-11-09 DIAGNOSIS — Z09 Encounter for follow-up examination after completed treatment for conditions other than malignant neoplasm: Secondary | ICD-10-CM | POA: Diagnosis not present

## 2021-11-09 DIAGNOSIS — J939 Pneumothorax, unspecified: Secondary | ICD-10-CM | POA: Diagnosis not present

## 2021-11-09 DIAGNOSIS — Z7984 Long term (current) use of oral hypoglycemic drugs: Secondary | ICD-10-CM | POA: Diagnosis not present

## 2021-11-09 DIAGNOSIS — R262 Difficulty in walking, not elsewhere classified: Secondary | ICD-10-CM | POA: Diagnosis not present

## 2021-11-09 DIAGNOSIS — I110 Hypertensive heart disease with heart failure: Secondary | ICD-10-CM | POA: Diagnosis not present

## 2021-11-09 DIAGNOSIS — E1140 Type 2 diabetes mellitus with diabetic neuropathy, unspecified: Secondary | ICD-10-CM | POA: Diagnosis not present

## 2021-11-09 DIAGNOSIS — Z950 Presence of cardiac pacemaker: Secondary | ICD-10-CM | POA: Diagnosis not present

## 2021-11-12 DIAGNOSIS — E1165 Type 2 diabetes mellitus with hyperglycemia: Secondary | ICD-10-CM | POA: Diagnosis not present

## 2021-11-12 DIAGNOSIS — R29898 Other symptoms and signs involving the musculoskeletal system: Secondary | ICD-10-CM | POA: Diagnosis not present

## 2021-11-12 DIAGNOSIS — I10 Essential (primary) hypertension: Secondary | ICD-10-CM | POA: Diagnosis not present

## 2021-11-12 DIAGNOSIS — I495 Sick sinus syndrome: Secondary | ICD-10-CM | POA: Diagnosis not present

## 2021-11-12 DIAGNOSIS — Z955 Presence of coronary angioplasty implant and graft: Secondary | ICD-10-CM | POA: Diagnosis not present

## 2021-11-12 DIAGNOSIS — Z950 Presence of cardiac pacemaker: Secondary | ICD-10-CM | POA: Diagnosis not present

## 2021-11-12 DIAGNOSIS — I25118 Atherosclerotic heart disease of native coronary artery with other forms of angina pectoris: Secondary | ICD-10-CM | POA: Diagnosis not present

## 2021-11-12 DIAGNOSIS — E782 Mixed hyperlipidemia: Secondary | ICD-10-CM | POA: Diagnosis not present

## 2021-11-12 DIAGNOSIS — R001 Bradycardia, unspecified: Secondary | ICD-10-CM | POA: Diagnosis not present

## 2021-11-12 DIAGNOSIS — R600 Localized edema: Secondary | ICD-10-CM | POA: Diagnosis not present

## 2021-12-04 DIAGNOSIS — M25561 Pain in right knee: Secondary | ICD-10-CM | POA: Diagnosis not present

## 2021-12-04 DIAGNOSIS — M170 Bilateral primary osteoarthritis of knee: Secondary | ICD-10-CM | POA: Diagnosis not present

## 2021-12-04 DIAGNOSIS — G8929 Other chronic pain: Secondary | ICD-10-CM | POA: Diagnosis not present

## 2021-12-04 DIAGNOSIS — M5136 Other intervertebral disc degeneration, lumbar region: Secondary | ICD-10-CM | POA: Diagnosis not present

## 2021-12-07 DIAGNOSIS — E1140 Type 2 diabetes mellitus with diabetic neuropathy, unspecified: Secondary | ICD-10-CM | POA: Diagnosis not present

## 2021-12-07 DIAGNOSIS — L851 Acquired keratosis [keratoderma] palmaris et plantaris: Secondary | ICD-10-CM | POA: Diagnosis not present

## 2021-12-07 DIAGNOSIS — B351 Tinea unguium: Secondary | ICD-10-CM | POA: Diagnosis not present

## 2021-12-09 DIAGNOSIS — R5381 Other malaise: Secondary | ICD-10-CM | POA: Diagnosis not present

## 2021-12-09 DIAGNOSIS — Z09 Encounter for follow-up examination after completed treatment for conditions other than malignant neoplasm: Secondary | ICD-10-CM | POA: Diagnosis not present

## 2021-12-09 DIAGNOSIS — I2510 Atherosclerotic heart disease of native coronary artery without angina pectoris: Secondary | ICD-10-CM | POA: Diagnosis not present

## 2021-12-09 DIAGNOSIS — R262 Difficulty in walking, not elsewhere classified: Secondary | ICD-10-CM | POA: Diagnosis not present

## 2021-12-09 DIAGNOSIS — I10 Essential (primary) hypertension: Secondary | ICD-10-CM | POA: Diagnosis not present

## 2021-12-09 DIAGNOSIS — R5383 Other fatigue: Secondary | ICD-10-CM | POA: Diagnosis not present

## 2021-12-09 DIAGNOSIS — E538 Deficiency of other specified B group vitamins: Secondary | ICD-10-CM | POA: Diagnosis not present

## 2021-12-09 DIAGNOSIS — E039 Hypothyroidism, unspecified: Secondary | ICD-10-CM | POA: Diagnosis not present

## 2021-12-09 DIAGNOSIS — E1165 Type 2 diabetes mellitus with hyperglycemia: Secondary | ICD-10-CM | POA: Diagnosis not present

## 2021-12-09 DIAGNOSIS — R600 Localized edema: Secondary | ICD-10-CM | POA: Diagnosis not present

## 2021-12-09 DIAGNOSIS — Z950 Presence of cardiac pacemaker: Secondary | ICD-10-CM | POA: Diagnosis not present

## 2021-12-09 DIAGNOSIS — F330 Major depressive disorder, recurrent, mild: Secondary | ICD-10-CM | POA: Diagnosis not present

## 2021-12-09 DIAGNOSIS — E559 Vitamin D deficiency, unspecified: Secondary | ICD-10-CM | POA: Diagnosis not present

## 2021-12-16 DIAGNOSIS — E039 Hypothyroidism, unspecified: Secondary | ICD-10-CM | POA: Diagnosis not present

## 2021-12-16 DIAGNOSIS — E538 Deficiency of other specified B group vitamins: Secondary | ICD-10-CM | POA: Diagnosis not present

## 2021-12-16 DIAGNOSIS — Z950 Presence of cardiac pacemaker: Secondary | ICD-10-CM | POA: Diagnosis not present

## 2021-12-16 DIAGNOSIS — R1031 Right lower quadrant pain: Secondary | ICD-10-CM | POA: Diagnosis not present

## 2021-12-16 DIAGNOSIS — Z0000 Encounter for general adult medical examination without abnormal findings: Secondary | ICD-10-CM | POA: Diagnosis not present

## 2021-12-16 DIAGNOSIS — R262 Difficulty in walking, not elsewhere classified: Secondary | ICD-10-CM | POA: Diagnosis not present

## 2021-12-16 DIAGNOSIS — E1140 Type 2 diabetes mellitus with diabetic neuropathy, unspecified: Secondary | ICD-10-CM | POA: Diagnosis not present

## 2021-12-16 DIAGNOSIS — I2510 Atherosclerotic heart disease of native coronary artery without angina pectoris: Secondary | ICD-10-CM | POA: Diagnosis not present

## 2021-12-16 DIAGNOSIS — I110 Hypertensive heart disease with heart failure: Secondary | ICD-10-CM | POA: Diagnosis not present

## 2021-12-28 DIAGNOSIS — I5030 Unspecified diastolic (congestive) heart failure: Secondary | ICD-10-CM | POA: Diagnosis not present

## 2021-12-28 DIAGNOSIS — K219 Gastro-esophageal reflux disease without esophagitis: Secondary | ICD-10-CM | POA: Diagnosis not present

## 2021-12-28 DIAGNOSIS — I110 Hypertensive heart disease with heart failure: Secondary | ICD-10-CM | POA: Diagnosis not present

## 2021-12-28 DIAGNOSIS — I2510 Atherosclerotic heart disease of native coronary artery without angina pectoris: Secondary | ICD-10-CM | POA: Diagnosis not present

## 2021-12-28 DIAGNOSIS — E1165 Type 2 diabetes mellitus with hyperglycemia: Secondary | ICD-10-CM | POA: Diagnosis not present

## 2021-12-28 DIAGNOSIS — E1140 Type 2 diabetes mellitus with diabetic neuropathy, unspecified: Secondary | ICD-10-CM | POA: Diagnosis not present

## 2021-12-28 DIAGNOSIS — E039 Hypothyroidism, unspecified: Secondary | ICD-10-CM | POA: Diagnosis not present

## 2022-01-07 DIAGNOSIS — R748 Abnormal levels of other serum enzymes: Secondary | ICD-10-CM | POA: Diagnosis not present

## 2022-01-07 DIAGNOSIS — R1031 Right lower quadrant pain: Secondary | ICD-10-CM | POA: Diagnosis not present

## 2022-01-07 DIAGNOSIS — Z86010 Personal history of colonic polyps: Secondary | ICD-10-CM | POA: Diagnosis not present

## 2022-01-08 ENCOUNTER — Other Ambulatory Visit: Payer: Self-pay | Admitting: Nurse Practitioner

## 2022-01-08 DIAGNOSIS — R1031 Right lower quadrant pain: Secondary | ICD-10-CM

## 2022-01-29 ENCOUNTER — Ambulatory Visit: Admission: RE | Admit: 2022-01-29 | Payer: Medicare HMO | Source: Ambulatory Visit

## 2022-02-02 ENCOUNTER — Other Ambulatory Visit: Payer: Self-pay | Admitting: Nurse Practitioner

## 2022-02-02 DIAGNOSIS — R1031 Right lower quadrant pain: Secondary | ICD-10-CM

## 2022-02-04 DIAGNOSIS — Z950 Presence of cardiac pacemaker: Secondary | ICD-10-CM | POA: Diagnosis not present

## 2022-02-04 DIAGNOSIS — E782 Mixed hyperlipidemia: Secondary | ICD-10-CM | POA: Diagnosis not present

## 2022-02-04 DIAGNOSIS — E119 Type 2 diabetes mellitus without complications: Secondary | ICD-10-CM | POA: Diagnosis not present

## 2022-02-04 DIAGNOSIS — I10 Essential (primary) hypertension: Secondary | ICD-10-CM | POA: Diagnosis not present

## 2022-02-04 DIAGNOSIS — Z955 Presence of coronary angioplasty implant and graft: Secondary | ICD-10-CM | POA: Diagnosis not present

## 2022-02-04 DIAGNOSIS — R29898 Other symptoms and signs involving the musculoskeletal system: Secondary | ICD-10-CM | POA: Diagnosis not present

## 2022-02-04 DIAGNOSIS — I495 Sick sinus syndrome: Secondary | ICD-10-CM | POA: Diagnosis not present

## 2022-02-04 DIAGNOSIS — R600 Localized edema: Secondary | ICD-10-CM | POA: Diagnosis not present

## 2022-02-04 DIAGNOSIS — I25118 Atherosclerotic heart disease of native coronary artery with other forms of angina pectoris: Secondary | ICD-10-CM | POA: Diagnosis not present

## 2022-02-19 DIAGNOSIS — Z23 Encounter for immunization: Secondary | ICD-10-CM | POA: Diagnosis not present

## 2022-03-10 DIAGNOSIS — R1031 Right lower quadrant pain: Secondary | ICD-10-CM | POA: Diagnosis not present

## 2022-03-10 DIAGNOSIS — I10 Essential (primary) hypertension: Secondary | ICD-10-CM | POA: Diagnosis not present

## 2022-03-10 DIAGNOSIS — E1165 Type 2 diabetes mellitus with hyperglycemia: Secondary | ICD-10-CM | POA: Diagnosis not present

## 2022-03-10 DIAGNOSIS — E538 Deficiency of other specified B group vitamins: Secondary | ICD-10-CM | POA: Diagnosis not present

## 2022-03-10 DIAGNOSIS — E559 Vitamin D deficiency, unspecified: Secondary | ICD-10-CM | POA: Diagnosis not present

## 2022-03-10 DIAGNOSIS — E039 Hypothyroidism, unspecified: Secondary | ICD-10-CM | POA: Diagnosis not present

## 2022-03-10 DIAGNOSIS — R7989 Other specified abnormal findings of blood chemistry: Secondary | ICD-10-CM | POA: Diagnosis not present

## 2022-03-11 DIAGNOSIS — E538 Deficiency of other specified B group vitamins: Secondary | ICD-10-CM | POA: Diagnosis not present

## 2022-03-11 DIAGNOSIS — B351 Tinea unguium: Secondary | ICD-10-CM | POA: Diagnosis not present

## 2022-03-11 DIAGNOSIS — S90122A Contusion of left lesser toe(s) without damage to nail, initial encounter: Secondary | ICD-10-CM | POA: Diagnosis not present

## 2022-03-11 DIAGNOSIS — E559 Vitamin D deficiency, unspecified: Secondary | ICD-10-CM | POA: Diagnosis not present

## 2022-03-11 DIAGNOSIS — E1140 Type 2 diabetes mellitus with diabetic neuropathy, unspecified: Secondary | ICD-10-CM | POA: Diagnosis not present

## 2022-03-11 DIAGNOSIS — R262 Difficulty in walking, not elsewhere classified: Secondary | ICD-10-CM | POA: Diagnosis not present

## 2022-03-11 DIAGNOSIS — I110 Hypertensive heart disease with heart failure: Secondary | ICD-10-CM | POA: Diagnosis not present

## 2022-03-11 DIAGNOSIS — M159 Polyosteoarthritis, unspecified: Secondary | ICD-10-CM | POA: Diagnosis not present

## 2022-03-11 DIAGNOSIS — E039 Hypothyroidism, unspecified: Secondary | ICD-10-CM | POA: Diagnosis not present

## 2022-03-11 DIAGNOSIS — L851 Acquired keratosis [keratoderma] palmaris et plantaris: Secondary | ICD-10-CM | POA: Diagnosis not present

## 2022-03-11 DIAGNOSIS — S9032XA Contusion of left foot, initial encounter: Secondary | ICD-10-CM | POA: Diagnosis not present

## 2022-03-11 DIAGNOSIS — M7672 Peroneal tendinitis, left leg: Secondary | ICD-10-CM | POA: Diagnosis not present

## 2022-03-11 DIAGNOSIS — I5030 Unspecified diastolic (congestive) heart failure: Secondary | ICD-10-CM | POA: Diagnosis not present

## 2022-04-02 DIAGNOSIS — G5603 Carpal tunnel syndrome, bilateral upper limbs: Secondary | ICD-10-CM | POA: Diagnosis not present

## 2022-04-02 DIAGNOSIS — I495 Sick sinus syndrome: Secondary | ICD-10-CM | POA: Diagnosis not present

## 2022-04-02 DIAGNOSIS — R260 Ataxic gait: Secondary | ICD-10-CM | POA: Diagnosis not present

## 2022-04-02 DIAGNOSIS — M797 Fibromyalgia: Secondary | ICD-10-CM | POA: Diagnosis not present

## 2022-04-02 DIAGNOSIS — M545 Low back pain, unspecified: Secondary | ICD-10-CM | POA: Diagnosis not present

## 2022-04-02 DIAGNOSIS — I10 Essential (primary) hypertension: Secondary | ICD-10-CM | POA: Diagnosis not present

## 2022-04-02 DIAGNOSIS — R252 Cramp and spasm: Secondary | ICD-10-CM | POA: Diagnosis not present

## 2022-04-02 DIAGNOSIS — M5136 Other intervertebral disc degeneration, lumbar region: Secondary | ICD-10-CM | POA: Diagnosis not present

## 2022-04-02 DIAGNOSIS — R2689 Other abnormalities of gait and mobility: Secondary | ICD-10-CM | POA: Diagnosis not present

## 2022-04-07 ENCOUNTER — Encounter: Payer: Self-pay | Admitting: Gastroenterology

## 2022-04-07 DIAGNOSIS — M1812 Unilateral primary osteoarthritis of first carpometacarpal joint, left hand: Secondary | ICD-10-CM | POA: Diagnosis not present

## 2022-04-07 DIAGNOSIS — M5136 Other intervertebral disc degeneration, lumbar region: Secondary | ICD-10-CM | POA: Diagnosis not present

## 2022-04-07 DIAGNOSIS — M170 Bilateral primary osteoarthritis of knee: Secondary | ICD-10-CM | POA: Diagnosis not present

## 2022-04-07 DIAGNOSIS — L299 Pruritus, unspecified: Secondary | ICD-10-CM | POA: Diagnosis not present

## 2022-04-11 NOTE — H&P (Signed)
Patient took her plavix this morning and has not paused in preparation for the procedure. Will reschedule  Enis Slipper, DO [facility]

## 2022-04-12 ENCOUNTER — Encounter: Payer: Self-pay | Admitting: Anesthesiology

## 2022-04-12 ENCOUNTER — Other Ambulatory Visit: Payer: Self-pay

## 2022-04-12 ENCOUNTER — Ambulatory Visit
Admission: RE | Admit: 2022-04-12 | Discharge: 2022-04-12 | Disposition: A | Discharge status: Home / Self Care | Payer: Medicare HMO | Source: Ambulatory Visit | Attending: Gastroenterology | Admitting: Gastroenterology

## 2022-04-12 ENCOUNTER — Encounter
Admission: RE | Disposition: A | Discharge status: Home / Self Care | Payer: Self-pay | Source: Ambulatory Visit | Attending: Gastroenterology

## 2022-04-12 DIAGNOSIS — Z86010 Personal history of colonic polyps: Secondary | ICD-10-CM | POA: Diagnosis not present

## 2022-04-12 DIAGNOSIS — R1031 Right lower quadrant pain: Secondary | ICD-10-CM | POA: Diagnosis not present

## 2022-04-12 DIAGNOSIS — Z5309 Procedure and treatment not carried out because of other contraindication: Secondary | ICD-10-CM | POA: Insufficient documentation

## 2022-04-12 HISTORY — PX: COLONOSCOPY WITH PROPOFOL: SHX5780

## 2022-04-12 SURGERY — COLONOSCOPY WITH PROPOFOL
Anesthesia: General

## 2022-04-12 MED ORDER — SODIUM CHLORIDE 0.9 % IV SOLN: INTRAVENOUS | Status: DC

## 2022-04-12 NOTE — Progress Notes (Signed)
Patient did not stop her Plavix for the procedure

## 2022-04-13 ENCOUNTER — Encounter: Payer: Self-pay | Admitting: Gastroenterology

## 2022-04-14 NOTE — Congregational Nurse Program (Signed)
  Dept: [phone]   Congregational Nurse Program Note  Date of Encounter: 04/14/2022  Past Medical History: Past Medical History:  Diagnosis Date   Atrial fibrillation (HCC)    CHF (congestive heart failure) (HCC)    Chronic pain    Coronary artery disease    Diabetes mellitus without complication (HCC)    GERD (gastroesophageal reflux disease)    High cholesterol    Hypertension    Hypothyroidism     Encounter Details:  CNP Questionnaire - 04/14/22 1430       Questionnaire   Ask client: Do you give verbal consent for me to treat you today? Yes    Student Assistance N/A    Location Patient Information systems manager, [employer]    Visit Setting with Hospital doctor    Patient Status Unknown   owns home   Insurance Medicare;Private or VA Insurance    Insurance/Financial Assistance Referral N/A    Medication N/A    Medical Provider Yes   internal medicine [facility]   Screening Referrals Made N/A    Medical Referrals Made N/A    Medical Appointment Made N/A    Recently w/o PCP, now 1st time PCP visit completed due to CNs referral or appointment made N/A    Food Have Food Insecurities    Transportation Need transportation assistance    Housing/Utilities N/A    Interpersonal Safety N/A    Interventions Advocate/Support;Navigate Healthcare System    Abnormal to Normal Screening Since Last CN Visit N/A    Screenings CN Performed N/A    Sent Client to Lab for: N/A    Did client attend any of the following based off CNs referral or appointments made? N/A    ED Visit Averted N/A    Life-Saving Intervention Made N/A            client and daughter, [name], requests suggestions for management of care in light of failing health of client and her husband.. husband's dr appt today was cancelled due to him having a fever. Provided COVID test. (Family tested yesterday with expired test they had on hand. Today is day 3 of symptoms. Co to test again today.) Client doesn't  have symptoms. She has an array of health issues (see chart review of recent hospitalizations); recently had pace maker placed; needs knee surgery; needs colonoscopy (cancelled Monday); now couple is fragile enough that they assistance with transportation. Daughter has been coming down from [place] to help navigate health care system but is frustrated. Neighbor is watching out for parents. Parents refuse to move to [place] to be nearer her. She needs to return to [facility] Tuesday. Nurse to explore services available for family and assist family with access to transportation services. Continue follow up to coordinate care. Rhermann, RN

## 2022-04-29 ENCOUNTER — Ambulatory Visit: Payer: Medicare HMO | Admitting: Anesthesiology

## 2022-04-29 ENCOUNTER — Ambulatory Visit
Admission: RE | Admit: 2022-04-29 | Discharge: 2022-04-29 | Disposition: A | Discharge status: Home / Self Care | Payer: Medicare HMO | Attending: Gastroenterology | Admitting: Gastroenterology

## 2022-04-29 ENCOUNTER — Encounter
Admission: RE | Disposition: A | Discharge status: Home / Self Care | Payer: Self-pay | Source: Home / Self Care | Attending: Gastroenterology

## 2022-04-29 ENCOUNTER — Encounter: Payer: Self-pay | Admitting: Gastroenterology

## 2022-04-29 DIAGNOSIS — Z955 Presence of coronary angioplasty implant and graft: Secondary | ICD-10-CM | POA: Insufficient documentation

## 2022-04-29 DIAGNOSIS — Z09 Encounter for follow-up examination after completed treatment for conditions other than malignant neoplasm: Secondary | ICD-10-CM | POA: Diagnosis not present

## 2022-04-29 DIAGNOSIS — Z86010 Personal history of colonic polyps: Secondary | ICD-10-CM | POA: Diagnosis not present

## 2022-04-29 DIAGNOSIS — K219 Gastro-esophageal reflux disease without esophagitis: Secondary | ICD-10-CM | POA: Diagnosis not present

## 2022-04-29 DIAGNOSIS — I4891 Unspecified atrial fibrillation: Secondary | ICD-10-CM | POA: Insufficient documentation

## 2022-04-29 DIAGNOSIS — G8929 Other chronic pain: Secondary | ICD-10-CM | POA: Diagnosis not present

## 2022-04-29 DIAGNOSIS — Z1211 Encounter for screening for malignant neoplasm of colon: Secondary | ICD-10-CM | POA: Insufficient documentation

## 2022-04-29 DIAGNOSIS — I509 Heart failure, unspecified: Secondary | ICD-10-CM | POA: Insufficient documentation

## 2022-04-29 DIAGNOSIS — I2510 Atherosclerotic heart disease of native coronary artery without angina pectoris: Secondary | ICD-10-CM | POA: Diagnosis not present

## 2022-04-29 DIAGNOSIS — Z7902 Long term (current) use of antithrombotics/antiplatelets: Secondary | ICD-10-CM | POA: Insufficient documentation

## 2022-04-29 DIAGNOSIS — K640 First degree hemorrhoids: Secondary | ICD-10-CM | POA: Diagnosis not present

## 2022-04-29 DIAGNOSIS — I110 Hypertensive heart disease with heart failure: Secondary | ICD-10-CM | POA: Insufficient documentation

## 2022-04-29 DIAGNOSIS — K649 Unspecified hemorrhoids: Secondary | ICD-10-CM | POA: Diagnosis not present

## 2022-04-29 DIAGNOSIS — Z950 Presence of cardiac pacemaker: Secondary | ICD-10-CM | POA: Insufficient documentation

## 2022-04-29 DIAGNOSIS — E119 Type 2 diabetes mellitus without complications: Secondary | ICD-10-CM | POA: Insufficient documentation

## 2022-04-29 DIAGNOSIS — E039 Hypothyroidism, unspecified: Secondary | ICD-10-CM | POA: Insufficient documentation

## 2022-04-29 HISTORY — PX: COLONOSCOPY: SHX5424

## 2022-04-29 HISTORY — DX: Presence of cardiac pacemaker: Z95.0

## 2022-04-29 LAB — GLUCOSE, CAPILLARY: Glucose-Capillary: 197 mg/dL — ABNORMAL HIGH (ref 70–99)

## 2022-04-29 SURGERY — COLONOSCOPY
Anesthesia: General

## 2022-04-29 MED ORDER — SODIUM CHLORIDE 0.9 % IV SOLN: INTRAVENOUS | Status: DC | PRN

## 2022-04-29 MED ORDER — PROPOFOL 10 MG/ML IV BOLUS
INTRAVENOUS | Status: DC | PRN
  Administered 2022-04-29: 40 mg via INTRAVENOUS

## 2022-04-29 MED ORDER — LIDOCAINE HCL (CARDIAC) PF 100 MG/5ML IV SOSY
PREFILLED_SYRINGE | INTRAVENOUS | Status: DC | PRN
  Administered 2022-04-29: 60 mg via INTRAVENOUS

## 2022-04-29 MED ORDER — SODIUM CHLORIDE 0.9 % IV SOLN: INTRAVENOUS | Status: DC

## 2022-04-29 MED ORDER — PROPOFOL 500 MG/50ML IV EMUL
INTRAVENOUS | Status: DC | PRN
  Administered 2022-04-29: 100 ug/kg/min via INTRAVENOUS

## 2022-04-29 MED ORDER — DEXMEDETOMIDINE HCL IN NACL 80 MCG/20ML IV SOLN
INTRAVENOUS | Status: DC | PRN
  Administered 2022-04-29: 8 ug via BUCCAL

## 2022-04-29 NOTE — Interval H&P Note (Signed)
History and Physical Interval Note: Preprocedure H&P from 04/29/22  was reviewed and there was no interval change after seeing and examining the patient.  Written consent was obtained from the patient after discussion of risks, benefits, and alternatives. Patient has consented to proceed with Colonoscopy with possible intervention   04/29/2022 9:04 AM  Christine Zimmerman  has presented today for surgery, with the diagnosis of Abdominal pain, RLQ (right lower quadrant) (R10.31) Hx of adenomatous colonic polyps (Z86.010).  The various methods of treatment have been discussed with the patient and family. After consideration of risks, benefits and other options for treatment, the patient has consented to  Procedure(s): COLONOSCOPY (N/A) as a surgical intervention.  The patient's history has been reviewed, patient examined, no change in status, stable for surgery.  I have reviewed the patient's chart and labs.  Questions were answered to the patient's satisfaction.     Jaynie Collins

## 2022-04-29 NOTE — Anesthesia Postprocedure Evaluation (Signed)
Anesthesia Post Note  Patient: Christine Zimmerman  Procedure(s) Performed: COLONOSCOPY  Anesthesia Type: General Anesthetic complications: no   No notable events documented.   Last Vitals:  Vitals:   04/29/22 0950 04/29/22 1000  BP: 122/61 (!) 165/89  Pulse: (!) 58 (!) 58  Resp: 13 12  Temp:    SpO2: 100% 100%    Last Pain:  Vitals:   04/29/22 1000  TempSrc:   PainSc: 0-No pain                 Yevette Edwards

## 2022-04-29 NOTE — Op Note (Signed)
[facility]enter[facility] Patient Name: Christine Zimmerman Procedure Date: 04/29/2022 8:59 AM MRN: [id number] Account #: [id number] Date of Birth: 03-May-1946 Admit Type: Outpatient Age: 76 Room: [facility] ENDO ROOM 1 Gender: Female Note Status: Finalized Instrument Name: Colonoscope [id number] Procedure:             Colonoscopy Indications:           High risk colon cancer surveillance: Personal history                         of colonic polyps Providers:             Annamaria Helling DO, DO Referring MD:          Tracie Harrier, MD (Referring MD) Medicines:             Monitored Anesthesia Care Complications:         No immediate complications. Estimated blood loss: None. Procedure:             Pre-Anesthesia Assessment:                        - Prior to the procedure, a History and Physical was                         performed, and patient medications and allergies were                         reviewed. The patient is competent. The risks and                         benefits of the procedure and the sedation options and                         risks were discussed with the patient. All questions                         were answered and informed consent was obtained.                         Patient identification and proposed procedure were                         verified by the physician, the nurse, the anesthetist                         and the technician in the endoscopy suite. Mental                         Status Examination: alert and oriented. Airway                         Examination: normal oropharyngeal airway and neck                         mobility. Respiratory Examination: clear to                         auscultation. CV Examination: RRR, no murmurs, no S3  or S4. Prophylactic Antibiotics: The patient does not                         require prophylactic antibiotics. Prior                         Anticoagulants: The patient  has taken Plavix                         (clopidogrel), last dose was 7 days prior to                         procedure. ASA Grade Assessment: III - A patient with                         severe systemic disease. After reviewing the risks and                         benefits, the patient was deemed in satisfactory                         condition to undergo the procedure. The anesthesia                         plan was to use monitored anesthesia care (MAC).                         Immediately prior to administration of medications,                         the patient was re-assessed for adequacy to receive                         sedatives. The heart rate, respiratory rate, oxygen                         saturations, blood pressure, adequacy of pulmonary                         ventilation, and response to care were monitored                         throughout the procedure. The physical status of the                         patient was re-assessed after the procedure.                        After obtaining informed consent, the colonoscope was                         passed under direct vision. Throughout the procedure,                         the patient's blood pressure, pulse, and oxygen                         saturations were monitored continuously. The  Colonoscope was introduced through the anus and                         advanced to the the terminal ileum, with                         identification of the appendiceal orifice and IC                         valve. The colonoscopy was performed without                         difficulty. The patient tolerated the procedure well.                         The quality of the bowel preparation was evaluated                         using the BBPS [facility] Bowel Preparation Scale) with                         scores of: Right Colon = 2 (minor amount of residual                         staining, small fragments of  stool and/or opaque                         liquid, but mucosa seen well), Transverse Colon = 3                         (entire mucosa seen well with no residual staining,                         small fragments of stool or opaque liquid) and Left                         Colon = 3 (entire mucosa seen well with no residual                         staining, small fragments of stool or opaque liquid).                         The total BBPS score equals 8. The quality of the                         bowel preparation was excellent. The terminal ileum,                         ileocecal valve, appendiceal orifice, and rectum were                         photographed. The terminal ileum, ileocecal valve,                         appendiceal orifice, and rectum were photographed. Findings:      The perianal and digital rectal examinations were normal. Pertinent  negatives include normal sphincter tone.      The terminal ileum appeared normal. Estimated blood loss: none.      Non-bleeding internal hemorrhoids were found during retroflexion. The       hemorrhoids were Grade I (internal hemorrhoids that do not prolapse).      Retroflexion in the right colon was performed.      The exam was otherwise without abnormality on direct and retroflexion       views. Impression:            - The examined portion of the ileum was normal.                        - Non-bleeding internal hemorrhoids.                        - The examination was otherwise normal on direct and                         retroflexion views.                        - No specimens collected. Recommendation:        - Patient has a contact number available for                         emergencies. The signs and symptoms of potential                         delayed complications were discussed with the patient.                         Return to normal activities tomorrow. Written                         discharge instructions were  provided to the patient.                        - Discharge patient to home.                        - Resume previous diet.                        - Continue present medications.                        - Resume Plavix (clopidogrel) at prior dose today.                         Refer to managing physician for further adjustment of                         therapy.                        - Repeat colonoscopy is not recommended due to current                         age (17 years or older) for surveillance.                        -  No repeat colonoscopy given age and comorbidities                         (next would be indicated in 10 years as she has no                         history of advanced polyps and two consecutive normal                         colonoscopies). No further fit/cologuard/fobt for                         screening                        - Return to GI office as previously scheduled.                        - The findings and recommendations were discussed with                         the patient. Procedure Code(s):     --- Professional ---                        [phone], Colonoscopy, flexible; diagnostic, including                         collection of specimen(s) by brushing or washing, when                         performed (separate procedure) Diagnosis Code(s):     --- Professional ---                        Z86.010, Personal history of colonic polyps                        K64.0, First degree hemorrhoids CPT copyright 2022 American Medical Association. All rights reserved. The codes documented in this report are preliminary and upon coder review may  be revised to meet current compliance requirements. Attending Participation:      I personally performed the entire procedure. Volney American, DO Annamaria Helling DO, DO 04/29/2022 9:47:17 AM This report has been signed electronically. Number of Addenda: 0 Note Initiated On: 04/29/2022 8:59 AM Scope Withdrawal  Time: 0 hours 14 minutes 28 seconds  Total Procedure Duration: 0 hours 19 minutes 55 seconds  Estimated Blood Loss:  Estimated blood loss: none.      [facility]

## 2022-04-29 NOTE — Transfer of Care (Signed)
Immediate Anesthesia Transfer of Care Note  Patient: Christine Zimmerman  Procedure(s) Performed: COLONOSCOPY  Patient Location: PACU and Endoscopy Unit  Anesthesia Type:General  Level of Consciousness: drowsy  Airway & Oxygen Therapy: Patient Spontanous Breathing  Post-op Assessment: Report given to RN and Post -op Vital signs reviewed and stable  Post vital signs: Reviewed and stable  Last Vitals:  Vitals Value Taken Time  BP 105/57 04/29/22 0941  Temp    Pulse 59 04/29/22 0941  Resp 14 04/29/22 0941  SpO2 100 % 04/29/22 0941    Last Pain:  Vitals:   04/29/22 0940  TempSrc:   PainSc: Asleep         Complications: No notable events documented.

## 2022-04-29 NOTE — Anesthesia Preprocedure Evaluation (Addendum)
Anesthesia Evaluation  Patient identified by MRN, date of birth, ID band Patient awake    Reviewed: Allergy & Precautions, H&P , NPO status , Patient's Chart, lab work & pertinent test results, reviewed documented beta blocker date and time   Airway Mallampati: III   Neck ROM: full    Dental  (+) Poor Dentition   Pulmonary neg pulmonary ROS   Pulmonary exam normal        Cardiovascular Exercise Tolerance: Poor hypertension, On Medications + CAD and +CHF  (-) Orthopnea + dysrhythmias Atrial Fibrillation + pacemaker  Rhythm:regular Rate:Normal  SSS seen by Paras[facility]/ [facility] Sept 2023   Neuro/Psych TIA negative psych ROS   GI/Hepatic Neg liver ROS,GERD  Medicated,,  Endo/Other  diabetesHypothyroidism    Renal/GU negative Renal ROS  negative genitourinary   Musculoskeletal   Abdominal   Peds  Hematology  (+) Blood dyscrasia, anemia   Anesthesia Other Findings Past Medical History: No date: Atrial fibrillation (HCC) No date: CHF (congestive heart failure) (HCC) No date: Chronic pain No date: Coronary artery disease No date: Diabetes mellitus without complication (HCC) No date: GERD (gastroesophageal reflux disease) No date: High cholesterol No date: Hypertension No date: Hypothyroidism Past Surgical History: No date: BACK SURGERY 10/23/2015: CARDIAC CATHETERIZATION; Left     Comment:  Procedure: Left Heart Cath and Coronary Angiography;                Surgeon: Alwyn Pea, MD;  Location: [facility] INVASIVE               CV LAB;  Service: Cardiovascular;  Laterality: Left; 01/03/2017: COLONOSCOPY WITH PROPOFOL; N/A     Comment:  Procedure: COLONOSCOPY WITH PROPOFOL;  Surgeon: Scot Jun, MD;  Location: [facility] ENDOSCOPY;  Service:               Endoscopy;  Laterality: N/A; 04/12/2022: COLONOSCOPY WITH PROPOFOL; N/A     Comment:  Procedure: COLONOSCOPY WITH PROPOFOL;  Surgeon: Jaynie Collins, DO;  Location: [facility] ENDOSCOPY;  Service:               Gastroenterology;  Laterality: N/A; No date: CORONARY STENT PLACEMENT     Comment:  x 4 at [facility]. x 1 by [place] in 2004 No date: INSERT / REPLACE / REMOVE PACEMAKER 06/08/2017: LEFT HEART CATH AND CORONARY ANGIOGRAPHY; N/A     Comment:  Procedure: LEFT HEART CATH AND CORONARY ANGIOGRAPHY;                Surgeon: Alwyn Pea, MD;  Location: [facility] INVASIVE              CV LAB;  Service: Cardiovascular;  Laterality: N/A; 11/03/2021: PACEMAKER IMPLANT; N/A     Comment:  Procedure: PACEMAKER IMPLANT;  Surgeon: Marcina Millard, MD;  Location: [facility] INVASIVE CV LAB;  Service:              Cardiovascular;  Laterality: N/A;   Reproductive/Obstetrics negative OB ROS                             Anesthesia Physical Anesthesia Plan  ASA: 3  Anesthesia Plan: General   Post-op Pain Management:    Induction:  PONV Risk Score and Plan:   Airway Management Planned:   Additional Equipment:   Intra-op Plan:   Post-operative Plan:   Informed Consent: I have reviewed the patients History and Physical, chart, labs and discussed the procedure including the risks, benefits and alternatives for the proposed anesthesia with the patient or authorized representative who has indicated his/her understanding and acceptance.     Dental Advisory Given  Plan Discussed with: CRNA  Anesthesia Plan Comments:        Anesthesia Quick Evaluation

## 2022-04-29 NOTE — H&P (Signed)
Pre-Procedure H&P   Patient ID: Christine Zimmerman is a 76 y.o. female.  Gastroenterology Provider: Annamaria Helling, DO  Referring Provider: Dawson Bills, NP PCP: Tracie Harrier, MD  Date: 04/29/2022  HPI Ms. Christine Zimmerman is a 75 y.o. female who presents today for Colonoscopy for Abdominal pain, personal history of polyps .  Plavix has been held since 04/23/22.  Patient with intermittent abdominal pain in the lower quadrants ongoing for approximately 1 year.  She will occasionally With nausea in the morning without vomiting.  A CT was performed in May of this year without any acute findings.  She reports twice a day bowel movements without melena or hematochezia.  She notices increase in pain while she is on her iron supplementation.  Hemoglobin is 13.6 MCV 89 platelets 405,000 creatinine 0.6 B12 242 in setting of pernicious anemia  Last colonoscopy August 2018 normal aside from internal hemorrhoids.  2 tubular adenomas noted in 2009 colonoscopy.   Past Medical History:  Diagnosis Date   Atrial fibrillation (HCC)    CHF (congestive heart failure) (HCC)    Chronic pain    Coronary artery disease    Diabetes mellitus without complication (HCC)    GERD (gastroesophageal reflux disease)    High cholesterol    Hypertension    Hypothyroidism    Presence of permanent cardiac pacemaker     Past Surgical History:  Procedure Laterality Date   BACK SURGERY     CARDIAC CATHETERIZATION Left 10/23/2015   Procedure: Left Heart Cath and Coronary Angiography;  Surgeon: Yolonda Kida, MD;  Location: [place] [facility];  Service: Cardiovascular;  Laterality: Left;   COLONOSCOPY WITH PROPOFOL N/A 01/03/2017   Procedure: COLONOSCOPY WITH PROPOFOL;  Surgeon: Manya Silvas, MD;  Location: [facility] ENDOSCOPY;  Service: Endoscopy;  Laterality: N/A;   COLONOSCOPY WITH PROPOFOL N/A 04/12/2022   Procedure: COLONOSCOPY WITH PROPOFOL;  Surgeon: Annamaria Helling, DO;  Location: [facility]  ENDOSCOPY;  Service: Gastroenterology;  Laterality: N/A;   CORONARY STENT PLACEMENT     x 4 at [facility]. x 1 by [place] in 2004   INSERT / REPLACE / REMOVE PACEMAKER     LEFT HEART CATH AND CORONARY ANGIOGRAPHY N/A 06/08/2017   Procedure: LEFT HEART CATH AND CORONARY ANGIOGRAPHY;  Surgeon: Yolonda Kida, MD;  Location: [place] [facility];  Service: Cardiovascular;  Laterality: N/A;   PACEMAKER IMPLANT N/A 11/03/2021   Procedure: PACEMAKER IMPLANT;  Surgeon: Isaias Cowman, MD;  Location: [place] [facility];  Service: Cardiovascular;  Laterality: N/A;    Family History No h/o GI disease or malignancy  Review of Systems  Constitutional:  Negative for activity change, appetite change, chills, diaphoresis, fatigue, fever and unexpected weight change.  HENT:  Negative for trouble swallowing and voice change.   Respiratory:  Negative for shortness of breath and wheezing.   Cardiovascular:  Negative for chest pain, palpitations and leg swelling.  Gastrointestinal:  Positive for abdominal pain. Negative for abdominal distention, anal bleeding, blood in stool, constipation, diarrhea, nausea, rectal pain and vomiting.  Musculoskeletal:  Negative for arthralgias and myalgias.  Skin:  Negative for color change and pallor.  Neurological:  Negative for dizziness, syncope and weakness.  Psychiatric/Behavioral:  Negative for confusion.   All other systems reviewed and are negative.    Medications No current facility-administered medications on file prior to encounter.   Current Outpatient Medications on File Prior to Encounter  Medication Sig Dispense Refill   carvedilol (COREG) 6.25  MG tablet Take 1 tablet (6.25 mg total) by mouth 2 (two) times daily with a meal. 60 tablet 1   DULoxetine (CYMBALTA) 20 MG capsule Take 20 mg by mouth daily.     ferrous sulfate 325 (65 FE) MG tablet Take 1 tablet by mouth 2 (two) times daily with a meal.      furosemide (LASIX) 20 MG tablet Take 20 mg  by mouth daily as needed for edema.     levothyroxine (SYNTHROID) 88 MCG tablet Take 88 mcg by mouth daily before breakfast.     pregabalin (LYRICA) 100 MG capsule Take 100 mg by mouth 3 (three) times daily.     clopidogrel (PLAVIX) 75 MG tablet Take 75 mg by mouth daily.     diclofenac sodium (VOLTAREN) 1 % GEL Apply 2 g topically 4 (four) times daily as needed.     glipiZIDE (GLUCOTROL XL) 5 MG 24 hr tablet Take 1 tablet (5 mg total) by mouth 2 (two) times daily. 30 tablet 0   isosorbide mononitrate (IMDUR) 30 MG 24 hr tablet Take 30 mg by mouth daily.      meloxicam (MOBIC) 7.5 MG tablet Take 7.5 mg by mouth daily.     metFORMIN (GLUCOPHAGE) 1000 MG tablet Take 1,000 mg by mouth daily with breakfast.      niacin 500 MG tablet Take 500 mg by mouth 2 (two) times daily with a meal.     nitroGLYCERIN (NITROLINGUAL) 0.4 MG/SPRAY spray Place 1 spray under the tongue every 5 (five) minutes x 3 doses as needed for chest pain.     pantoprazole (PROTONIX) 40 MG tablet Take 40 mg by mouth every evening.  (Patient not taking: Reported on 11/03/2021)     potassium chloride (K-DUR,KLOR-CON) 10 MEQ tablet Take 10 mEq by mouth daily as needed (with furosemide). (Patient not taking: Reported on 11/03/2021)      Pertinent medications related to GI and procedure were reviewed by me with the patient prior to the procedure   Current Facility-Administered Medications:    0.9 %  sodium chloride infusion, , Intravenous, Continuous, Jaynie Collins, DO, Last Rate: 20 mL/hr at 04/29/22 0827, New Bag at 04/29/22 0827  sodium chloride 20 mL/hr at 04/29/22 0827       Allergies  Allergen Reactions   Biaxin [Clarithromycin] Hives   Farxiga [Dapagliflozin]    Homatropine Other (See Comments)    Hycodan   Januvia [Sitagliptin] Other (See Comments)    Unknown   Macrodantin [Nitrofurantoin Macrocrystal] Other (See Comments)    Unknown   Naproxen Other (See Comments)    Unknown   Propoxyphene Other (See  Comments)    Darvocet   Tramadol Other (See Comments)    Unknown   Penicillins Rash and Other (See Comments)    Has patient had a PCN reaction causing immediate rash, facial/tongue/throat swelling, SOB or lightheadedness with hypotension: No Has patient had a PCN reaction causing severe rash involving mucus membranes or skin necrosis: No Has patient had a PCN reaction that required hospitalization: Yes Has patient had a PCN reaction occurring within the last 10 years: Unknown If all of the above answers are "NO", then may proceed with Cephalosporin use.    Allergies were reviewed by me prior to the procedure  Objective   Body mass index is 27.56 kg/m. Vitals:   04/29/22 0804  BP: (!) 148/57  Pulse: (!) 55  Resp: 16  Temp: (!) 96.8 F (36 C)  TempSrc: Temporal  SpO2: 99%  Weight: 64 kg     Physical Exam Vitals and nursing note reviewed.  Constitutional:      General: She is not in acute distress.    Appearance: Normal appearance. She is not ill-appearing, toxic-appearing or diaphoretic.  HENT:     Head: Normocephalic and atraumatic.     Nose: Nose normal.     Mouth/Throat:     Mouth: Mucous membranes are moist.     Pharynx: Oropharynx is clear.  Eyes:     General: No scleral icterus.    Extraocular Movements: Extraocular movements intact.  Cardiovascular:     Rate and Rhythm: Regular rhythm. Bradycardia present.     Heart sounds: Normal heart sounds. No murmur heard.    No friction rub. No gallop.     Comments: Ppm left chest wall Pulmonary:     Effort: Pulmonary effort is normal. No respiratory distress.     Breath sounds: Normal breath sounds. No wheezing, rhonchi or rales.  Abdominal:     General: Abdomen is flat. Bowel sounds are normal. There is no distension.     Palpations: Abdomen is soft.     Tenderness: There is no abdominal tenderness. There is no guarding or rebound.  Musculoskeletal:     Cervical back: Neck supple.     Right lower leg: No edema.      Left lower leg: No edema.  Skin:    General: Skin is warm and dry.     Coloration: Skin is not jaundiced or pale.  Neurological:     General: No focal deficit present.     Mental Status: She is alert and oriented to person, place, and time. Mental status is at baseline.  Psychiatric:        Mood and Affect: Mood normal.        Behavior: Behavior normal.        Thought Content: Thought content normal.        Judgment: Judgment normal.      Assessment:  Christine Zimmerman is a 76 y.o. female  who presents today for Colonoscopy for Abdominal pain, personal history of polyps .  Plan:  Colonoscopy with possible intervention today  Colonoscopy with possible biopsy, control of bleeding, polypectomy, and interventions as necessary has been discussed with the patient/patient representative. Informed consent was obtained from the patient/patient representative after explaining the indication, nature, and risks of the procedure including but not limited to death, bleeding, perforation, missed neoplasm/lesions, cardiorespiratory compromise, and reaction to medications. Opportunity for questions was given and appropriate answers were provided. Patient/patient representative has verbalized understanding is amenable to undergoing the procedure.   Annamaria Helling, DO  [facility]  Portions of the record may have been created with voice recognition software. Occasional wrong-word or 'sound-a-like' substitutions may have occurred due to the inherent limitations of voice recognition software.  Read the chart carefully and recognize, using context, where substitutions may have occurred.

## 2022-04-30 ENCOUNTER — Encounter: Payer: Self-pay | Admitting: Gastroenterology

## 2022-05-03 DIAGNOSIS — M6281 Muscle weakness (generalized): Secondary | ICD-10-CM | POA: Diagnosis not present

## 2022-05-03 DIAGNOSIS — M25562 Pain in left knee: Secondary | ICD-10-CM | POA: Diagnosis not present

## 2022-05-03 DIAGNOSIS — G8929 Other chronic pain: Secondary | ICD-10-CM | POA: Diagnosis not present

## 2022-05-03 DIAGNOSIS — M25561 Pain in right knee: Secondary | ICD-10-CM | POA: Diagnosis not present

## 2022-05-12 DIAGNOSIS — M25561 Pain in right knee: Secondary | ICD-10-CM | POA: Diagnosis not present

## 2022-05-12 DIAGNOSIS — G8929 Other chronic pain: Secondary | ICD-10-CM | POA: Diagnosis not present

## 2022-05-12 DIAGNOSIS — M25562 Pain in left knee: Secondary | ICD-10-CM | POA: Diagnosis not present

## 2022-05-12 DIAGNOSIS — M6281 Muscle weakness (generalized): Secondary | ICD-10-CM | POA: Diagnosis not present

## 2022-05-13 DIAGNOSIS — E113293 Type 2 diabetes mellitus with mild nonproliferative diabetic retinopathy without macular edema, bilateral: Secondary | ICD-10-CM | POA: Diagnosis not present

## 2022-05-13 DIAGNOSIS — Z0100 Encounter for examination of eyes and vision without abnormal findings: Secondary | ICD-10-CM | POA: Diagnosis not present

## 2022-05-19 DIAGNOSIS — M25562 Pain in left knee: Secondary | ICD-10-CM | POA: Diagnosis not present

## 2022-05-19 DIAGNOSIS — M25561 Pain in right knee: Secondary | ICD-10-CM | POA: Diagnosis not present

## 2022-05-19 DIAGNOSIS — M6281 Muscle weakness (generalized): Secondary | ICD-10-CM | POA: Diagnosis not present

## 2022-05-19 DIAGNOSIS — G8929 Other chronic pain: Secondary | ICD-10-CM | POA: Diagnosis not present

## 2022-05-20 DIAGNOSIS — H524 Presbyopia: Secondary | ICD-10-CM | POA: Diagnosis not present

## 2022-05-27 DIAGNOSIS — G8929 Other chronic pain: Secondary | ICD-10-CM | POA: Diagnosis not present

## 2022-05-27 DIAGNOSIS — M6281 Muscle weakness (generalized): Secondary | ICD-10-CM | POA: Diagnosis not present

## 2022-05-27 DIAGNOSIS — M25562 Pain in left knee: Secondary | ICD-10-CM | POA: Diagnosis not present

## 2022-05-27 DIAGNOSIS — M25561 Pain in right knee: Secondary | ICD-10-CM | POA: Diagnosis not present

## 2022-06-04 DIAGNOSIS — I10 Essential (primary) hypertension: Secondary | ICD-10-CM | POA: Diagnosis not present

## 2022-06-04 DIAGNOSIS — E039 Hypothyroidism, unspecified: Secondary | ICD-10-CM | POA: Diagnosis not present

## 2022-06-04 DIAGNOSIS — E538 Deficiency of other specified B group vitamins: Secondary | ICD-10-CM | POA: Diagnosis not present

## 2022-06-04 DIAGNOSIS — R7989 Other specified abnormal findings of blood chemistry: Secondary | ICD-10-CM | POA: Diagnosis not present

## 2022-06-04 DIAGNOSIS — E1165 Type 2 diabetes mellitus with hyperglycemia: Secondary | ICD-10-CM | POA: Diagnosis not present

## 2022-06-14 DIAGNOSIS — E1140 Type 2 diabetes mellitus with diabetic neuropathy, unspecified: Secondary | ICD-10-CM | POA: Diagnosis not present

## 2022-06-14 DIAGNOSIS — M7672 Peroneal tendinitis, left leg: Secondary | ICD-10-CM | POA: Diagnosis not present

## 2022-06-14 DIAGNOSIS — M25561 Pain in right knee: Secondary | ICD-10-CM | POA: Diagnosis not present

## 2022-06-14 DIAGNOSIS — M6281 Muscle weakness (generalized): Secondary | ICD-10-CM | POA: Diagnosis not present

## 2022-06-14 DIAGNOSIS — M25562 Pain in left knee: Secondary | ICD-10-CM | POA: Diagnosis not present

## 2022-06-14 DIAGNOSIS — E538 Deficiency of other specified B group vitamins: Secondary | ICD-10-CM | POA: Diagnosis not present

## 2022-06-14 DIAGNOSIS — L851 Acquired keratosis [keratoderma] palmaris et plantaris: Secondary | ICD-10-CM | POA: Diagnosis not present

## 2022-06-14 DIAGNOSIS — M7752 Other enthesopathy of left foot: Secondary | ICD-10-CM | POA: Diagnosis not present

## 2022-06-14 DIAGNOSIS — B351 Tinea unguium: Secondary | ICD-10-CM | POA: Diagnosis not present

## 2022-06-14 DIAGNOSIS — G8929 Other chronic pain: Secondary | ICD-10-CM | POA: Diagnosis not present

## 2022-06-23 DIAGNOSIS — I2510 Atherosclerotic heart disease of native coronary artery without angina pectoris: Secondary | ICD-10-CM | POA: Diagnosis not present

## 2022-06-23 DIAGNOSIS — R7989 Other specified abnormal findings of blood chemistry: Secondary | ICD-10-CM | POA: Diagnosis not present

## 2022-06-23 DIAGNOSIS — E1140 Type 2 diabetes mellitus with diabetic neuropathy, unspecified: Secondary | ICD-10-CM | POA: Diagnosis not present

## 2022-06-23 DIAGNOSIS — R262 Difficulty in walking, not elsewhere classified: Secondary | ICD-10-CM | POA: Diagnosis not present

## 2022-06-23 DIAGNOSIS — M25511 Pain in right shoulder: Secondary | ICD-10-CM | POA: Diagnosis not present

## 2022-06-23 DIAGNOSIS — E538 Deficiency of other specified B group vitamins: Secondary | ICD-10-CM | POA: Diagnosis not present

## 2022-06-23 DIAGNOSIS — I5030 Unspecified diastolic (congestive) heart failure: Secondary | ICD-10-CM | POA: Diagnosis not present

## 2022-06-23 DIAGNOSIS — I110 Hypertensive heart disease with heart failure: Secondary | ICD-10-CM | POA: Diagnosis not present

## 2022-07-05 DIAGNOSIS — R29898 Other symptoms and signs involving the musculoskeletal system: Secondary | ICD-10-CM | POA: Diagnosis not present

## 2022-07-05 DIAGNOSIS — Z950 Presence of cardiac pacemaker: Secondary | ICD-10-CM | POA: Diagnosis not present

## 2022-07-05 DIAGNOSIS — I495 Sick sinus syndrome: Secondary | ICD-10-CM | POA: Diagnosis not present

## 2022-07-05 DIAGNOSIS — I25118 Atherosclerotic heart disease of native coronary artery with other forms of angina pectoris: Secondary | ICD-10-CM | POA: Diagnosis not present

## 2022-07-05 DIAGNOSIS — E119 Type 2 diabetes mellitus without complications: Secondary | ICD-10-CM | POA: Diagnosis not present

## 2022-07-05 DIAGNOSIS — Z955 Presence of coronary angioplasty implant and graft: Secondary | ICD-10-CM | POA: Diagnosis not present

## 2022-07-05 DIAGNOSIS — I10 Essential (primary) hypertension: Secondary | ICD-10-CM | POA: Diagnosis not present

## 2022-07-05 DIAGNOSIS — R600 Localized edema: Secondary | ICD-10-CM | POA: Diagnosis not present

## 2022-07-05 DIAGNOSIS — E782 Mixed hyperlipidemia: Secondary | ICD-10-CM | POA: Diagnosis not present

## 2022-07-13 DIAGNOSIS — E7800 Pure hypercholesterolemia, unspecified: Secondary | ICD-10-CM | POA: Diagnosis not present

## 2022-07-13 DIAGNOSIS — I2510 Atherosclerotic heart disease of native coronary artery without angina pectoris: Secondary | ICD-10-CM | POA: Diagnosis not present

## 2022-07-13 DIAGNOSIS — E1165 Type 2 diabetes mellitus with hyperglycemia: Secondary | ICD-10-CM | POA: Diagnosis not present

## 2022-07-13 DIAGNOSIS — I10 Essential (primary) hypertension: Secondary | ICD-10-CM | POA: Diagnosis not present

## 2022-07-13 DIAGNOSIS — E1159 Type 2 diabetes mellitus with other circulatory complications: Secondary | ICD-10-CM | POA: Diagnosis not present

## 2022-07-13 DIAGNOSIS — M47812 Spondylosis without myelopathy or radiculopathy, cervical region: Secondary | ICD-10-CM | POA: Diagnosis not present

## 2022-07-15 DIAGNOSIS — E1140 Type 2 diabetes mellitus with diabetic neuropathy, unspecified: Secondary | ICD-10-CM | POA: Diagnosis not present

## 2022-07-15 DIAGNOSIS — I5030 Unspecified diastolic (congestive) heart failure: Secondary | ICD-10-CM | POA: Diagnosis not present

## 2022-07-15 DIAGNOSIS — I110 Hypertensive heart disease with heart failure: Secondary | ICD-10-CM | POA: Diagnosis not present

## 2022-07-15 DIAGNOSIS — E538 Deficiency of other specified B group vitamins: Secondary | ICD-10-CM | POA: Diagnosis not present

## 2022-07-15 DIAGNOSIS — I2510 Atherosclerotic heart disease of native coronary artery without angina pectoris: Secondary | ICD-10-CM | POA: Diagnosis not present

## 2022-07-15 DIAGNOSIS — R200 Anesthesia of skin: Secondary | ICD-10-CM | POA: Diagnosis not present

## 2022-07-15 DIAGNOSIS — R7989 Other specified abnormal findings of blood chemistry: Secondary | ICD-10-CM | POA: Diagnosis not present

## 2022-07-15 DIAGNOSIS — R262 Difficulty in walking, not elsewhere classified: Secondary | ICD-10-CM | POA: Diagnosis not present

## 2022-07-15 DIAGNOSIS — R0989 Other specified symptoms and signs involving the circulatory and respiratory systems: Secondary | ICD-10-CM | POA: Diagnosis not present

## 2022-07-28 DIAGNOSIS — I495 Sick sinus syndrome: Secondary | ICD-10-CM | POA: Diagnosis not present

## 2022-08-12 DIAGNOSIS — M25561 Pain in right knee: Secondary | ICD-10-CM | POA: Diagnosis not present

## 2022-08-12 DIAGNOSIS — M180 Bilateral primary osteoarthritis of first carpometacarpal joints: Secondary | ICD-10-CM | POA: Diagnosis not present

## 2022-08-12 DIAGNOSIS — M170 Bilateral primary osteoarthritis of knee: Secondary | ICD-10-CM | POA: Diagnosis not present

## 2022-08-12 DIAGNOSIS — G8929 Other chronic pain: Secondary | ICD-10-CM | POA: Diagnosis not present

## 2022-09-15 DIAGNOSIS — B351 Tinea unguium: Secondary | ICD-10-CM | POA: Diagnosis not present

## 2022-09-15 DIAGNOSIS — E1140 Type 2 diabetes mellitus with diabetic neuropathy, unspecified: Secondary | ICD-10-CM | POA: Diagnosis not present

## 2022-09-15 DIAGNOSIS — M7732 Calcaneal spur, left foot: Secondary | ICD-10-CM | POA: Diagnosis not present

## 2022-09-15 DIAGNOSIS — M722 Plantar fascial fibromatosis: Secondary | ICD-10-CM | POA: Diagnosis not present

## 2022-09-15 DIAGNOSIS — L851 Acquired keratosis [keratoderma] palmaris et plantaris: Secondary | ICD-10-CM | POA: Diagnosis not present

## 2022-09-20 DIAGNOSIS — E538 Deficiency of other specified B group vitamins: Secondary | ICD-10-CM | POA: Diagnosis not present

## 2022-09-20 DIAGNOSIS — E1165 Type 2 diabetes mellitus with hyperglycemia: Secondary | ICD-10-CM | POA: Diagnosis not present

## 2022-09-20 DIAGNOSIS — I10 Essential (primary) hypertension: Secondary | ICD-10-CM | POA: Diagnosis not present

## 2022-09-20 DIAGNOSIS — I2510 Atherosclerotic heart disease of native coronary artery without angina pectoris: Secondary | ICD-10-CM | POA: Diagnosis not present

## 2022-09-20 DIAGNOSIS — R7989 Other specified abnormal findings of blood chemistry: Secondary | ICD-10-CM | POA: Diagnosis not present

## 2022-09-20 DIAGNOSIS — R262 Difficulty in walking, not elsewhere classified: Secondary | ICD-10-CM | POA: Diagnosis not present

## 2022-09-20 DIAGNOSIS — M778 Other enthesopathies, not elsewhere classified: Secondary | ICD-10-CM | POA: Diagnosis not present

## 2022-09-20 DIAGNOSIS — M159 Polyosteoarthritis, unspecified: Secondary | ICD-10-CM | POA: Diagnosis not present

## 2022-09-27 DIAGNOSIS — M25572 Pain in left ankle and joints of left foot: Secondary | ICD-10-CM | POA: Diagnosis not present

## 2022-09-27 DIAGNOSIS — R262 Difficulty in walking, not elsewhere classified: Secondary | ICD-10-CM | POA: Diagnosis not present

## 2022-09-27 DIAGNOSIS — E1140 Type 2 diabetes mellitus with diabetic neuropathy, unspecified: Secondary | ICD-10-CM | POA: Diagnosis not present

## 2022-09-27 DIAGNOSIS — E039 Hypothyroidism, unspecified: Secondary | ICD-10-CM | POA: Diagnosis not present

## 2022-09-27 DIAGNOSIS — Z0000 Encounter for general adult medical examination without abnormal findings: Secondary | ICD-10-CM | POA: Diagnosis not present

## 2022-09-27 DIAGNOSIS — M25561 Pain in right knee: Secondary | ICD-10-CM | POA: Diagnosis not present

## 2022-09-27 DIAGNOSIS — I5030 Unspecified diastolic (congestive) heart failure: Secondary | ICD-10-CM | POA: Diagnosis not present

## 2022-09-27 DIAGNOSIS — I110 Hypertensive heart disease with heart failure: Secondary | ICD-10-CM | POA: Diagnosis not present

## 2022-09-27 DIAGNOSIS — Z1331 Encounter for screening for depression: Secondary | ICD-10-CM | POA: Diagnosis not present

## 2022-09-28 DIAGNOSIS — R0989 Other specified symptoms and signs involving the circulatory and respiratory systems: Secondary | ICD-10-CM | POA: Diagnosis not present

## 2022-09-28 DIAGNOSIS — I6523 Occlusion and stenosis of bilateral carotid arteries: Secondary | ICD-10-CM | POA: Diagnosis not present

## 2022-09-29 DIAGNOSIS — M797 Fibromyalgia: Secondary | ICD-10-CM | POA: Diagnosis not present

## 2022-09-29 DIAGNOSIS — R252 Cramp and spasm: Secondary | ICD-10-CM | POA: Diagnosis not present

## 2022-11-02 DIAGNOSIS — R600 Localized edema: Secondary | ICD-10-CM | POA: Diagnosis not present

## 2022-11-02 DIAGNOSIS — R262 Difficulty in walking, not elsewhere classified: Secondary | ICD-10-CM | POA: Diagnosis not present

## 2022-11-02 DIAGNOSIS — E1140 Type 2 diabetes mellitus with diabetic neuropathy, unspecified: Secondary | ICD-10-CM | POA: Diagnosis not present

## 2022-11-02 DIAGNOSIS — M79675 Pain in left toe(s): Secondary | ICD-10-CM | POA: Diagnosis not present

## 2022-11-02 DIAGNOSIS — I110 Hypertensive heart disease with heart failure: Secondary | ICD-10-CM | POA: Diagnosis not present

## 2022-11-02 DIAGNOSIS — G8929 Other chronic pain: Secondary | ICD-10-CM | POA: Diagnosis not present

## 2022-11-02 DIAGNOSIS — M25572 Pain in left ankle and joints of left foot: Secondary | ICD-10-CM | POA: Diagnosis not present

## 2022-11-02 DIAGNOSIS — I5030 Unspecified diastolic (congestive) heart failure: Secondary | ICD-10-CM | POA: Diagnosis not present

## 2022-11-04 ENCOUNTER — Other Ambulatory Visit: Payer: Self-pay | Admitting: Internal Medicine

## 2022-11-04 DIAGNOSIS — R600 Localized edema: Secondary | ICD-10-CM

## 2022-11-04 DIAGNOSIS — G8929 Other chronic pain: Secondary | ICD-10-CM

## 2022-11-25 ENCOUNTER — Ambulatory Visit
Admission: RE | Admit: 2022-11-25 | Discharge: 2022-11-25 | Disposition: A | Discharge status: Home / Self Care | Payer: Medicare HMO | Source: Ambulatory Visit | Attending: Internal Medicine | Admitting: Internal Medicine

## 2022-11-25 DIAGNOSIS — G8929 Other chronic pain: Secondary | ICD-10-CM | POA: Insufficient documentation

## 2022-11-25 DIAGNOSIS — R600 Localized edema: Secondary | ICD-10-CM | POA: Diagnosis not present

## 2022-11-25 DIAGNOSIS — M25572 Pain in left ankle and joints of left foot: Secondary | ICD-10-CM | POA: Diagnosis not present

## 2022-11-25 DIAGNOSIS — M79675 Pain in left toe(s): Secondary | ICD-10-CM | POA: Diagnosis not present

## 2022-12-13 DIAGNOSIS — M79604 Pain in right leg: Secondary | ICD-10-CM | POA: Diagnosis not present

## 2022-12-13 DIAGNOSIS — M25552 Pain in left hip: Secondary | ICD-10-CM | POA: Diagnosis not present

## 2022-12-13 DIAGNOSIS — M4807 Spinal stenosis, lumbosacral region: Secondary | ICD-10-CM | POA: Diagnosis not present

## 2022-12-13 DIAGNOSIS — M9933 Osseous stenosis of neural canal of lumbar region: Secondary | ICD-10-CM | POA: Diagnosis not present

## 2022-12-13 DIAGNOSIS — M79605 Pain in left leg: Secondary | ICD-10-CM | POA: Diagnosis not present

## 2022-12-14 ENCOUNTER — Other Ambulatory Visit: Payer: Self-pay | Admitting: Orthopedic Surgery

## 2022-12-14 DIAGNOSIS — M48062 Spinal stenosis, lumbar region with neurogenic claudication: Secondary | ICD-10-CM

## 2022-12-29 DIAGNOSIS — I495 Sick sinus syndrome: Secondary | ICD-10-CM | POA: Diagnosis not present

## 2023-01-03 DIAGNOSIS — R200 Anesthesia of skin: Secondary | ICD-10-CM | POA: Diagnosis not present

## 2023-01-03 DIAGNOSIS — I495 Sick sinus syndrome: Secondary | ICD-10-CM | POA: Diagnosis not present

## 2023-01-03 DIAGNOSIS — B351 Tinea unguium: Secondary | ICD-10-CM | POA: Diagnosis not present

## 2023-01-03 DIAGNOSIS — Z950 Presence of cardiac pacemaker: Secondary | ICD-10-CM | POA: Diagnosis not present

## 2023-01-03 DIAGNOSIS — M778 Other enthesopathies, not elsewhere classified: Secondary | ICD-10-CM | POA: Diagnosis not present

## 2023-01-03 DIAGNOSIS — I25118 Atherosclerotic heart disease of native coronary artery with other forms of angina pectoris: Secondary | ICD-10-CM | POA: Diagnosis not present

## 2023-01-03 DIAGNOSIS — E782 Mixed hyperlipidemia: Secondary | ICD-10-CM | POA: Diagnosis not present

## 2023-01-03 DIAGNOSIS — E119 Type 2 diabetes mellitus without complications: Secondary | ICD-10-CM | POA: Diagnosis not present

## 2023-01-03 DIAGNOSIS — E1140 Type 2 diabetes mellitus with diabetic neuropathy, unspecified: Secondary | ICD-10-CM | POA: Diagnosis not present

## 2023-01-03 DIAGNOSIS — I10 Essential (primary) hypertension: Secondary | ICD-10-CM | POA: Diagnosis not present

## 2023-01-03 DIAGNOSIS — Z955 Presence of coronary angioplasty implant and graft: Secondary | ICD-10-CM | POA: Diagnosis not present

## 2023-01-03 DIAGNOSIS — R600 Localized edema: Secondary | ICD-10-CM | POA: Diagnosis not present

## 2023-02-11 DIAGNOSIS — G5792 Unspecified mononeuropathy of left lower limb: Secondary | ICD-10-CM | POA: Diagnosis not present

## 2023-02-11 DIAGNOSIS — M898X9 Other specified disorders of bone, unspecified site: Secondary | ICD-10-CM | POA: Diagnosis not present

## 2023-02-11 DIAGNOSIS — G5752 Tarsal tunnel syndrome, left lower limb: Secondary | ICD-10-CM | POA: Diagnosis not present

## 2023-02-11 DIAGNOSIS — M778 Other enthesopathies, not elsewhere classified: Secondary | ICD-10-CM | POA: Diagnosis not present

## 2023-02-11 DIAGNOSIS — M79672 Pain in left foot: Secondary | ICD-10-CM | POA: Diagnosis not present

## 2023-02-11 DIAGNOSIS — E1140 Type 2 diabetes mellitus with diabetic neuropathy, unspecified: Secondary | ICD-10-CM | POA: Diagnosis not present

## 2023-02-14 DIAGNOSIS — E039 Hypothyroidism, unspecified: Secondary | ICD-10-CM | POA: Diagnosis not present

## 2023-02-14 DIAGNOSIS — M79675 Pain in left toe(s): Secondary | ICD-10-CM | POA: Diagnosis not present

## 2023-02-14 DIAGNOSIS — Z6830 Body mass index (BMI) 30.0-30.9, adult: Secondary | ICD-10-CM | POA: Diagnosis not present

## 2023-02-14 DIAGNOSIS — Z23 Encounter for immunization: Secondary | ICD-10-CM | POA: Diagnosis not present

## 2023-02-14 DIAGNOSIS — G8929 Other chronic pain: Secondary | ICD-10-CM | POA: Diagnosis not present

## 2023-02-14 DIAGNOSIS — I495 Sick sinus syndrome: Secondary | ICD-10-CM | POA: Diagnosis not present

## 2023-02-14 DIAGNOSIS — M79605 Pain in left leg: Secondary | ICD-10-CM | POA: Diagnosis not present

## 2023-02-14 DIAGNOSIS — R200 Anesthesia of skin: Secondary | ICD-10-CM | POA: Diagnosis not present

## 2023-02-14 DIAGNOSIS — R262 Difficulty in walking, not elsewhere classified: Secondary | ICD-10-CM | POA: Diagnosis not present

## 2023-02-14 DIAGNOSIS — R7309 Other abnormal glucose: Secondary | ICD-10-CM | POA: Diagnosis not present

## 2023-02-14 DIAGNOSIS — F3341 Major depressive disorder, recurrent, in partial remission: Secondary | ICD-10-CM | POA: Diagnosis not present

## 2023-02-14 DIAGNOSIS — M25561 Pain in right knee: Secondary | ICD-10-CM | POA: Diagnosis not present

## 2023-02-16 DIAGNOSIS — M797 Fibromyalgia: Secondary | ICD-10-CM | POA: Diagnosis not present

## 2023-02-16 DIAGNOSIS — G5603 Carpal tunnel syndrome, bilateral upper limbs: Secondary | ICD-10-CM | POA: Diagnosis not present

## 2023-02-16 DIAGNOSIS — M79645 Pain in left finger(s): Secondary | ICD-10-CM | POA: Diagnosis not present

## 2023-02-22 DIAGNOSIS — M1711 Unilateral primary osteoarthritis, right knee: Secondary | ICD-10-CM | POA: Diagnosis not present

## 2023-02-22 DIAGNOSIS — M170 Bilateral primary osteoarthritis of knee: Secondary | ICD-10-CM | POA: Diagnosis not present

## 2023-02-22 DIAGNOSIS — M180 Bilateral primary osteoarthritis of first carpometacarpal joints: Secondary | ICD-10-CM | POA: Diagnosis not present

## 2023-03-03 ENCOUNTER — Encounter: Payer: Self-pay | Admitting: Occupational Therapy

## 2023-03-03 ENCOUNTER — Ambulatory Visit: Payer: Medicare HMO | Attending: Neurology | Admitting: Occupational Therapy

## 2023-03-03 DIAGNOSIS — M25531 Pain in right wrist: Secondary | ICD-10-CM | POA: Diagnosis not present

## 2023-03-03 DIAGNOSIS — M79642 Pain in left hand: Secondary | ICD-10-CM | POA: Insufficient documentation

## 2023-03-03 DIAGNOSIS — M79641 Pain in right hand: Secondary | ICD-10-CM | POA: Insufficient documentation

## 2023-03-03 DIAGNOSIS — M6281 Muscle weakness (generalized): Secondary | ICD-10-CM | POA: Diagnosis not present

## 2023-03-03 DIAGNOSIS — M25641 Stiffness of right hand, not elsewhere classified: Secondary | ICD-10-CM | POA: Diagnosis not present

## 2023-03-03 DIAGNOSIS — M25631 Stiffness of right wrist, not elsewhere classified: Secondary | ICD-10-CM | POA: Insufficient documentation

## 2023-03-03 NOTE — Therapy (Signed)
OUTPATIENT OCCUPATIONAL THERAPY ORTHO EVALUATION  Patient Name: Christine Zimmerman MRN: [id number] DOB:[birth year], 77 y.o., female Today's Date: 03/03/2023  PCP: Dr Marcello Fennel REFERRING PROVIDER: Dr Sherryll Burger  END OF SESSION:  OT End of Session - 03/03/23 1543     Visit Number 1    Number of Visits 10    Date for OT Re-Evaluation 04/14/23    OT Start Time 1015    OT Stop Time 1125    OT Time Calculation (min) 70 min    Activity Tolerance Patient tolerated treatment well    Behavior During Therapy WFL for tasks assessed/performed             Past Medical History:  Diagnosis Date   Atrial fibrillation (HCC)    CHF (congestive heart failure) (HCC)    Chronic pain    Coronary artery disease    Diabetes mellitus without complication (HCC)    GERD (gastroesophageal reflux disease)    High cholesterol    Hypertension    Hypothyroidism    Presence of permanent cardiac pacemaker    Past Surgical History:  Procedure Laterality Date   BACK SURGERY     CARDIAC CATHETERIZATION Left 10/23/2015   Procedure: Left Heart Cath and Coronary Angiography;  Surgeon: Alwyn Pea, MD;  Location: [facility] INVASIVE CV [facility];  Service: Cardiovascular;  Laterality: Left;   COLONOSCOPY N/A 04/29/2022   Procedure: COLONOSCOPY;  Surgeon: Jaynie Collins, DO;  Location: [facility] ENDOSCOPY;  Service: Gastroenterology;  Laterality: N/A;   COLONOSCOPY WITH PROPOFOL N/A 01/03/2017   Procedure: COLONOSCOPY WITH PROPOFOL;  Surgeon: Scot Jun, MD;  Location: [facility] ENDOSCOPY;  Service: Endoscopy;  Laterality: N/A;   COLONOSCOPY WITH PROPOFOL N/A 04/12/2022   Procedure: COLONOSCOPY WITH PROPOFOL;  Surgeon: Jaynie Collins, DO;  Location: [facility] ENDOSCOPY;  Service: Gastroenterology;  Laterality: N/A;   CORONARY STENT PLACEMENT     x 4 at [facility]. x 1 by [facility] in 2004   INSERT / REPLACE / REMOVE PACEMAKER     LEFT HEART CATH AND CORONARY ANGIOGRAPHY N/A 06/08/2017   Procedure: LEFT HEART CATH AND  CORONARY ANGIOGRAPHY;  Surgeon: Alwyn Pea, MD;  Location: [facility] INVASIVE [facility];  Service: Cardiovascular;  Laterality: N/A;   PACEMAKER IMPLANT N/A 11/03/2021   Procedure: PACEMAKER IMPLANT;  Surgeon: Marcina Millard, MD;  Location: [facility] INV[facility];  Service: Cardiovascular;  Laterality: N/A;   Patient Active Problem List   Diagnosis Date Noted   Sick sinus syndrome (HCC) 11/03/2021   Weakness generalized 05/09/2019   Chronic diastolic heart failure (HCC) 03/23/2018   HTN (hypertension) 03/23/2018   DM (diabetes mellitus) (HCC) 03/23/2018   SOB (shortness of breath) 02/01/2018   Dizziness 08/01/2017   TIA (transient ischemic attack) 10/17/2015   Hyponatremia 10/17/2015   Anemia 10/17/2015   Chest pain 10/16/2015   Left-sided weakness 10/16/2015   Hypotension 10/16/2015   Bradycardia 10/16/2015   Hypoglycemia 10/16/2015    ONSET DATE: 3-6 months ago  REFERRING DIAG: Bilateral hand pain, CTS and thumb CMC OA  THERAPY DIAG:  Pain in both hands  Pain in right wrist  Stiffness of right wrist, not elsewhere classified  Stiffness of right hand, not elsewhere classified  Muscle weakness (generalized)  Rationale for Evaluation and Treatment: Rehabilitation  SUBJECTIVE:   SUBJECTIVE STATEMENT: Both my hands is hurting.  The right is now worse than the left.  The got worse after had been using a walker for the last 3 months because of my knee and  foot.  It hurts with everything I do like cooking and laundry and dressing and bathing Pt accompanied by: family member  PERTINENT HISTORY: 02/16/23 Dr Sherryll Burger note: 2. Pain in left CMC joint in patient with history of carpal tunnel syndrome - Wearing therapeutic gloves and night only - Is not interested in more medications   We will order occupational hand therapy with [facility]  If the patient does not receive a call to schedule their initial physical therapy appointment, please use the information below to schedule  an appointment directly.  [facility] Address: [address] [place], [place], [place] [address] Phone: [phone]  3. Concern for polypharmacy-  Try cutting back on your pantoprazole  4. Pain in left first carpal metacarpal phalangeal joint and bilateral knees likely secondary to osteoarthritis   5. Low back pain with radiating pain down through bilateral lower extremity   6. 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 Bilateral carpal tunnel syndrome status post carpal tunnel injections  8. 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 Wellness Keep up the healthy eating! We recommend a diet consisting of a variety of green leafy vegetables, fruits, nuts and other whole grain food. Avoid ultra-processed food, soda and artifical sweeteners.  - Eats tumeric on a regular basis   PRECAUTIONS: None    WEIGHT BEARING RESTRICTIONS: No  PAIN:  Are you having pain? 6/10 pain R thumb and radial wrist , L 5/10 thumb CMC   FALLS: Has patient fallen in last 6 months? No  LIVING ENVIRONMENT: Lives with-Spouse and son that is disabled   PATIENT GOALS: I want the pain better in my hands so that I can do things around the house  NEXT MD VISIT: 6 months  OBJECTIVE:  Note: Objective measures were completed at Evaluation unless otherwise noted.  HAND DOMINANCE: Right   UPPER EXTREMITY ROM:     Active ROM Right eval Left eval  Shoulder flexion    Shoulder abduction    Shoulder adduction    Shoulder extension    Shoulder internal rotation    Shoulder external rotation    Elbow flexion    Elbow extension    Wrist flexion 35 pain radial wrist  75 pain volar wrist   Wrist extension 30 pain  radial wrist 70  Wrist ulnar deviation 12 pain radial wrist  30  Wrist radial deviation 15 20  Wrist pronation 90 90  Wrist supination 80 pain end range  90  (Blank rows = not tested)  Active ROM Right eval Left eval  Thumb MCP (0-60)    Thumb IP (0-80)    Thumb Radial abd/add (0-55) 45  45  Thumb Palmar abd/add (0-45) 42 55   Thumb Opposition to Small Finger To 5th  To 5th    Index MCP (0-90)     Index PIP (0-100)     Index DIP (0-70)      Long MCP (0-90)      Long PIP (0-100)      Long DIP (0-70)      Ring MCP (0-90)      Ring PIP (0-100)      Ring DIP (0-70)      Little MCP (0-90)  Little PIP (0-100)      Little DIP (0-70)      (Blank rows = not tested)  HAND FUNCTION: Grip strength: Right: 25 lbs; Left: 25 lbs, Lateral pinch: Right: 8 lbs, Left: 8 lbs, and 3 point pinch: Right: 7 lbs, Left: 6 lbs ( pain at 3 point volar wrist)  COORDINATION: Limited mostly by pain in thumbs and radial wrist R   SENSATION: Denies numbness   EDEMA: none notice  COGNITION: Overall cognitive status: Within functional limits for tasks assessed   OBSERVATIONS: Increase tightness in right webspace limiting palmar radial abduction with increased strain on abductor and extensors of thumb.    TODAY'S TREATMENT:                                                                                                                              DATE: 03/03/23:  Paraffin done to bilateral hands prior to review for soft tissue massage by husband for bilateral hand webspace 10 reps as well as metacarpal sprites 10 reps Prior to active range of motion for palmar radial abduction. Reviewed with patient active assisted range of motion for radial ulnar deviation 10 reps And active range of motion for wrist flexion extension. Home exercises of bilateral hands. Stop when feeling a pull or pain.  Patient to do moist heat or warm water twice a day followed by soft tissue massage.  Patient report does  have neoprene thumb soft splints.  Patient reported to the pharmacy.  Patient to bring it in next time. Patient to wear during the day with activities that causes pain. Reviewed with patient some modifications on enlarging grips and using larger joints. [place] review on modifications and adaptive equipment and adaptations.   PATIENT EDUCATION: Education details: findings of eval and HEP  Person educated: Patient and Spouse Education method: Explanation, Demonstration, Tactile cues, Verbal cues, and Handouts Education comprehension: verbalized understanding, returned demonstration, and needs further education    GOALS: Goals reviewed with patient? Yes LONG TERM GOALS: Target date: 6  wks   Patient to be independent in a HEP to decrease pain in bilateral hands  thumb and radial wrist to less than 2/10 to increase thumb  and wrist active range of motion in all planes Baseline: Thumb palmar abduction  R 42, L 55 and radial abduction  R 45, L 45 - stiffness in webpace - pain R 6/10 and L 5/10 - R wrist flexion 35 and ext 30  Goal status: INITIAL   2.  AROM in R and L thumb palmar/ radial abduction improved for patient to turn doorknob and use walker pain less than 2/10  Baseline: pain R 6/10 and L 5/10 - decrease thumb PA and RA - R worse than L - see goal 1  Goal status: INITIAL   3.  Wrist AROM and strength increased to [facility] for patient to push and pull door open, use walker  bath and dress using bilateral  hands with symptoms less than 2/10 Baseline: Right wrist flexion extension  30-35  with pain 6/10 pain .  Pain L thumb and radial wrist 5/10  Goal status: INITIAL   4.  Grip and prehension strength increased to without range for her age to use hands with symptoms less than 2/10 in ADL"S  Baseline: Right grip 25 pounds and left 25  pounds, lateral pinch right 8 pounds and left 8 pounds and 3-point pinch right 7 pound and left 6  pounds.  - pain 5-6/10 pain -  Goal status:  INITIAL   5: Pt to verbalize and demo 3 modifications, adaptive equipment or joint protection to implement at home to decrease pain to less than 2/10 in bilateral hands.  BASELINE: Patient no knowledge of adaptations or modifications.  Pain increased 5-6/10 in bilateral hands GOAL STATUS: Initial  ASSESSMENT:  CLINICAL IMPRESSION: Patient seen today for occupational therapy evaluation for bilateral hand pain with a diagnosis of carpal tunnel symptoms as well as CMC thumb OA.  Patient present with pain 6/10 in the right thumb CMC as well as radial wrist over first dorsal compartment.  Patient with increased tightness and tenderness over right webspace with limited thumb palmar radial abduction as well as wrist flexion and extension.  Left wrist active range of motion within functional limits as well as thumb palmar abduction.  Limited left thumb radial abduction.  Reports 5/10 pain in left thumb.  Appear patient had increased symptoms for more than 6 months to a year but increased in the last 3 months after using a walker because of knee and foot pain.  Patient with tenderness over bilateral CMC thumb joints as well as during prehension strength thumb CMC dropping into carpal tunnel.  As well as stiffness and tightness in right thumb webspace causing strain over extensors and abductors of thumb.  Patient with decreased grip and prehension strength in bilateral hands patient can benefit from skilled OT services to decrease pain as well as increase motion and strength to increase independence in functional use of bilateral hands.  Patient in need of education for modifications and joint protection as well as adaptive equipment to decrease strain on hands in ADLs and IADLs.  PERFORMANCE DEFICITS: in functional skills including ADLs, IADLs, ROM, strength, pain, flexibility, decreased knowledge of use of DME, and UE functional use,   and psychosocial skills including environmental adaptation and routines and  behaviors.   IMPAIRMENTS: are limiting patient from ADLs, IADLs, rest and sleep, play, leisure, and social participation.   COMORBIDITIES: has no other co-morbidities that affects occupational performance. Patient will benefit from skilled OT to address above impairments and improve overall function.  MODIFICATION OR ASSISTANCE TO COMPLETE EVALUATION: several modification of tasks or assist necessary to complete an evaluation.  OT OCCUPATIONAL PROFILE AND HISTORY: Problem focused assessment: Including review of records relating to presenting problem.  CLINICAL DECISION MAKING: Medium treatment options,  task modification necessary to decrease pain   REHAB POTENTIAL: Good for goals  EVALUATION COMPLEXITY: Medium   PLAN:  OT FREQUENCY: 1 -2 x/week  OT DURATION: 6 weeks PLANNING: Splinting;Paraffin;Fluidtherapy;Contrast Bath;DME and/or AE instruction;Manual Therapy;Passive range of motion;Scar mobilization;Therapeutic activities, Ultrasound, Iontophoresis, There ex, pt education       Oletta Cohn, OTR/L,CLT 03/03/2023, 3:45 PM

## 2023-03-07 ENCOUNTER — Ambulatory Visit: Payer: Medicare HMO | Admitting: Occupational Therapy

## 2023-03-07 DIAGNOSIS — M6281 Muscle weakness (generalized): Secondary | ICD-10-CM

## 2023-03-07 DIAGNOSIS — M79641 Pain in right hand: Secondary | ICD-10-CM | POA: Diagnosis not present

## 2023-03-07 DIAGNOSIS — M25631 Stiffness of right wrist, not elsewhere classified: Secondary | ICD-10-CM

## 2023-03-07 DIAGNOSIS — M79642 Pain in left hand: Secondary | ICD-10-CM | POA: Diagnosis not present

## 2023-03-07 DIAGNOSIS — M25641 Stiffness of right hand, not elsewhere classified: Secondary | ICD-10-CM

## 2023-03-07 DIAGNOSIS — M25531 Pain in right wrist: Secondary | ICD-10-CM | POA: Diagnosis not present

## 2023-03-07 NOTE — Therapy (Signed)
OUTPATIENT OCCUPATIONAL THERAPY ORTHO TREATMENT  Patient Name: Christine Zimmerman MRN: [id number] DOB:06-18-1945, 77 y.o., female Today's Date: 03/07/2023  PCP: Dr Marcello Fennel REFERRING PROVIDER: Dr Sherryll Burger  END OF SESSION:  OT End of Session - 03/07/23 0933     Visit Number 2    Number of Visits 10    Date for OT Re-Evaluation 04/14/23    OT Start Time 0817    OT Stop Time 0910    OT Time Calculation (min) 53 min    Activity Tolerance Patient tolerated treatment well    Behavior During Therapy [facility] for tasks assessed/performed             Past Medical History:  Diagnosis Date   Atrial fibrillation (HCC)    CHF (congestive heart failure) (HCC)    Chronic pain    Coronary artery disease    Diabetes mellitus without complication (HCC)    GERD (gastroesophageal reflux disease)    High cholesterol    Hypertension    Hypothyroidism    Presence of permanent cardiac pacemaker    Past Surgical History:  Procedure Laterality Date   BACK SURGERY     CARDIAC CATHETERIZATION Left 10/23/2015   Procedure: Left Heart Cath and Coronary Angiography;  Surgeon: Alwyn Pea, MD;  Location: [facility] INVASIVE CV LAB;  Service: Cardiovascular;  Laterality: Left;   COLONOSCOPY N/A 04/29/2022   Procedure: COLONOSCOPY;  Surgeon: Jaynie Collins, DO;  Location: [facility] ENDOSCOPY;  Service: Gastroenterology;  Laterality: N/A;   COLONOSCOPY WITH PROPOFOL N/A 01/03/2017   Procedure: COLONOSCOPY WITH PROPOFOL;  Surgeon: Scot Jun, MD;  Location: [facility] ENDOSCOPY;  Service: Endoscopy;  Laterality: N/A;   COLONOSCOPY WITH PROPOFOL N/A 04/12/2022   Procedure: COLONOSCOPY WITH PROPOFOL;  Surgeon: Jaynie Collins, DO;  Location: [facility] ENDOSCOPY;  Service: Gastroenterology;  Laterality: N/A;   CORONARY STENT PLACEMENT     x 4 at [facility]. x 1 by [place] in 2004   INSERT / REPLACE / REMOVE PACEMAKER     LEFT HEART CATH AND CORONARY ANGIOGRAPHY N/A 06/08/2017   Procedure: LEFT HEART CATH AND  CORONARY ANGIOGRAPHY;  Surgeon: Alwyn Pea, MD;  Location: [facility] INVASIVE CV LAB;  Service: Cardiovascular;  Laterality: N/A;   PACEMAKER IMPLANT N/A 11/03/2021   Procedure: PACEMAKER IMPLANT;  Surgeon: Marcina Millard, MD;  Location: [facility] INVASIVE CV LAB;  Service: Cardiovascular;  Laterality: N/A;   Patient Active Problem List   Diagnosis Date Noted   Sick sinus syndrome (HCC) 11/03/2021   Weakness generalized 05/09/2019   Chronic diastolic heart failure (HCC) 03/23/2018   HTN (hypertension) 03/23/2018   DM (diabetes mellitus) (HCC) 03/23/2018   SOB (shortness of breath) 02/01/2018   Dizziness 08/01/2017   TIA (transient ischemic attack) 10/17/2015   Hyponatremia 10/17/2015   Anemia 10/17/2015   Chest pain 10/16/2015   Left-sided weakness 10/16/2015   Hypotension 10/16/2015   Bradycardia 10/16/2015   Hypoglycemia 10/16/2015    ONSET DATE: 3-6 months ago  REFERRING DIAG: Bilateral hand pain, CTS and thumb CMC OA  THERAPY DIAG:  Pain in both hands  Pain in right wrist  Stiffness of right wrist, not elsewhere classified  Muscle weakness (generalized)  Stiffness of right hand, not elsewhere classified  Rationale for Evaluation and Treatment: Rehabilitation  SUBJECTIVE:   SUBJECTIVE STATEMENT: I brought my L hand splint in for you to look- my husband did the massage-my hands still hurting- I use to do water exercise - that was prior to my fall about 2  yrs ago on my R knee- the Dr says I need surgery- use can or walker the last 3-6 months  Pt accompanied by: family member  PERTINENT HISTORY: 02/16/23 Dr Sherryll Burger note: 2. Pain in left CMC joint in patient with history of carpal tunnel syndrome - Wearing therapeutic gloves and night only - Is not interested in more medications   We will order occupational hand therapy with [place]  If the patient does not receive a call to schedule their initial physical therapy appointment, please use the information below to  schedule an appointment directly.  [facility] Address: [address] [place], [place], [place] [address] Phone: [phone]  3. Concern for polypharmacy-  Try cutting back on your pantoprazole  4. Pain in left first carpal metacarpal phalangeal joint and bilateral knees likely secondary to osteoarthritis   5. Low back pain with radiating pain down through bilateral lower extremity   6. 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 Bilateral carpal tunnel syndrome status post carpal tunnel injections  8. 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 Wellness Keep up the healthy eating! We recommend a diet consisting of a variety of green leafy vegetables, fruits, nuts and other whole grain food. Avoid ultra-processed food, soda and artifical sweeteners.  - Eats tumeric on a regular basis   PRECAUTIONS: None    WEIGHT BEARING RESTRICTIONS: No  PAIN:  Are you having pain? 5-6 /10 pain R thumb and radial wrist , L 5/10 thumb CMC   FALLS: Has patient fallen in last 6 months? No  LIVING ENVIRONMENT: Lives with-Spouse and son that is disabled   PATIENT GOALS: I want the pain better in my hands so that I can do things around the house  NEXT MD VISIT: 6 months  OBJECTIVE:  Note: Objective measures were completed at Evaluation unless otherwise noted.  HAND DOMINANCE: Right   UPPER EXTREMITY ROM:     Active ROM Right eval Left eval R/L 03/07/23  Shoulder flexion     Shoulder abduction     Shoulder adduction     Shoulder extension     Shoulder internal rotation     Shoulder external rotation     Elbow flexion     Elbow extension     Wrist flexion 35 pain radial wrist  75 pain volar  wrist  65/75  Wrist extension 30 pain radial wrist 70 70/70  Wrist ulnar deviation 12 pain radial wrist  30 24/30  Wrist radial deviation 15 20 16/20  Wrist pronation 90 90   Wrist supination 80 pain end range  90   (Blank rows = not tested)  Active ROM Right eval Left eval  Thumb MCP (0-60)    Thumb IP (0-80)    Thumb Radial abd/add (0-55) 45  45  Thumb Palmar abd/add (0-45) 42 55   Thumb Opposition to Small Finger To 5th  To 5th    Index MCP (0-90)     Index PIP (0-100)     Index DIP (0-70)      Long MCP (0-90)      Long PIP (0-100)      Long DIP (0-70)      Ring MCP (0-90)      Ring  PIP (0-100)      Ring DIP (0-70)      Little MCP (0-90)      Little PIP (0-100)      Little DIP (0-70)      (Blank rows = not tested)  HAND FUNCTION: Grip strength: Right: 25 lbs; Left: 25 lbs, Lateral pinch: Right: 8 lbs, Left: 8 lbs, and 3 point pinch: Right: 7 lbs, Left: 6 lbs ( pain at 3 point volar wrist)  COORDINATION: Limited mostly by pain in thumbs and radial wrist R   SENSATION: Denies numbness   EDEMA: none notice  COGNITION: Overall cognitive status: Within functional limits for tasks assessed   OBSERVATIONS: Increase tightness in right webspace limiting palmar radial abduction with increased strain on abductor and extensors of thumb. Tenderness in webspace     TODAY'S TREATMENT:                                                                                                                              DATE: 03/07/23:  Paraffin done to bilateral hands prior to  soft tissue massage and mobs by OT for trigger point release, and  in combination with AAROM PA and RA - 20 reps L and R  Review with pt and  husband to do at home for bilateral hand webspace 10 reps as well as metacarpal sprites 10 reps She has voltaren ointment -but not using it -remind pt to use again on thumb CMC and 1st dorsal compartment Modify HEP to AAROM for PA of bilateral thumbs- pain free range  And  cont with AROM radial abduction. Showed progress in session  Reviewed and done with patient active assisted range of motion for radial ulnar deviation 10 reps And AAROM and AROM bilateral wrist flexion / extension. Review with pt again - showed progress Home exercises of bilateral hands. Stop when feeling a pull or pain.  Patient to do moist heat or warm water twice a day followed by soft tissue massage.  Pt brought in her thumb spica  for OT to assess-   Fitted pt with bilateral CMC neoprene - medium - to support thumb - decrease pain but increase motion and  functional strength  To wear during day with activities that causes pain. Reviewed with patient again  some modifications on enlarging grips and using larger joints. [place] review on modifications and adaptive equipment and adaptations.   PATIENT EDUCATION: Education details: findings of eval and HEP  Person educated: Patient and Spouse Education method: Explanation, Demonstration, Tactile cues, Verbal cues, and Handouts Education comprehension: verbalized understanding, returned demonstration, and needs further education    GOALS: Goals reviewed with patient? Yes LONG TERM GOALS: Target date: 6  wks   Patient to be independent in a HEP to decrease pain in bilateral hands  thumb and radial wrist to less than 2/10 to increase thumb  and wrist active range of motion in all planes Baseline: Thumb palmar abduction  R 42, L 55 and radial abduction  R 45, L 45 - stiffness in webpace - pain R 6/10 and L 5/10 - R wrist flexion 35 and ext 30  Goal status: INITIAL   2.  AROM in R and L thumb palmar/ radial abduction improved for patient to turn doorknob and use walker pain less than 2/10  Baseline: pain R 6/10 and L 5/10 - decrease thumb PA and RA - R worse than L - see goal 1  Goal status: INITIAL   3.  Wrist AROM and strength increased to [facility] for patient to push and pull door open, use walker  bath and dress using bilateral hands  with symptoms less than 2/10 Baseline: Right wrist flexion extension  30-35  with pain 6/10 pain .  Pain L thumb and radial wrist 5/10  Goal status: INITIAL   4.  Grip and prehension strength increased to without range for her age to use hands with symptoms less than 2/10 in ADL"S  Baseline: Right grip 25 pounds and left 25  pounds, lateral pinch right 8 pounds and left 8 pounds and 3-point pinch right 7 pound and left 6  pounds.  - pain 5-6/10 pain -  Goal status: INITIAL   5: Pt to verbalize and demo 3 modifications, adaptive equipment or joint protection to implement at home to decrease pain to less than 2/10 in bilateral hands.  BASELINE: Patient no knowledge of adaptations or modifications.  Pain increased 5-6/10 in bilateral hands GOAL STATUS: Initial  ASSESSMENT:  CLINICAL IMPRESSION: Patient seen today for occupational therapy evaluation for bilateral hand pain with a diagnosis of carpal tunnel symptoms as well as CMC thumb OA.  Patient present with pain 6/10 in the right thumb CMC as well as radial wrist over first dorsal compartment.  Patient with increased tightness and tenderness over right webspace with limited thumb palmar radial abduction as well as wrist flexion and extension.  Left wrist active range of motion within functional limits as well as thumb palmar abduction.  Limited left thumb radial abduction.  Reports 5/10 pain in left thumb.  Appear patient had increased symptoms for more than 6 months to a year but increased in the last 3 months after using a walker because of knee and foot pain.  Pt cont to show increase pain, tenderness and stiffness in R more than L thumb PA and RA , wrist AROM - but improved since last session in R wrist AROM - fitted with bilateral CMC neoprene splints to use during day with activities - pt pain decrease to end of session - Pt can benefit from skilled OT services to decrease pain as well as increase motion and strength to increase independence  in functional use of bilateral hands.  Patient in need of education for modifications and joint protection as well as adaptive equipment to decrease strain on hands in ADLs and IADLs.  PERFORMANCE DEFICITS: in functional skills including ADLs, IADLs, ROM, strength, pain, flexibility, decreased knowledge of use of DME, and UE functional use,   and psychosocial skills including environmental adaptation and routines and behaviors.   IMPAIRMENTS: are limiting patient from ADLs, IADLs, rest and sleep, play, leisure, and social participation.   COMORBIDITIES: has no other co-morbidities that affects occupational performance. Patient will benefit from skilled OT to address above impairments and improve overall function.  MODIFICATION OR ASSISTANCE TO COMPLETE EVALUATION: several modification of tasks or assist necessary to complete an evaluation.  OT OCCUPATIONAL PROFILE AND HISTORY:  Problem focused assessment: Including review of records relating to presenting problem.  CLINICAL DECISION MAKING: Medium treatment options,  task modification necessary to decrease pain   REHAB POTENTIAL: Good for goals  EVALUATION COMPLEXITY: Medium   PLAN:  OT FREQUENCY: 1 -2 x/week  OT DURATION: 6 weeks PLANNING: Splinting;Paraffin;Fluidtherapy;Contrast Bath;DME and/or AE instruction;Manual Therapy;Passive range of motion;Scar mobilization;Therapeutic activities, Ultrasound, Iontophoresis, There ex, pt education       Oletta Cohn, OTR/L,CLT 03/07/2023, 9:34 AM

## 2023-03-08 ENCOUNTER — Other Ambulatory Visit: Payer: Self-pay | Admitting: Internal Medicine

## 2023-03-08 DIAGNOSIS — R262 Difficulty in walking, not elsewhere classified: Secondary | ICD-10-CM | POA: Diagnosis not present

## 2023-03-08 DIAGNOSIS — G8929 Other chronic pain: Secondary | ICD-10-CM | POA: Diagnosis not present

## 2023-03-08 DIAGNOSIS — M25561 Pain in right knee: Secondary | ICD-10-CM | POA: Diagnosis not present

## 2023-03-08 DIAGNOSIS — I10 Essential (primary) hypertension: Secondary | ICD-10-CM | POA: Diagnosis not present

## 2023-03-08 DIAGNOSIS — M79605 Pain in left leg: Secondary | ICD-10-CM | POA: Diagnosis not present

## 2023-03-08 DIAGNOSIS — Z0000 Encounter for general adult medical examination without abnormal findings: Secondary | ICD-10-CM | POA: Diagnosis not present

## 2023-03-08 DIAGNOSIS — I2510 Atherosclerotic heart disease of native coronary artery without angina pectoris: Secondary | ICD-10-CM | POA: Diagnosis not present

## 2023-03-08 DIAGNOSIS — M79604 Pain in right leg: Secondary | ICD-10-CM | POA: Diagnosis not present

## 2023-03-08 DIAGNOSIS — Z1231 Encounter for screening mammogram for malignant neoplasm of breast: Secondary | ICD-10-CM

## 2023-03-08 DIAGNOSIS — M25562 Pain in left knee: Secondary | ICD-10-CM | POA: Diagnosis not present

## 2023-03-10 ENCOUNTER — Ambulatory Visit: Payer: Medicare HMO | Admitting: Occupational Therapy

## 2023-03-10 DIAGNOSIS — M79642 Pain in left hand: Secondary | ICD-10-CM | POA: Diagnosis not present

## 2023-03-10 DIAGNOSIS — M25641 Stiffness of right hand, not elsewhere classified: Secondary | ICD-10-CM

## 2023-03-10 DIAGNOSIS — M6281 Muscle weakness (generalized): Secondary | ICD-10-CM

## 2023-03-10 DIAGNOSIS — M25631 Stiffness of right wrist, not elsewhere classified: Secondary | ICD-10-CM | POA: Diagnosis not present

## 2023-03-10 DIAGNOSIS — M25531 Pain in right wrist: Secondary | ICD-10-CM

## 2023-03-10 DIAGNOSIS — M79641 Pain in right hand: Secondary | ICD-10-CM | POA: Diagnosis not present

## 2023-03-10 NOTE — Therapy (Signed)
OUTPATIENT OCCUPATIONAL THERAPY ORTHO TREATMENT  Patient Name: Christine Zimmerman MRN: [id number] DOB:[birth year], 77 y.o., female Today's Date: 03/10/2023  PCP: Dr Marcello Fennel REFERRING PROVIDER: Dr Sherryll Burger  END OF SESSION:  OT End of Session - 03/10/23 1844     Visit Number 3    Number of Visits 10    Date for OT Re-Evaluation 04/14/23    OT Start Time 1405    OT Stop Time 1457    OT Time Calculation (min) 52 min    Activity Tolerance Patient tolerated treatment well    Behavior During Therapy WFL for tasks assessed/performed             Past Medical History:  Diagnosis Date   Atrial fibrillation (HCC)    CHF (congestive heart failure) (HCC)    Chronic pain    Coronary artery disease    Diabetes mellitus without complication (HCC)    GERD (gastroesophageal reflux disease)    High cholesterol    Hypertension    Hypothyroidism    Presence of permanent cardiac pacemaker    Past Surgical History:  Procedure Laterality Date   BACK SURGERY     CARDIAC CATHETERIZATION Left 10/23/2015   Procedure: Left Heart Cath and Coronary Angiography;  Surgeon: Alwyn Pea, MD;  Location: [facility] INVASIVE CV LAB;  Service: Cardiovascular;  Laterality: Left;   COLONOSCOPY N/A 04/29/2022   Procedure: COLONOSCOPY;  Surgeon: Jaynie Collins, DO;  Location: [facility] ENDOSCOPY;  Service: Gastroenterology;  Laterality: N/A;   COLONOSCOPY WITH PROPOFOL N/A 01/03/2017   Procedure: COLONOSCOPY WITH PROPOFOL;  Surgeon: Scot Jun, MD;  Location: [facility] ENDOSCOPY;  Service: Endoscopy;  Laterality: N/A;   COLONOSCOPY WITH PROPOFOL N/A 04/12/2022   Procedure: COLONOSCOPY WITH PROPOFOL;  Surgeon: Jaynie Collins, DO;  Location: [facility] ENDOSCOPY;  Service: Gastroenterology;  Laterality: N/A;   CORONARY STENT PLACEMENT     x 4 at [facility]. x 1 by [place] in 2004   INSERT / REPLACE / REMOVE PACEMAKER     LEFT HEART CATH AND CORONARY ANGIOGRAPHY N/A 06/08/2017   Procedure: LEFT HEART CATH AND  CORONARY ANGIOGRAPHY;  Surgeon: Alwyn Pea, MD;  Location: [facility] INVASIVE CV LAB;  Service: Cardiovascular;  Laterality: N/A;   PACEMAKER IMPLANT N/A 11/03/2021   Procedure: PACEMAKER IMPLANT;  Surgeon: Marcina Millard, MD;  Location: [facility] INVASIVE CV LAB;  Service: Cardiovascular;  Laterality: N/A;   Patient Active Problem List   Diagnosis Date Noted   Sick sinus syndrome (HCC) 11/03/2021   Weakness generalized 05/09/2019   Chronic diastolic heart failure (HCC) 03/23/2018   HTN (hypertension) 03/23/2018   DM (diabetes mellitus) (HCC) 03/23/2018   SOB (shortness of breath) 02/01/2018   Dizziness 08/01/2017   TIA (transient ischemic attack) 10/17/2015   Hyponatremia 10/17/2015   Anemia 10/17/2015   Chest pain 10/16/2015   Left-sided weakness 10/16/2015   Hypotension 10/16/2015   Bradycardia 10/16/2015   Hypoglycemia 10/16/2015    ONSET DATE: 3-6 months ago  REFERRING DIAG: Bilateral hand pain, CTS and thumb CMC OA  THERAPY DIAG:  Pain in both hands  Pain in right wrist  Stiffness of right wrist, not elsewhere classified  Muscle weakness (generalized)  Stiffness of right hand, not elsewhere classified  Rationale for Evaluation and Treatment: Rehabilitation  SUBJECTIVE:   SUBJECTIVE STATEMENT: Mild right wrist is really painful.  I have been doing things around the house.  I went to my family doctor today and asked for PT order for my knee.  I think  he says have a Baker's cyst.   Pt accompanied by: family member  PERTINENT HISTORY: 02/16/23 Dr Sherryll Burger note: 2. Pain in left CMC joint in patient with history of carpal tunnel syndrome - Wearing therapeutic gloves and night only - Is not interested in more medications   We will order occupational hand therapy with [place]  If the patient does not receive a call to schedule their initial physical therapy appointment, please use the information below to schedule an appointment directly.  [facility] Address: [address] [place], [place], [place] [address] Phone: [phone]  3. Concern for polypharmacy-  Try cutting back on your pantoprazole  4. Pain in left first carpal metacarpal phalangeal joint and bilateral knees likely secondary to osteoarthritis   5. Low back pain with radiating pain down through bilateral lower extremity   6. 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 Bilateral carpal tunnel syndrome status post carpal tunnel injections  8. 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 Wellness Keep up the healthy eating! We recommend a diet consisting of a variety of green leafy vegetables, fruits, nuts and other whole grain food. Avoid ultra-processed food, soda and artifical sweeteners.  - Eats tumeric on a regular basis   PRECAUTIONS: None    WEIGHT BEARING RESTRICTIONS: No  PAIN:  Are you having pain?  8/10 pain R first dorsal compartment tenderness positive Finkelstein ;left for/10  FALLS: Has patient fallen in last 6 months? No  LIVING ENVIRONMENT: Lives with-Spouse and son that is disabled   PATIENT GOALS: I want the pain better in my hands so that I can do things around the house  NEXT MD VISIT: 6 months  OBJECTIVE:  Note: Objective measures were completed at Evaluation unless otherwise noted.  HAND DOMINANCE: Right   UPPER EXTREMITY ROM:     Active ROM Right eval Left eval R/L 03/07/23  Shoulder flexion     Shoulder abduction     Shoulder adduction     Shoulder extension     Shoulder internal rotation     Shoulder external rotation     Elbow flexion     Elbow extension     Wrist flexion 35 pain radial wrist  75 pain volar wrist  65/75  Wrist extension 30  pain radial wrist 70 70/70  Wrist ulnar deviation 12 pain radial wrist  30 24/30  Wrist radial deviation 15 20 16/20  Wrist pronation 90 90   Wrist supination 80 pain end range  90   (Blank rows = not tested)  Active ROM Right eval Left eval  Thumb MCP (0-60)    Thumb IP (0-80)    Thumb Radial abd/add (0-55) 45  45  Thumb Palmar abd/add (0-45) 42 55   Thumb Opposition to Small Finger To 5th  To 5th    Index MCP (0-90)     Index PIP (0-100)     Index DIP (0-70)      Long MCP (0-90)      Long PIP (0-100)      Long DIP (0-70)      Ring MCP (0-90)      Ring PIP (0-100)      Ring DIP (0-70)  Little MCP (0-90)      Little PIP (0-100)      Little DIP (0-70)      (Blank rows = not tested)  HAND FUNCTION: Grip strength: Right: 25 lbs; Left: 25 lbs, Lateral pinch: Right: 8 lbs, Left: 8 lbs, and 3 point pinch: Right: 7 lbs, Left: 6 lbs ( pain at 3 point volar wrist)  COORDINATION: Limited mostly by pain in thumbs and radial wrist R   SENSATION: Denies numbness   EDEMA: none notice  COGNITION: Overall cognitive status: Within functional limits for tasks assessed   OBSERVATIONS: Increase tightness in right webspace limiting palmar radial abduction with increased strain on abductor and extensors of thumb. Tenderness in webspace     TODAY'S TREATMENT:                                                                                                                              DATE: 03/10/23:  Patient arrive with bilateral CMC neoprene splints on bilateral hands.  Using cane.  Depending heavily on single-point cane.  Patient report increased pain over the right first dorsal compartment.  Tenderness as well as pain with wrist flexion and ulnar deviation as well as thumb radial abduction Left same symptoms but less pain.  Patient fitted with a prefab thumb spica on the right to use as much as he can.  Done the state contrast prior to soft tissue massage and mobs by OT for  trigger point release and thumb webspace with metacarpal sprites and reviewed with husband again.  Husband is left-handed.    She has voltaren ointment -but not using it -remind pt to use again on thumb CMC and 1st dorsal compartment-  Patient to hold off on any home exercise program Patient educated in contrast at home  Iontophoresis with dexamethasone with medium patch to bilateral first dorsal compartment done.  Patient tolerating a 1.0-0.8 current -stop to 20 minutes  Skin check done prior and afterwards.   Reviewed with patient again  some modifications on enlarging grips and using larger joints. [place] review on modifications and adaptive equipment and adaptations.   PATIENT EDUCATION: Education details: findings of eval and HEP  Person educated: Patient and Spouse Education method: Explanation, Demonstration, Tactile cues, Verbal cues, and Handouts Education comprehension: verbalized understanding, returned demonstration, and needs further education    GOALS: Goals reviewed with patient? Yes LONG TERM GOALS: Target date: 6  wks   Patient to be independent in a HEP to decrease pain in bilateral hands  thumb and radial wrist to less than 2/10 to increase thumb  and wrist active range of motion in all planes Baseline: Thumb palmar abduction  R 42, L 55 and radial abduction  R 45, L 45 - stiffness in webpace - pain R 6/10 and L 5/10 - R wrist flexion 35 and ext 30  Goal status: INITIAL   2.  AROM in R and L thumb palmar/ radial abduction  improved for patient to turn doorknob and use walker pain less than 2/10  Baseline: pain R 6/10 and L 5/10 - decrease thumb PA and RA - R worse than L - see goal 1  Goal status: INITIAL   3.  Wrist AROM and strength increased to [facility] for patient to push and pull door open, use walker  bath and dress using bilateral hands with symptoms less than 2/10 Baseline: Right wrist flexion extension  30-35  with pain 6/10 pain .  Pain L thumb and radial  wrist 5/10  Goal status: INITIAL   4.  Grip and prehension strength increased to without range for her age to use hands with symptoms less than 2/10 in ADL"S  Baseline: Right grip 25 pounds and left 25  pounds, lateral pinch right 8 pounds and left 8 pounds and 3-point pinch right 7 pound and left 6  pounds.  - pain 5-6/10 pain -  Goal status: INITIAL   5: Pt to verbalize and demo 3 modifications, adaptive equipment or joint protection to implement at home to decrease pain to less than 2/10 in bilateral hands.  BASELINE: Patient no knowledge of adaptations or modifications.  Pain increased 5-6/10 in bilateral hands GOAL STATUS: Initial  ASSESSMENT:  CLINICAL IMPRESSION: Patient seen today for occupational therapy evaluation for bilateral hand pain with a diagnosis of carpal tunnel symptoms as well as CMC thumb OA.  Patient present with pain 6/10 in the right thumb CMC as well as radial wrist over first dorsal compartment.  Patient with increased tightness and tenderness over right webspace with limited thumb palmar radial abduction as well as wrist flexion and extension.  Left wrist active range of motion within functional limits as well as thumb palmar abduction.  Limited left thumb radial abduction.  Reports 5/10 pain in left thumb.  Appear patient had increased symptoms for more than 6 months to a year but increased in the last 3 months after using a walker because of knee and foot pain.  Patient this date arrives with increased pain and tenderness over first dorsal compartment on the right with increased pain with wrist flexion and radial deviation with thumb radial abduction.  Right worse than left.  Patient fitted with a prefab thumb spica on the right and initiated ionto with dexamethasone.- Pt can benefit from skilled OT services to decrease pain as well as increase motion and strength to increase independence in functional use of bilateral hands.  Patient in need of education for  modifications and joint protection as well as adaptive equipment to decrease strain on hands in ADLs and IADLs.  PERFORMANCE DEFICITS: in functional skills including ADLs, IADLs, ROM, strength, pain, flexibility, decreased knowledge of use of DME, and UE functional use,   and psychosocial skills including environmental adaptation and routines and behaviors.   IMPAIRMENTS: are limiting patient from ADLs, IADLs, rest and sleep, play, leisure, and social participation.   COMORBIDITIES: has no other co-morbidities that affects occupational performance. Patient will benefit from skilled OT to address above impairments and improve overall function.  MODIFICATION OR ASSISTANCE TO COMPLETE EVALUATION: several modification of tasks or assist necessary to complete an evaluation.  OT OCCUPATIONAL PROFILE AND HISTORY: Problem focused assessment: Including review of records relating to presenting problem.  CLINICAL DECISION MAKING: Medium treatment options,  task modification necessary to decrease pain   REHAB POTENTIAL: Good for goals  EVALUATION COMPLEXITY: Medium   PLAN:  OT FREQUENCY: 1 -2 x/week  OT DURATION: 6 weeks PLANNING:  Splinting;Paraffin;Fluidtherapy;Contrast Bath;DME and/or AE instruction;Manual Therapy;Passive range of motion;Scar mobilization;Therapeutic activities, Ultrasound, Iontophoresis, There ex, pt education       Oletta Cohn, OTR/L,CLT 03/10/2023, 6:46 PM

## 2023-03-17 ENCOUNTER — Ambulatory Visit: Payer: Medicare HMO | Admitting: Occupational Therapy

## 2023-04-04 ENCOUNTER — Telehealth: Payer: Self-pay | Admitting: Physical Therapy

## 2023-04-04 ENCOUNTER — Ambulatory Visit: Payer: Medicare HMO | Attending: Internal Medicine | Admitting: Physical Therapy

## 2023-04-04 NOTE — Telephone Encounter (Signed)
Called pt to check in about absence from PT and need to reschedule. Pt did not answer so left VM instructing pt to do so.

## 2023-04-06 ENCOUNTER — Ambulatory Visit: Payer: Medicare HMO | Admitting: Physical Therapy

## 2023-04-12 ENCOUNTER — Encounter: Payer: Medicare HMO | Admitting: Physical Therapy

## 2023-04-18 ENCOUNTER — Encounter: Payer: Medicare HMO | Admitting: Physical Therapy

## 2023-04-20 ENCOUNTER — Encounter: Payer: Medicare HMO | Admitting: Physical Therapy

## 2023-04-25 ENCOUNTER — Encounter: Payer: Medicare HMO | Admitting: Physical Therapy

## 2023-04-26 DIAGNOSIS — I495 Sick sinus syndrome: Secondary | ICD-10-CM | POA: Diagnosis not present

## 2023-04-26 DIAGNOSIS — Z950 Presence of cardiac pacemaker: Secondary | ICD-10-CM | POA: Diagnosis not present

## 2023-05-02 ENCOUNTER — Ambulatory Visit: Payer: Medicare HMO | Attending: Internal Medicine | Admitting: Occupational Therapy

## 2023-05-02 DIAGNOSIS — M25641 Stiffness of right hand, not elsewhere classified: Secondary | ICD-10-CM | POA: Insufficient documentation

## 2023-05-02 DIAGNOSIS — M25531 Pain in right wrist: Secondary | ICD-10-CM | POA: Diagnosis not present

## 2023-05-02 DIAGNOSIS — R2689 Other abnormalities of gait and mobility: Secondary | ICD-10-CM | POA: Diagnosis not present

## 2023-05-02 DIAGNOSIS — M79642 Pain in left hand: Secondary | ICD-10-CM | POA: Diagnosis not present

## 2023-05-02 DIAGNOSIS — M79641 Pain in right hand: Secondary | ICD-10-CM | POA: Insufficient documentation

## 2023-05-02 DIAGNOSIS — R262 Difficulty in walking, not elsewhere classified: Secondary | ICD-10-CM | POA: Diagnosis not present

## 2023-05-02 DIAGNOSIS — M6281 Muscle weakness (generalized): Secondary | ICD-10-CM | POA: Insufficient documentation

## 2023-05-02 DIAGNOSIS — M25631 Stiffness of right wrist, not elsewhere classified: Secondary | ICD-10-CM | POA: Diagnosis not present

## 2023-05-04 ENCOUNTER — Ambulatory Visit: Payer: Medicare HMO | Admitting: Physical Therapy

## 2023-05-04 ENCOUNTER — Encounter: Payer: Medicare HMO | Admitting: Physical Therapy

## 2023-05-04 NOTE — Therapy (Addendum)
OUTPATIENT OCCUPATIONAL THERAPY ORTHO TREATMENT/RECERTIFICATION  Patient Name: Christine Zimmerman MRN: [id number] DOB:10/13/1945, 77 y.o., female  PCP: Dr Dion Saucier PROVIDER: Dr Sherryll Burger  END OF SESSION:  OT End of Session - 05/04/23 1845     Visit Number 4    Number of Visits 10    Date for OT Re-Evaluation 06/13/23    OT Start Time 0952    OT Stop Time 1040    OT Time Calculation (min) 48 min    Activity Tolerance Patient tolerated treatment well    Behavior During Therapy [facility] for tasks assessed/performed             Past Medical History:  Diagnosis Date   Atrial fibrillation (HCC)    CHF (congestive heart failure) (HCC)    Chronic pain    Coronary artery disease    Diabetes mellitus without complication (HCC)    GERD (gastroesophageal reflux disease)    High cholesterol    Hypertension    Hypothyroidism    Presence of permanent cardiac pacemaker    Past Surgical History:  Procedure Laterality Date   BACK SURGERY     CARDIAC CATHETERIZATION Left 10/23/2015   Procedure: Left Heart Cath and Coronary Angiography;  Surgeon: Alwyn Pea, MD;  Location: [facility] INVASIVE CV LAB;  Service: Cardiovascular;  Laterality: Left;   COLONOSCOPY N/A 04/29/2022   Procedure: COLONOSCOPY;  Surgeon: Jaynie Collins, DO;  Location: [facility] ENDOSCOPY;  Service: Gastroenterology;  Laterality: N/A;   COLONOSCOPY WITH PROPOFOL N/A 01/03/2017   Procedure: COLONOSCOPY WITH PROPOFOL;  Surgeon: Scot Jun, MD;  Location: [facility] ENDOSCOPY;  Service: Endoscopy;  Laterality: N/A;   COLONOSCOPY WITH PROPOFOL N/A 04/12/2022   Procedure: COLONOSCOPY WITH PROPOFOL;  Surgeon: Jaynie Collins, DO;  Location: [facility] ENDOSCOPY;  Service: Gastroenterology;  Laterality: N/A;   CORONARY STENT PLACEMENT     x 4 at [facility]. x 1 by [place] in 2004   INSERT / REPLACE / REMOVE PACEMAKER     LEFT HEART CATH AND CORONARY ANGIOGRAPHY N/A 06/08/2017   Procedure: LEFT HEART CATH AND CORONARY  ANGIOGRAPHY;  Surgeon: Alwyn Pea, MD;  Location: [facility] INVASIVE CV LAB;  Service: Cardiovascular;  Laterality: N/A;   PACEMAKER IMPLANT N/A 11/03/2021   Procedure: PACEMAKER IMPLANT;  Surgeon: Marcina Millard, MD;  Location: [facility] INVASIVE CV LAB;  Service: Cardiovascular;  Laterality: N/A;   Patient Active Problem List   Diagnosis Date Noted   Sick sinus syndrome (HCC) 11/03/2021   Weakness generalized 05/09/2019   Chronic diastolic heart failure (HCC) 03/23/2018   HTN (hypertension) 03/23/2018   DM (diabetes mellitus) (HCC) 03/23/2018   SOB (shortness of breath) 02/01/2018   Dizziness 08/01/2017   TIA (transient ischemic attack) 10/17/2015   Hyponatremia 10/17/2015   Anemia 10/17/2015   Chest pain 10/16/2015   Left-sided weakness 10/16/2015   Hypotension 10/16/2015   Bradycardia 10/16/2015   Hypoglycemia 10/16/2015    ONSET DATE: 3-6 months ago  REFERRING DIAG: Bilateral hand pain, CTS and thumb CMC OA  THERAPY DIAG:  Pain in both hands - Plan: Ot plan of care cert/re-cert  Stiffness of right wrist, not elsewhere classified - Plan: Ot plan of care cert/re-cert  Pain in right wrist - Plan: Ot plan of care cert/re-cert  Muscle weakness (generalized) - Plan: Ot plan of care cert/re-cert  Stiffness of right hand, not elsewhere classified - Plan: Ot plan of care cert/re-cert  Rationale for Evaluation and Treatment: Rehabilitation  SUBJECTIVE:   SUBJECTIVE STATEMENT:  Pt accompanied by: family member  PERTINENT HISTORY: 02/16/23 Dr Sherryll Burger note: 2. Pain in left CMC joint in patient with history of carpal tunnel syndrome - Wearing therapeutic gloves and night only - Is not interested in more medications   We will order occupational hand therapy with [facility]  If the patient does not receive a call to schedule their initial physical therapy appointment, please use the information below to schedule an appointment directly.  [facility] Address: [address] [place], [place], [place] [address] Phone: [phone]  3. Concern for polypharmacy-  Try cutting back on your pantoprazole  4. Pain in left first carpal metacarpal phalangeal joint and bilateral knees likely secondary to osteoarthritis   5. Low back pain with radiating pain down through bilateral lower extremity   6. 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 Bilateral carpal tunnel syndrome status post carpal tunnel injections  8. 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 Wellness Keep up the healthy eating! We recommend a diet consisting of a variety of green leafy vegetables, fruits, nuts and other whole grain food. Avoid ultra-processed food, soda and artifical sweeteners.  - Eats tumeric on a regular basis   PRECAUTIONS: None    WEIGHT BEARING RESTRICTIONS: No  PAIN:  Are you having pain?  8/10 pain R first dorsal compartment tenderness positive Finkelstein ;left for/10  FALLS: Has patient fallen in last 6 months? No  LIVING ENVIRONMENT: Lives with-Spouse and son that is disabled   PATIENT GOALS: I want the pain better in my hands so that I can do things around the house  NEXT MD VISIT: 6 months  OBJECTIVE:  Note: Objective measures were completed at Evaluation unless otherwise noted.  HAND DOMINANCE: Right   UPPER EXTREMITY ROM:     Active ROM Right eval Left eval R/L 03/07/23  Shoulder flexion     Shoulder abduction     Shoulder adduction     Shoulder extension     Shoulder internal rotation     Shoulder external rotation     Elbow flexion     Elbow extension     Wrist flexion 35 pain radial wrist  75 pain volar wrist  65/75  Wrist extension 30 pain  radial wrist 70 70/70  Wrist ulnar deviation 12 pain radial wrist  30 24/30  Wrist radial deviation 15 20 16/20  Wrist pronation 90 90   Wrist supination 80 pain end range  90   (Blank rows = not tested)  Active ROM Right eval Left eval  Thumb MCP (0-60)    Thumb IP (0-80)    Thumb Radial abd/add (0-55) 45  45  Thumb Palmar abd/add (0-45) 42 55   Thumb Opposition to Small Finger To 5th  To 5th    Index MCP (0-90)     Index PIP (0-100)     Index DIP (0-70)      Long MCP (0-90)      Long PIP (0-100)      Long DIP (0-70)      Ring MCP (0-90)      Ring PIP (0-100)      Ring DIP (0-70)      Little MCP (0-90)  Little PIP (0-100)      Little DIP (0-70)      (Blank rows = not tested)  HAND FUNCTION: Grip strength: Right: 25 lbs; Left: 25 lbs, Lateral pinch: Right: 8 lbs, Left: 8 lbs, and 3 point pinch: Right: 7 lbs, Left: 6 lbs ( pain at 3 point volar wrist)  COORDINATION: Limited mostly by pain in thumbs and radial wrist R   SENSATION: Denies numbness   EDEMA: none notice  COGNITION: Overall cognitive status: Within functional limits for tasks assessed   OBSERVATIONS: Increase tightness in right webspace limiting palmar radial abduction with increased strain on abductor and extensors of thumb. Tenderness in webspace   TODAY'S TREATMENT:                                                                                                                              DATE: 05/02/23:  Pt did not realize it has been since October since she saw OT last. Reports she does not like the iontophoresis and does not want to do it today.  Patient continues to report increased pain over the right first dorsal compartment.  Tenderness as well as pain with wrist flexion and ulnar deviation as well as thumb radial abduction Left same symptoms but less pain. Pt reports she has not tried the voltaren ointment on her thumb yet (recommended last session)  Contrast:   Pt seen for use of  contrast to decrease edema, increase soft tissue mobility and decrease pain.     Manual Therapy:  Following contrast, manual therapy for soft tissue massage and mobs by OT for trigger point release and thumb webspace with metacarpal spreads.  Therapeutic Exercises: Pt seen for gentle ROM, modified tendon glides, thumb palmar and radial ABD.  Continued Instruction on modifications on enlarging grips and using larger joints.  Fitted with D ring wrist and thumb splint on left    PATIENT EDUCATION: Education details: findings of eval and HEP  Person educated: Patient and Spouse Education method: Explanation, Demonstration, Tactile cues, Verbal cues, and Handouts Education comprehension: verbalized understanding, returned demonstration, and needs further education    GOALS: Goals reviewed with patient? Yes LONG TERM GOALS: Target date: 6  wks   Patient to be independent in a HEP to decrease pain in bilateral hands  thumb and radial wrist to less than 2/10 to increase thumb  and wrist active range of motion in all planes Baseline: Thumb palmar abduction  R 42, L 55 and radial abduction  R 45, L 45 - stiffness in webpace - pain R 6/10 and L 5/10 - R wrist flexion 35 and ext 30  Goal status: Ongoing   2.  AROM in R and L thumb palmar/ radial abduction improved for patient to turn doorknob and use walker pain less than 2/10  Baseline: pain R 6/10 and L 5/10 - decrease thumb PA and RA - R worse than L - see goal 1  Goal  status: Ongoing   3.  Wrist AROM and strength increased to [facility] for patient to push and pull door open, use walker  bath and dress using bilateral hands with symptoms less than 2/10 Baseline: Right wrist flexion extension  30-35  with pain 6/10 pain .  Pain L thumb and radial wrist 5/10  Goal status: Ongoing   4.  Grip and prehension strength increased to without range for her age to use hands with symptoms less than 2/10 in ADL"S  Baseline: Right grip 25 pounds and left 25   pounds, lateral pinch right 8 pounds and left 8 pounds and 3-point pinch right 7 pound and left 6  pounds.  - pain 5-6/10 pain -  Goal status: Ongoing   5: Pt to verbalize and demo 3 modifications, adaptive equipment or joint protection to implement at home to decrease pain to less than 2/10 in bilateral hands. BASELINE: Patient no knowledge of adaptations or modifications.  Pain increased 5-6/10 in bilateral hands. GOAL STATUS: Ongoing  ASSESSMENT:  CLINICAL IMPRESSION: Patient seen for occupational therapy evaluation for bilateral hand pain with a diagnosis of carpal tunnel symptoms as well as CMC thumb OA.  Patient present with pain 6/10 in the right thumb CMC as well as radial wrist over first dorsal compartment.  Patient with increased tightness and tenderness over right webspace with limited thumb palmar radial abduction as well as wrist flexion and extension.  Left wrist active range of motion within functional limits as well as thumb palmar abduction.  Limited left thumb radial abduction.  Reports 5/10 pain in left thumb.  Appear patient had increased symptoms for more than 6 months to a year but increased in the last 3 months after using a walker because of knee and foot pain. Pt continues to demonstrate and report increased pain and tenderness over first dorsal compartment on the right with increased pain with wrist flexion and radial deviation with thumb radial abduction.  Right worse than left.  Patient with a prefab thumb spica on the right.  Pt has not been to therapy since the end of Oct, therefore goals not met yet. Pt did not wish to continue with iontophoresis treatment, reports she did not like the feeling of it last time.  Re-educated on use  benefits of ionto but will defer for now.  Continues with increased pain and tenderness throughout hand and thumb.  Pt continues to  benefit from skilled OT services to decrease pain as well as increase motion and strength to increase independence  in functional use of bilateral hands.  Patient in need of continued education for modifications and joint protection as well as adaptive equipment to decrease strain on hands in ADLs and IADLs.  PERFORMANCE DEFICITS: in functional skills including ADLs, IADLs, ROM, strength, pain, flexibility, decreased knowledge of use of DME, and UE functional use,   and psychosocial skills including environmental adaptation and routines and behaviors.   IMPAIRMENTS: are limiting patient from ADLs, IADLs, rest and sleep, play, leisure, and social participation.   COMORBIDITIES: has no other co-morbidities that affects occupational performance. Patient will benefit from skilled OT to address above impairments and improve overall function.  MODIFICATION OR ASSISTANCE TO COMPLETE EVALUATION: several modification of tasks or assist necessary to complete an evaluation.  OT OCCUPATIONAL PROFILE AND HISTORY: Problem focused assessment: Including review of records relating to presenting problem.  CLINICAL DECISION MAKING: Medium treatment options,  task modification necessary to decrease pain   REHAB POTENTIAL: Good for goals  EVALUATION COMPLEXITY: Medium   PLAN:  OT FREQUENCY: 1 -2 x/week  OT DURATION: 6 weeks PLANNING: Splinting;Paraffin;Fluidtherapy;Contrast Bath;DME and/or AE instruction;Manual Therapy;Passive range of motion;Scar mobilization;Therapeutic activities, Ultrasound, Iontophoresis, There ex, pt education    Milia Warth, OTR/L,CLT 05/04/2023, 7:10 PM

## 2023-05-09 ENCOUNTER — Ambulatory Visit: Payer: Medicare HMO | Admitting: Physical Therapy

## 2023-05-09 ENCOUNTER — Ambulatory Visit: Payer: Medicare HMO | Admitting: Occupational Therapy

## 2023-05-09 ENCOUNTER — Encounter: Payer: Self-pay | Admitting: Occupational Therapy

## 2023-05-09 ENCOUNTER — Encounter: Payer: Medicare HMO | Admitting: Physical Therapy

## 2023-05-09 DIAGNOSIS — M79642 Pain in left hand: Secondary | ICD-10-CM | POA: Diagnosis not present

## 2023-05-09 DIAGNOSIS — M79641 Pain in right hand: Secondary | ICD-10-CM

## 2023-05-09 DIAGNOSIS — R2689 Other abnormalities of gait and mobility: Secondary | ICD-10-CM

## 2023-05-09 DIAGNOSIS — M6281 Muscle weakness (generalized): Secondary | ICD-10-CM

## 2023-05-09 DIAGNOSIS — R262 Difficulty in walking, not elsewhere classified: Secondary | ICD-10-CM | POA: Diagnosis not present

## 2023-05-09 DIAGNOSIS — M25531 Pain in right wrist: Secondary | ICD-10-CM

## 2023-05-09 DIAGNOSIS — M25641 Stiffness of right hand, not elsewhere classified: Secondary | ICD-10-CM | POA: Diagnosis not present

## 2023-05-09 DIAGNOSIS — M25631 Stiffness of right wrist, not elsewhere classified: Secondary | ICD-10-CM

## 2023-05-09 NOTE — Therapy (Signed)
OUTPATIENT PHYSICAL THERAPY LOWER EXTREMITY EVALUATION   Patient Name: Christine Zimmerman MRN: [id number] DOB:[birth year], 77 y.o., female Today's Date: 05/09/2023  END OF SESSION:  PT End of Session - 05/09/23 1623     Visit Number 1    Number of Visits 22    Date for PT Re-Evaluation 07/18/23    Authorization Type 11/18-1/31/24  Authorization #[id number]  [facility] 2024    Authorization - Visit Number 1    Authorization - Number of Visits 22    Progress Note Due on Visit 10    PT Start Time 1600    PT Stop Time 1645    PT Time Calculation (min) 45 min    Activity Tolerance Patient tolerated treatment well    Behavior During Therapy WFL for tasks assessed/performed             Past Medical History:  Diagnosis Date   Atrial fibrillation (HCC)    CHF (congestive heart failure) (HCC)    Chronic pain    Coronary artery disease    Diabetes mellitus without complication (HCC)    GERD (gastroesophageal reflux disease)    High cholesterol    Hypertension    Hypothyroidism    Presence of permanent cardiac pacemaker    Past Surgical History:  Procedure Laterality Date   BACK SURGERY     CARDIAC CATHETERIZATION Left 10/23/2015   Procedure: Left Heart Cath and Coronary Angiography;  Surgeon: Alwyn Pea, MD;  Location: [facility] INVASIVE CV LAB;  Service: Cardiovascular;  Laterality: Left;   COLONOSCOPY N/A 04/29/2022   Procedure: COLONOSCOPY;  Surgeon: Jaynie Collins, DO;  Location: [facility] ENDOSCOPY;  Service: Gastroenterology;  Laterality: N/A;   COLONOSCOPY WITH PROPOFOL N/A 01/03/2017   Procedure: COLONOSCOPY WITH PROPOFOL;  Surgeon: Scot Jun, MD;  Location: [facility] ENDOSCOPY;  Service: Endoscopy;  Laterality: N/A;   COLONOSCOPY WITH PROPOFOL N/A 04/12/2022   Procedure: COLONOSCOPY WITH PROPOFOL;  Surgeon: Jaynie Collins, DO;  Location: [facility] ENDOSCOPY;  Service: Gastroenterology;  Laterality: N/A;   CORONARY STENT PLACEMENT     x 4 at [facility]. x 1 by  [place] in 2004   INSERT / REPLACE / REMOVE PACEMAKER     LEFT HEART CATH AND CORONARY ANGIOGRAPHY N/A 06/08/2017   Procedure: LEFT HEART CATH AND CORONARY ANGIOGRAPHY;  Surgeon: Alwyn Pea, MD;  Location: [facility] INVASIVE CV LAB;  Service: Cardiovascular;  Laterality: N/A;   PACEMAKER IMPLANT N/A 11/03/2021   Procedure: PACEMAKER IMPLANT;  Surgeon: Marcina Millard, MD;  Location: [facility] INVASIVE CV LAB;  Service: Cardiovascular;  Laterality: N/A;   Patient Active Problem List   Diagnosis Date Noted   Sick sinus syndrome (HCC) 11/03/2021   Weakness generalized 05/09/2019   Chronic diastolic heart failure (HCC) 03/23/2018   HTN (hypertension) 03/23/2018   DM (diabetes mellitus) (HCC) 03/23/2018   SOB (shortness of breath) 02/01/2018   Dizziness 08/01/2017   TIA (transient ischemic attack) 10/17/2015   Hyponatremia 10/17/2015   Anemia 10/17/2015   Chest pain 10/16/2015   Left-sided weakness 10/16/2015   Hypotension 10/16/2015   Bradycardia 10/16/2015   Hypoglycemia 10/16/2015    PCP: Dr. Barbette Reichmann   REFERRING PROVIDER: Dr. Barbette Reichmann   REFERRING DIAG:  M25.569 (ICD-10-CM) - Pain in joint, lower leg  M25.561 (ICD-10-CM) - Pain in right knee  M25.562 (ICD-10-CM) - Pain in left knee  M79.609 (ICD-10-CM) - Pain in limb  M79.604 (ICD-10-CM) - Pain in right leg  M79.605 (ICD-10-CM) - Pain in left leg  THERAPY DIAG:  No diagnosis found.  Rationale for Evaluation and Treatment: Rehabilitation  ONSET DATE: 04/2019  SUBJECTIVE:   SUBJECTIVE STATEMENT: See pertinent history   PERTINENT HISTORY: Pt reports ongoing knee pain that has been making it difficult for her to move and she has been steadily becoming less mobile. She used to go to water aerobics and that is where she got most of her exercise, but she is afraid of falling and this keeps her from going to the class at the [facility]. Pt is a diabetic and she has diabetic neuropathy in her feet. She now has  to use a single point cane to avoid from falling.  PAIN:  Are you having pain? Yes: NPRS scale: 8/10 Pain location: All around knee  Pain description: Achy  Aggravating factors: Walking and standing or basically any weight bearing, stairs  Relieving factors: Vicks helps her go to sleep.   PRECAUTIONS: None  RED FLAGS: None   WEIGHT BEARING RESTRICTIONS: No  FALLS:  Has patient fallen in last 6 months? No  LIVING ENVIRONMENT: Lives with: lives with their spouse Lives in: House/apartment Stairs: Yes: External: 5 steps; on right going up Has following equipment at home: Single point cane and Walker - 2 wheeled  OCCUPATION: Retired   PLOF: Independent  PATIENT GOALS: To be able to walk without an assistive device and to get around her home and upstairs without help   NEXT MD VISIT: January 2025   OBJECTIVE:  Note: Objective measures were completed at Evaluation unless otherwise noted.  VITALS: 197/78 in sitting                 195/78 in sitting   DIAGNOSTIC FINDINGS:   AP and lateral x-rays of the lumbar spine were ordered and personally  reviewed today. These show extensive degenerative disc disease with  extensive osteophyte formation, mild SI arthritis on the AP view, no  scoliosis, pedicles intact. Lateral view shows severe degenerative changes  to the L4-5 disc space with extensive facet arthritis. No  spondylolisthesis, no fracture.  X-ray impression: Extensive degenerative disc and facet arthritis, lumbar  spine.   PATIENT SURVEYS:  FOTO 40 with target of    COGNITION: Overall cognitive status: Within functional limits for tasks assessed     SENSATION: Not tested  MUSCLE LENGTH:  Not performed   POSTURE: rounded shoulders  PALPATION: None performed    LOWER EXTREMITY ROM:  WFL   LOWER EXTREMITY MMT:   (Blank rows = not tested)  LOWER EXTREMITY SPECIAL TESTS: None performed   FUNCTIONAL TESTS:  5 times sit to stand: 0 reps 30 seconds chair  stand test: 0 reps  Timed up and go (TUG): Not tested  2 minute walk test: 170 ft with use of single point cane  10 meter walk test: NT  Dynamic Gait Index: NT  MCTSIB: Condition 1: Avg of 3 trials: NT sec, Condition 2: Avg of 3 trials: NT sec, Condition 3: Avg of 3 trials: NT sec, Condition 4: Avg of 3 trials: NT sec, and Total Score: NT   GAIT: Distance walked: 50 ft  Assistive device utilized: Single point cane Level of assistance: Modified independence Comments: Right sided antalgic gait  TREATMENT DATE:   05/09/23:  Sit to stand with 1 UE support 1 x 5  -MIN VC to increase speed of stand and decrease speed of sit   PATIENT EDUCATION:  Education details: Form and technique for correct performance of exercise and explanation about deficits  Person educated: Patient and Spouse Education method: Explanation, Demonstration, Verbal cues, and Handouts Education comprehension: verbalized understanding, returned demonstration, and verbal cues required  HOME EXERCISE PROGRAM: Access Code: [email or web address] URL: [email or web address] Date: 05/09/2023 Prepared by: Ellin Goodie  Exercises - Sit to Stand  - 3-4 x weekly - 3 sets - 5 reps  ASSESSMENT:  CLINICAL IMPRESSION: Patient is a 77 y.o. female who was seen today for physical therapy evaluation and treatment for unsteadiness with her gait and fear of falling along with bilateral LE pain. Pt demonstrates decreased LE strength that indicates she is at an increased risk for falls. She demonstrates right side antalgic gait with decreased stance time and step length on RLE. Pt's systolic blood pressure elevated without symptoms. PT instructed pt to take blood pressure medications before coming into visit in future or she would be unable to participate in exercise. Due to limited time, deferred further  functional testing until next visit. She will benefit from skilled PT to improve LE strength and aerobic endurance to improve mobility and decrease risk of falling while performing activities of daily living like grocery shopping or negotiating stairs.   OBJECTIVE IMPAIRMENTS: Abnormal gait, decreased balance, decreased endurance, difficulty walking, decreased strength, and pain.   ACTIVITY LIMITATIONS: carrying, lifting, bending, standing, squatting, stairs, transfers, and locomotion level  PARTICIPATION LIMITATIONS: meal prep, cleaning, laundry, driving, shopping, community activity, and church  PERSONAL FACTORS: Age, Past/current experiences, Time since onset of injury/illness/exacerbation, and 3+ comorbidities: HTN, T2DM, Afib  are also affecting patient's functional outcome.   REHAB POTENTIAL: Good  CLINICAL DECISION MAKING: Stable/uncomplicated  EVALUATION COMPLEXITY: Low   GOALS: Goals reviewed with patient? No  SHORT TERM GOALS: Target date: 05/23/2023  PT reviewed the following HEP with patient with patient able to demonstrate a set of the following with min cuing for correction needed. PT educated patient on parameters of therex (how/when to inc/decrease intensity, frequency, rep/set range, stretch hold time, and purpose of therex) with verbalized understanding.  Baseline:NT  Goal status: INITIAL  2.  Patient will be able to perform a sit to stand without use of upper extremity support from 18 inch seated surface as evidence of improved LE strength.  Baseline: Needs to use hands  Goal status: INITIAL    LONG TERM GOALS: Target date: 07/18/2023  Patient will have improved function and activity level as evidenced by an increase in FOTO score by 8 points or more.  Baseline: 40 with target of 48  Goal status: INITIAL  2.  Patient will perform five sit to stand repetitions in <=15 secs as evidence of LE strength that shows that this community dwelling older adult that is older  is at a decreased risk of falling. (Buatois 2010)   Baseline: 0 reps; needs to use hands  Goal status: INITIAL  3.  Patient will perform >=11 reps for 30 sec chair stands as evidence of improved LE endurance that decreases her risk of falling.  Baseline: 0 reps  Goal status: INITIAL  4.  Patient will improve distance by >=40 ft for improved aerobic endurance and increased mobility to resume yard work outside [employer], 2009).  Baseline: 175 ft  Goal status: ONGOING  5.  Patient will be able to negotiate >=5 stairs without modified independence as evidence of improved mobility and to decrease caregiver burden.  Baseline: NT  Goal status: INITIAL  6. Patient will perform TUG in <13.5 sec as evidence of improved mobility and decreased risk for falling (Shumway-Cook et al., 2000) Baseline: NT  Goal status: INITIAL   PLAN:  PT FREQUENCY: 1-2x/week  PT DURATION: 10 weeks  PLANNED INTERVENTIONS: 97164- PT Re-evaluation, 97110-Therapeutic exercises, 97530- Therapeutic activity, 97112- Neuromuscular re-education, 97535- Self Care, 16109- Manual therapy, [phone]- Gait training, [phone]- Orthotic Fit/training, 97014- Electrical stimulation (unattended), [id number]- Electrical stimulation (manual), Patient/Family education, Balance training, Stair training, Dry Needling, Joint mobilization, Joint manipulation, Spinal manipulation, Spinal mobilization, Vestibular training, DME instructions, Cryotherapy, and Moist heat  PLAN FOR NEXT SESSION: DGI, , MCTSIB, potentially BESTTEST   Ellin Goodie PT, DPT  [facility] [address], [place], [address] Phone: [phone]   Fax:  [phone]

## 2023-05-09 NOTE — Therapy (Addendum)
OUTPATIENT OCCUPATIONAL THERAPY ORTHO TREATMENT/RECERTIFICATION  Patient Name: Christine Zimmerman MRN: [id number] DOB:[birth year], 77 y.o., female  PCP: Dr Dion Saucier PROVIDER: Dr Sherryll Burger  END OF SESSION:  OT End of Session - 05/09/23 2053     Visit Number 5    Number of Visits 10    Date for OT Re-Evaluation 06/13/23    OT Start Time 1645    OT Stop Time 1732    OT Time Calculation (min) 47 min    Activity Tolerance Patient tolerated treatment well    Behavior During Therapy WFL for tasks assessed/performed             Past Medical History:  Diagnosis Date   Atrial fibrillation (HCC)    CHF (congestive heart failure) (HCC)    Chronic pain    Coronary artery disease    Diabetes mellitus without complication (HCC)    GERD (gastroesophageal reflux disease)    High cholesterol    Hypertension    Hypothyroidism    Presence of permanent cardiac pacemaker    Past Surgical History:  Procedure Laterality Date   BACK SURGERY     CARDIAC CATHETERIZATION Left 10/23/2015   Procedure: Left Heart Cath and Coronary Angiography;  Surgeon: Alwyn Pea, MD;  Location: [facility] INVASIVE CV LAB;  Service: Cardiovascular;  Laterality: Left;   COLONOSCOPY N/A 04/29/2022   Procedure: COLONOSCOPY;  Surgeon: Jaynie Collins, DO;  Location: [facility] ENDOSCOPY;  Service: Gastroenterology;  Laterality: N/A;   COLONOSCOPY WITH PROPOFOL N/A 01/03/2017   Procedure: COLONOSCOPY WITH PROPOFOL;  Surgeon: Scot Jun, MD;  Location: [facility] ENDOSCOPY;  Service: Endoscopy;  Laterality: N/A;   COLONOSCOPY WITH PROPOFOL N/A 04/12/2022   Procedure: COLONOSCOPY WITH PROPOFOL;  Surgeon: Jaynie Collins, DO;  Location: [facility] ENDOSCOPY;  Service: Gastroenterology;  Laterality: N/A;   CORONARY STENT PLACEMENT     x 4 at [facility]. x 1 by [place] in 2004   INSERT / REPLACE / REMOVE PACEMAKER     LEFT HEART CATH AND CORONARY ANGIOGRAPHY N/A 06/08/2017   Procedure: LEFT HEART CATH AND CORONARY  ANGIOGRAPHY;  Surgeon: Alwyn Pea, MD;  Location: [facility] INVASIVE CV LAB;  Service: Cardiovascular;  Laterality: N/A;   PACEMAKER IMPLANT N/A 11/03/2021   Procedure: PACEMAKER IMPLANT;  Surgeon: Marcina Millard, MD;  Location: [facility] INVASIVE CV LAB;  Service: Cardiovascular;  Laterality: N/A;   Patient Active Problem List   Diagnosis Date Noted   Sick sinus syndrome (HCC) 11/03/2021   Weakness generalized 05/09/2019   Chronic diastolic heart failure (HCC) 03/23/2018   HTN (hypertension) 03/23/2018   DM (diabetes mellitus) (HCC) 03/23/2018   SOB (shortness of breath) 02/01/2018   Dizziness 08/01/2017   TIA (transient ischemic attack) 10/17/2015   Hyponatremia 10/17/2015   Anemia 10/17/2015   Chest pain 10/16/2015   Left-sided weakness 10/16/2015   Hypotension 10/16/2015   Bradycardia 10/16/2015   Hypoglycemia 10/16/2015    ONSET DATE: 3-6 months ago  REFERRING DIAG: Bilateral hand pain, CTS and thumb CMC OA  THERAPY DIAG:  Pain in both hands  Stiffness of right wrist, not elsewhere classified  Pain in right wrist  Muscle weakness (generalized)  Stiffness of right hand, not elsewhere classified  Rationale for Evaluation and Treatment: Rehabilitation  SUBJECTIVE:   SUBJECTIVE STATEMENT: Pt reports she didn't like ionto a couple sessions ago and didn't want to do it the last 2 sessions.  She reports continued pain in her bilateral hands and especially at the thumbs.  Pt accompanied by: family member  PERTINENT HISTORY: 02/16/23 Dr Sherryll Burger note: 2. Pain in left CMC joint in patient with history of carpal tunnel syndrome - Wearing therapeutic gloves and night only - Is not interested in more medications   We will order occupational hand therapy with [facility]  If the patient does not receive a call to schedule their initial physical therapy appointment, please use the information below to schedule an appointment directly.  [facility] Address: [address] [place], [place], [place] [address] Phone: [phone]  3. Concern for polypharmacy-  Try cutting back on your pantoprazole  4. Pain in left first carpal metacarpal phalangeal joint and bilateral knees likely secondary to osteoarthritis   5. Low back pain with radiating pain down through bilateral lower extremity   6. 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 Bilateral carpal tunnel syndrome status post carpal tunnel injections  8. 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 Wellness Keep up the healthy eating! We recommend a diet consisting of a variety of green leafy vegetables, fruits, nuts and other whole grain food. Avoid ultra-processed food, soda and artifical sweeteners.  - Eats tumeric on a regular basis   PRECAUTIONS: None    WEIGHT BEARING RESTRICTIONS: No  PAIN:  Are you having pain?  8/10 pain R first dorsal compartment tenderness positive Finkelstein ;left for/10  FALLS: Has patient fallen in last 6 months? No  LIVING ENVIRONMENT: Lives with-Spouse and son that is disabled   PATIENT GOALS: I want the pain better in my hands so that I can do things around the house  NEXT MD VISIT: 6 months  OBJECTIVE:  Note: Objective measures were completed at Evaluation unless otherwise noted.  HAND DOMINANCE: Right   UPPER EXTREMITY ROM:     Active ROM Right eval Left eval R/L 03/07/23 05/09/23 R/L  Shoulder flexion      Shoulder abduction      Shoulder adduction      Shoulder extension      Shoulder internal rotation      Shoulder external rotation      Elbow flexion      Elbow extension      Wrist flexion 35 pain radial wrist  75 pain volar wrist  65/75 60/70   Wrist extension 30 pain radial wrist 70 70/70 60/60  Wrist ulnar deviation 12 pain radial wrist  30 24/30 24/28  Wrist radial deviation 15 20 16/20 18/20  Wrist pronation 90 90  90  Wrist supination 80 pain end range  90  90  (Blank rows = not tested)  Active ROM Right eval Left eval  Thumb MCP (0-60)    Thumb IP (0-80)    Thumb Radial abd/add (0-55) 45  45  Thumb Palmar abd/add (0-45) 42 55   Thumb Opposition to Small Finger To 5th  To 5th    Index MCP (0-90)     Index PIP (0-100)     Index DIP (0-70)      Long MCP (0-90)      Long PIP (0-100)      Long DIP (0-70)      Ring MCP (0-90)      Ring PIP (0-100)  Ring DIP (0-70)      Little MCP (0-90)      Little PIP (0-100)      Little DIP (0-70)      (Blank rows = not tested)  HAND FUNCTION: Grip strength: Right: 25 lbs; Left: 25 lbs, Lateral pinch: Right: 8 lbs, Left: 8 lbs, and 3 point pinch: Right: 7 lbs, Left: 6 lbs ( pain at 3 point volar wrist)  COORDINATION: Limited mostly by pain in thumbs and radial wrist R   SENSATION: Denies numbness   EDEMA: none notice  COGNITION: Overall cognitive status: Within functional limits for tasks assessed   OBSERVATIONS: Increase tightness in right webspace limiting palmar radial abduction with increased strain on abductor and extensors of thumb. Tenderness in webspace   TODAY'S TREATMENT:                                                                                                                              DATE: 05/09/23:   Patient continues to report increased pain over the right first dorsal compartment.  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   Decreased pain noted after fluidotherapy this date.    Fluidotherapy:   Pt seen for use of fluidotherapy to decrease pain, increase tissue mobility and  increase ROM.  Applied bilaterally and patient performing active range of motion while in fluido.     Manual Therapy:  Following fluidotherapy, manual therapy for soft tissue massage and mobs by OT for trigger point release and thumb webspace with metacarpal spreads.  Therapeutic Exercises: Pt seen for gentle ROM, modified tendon glides, thumb palmar and radial ABD.  Pt requires cues for proper form and technique.  Continued Instruction on modifications on enlarging grips and using larger joints.  Issued red foam this date to apply to utensils, toothbrush, pen to increase surface area, provide joint protection and decrease pain.   Discussed bread making and to watch how she performs this task to see if we can make recommendations to modify activity.    Pt to continue to use D ring wrist and thumb splint on left for support with tasks but remove frequently to perform ROM.     PATIENT EDUCATION: Education details: findings of eval and HEP  Person educated: Patient and Spouse Education method: Explanation, Demonstration, Tactile cues, Verbal cues, and Handouts Education comprehension: verbalized understanding, returned demonstration, and needs further education    GOALS: Goals reviewed with patient? Yes LONG TERM GOALS: Target date: 6  wks   Patient to be independent in a HEP to decrease pain in bilateral hands  thumb and radial wrist to less than 2/10 to increase thumb  and wrist active range of motion in all planes Baseline: Thumb palmar abduction  R 42, L 55 and radial abduction  R 45, L 45 - stiffness in webpace - pain R 6/10 and L 5/10 - R wrist flexion 35 and ext 30  Goal status: Ongoing   2.  AROM in R and L thumb palmar/ radial abduction improved for patient to turn doorknob and use walker pain less than 2/10  Baseline: pain R 6/10 and L 5/10 - decrease thumb PA and RA - R worse than L - see goal 1  Goal status: Ongoing   3.  Wrist AROM and strength increased to [facility] for patient  to push and pull door open, use walker  bath and dress using bilateral hands with symptoms less than 2/10 Baseline: Right wrist flexion extension  30-35  with pain 6/10 pain .  Pain L thumb and radial wrist 5/10  Goal status: Ongoing   4.  Grip and prehension strength increased to without range for her age to use hands with symptoms less than 2/10 in ADL"S  Baseline: Right grip 25 pounds and left 25  pounds, lateral pinch right 8 pounds and left 8 pounds and 3-point pinch right 7 pound and left 6  pounds.  - pain 5-6/10 pain -  Goal status: Ongoing   5: Pt to verbalize and demo 3 modifications, adaptive equipment or joint protection to implement at home to decrease pain to less than 2/10 in bilateral hands. BASELINE: Patient no knowledge of adaptations or modifications.  Pain increased 5-6/10 in bilateral hands. GOAL STATUS: Ongoing  ASSESSMENT:  CLINICAL IMPRESSION: Patient seen for occupational therapy evaluation for bilateral hand pain with a diagnosis of carpal tunnel symptoms as well as CMC thumb OA.  Patient present with pain 6/10 in the right thumb CMC as well as radial wrist over first dorsal compartment.  Patient with increased tightness and tenderness over right webspace with limited thumb palmar radial abduction as well as wrist flexion and extension.  Left wrist active range of motion within functional limits as well as thumb palmar abduction.  Limited left thumb radial abduction.  Appear patient had increased symptoms for more than 6 months to a year but increased in the last 3 months after using a walker because of knee and foot pain. Pt continues to demonstrate and report increased pain and tenderness over first dorsal compartment on the right with increased pain with wrist flexion and radial deviation with thumb radial abduction.  Right worse than left.  Patient with a prefab thumb spica on the right.  Pt did not wish to continue with iontophoresis treatment, reports she did not like  the feeling of it in the past, will defer for now.  Continues with increased pain and tenderness throughout hand and thumb, responded well today during use of fluidotherapy and demonstrated increased range of motion in her digits and web space following intervention with decreased pain.  Pain was 8/10 at beginning of session, 4-5/10 at end of session.  Recommend pt use heat at home and perform exercises prior to engaging in daily bread making task.  She will monitor how she is using her hand during bread making to see if we can help modify task.  Issued red foam this date to assist with increasing surface area of grip, protect joints and decrease pain.  Pt continues to  benefit from skilled OT services to decrease pain as well as increase motion and strength to increase independence in functional use of bilateral hands.  Patient in need of continued education for modifications and joint protection as well as adaptive equipment  to decrease strain on hands in ADLs and IADLs.  PERFORMANCE DEFICITS: in functional skills including ADLs, IADLs, ROM, strength, pain, flexibility, decreased knowledge of use of DME, and UE functional use,   and psychosocial skills including environmental adaptation and routines and behaviors.   IMPAIRMENTS: are limiting patient from ADLs, IADLs, rest and sleep, play, leisure, and social participation.   COMORBIDITIES: has no other co-morbidities that affects occupational performance. Patient will benefit from skilled OT to address above impairments and improve overall function.  MODIFICATION OR ASSISTANCE TO COMPLETE EVALUATION: several modification of tasks or assist necessary to complete an evaluation.  OT OCCUPATIONAL PROFILE AND HISTORY: Problem focused assessment: Including review of records relating to presenting problem.  CLINICAL DECISION MAKING: Medium treatment options,  task modification necessary to decrease pain   REHAB POTENTIAL: Good for goals  EVALUATION  COMPLEXITY: Medium  PLAN:  OT FREQUENCY: 1 -2 x/week  OT DURATION: 6 weeks PLANNING: Splinting;Paraffin;Fluidtherapy;Contrast Bath;DME and/or AE instruction;Manual Therapy;Passive range of motion;Scar mobilization;Therapeutic activities, Ultrasound, Iontophoresis, There ex, pt education  Muadh Creasy, OTR/L,CLT 05/09/2023, 8:54 PM

## 2023-05-17 ENCOUNTER — Encounter: Payer: Self-pay | Admitting: Occupational Therapy

## 2023-05-17 ENCOUNTER — Ambulatory Visit: Payer: Medicare HMO | Admitting: Occupational Therapy

## 2023-05-17 ENCOUNTER — Encounter: Payer: Medicare HMO | Admitting: Physical Therapy

## 2023-05-17 ENCOUNTER — Ambulatory Visit: Payer: Medicare HMO | Admitting: Physical Therapy

## 2023-05-17 DIAGNOSIS — R262 Difficulty in walking, not elsewhere classified: Secondary | ICD-10-CM | POA: Diagnosis not present

## 2023-05-17 DIAGNOSIS — M25641 Stiffness of right hand, not elsewhere classified: Secondary | ICD-10-CM

## 2023-05-17 DIAGNOSIS — M25531 Pain in right wrist: Secondary | ICD-10-CM | POA: Diagnosis not present

## 2023-05-17 DIAGNOSIS — R2689 Other abnormalities of gait and mobility: Secondary | ICD-10-CM | POA: Diagnosis not present

## 2023-05-17 DIAGNOSIS — M6281 Muscle weakness (generalized): Secondary | ICD-10-CM

## 2023-05-17 DIAGNOSIS — M79641 Pain in right hand: Secondary | ICD-10-CM | POA: Diagnosis not present

## 2023-05-17 DIAGNOSIS — M25631 Stiffness of right wrist, not elsewhere classified: Secondary | ICD-10-CM | POA: Diagnosis not present

## 2023-05-17 DIAGNOSIS — M79642 Pain in left hand: Secondary | ICD-10-CM

## 2023-05-17 NOTE — Therapy (Addendum)
 OUTPATIENT PHYSICAL THERAPY LOWER EXTREMITY TREATMENT   Patient Name: Christine Zimmerman MRN: [id number] DOB:07/18/1945, 77 y.o., female Today's Date: 05/17/2023  END OF SESSION:  PT End of Session - 05/17/23 1654     Visit Number 2    Number of Visits 22    Date for PT Re-Evaluation 07/18/23    Authorization Type 11/18-1/31/24  Authorization #[id number]  [facility] 2024    Authorization - Visit Number 2    Authorization - Number of Visits 22    Progress Note Due on Visit 10    PT Start Time 1650    PT Stop Time 1730    PT Time Calculation (min) 40 min    Activity Tolerance Patient tolerated treatment well    Behavior During Therapy WFL for tasks assessed/performed              Past Medical History:  Diagnosis Date   Atrial fibrillation (HCC)    CHF (congestive heart failure) (HCC)    Chronic pain    Coronary artery disease    Diabetes mellitus without complication (HCC)    GERD (gastroesophageal reflux disease)    High cholesterol    Hypertension    Hypothyroidism    Presence of permanent cardiac pacemaker    Past Surgical History:  Procedure Laterality Date   BACK SURGERY     CARDIAC CATHETERIZATION Left 10/23/2015   Procedure: Left Heart Cath and Coronary Angiography;  Surgeon: Cara JONETTA Lovelace, MD;  Location: [facility] INVASIVE CV LAB;  Service: Cardiovascular;  Laterality: Left;   COLONOSCOPY N/A 04/29/2022   Procedure: COLONOSCOPY;  Surgeon: Onita Elspeth Sharper, DO;  Location: [facility] ENDOSCOPY;  Service: Gastroenterology;  Laterality: N/A;   COLONOSCOPY WITH PROPOFOL  N/A 01/03/2017   Procedure: COLONOSCOPY WITH PROPOFOL ;  Surgeon: Viktoria Lamar DASEN, MD;  Location: [facility] ENDOSCOPY;  Service: Endoscopy;  Laterality: N/A;   COLONOSCOPY WITH PROPOFOL  N/A 04/12/2022   Procedure: COLONOSCOPY WITH PROPOFOL ;  Surgeon: Onita Elspeth Sharper, DO;  Location: [facility] ENDOSCOPY;  Service: Gastroenterology;  Laterality: N/A;   CORONARY STENT PLACEMENT     x 4 at [facility]. x 1 by  [place] in 2004   INSERT / REPLACE / REMOVE PACEMAKER     LEFT HEART CATH AND CORONARY ANGIOGRAPHY N/A 06/08/2017   Procedure: LEFT HEART CATH AND CORONARY ANGIOGRAPHY;  Surgeon: Lovelace Cara JONETTA, MD;  Location: [facility] INVASIVE CV LAB;  Service: Cardiovascular;  Laterality: N/A;   PACEMAKER IMPLANT N/A 11/03/2021   Procedure: PACEMAKER IMPLANT;  Surgeon: Ammon Blunt, MD;  Location: [facility] INVASIVE CV LAB;  Service: Cardiovascular;  Laterality: N/A;   Patient Active Problem List   Diagnosis Date Noted   Sick sinus syndrome (HCC) 11/03/2021   Weakness generalized 05/09/2019   Chronic diastolic heart failure (HCC) 03/23/2018   HTN (hypertension) 03/23/2018   DM (diabetes mellitus) (HCC) 03/23/2018   SOB (shortness of breath) 02/01/2018   Dizziness 08/01/2017   TIA (transient ischemic attack) 10/17/2015   Hyponatremia 10/17/2015   Anemia 10/17/2015   Chest pain 10/16/2015   Left-sided weakness 10/16/2015   Hypotension 10/16/2015   Bradycardia 10/16/2015   Hypoglycemia 10/16/2015    PCP: Dr. Tamra Leventhal   REFERRING PROVIDER: Dr. Tamra Leventhal   REFERRING DIAG:  M25.569 (ICD-10-CM) - Pain in joint, lower leg  M25.561 (ICD-10-CM) - Pain in right knee  M25.562 (ICD-10-CM) - Pain in left knee  M79.609 (ICD-10-CM) - Pain in limb  M79.604 (ICD-10-CM) - Pain in right leg  M79.605 (ICD-10-CM) - Pain in left leg  THERAPY DIAG:  Difficulty in walking, not elsewhere classified  Imbalance  Rationale for Evaluation and Treatment: Rehabilitation  ONSET DATE: 04/2019  SUBJECTIVE:   SUBJECTIVE STATEMENT:          Pt reports that she had some difficulty in performing sit to stand with need for UE support to stand.   PERTINENT HISTORY: Pt reports ongoing knee pain that has been making it difficult for her to move and she has been steadily becoming less mobile. She used to go to water aerobics and that is where she got most of her exercise, but she is afraid of falling  and this keeps her from going to the class at the [facility]. Pt is a diabetic and she has diabetic neuropathy in her feet. She now has to use a single point cane to avoid from falling.  PAIN:  Are you having pain? Yes: NPRS scale: 3-4/10 Pain location: All around knee  Pain description: Achy  Aggravating factors: Walking and standing or basically any weight bearing, stairs  Relieving factors: Vicks helps her go to sleep.   PRECAUTIONS: None  RED FLAGS: None   WEIGHT BEARING RESTRICTIONS: No  FALLS:  Has patient fallen in last 6 months? No  LIVING ENVIRONMENT: Lives with: lives with their spouse Lives in: House/apartment Stairs: Yes: External: 5 steps; on right going up Has following equipment at home: Single point cane and Walker - 2 wheeled  OCCUPATION: Retired   PLOF: Independent  PATIENT GOALS: To be able to walk without an assistive device and to get around her home and upstairs without help   NEXT MD VISIT: January 2025   OBJECTIVE:  Note: Objective measures were completed at Evaluation unless otherwise noted.  VITALS: 197/78 in sitting                 195/78 in sitting   DIAGNOSTIC FINDINGS:   AP and lateral x-rays of the lumbar spine were ordered and personally  reviewed today. These show extensive degenerative disc disease with  extensive osteophyte formation, mild SI arthritis on the AP view, no  scoliosis, pedicles intact. Lateral view shows severe degenerative changes  to the L4-5 disc space with extensive facet arthritis. No  spondylolisthesis, no fracture.  X-ray impression: Extensive degenerative disc and facet arthritis, lumbar  spine.   PATIENT SURVEYS:  FOTO 40 with target of    COGNITION: Overall cognitive status: Within functional limits for tasks assessed     SENSATION: Not tested  MUSCLE LENGTH:  Not performed   POSTURE: rounded shoulders  PALPATION: None performed    LOWER EXTREMITY ROM:  WFL   LOWER EXTREMITY MMT:   (Blank rows =  not tested)  LOWER EXTREMITY SPECIAL TESTS: None performed   FUNCTIONAL TESTS:  5 times sit to stand: 0 reps 30 seconds chair stand test: 0 reps  Timed up and go (TUG): Not tested  2 minute walk test: 170 ft with use of single point cane  10 meter walk test: NT  Dynamic Gait Index: NT  MCTSIB: Condition 1: Avg of 3 trials: NT sec, Condition 2: Avg of 3 trials: NT sec, Condition 3: Avg of 3 trials: NT sec, Condition 4: Avg of 3 trials: NT sec, and Total Score: NT   GAIT: Distance walked: 50 ft  Assistive device utilized: Single point cane Level of assistance: Modified independence Comments: Right sided antalgic gait  TREATMENT DATE:   05/17/23: Nu-Step seat at 9 and resistance of 2 for 5 min  TUG: 20 sec with use of SPC; right sided antalgic gait.   10 Meter Walk Test   Gait Speed:    1st trial 19 sec , 2nd trial 22 sec, Avg= 20.5   sec - Normal Gait speed Avg= 0.49 m/sec   >1 m/sec Need Intervention to reduce falls risk  0.12 m/sec improvement represents a statistically significant improvement in gait speed    - <0.4 m/sec - household walker - associated with increased frailty, risk of death, hospitalization or falls;  2-year mortality and morbidity; increased functional impairments, severe  walking disability  - <0.6 m/sec associated with severe impairment; likely that pt is capable only of limited community or household ambulation; increased likelihood of dependence with ADLs.  - 0.4 - 0.79 m/sec - limited community ambulator - associated with need for intervention to reduce falls; increased risk for LE limitation and death and hospitalization in 1 year; increased dependence for personal care; 2-year mortality and morbidity; increased functional impairments, severe walking disability  DGI: Uses single point can throughout  -Item 1: 1- Moderate  Impairment  -Item 2:  1- Moderate Impairment  -Item 3: 2- Mild Impairment   -Item 4: 2- Mild Impairment  -Item 5: 2- Mild Impairment  -Item 6: 0- Severe   -Item 7: 2- Mild Impairment  -Item 8: 1- Moderate Impairment  Total: 11/24     05/09/23:  Sit to stand with 1 UE support 1 x 5  -MIN VC to increase speed of stand and decrease speed of sit   PATIENT EDUCATION:  Education details: Form and technique for correct performance of exercise and explanation about deficits  Person educated: Patient and Spouse Education method: Programmer, Multimedia, Facilities Manager, Verbal cues, and Handouts Education comprehension: verbalized understanding, returned demonstration, and verbal cues required  HOME EXERCISE PROGRAM: Access Code: [email or web address] URL: [email or web address] Date: 05/17/2023 Prepared by: Toribio Servant  Exercises - Sit to Stand  - 3-4 x weekly - 3 sets - 5 reps - Standing Heel Raise with Support  - 3-4 x weekly - 3 sets - 10 reps - Standing March with Counter Support  - 3-4 x weekly - 3 sets - 10 reps  ASSESSMENT:  CLINICAL IMPRESSION: Pt presents for follow up session after initial evaluation for unsteadiness. She continues to present at an increased risk for falls as evidenced by scores on further functional tests. PT continues to encourage pt to utilize single point cane to decrease of falling. Pt exhibits largest dynamic balance deficit in stair negotiation and stepping over obstacle with limited foot clearance. She was able to complete all exercises without an increase in her right knee pain and with limited UE support. She will continue to benefit from skilled PT to improve LE strength and aerobic endurance to improve mobility and decrease risk of falling while performing activities of daily living like grocery shopping or negotiating stairs.    OBJECTIVE IMPAIRMENTS: Abnormal gait, decreased balance, decreased endurance, difficulty walking, decreased strength, and pain.    ACTIVITY LIMITATIONS: carrying, lifting, bending, standing, squatting, stairs, transfers, and locomotion level  PARTICIPATION LIMITATIONS: meal prep, cleaning, laundry, driving, shopping, community activity, and church  PERSONAL FACTORS: Age, Past/current experiences, Time since onset of injury/illness/exacerbation, and 3+ comorbidities: HTN, T2DM, Afib  are also affecting patient's functional outcome.   REHAB POTENTIAL: Good  CLINICAL DECISION MAKING: Stable/uncomplicated  EVALUATION COMPLEXITY: Low   GOALS: Goals reviewed with patient? No  SHORT  TERM GOALS: Target date: 05/23/2023  PT reviewed the following HEP with patient with patient able to demonstrate a set of the following with min cuing for correction needed. PT educated patient on parameters of therex (how/when to inc/decrease intensity, frequency, rep/set range, stretch hold time, and purpose of therex) with verbalized understanding.  Baseline:NT  Goal status: INITIAL  2.  Patient will be able to perform a sit to stand without use of upper extremity support from 18 inch seated surface as evidence of improved LE strength.  Baseline: Needs to use hands  Goal status: ONGOING   3. Patient will demonstrate an improvement in DGI score of >=2 pts as evidence of improved dynamic balance to decrease risk of falling (Pardasaney et al, 2012).            Baseline: Needs to use hands  Goal status: ONGOING     LONG TERM GOALS: Target date: 07/18/2023  Patient will have improved function and activity level as evidenced by an increase in FOTO score by 8 points or more.  Baseline: 40 with target of 48  Goal status: ONGOING   2.  Patient will perform five sit to stand repetitions in <=15 secs as evidence of LE strength that shows that this community dwelling older adult that is older is at a decreased risk of falling. (Buatois 2010)   Baseline: 0 reps; needs to use hands  Goal status: ONGOING   3.  Patient will perform >=11 reps  for 30 sec chair stands as evidence of improved LE endurance that decreases her risk of falling.  Baseline: 0 reps  Goal status: ONGOING   4.  Patient will improve distance by >=40 ft for improved aerobic endurance and increased mobility to resume yard work outside [employer], 2009).  Baseline: 175 ft  Goal status: ONGOING  5.  Patient will be able to negotiate >=5 stairs without modified independence as evidence of improved mobility and to decrease caregiver burden.  Baseline: Pt is safest using SPC to negotiate stairs  Goal status: Deferred   6. Patient will perform TUG in <13.5 sec as evidence of improved mobility and decreased risk for falling (Shumway-Cook et al., 2000) Baseline: 20 sec  Goal status: ONGOING             7. Patient will demonstrate reduced falls risk as evidenced by Dynamic Gait Index (DGI) >19/24.  Baseline: 20 sec  Goal status: ONGOING    PLAN:  PT FREQUENCY: 1-2x/week  PT DURATION: 10 weeks  PLANNED INTERVENTIONS: 97164- PT Re-evaluation, 97110-Therapeutic exercises, 97530- Therapeutic activity, [id number]- Neuromuscular re-education, 97535- Self Care, [id number]- Manual therapy, [id number]- Gait training, [phone]- Orthotic Fit/training, 97014- Electrical stimulation (unattended), [phone]- Electrical stimulation (manual), Patient/Family education, Balance training, Stair training, Dry Needling, Joint mobilization, Joint manipulation, Spinal manipulation, Spinal mobilization, Vestibular training, DME instructions, Cryotherapy, and Moist heat  PLAN FOR NEXT SESSION:  MCTSIB and practice using cane on pt's left side which is strong side. Practice walking with head turns. Continue with OTAGO exercises.   Toribio Servant PT, DPT  [facility] [address], [place], [address] Phone: [phone]   Fax:  [phone]

## 2023-05-17 NOTE — Therapy (Signed)
 OUTPATIENT OCCUPATIONAL THERAPY ORTHO TREATMENT/RECERTIFICATION  Patient Name: Christine Zimmerman MRN: [id number] DOB:Oct 21, 1945, 77 y.o., female  PCP: Dr Sadie MART PROVIDER: Dr Maree  END OF SESSION:  OT End of Session - 05/17/23 1952     Visit Number 6    Number of Visits 10    Date for OT Re-Evaluation 06/13/23    OT Start Time 1545    OT Stop Time 1628    OT Time Calculation (min) 43 min    Activity Tolerance Patient tolerated treatment well    Behavior During Therapy WFL for tasks assessed/performed             Past Medical History:  Diagnosis Date   Atrial fibrillation (HCC)    CHF (congestive heart failure) (HCC)    Chronic pain    Coronary artery disease    Diabetes mellitus without complication (HCC)    GERD (gastroesophageal reflux disease)    High cholesterol    Hypertension    Hypothyroidism    Presence of permanent cardiac pacemaker    Past Surgical History:  Procedure Laterality Date   BACK SURGERY     CARDIAC CATHETERIZATION Left 10/23/2015   Procedure: Left Heart Cath and Coronary Angiography;  Surgeon: Cara JONETTA Lovelace, MD;  Location: [facility] INVASIVE CV LAB;  Service: Cardiovascular;  Laterality: Left;   COLONOSCOPY N/A 04/29/2022   Procedure: COLONOSCOPY;  Surgeon: Onita Elspeth Sharper, DO;  Location: [facility] ENDOSCOPY;  Service: Gastroenterology;  Laterality: N/A;   COLONOSCOPY WITH PROPOFOL  N/A 01/03/2017   Procedure: COLONOSCOPY WITH PROPOFOL ;  Surgeon: Viktoria Lamar DASEN, MD;  Location: [facility] ENDOSCOPY;  Service: Endoscopy;  Laterality: N/A;   COLONOSCOPY WITH PROPOFOL  N/A 04/12/2022   Procedure: COLONOSCOPY WITH PROPOFOL ;  Surgeon: Onita Elspeth Sharper, DO;  Location: [facility] ENDOSCOPY;  Service: Gastroenterology;  Laterality: N/A;   CORONARY STENT PLACEMENT     x 4 at [facility]. x 1 by [place] in 2004   INSERT / REPLACE / REMOVE PACEMAKER     LEFT HEART CATH AND CORONARY ANGIOGRAPHY N/A 06/08/2017   Procedure: LEFT HEART CATH AND CORONARY  ANGIOGRAPHY;  Surgeon: Lovelace Cara JONETTA, MD;  Location: [facility] INVASIVE CV LAB;  Service: Cardiovascular;  Laterality: N/A;   PACEMAKER IMPLANT N/A 11/03/2021   Procedure: PACEMAKER IMPLANT;  Surgeon: Ammon Blunt, MD;  Location: [facility] INVASIVE CV LAB;  Service: Cardiovascular;  Laterality: N/A;   Patient Active Problem List   Diagnosis Date Noted   Sick sinus syndrome (HCC) 11/03/2021   Weakness generalized 05/09/2019   Chronic diastolic heart failure (HCC) 03/23/2018   HTN (hypertension) 03/23/2018   DM (diabetes mellitus) (HCC) 03/23/2018   SOB (shortness of breath) 02/01/2018   Dizziness 08/01/2017   TIA (transient ischemic attack) 10/17/2015   Hyponatremia 10/17/2015   Anemia 10/17/2015   Chest pain 10/16/2015   Left-sided weakness 10/16/2015   Hypotension 10/16/2015   Bradycardia 10/16/2015   Hypoglycemia 10/16/2015    ONSET DATE: 3-6 months ago  REFERRING DIAG: Bilateral hand pain, CTS and thumb CMC OA  THERAPY DIAG:  Muscle weakness (generalized)  Stiffness of right hand, not elsewhere classified  Stiffness of right wrist, not elsewhere classified  Pain in right wrist  Pain in both hands  Rationale for Evaluation and Treatment: Rehabilitation  SUBJECTIVE:   SUBJECTIVE STATEMENT: Pt reports she has some shooting pains from the base of the thumbs to the elbow at times, happens when arm is at rest.  She reports paraffin feels good to her hands and helps her to  be able to move better.  Pt accompanied by: family member  PERTINENT HISTORY: 02/16/23 Dr Maree note: 2. Pain in left CMC joint in patient with history of carpal tunnel syndrome - Wearing therapeutic gloves and night only - Is not interested in more medications   We will order occupational hand therapy with [facility]  If the patient does not receive a call to schedule their initial physical therapy appointment, please use the information below to schedule an appointment directly.  [facility] Address: [address] [place], [place], [place] [address] Phone: [phone]  3. Concern for polypharmacy-  Try cutting back on your pantoprazole   4. Pain in left first carpal metacarpal phalangeal joint and bilateral knees likely secondary to osteoarthritis   5. Low back pain with radiating pain down through bilateral lower extremity   6. 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 Bilateral carpal tunnel syndrome status post carpal tunnel injections  8. 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 Wellness Keep up the healthy eating! We recommend a diet consisting of a variety of green leafy vegetables, fruits, nuts and other whole grain food. Avoid ultra-processed food, soda and artifical sweeteners.  - Eats tumeric on a regular basis   PRECAUTIONS: None    WEIGHT BEARING RESTRICTIONS: No  PAIN:  Are you having pain?  8/10 pain R first dorsal compartment tenderness positive Finkelstein ;left for/10  FALLS: Has patient fallen in last 6 months? No  LIVING ENVIRONMENT: Lives with-Spouse and son that is disabled   PATIENT GOALS: I want the pain better in my hands so that I can do things around the house  NEXT MD VISIT: 6 months  OBJECTIVE:  Note: Objective measures were completed at Evaluation unless otherwise noted.  HAND DOMINANCE: Right   UPPER EXTREMITY ROM:     Active ROM Right eval Left eval R/L 03/07/23 05/09/23 R/L  Shoulder flexion      Shoulder abduction      Shoulder adduction      Shoulder extension      Shoulder internal rotation      Shoulder external rotation      Elbow flexion      Elbow extension      Wrist flexion 35 pain radial wrist  75 pain volar  wrist  65/75 60/70  Wrist extension 30 pain radial wrist 70 70/70 60/60  Wrist ulnar deviation 12 pain radial wrist  30 24/30 24/28  Wrist radial deviation 15 20 16/20 18/20  Wrist pronation 90 90  90  Wrist supination 80 pain end range  90  90  (Blank rows = not tested)  Active ROM Right eval Left eval  Thumb MCP (0-60)    Thumb IP (0-80)    Thumb Radial abd/add (0-55) 45  45  Thumb Palmar abd/add (0-45) 42 55   Thumb Opposition to Small Finger To 5th  To 5th    Index MCP (0-90)     Index PIP (0-100)     Index DIP (0-70)      Long MCP (0-90)      Long PIP (0-100)      Long DIP (0-70)      Ring MCP (0-90)      Ring PIP (  0-100)      Ring DIP (0-70)      Little MCP (0-90)      Little PIP (0-100)      Little DIP (0-70)      (Blank rows = not tested)  HAND FUNCTION: Grip strength: Right: 25 lbs; Left: 25 lbs, Lateral pinch: Right: 8 lbs, Left: 8 lbs, and 3 point pinch: Right: 7 lbs, Left: 6 lbs ( pain at 3 point volar wrist)  COORDINATION: Limited mostly by pain in thumbs and radial wrist R   SENSATION: Denies numbness   EDEMA: none notice  COGNITION: Overall cognitive status: Within functional limits for tasks assessed   OBSERVATIONS: Increase tightness in right webspace limiting palmar radial abduction with increased strain on abductor and extensors of thumb. Tenderness in webspace   TODAY'S TREATMENT:                                                                                                                              DATE: 05/17/23:   Patient continues to report increased pain over the right and left first dorsal compartments, she feels her right hand has improved some and is better than the left today.   Pt prefers paraffin over fluidotherapy and reports some pain relief and increased motion with the use of paraffin.  Paraffin: Paraffin to bilateral wrist and hands to decrease pain, increase tissue mobility and increase range of motion.  8 mins      Manual Therapy:  Following paraffin, manual therapy for soft tissue massage and mobs by OT for trigger point release and thumb webspace with metacarpal spreads.  Increased web space ROM this date compared to other sessions bilaterally.    Therapeutic Exercises: Pt seen for gentle ROM, modified tendon glides, thumb palmar and radial ABD.  Pt requires cues for proper form and technique. Grasp and hold of functional objects to improve web space motion and able to demonstrate improved range this date in this area.  Pt did not place her red foam on tools/utensils, reviewed this again for enlarging surface area of items and using larger joints.  Added grasp and release of blue density foam block with bilateral hands with gentle squeezing.  Pt to continue to use D ring wrist and thumb splint on left for support with tasks but remove frequently to perform ROM.    PATIENT EDUCATION: Education details: findings of eval and HEP  Person educated: Patient and Spouse Education method: Explanation, Demonstration, Tactile cues, Verbal cues, and Handouts Education comprehension: verbalized understanding, returned demonstration, and needs further education GOALS: Goals reviewed with patient? Yes LONG TERM GOALS: Target date: 6  wks   Patient to be independent in a HEP to decrease pain in bilateral hands  thumb and radial wrist to less than 2/10 to increase thumb  and wrist active range of motion in all planes Baseline: Thumb palmar abduction  R 42, L 55 and radial abduction  R 45, L 45 -  stiffness in webpace - pain R 6/10 and L 5/10 - R wrist flexion 35 and ext 30  Goal status: Ongoing   2.  AROM in R and L thumb palmar/ radial abduction improved for patient to turn doorknob and use walker pain less than 2/10  Baseline: pain R 6/10 and L 5/10 - decrease thumb PA and RA - R worse than L - see goal 1  Goal status: Ongoing   3.  Wrist AROM and strength increased to [facility] for patient to push and pull door open,  use walker  bath and dress using bilateral hands with symptoms less than 2/10 Baseline: Right wrist flexion extension  30-35  with pain 6/10 pain .  Pain L thumb and radial wrist 5/10  Goal status: Ongoing   4.  Grip and prehension strength increased to without range for her age to use hands with symptoms less than 2/10 in ADLS  Baseline: Right grip 25 pounds and left 25  pounds, lateral pinch right 8 pounds and left 8 pounds and 3-point pinch right 7 pound and left 6  pounds.  - pain 5-6/10 pain -  Goal status: Ongoing   5: Pt to verbalize and demo 3 modifications, adaptive equipment or joint protection to implement at home to decrease pain to less than 2/10 in bilateral hands. BASELINE: Patient no knowledge of adaptations or modifications.  Pain increased 5-6/10 in bilateral hands. GOAL STATUS: Ongoing  ASSESSMENT:  CLINICAL IMPRESSION: Patient seen for occupational therapy evaluation for bilateral hand pain with a diagnosis of carpal tunnel symptoms as well as CMC thumb OA.  Patient present with pain 6/10 in the right thumb CMC as well as radial wrist over first dorsal compartment.  Patient with increased tightness and tenderness over right webspace with limited thumb palmar radial abduction as well as wrist flexion and extension.  Left wrist active range of motion within functional limits as well as thumb palmar abduction.  Limited left thumb radial abduction.  Appear patient had increased symptoms for more than 6 months to a year but increased in the last 3 months after using a walker because of knee and foot pain. Pt continues to demonstrate and report increased pain and tenderness over first dorsal compartment on the right with increased pain with wrist flexion and radial deviation with thumb radial abduction.  Right worse than left.  Patient with a prefab thumb spica on the right.  Pt did not wish to continue with iontophoresis treatment, reports she did not like the feeling of it in the  past, will defer for now.  Continues with increased pain and tenderness throughout hand and thumb.   Recommend pt use heat at home and perform exercises prior to engaging in daily bread making task.  She will monitor how she is using her hand during bread making to see if we can help modify task.  Pt did not apply red foam to her tools/utensils, reviewed this again for increasing surface area of objects and gripping patterns.  She did demonstrate increased web space motion this date with reaching and grasping of functional items.   Pt continues to  benefit from skilled OT services to decrease pain as well as increase motion and strength to increase independence in functional use of bilateral hands.  Patient in need of continued education for modifications and joint protection as well as adaptive equipment to decrease strain on hands in ADLs and IADLs.  PERFORMANCE DEFICITS: in functional skills including ADLs, IADLs, ROM, strength,  pain, flexibility, decreased knowledge of use of DME, and UE functional use,   and psychosocial skills including environmental adaptation and routines and behaviors.   IMPAIRMENTS: are limiting patient from ADLs, IADLs, rest and sleep, play, leisure, and social participation.   COMORBIDITIES: has no other co-morbidities that affects occupational performance. Patient will benefit from skilled OT to address above impairments and improve overall function.  MODIFICATION OR ASSISTANCE TO COMPLETE EVALUATION: several modification of tasks or assist necessary to complete an evaluation.  OT OCCUPATIONAL PROFILE AND HISTORY: Problem focused assessment: Including review of records relating to presenting problem.  CLINICAL DECISION MAKING: Medium treatment options,  task modification necessary to decrease pain   REHAB POTENTIAL: Good for goals  EVALUATION COMPLEXITY: Medium  PLAN:  OT FREQUENCY: 1 -2 x/week  OT DURATION: 6 weeks PLANNING:  Splinting;Paraffin;Fluidtherapy;Contrast Bath;DME and/or AE instruction;Manual Therapy;Passive range of motion;Scar mobilization;Therapeutic activities, Ultrasound, Iontophoresis, There ex, pt education  Ryden Wainer, OTR/L,CLT 05/17/2023, 7:53 PM

## 2023-05-23 ENCOUNTER — Encounter: Payer: Medicare HMO | Admitting: Physical Therapy

## 2023-05-24 ENCOUNTER — Ambulatory Visit: Payer: Medicare HMO | Attending: Internal Medicine | Admitting: Physical Therapy

## 2023-05-24 ENCOUNTER — Ambulatory Visit: Payer: Medicare HMO | Admitting: Occupational Therapy

## 2023-05-24 DIAGNOSIS — M79641 Pain in right hand: Secondary | ICD-10-CM | POA: Diagnosis not present

## 2023-05-24 DIAGNOSIS — M25531 Pain in right wrist: Secondary | ICD-10-CM

## 2023-05-24 DIAGNOSIS — M6281 Muscle weakness (generalized): Secondary | ICD-10-CM | POA: Diagnosis not present

## 2023-05-24 DIAGNOSIS — M79642 Pain in left hand: Secondary | ICD-10-CM | POA: Diagnosis not present

## 2023-05-24 DIAGNOSIS — M25641 Stiffness of right hand, not elsewhere classified: Secondary | ICD-10-CM

## 2023-05-24 DIAGNOSIS — R262 Difficulty in walking, not elsewhere classified: Secondary | ICD-10-CM | POA: Diagnosis not present

## 2023-05-24 DIAGNOSIS — R2689 Other abnormalities of gait and mobility: Secondary | ICD-10-CM | POA: Insufficient documentation

## 2023-05-24 DIAGNOSIS — M25631 Stiffness of right wrist, not elsewhere classified: Secondary | ICD-10-CM | POA: Diagnosis not present

## 2023-05-24 DIAGNOSIS — Z961 Presence of intraocular lens: Secondary | ICD-10-CM | POA: Diagnosis not present

## 2023-05-24 DIAGNOSIS — E113293 Type 2 diabetes mellitus with mild nonproliferative diabetic retinopathy without macular edema, bilateral: Secondary | ICD-10-CM | POA: Diagnosis not present

## 2023-05-24 DIAGNOSIS — H43813 Vitreous degeneration, bilateral: Secondary | ICD-10-CM | POA: Diagnosis not present

## 2023-05-24 NOTE — Therapy (Signed)
 OUTPATIENT PHYSICAL THERAPY LOWER EXTREMITY TREATMENT   Patient Name: Christine Zimmerman MRN: [id number] DOB:19-Dec-1945, 78 y.o., female Today's Date: 05/24/2023  END OF SESSION:  PT End of Session - 05/24/23 1002     Visit Number 3    Number of Visits 22    Date for PT Re-Evaluation 07/18/23    Authorization Type 11/18-1/31/24  Authorization #[id number]  [facility] 2024    Authorization - Visit Number 3    Authorization - Number of Visits 22    Progress Note Due on Visit 10    PT Start Time 0950    PT Stop Time 1030    PT Time Calculation (min) 40 min    Activity Tolerance Patient tolerated treatment well    Behavior During Therapy WFL for tasks assessed/performed               Past Medical History:  Diagnosis Date   Atrial fibrillation (HCC)    CHF (congestive heart failure) (HCC)    Chronic pain    Coronary artery disease    Diabetes mellitus without complication (HCC)    GERD (gastroesophageal reflux disease)    High cholesterol    Hypertension    Hypothyroidism    Presence of permanent cardiac pacemaker    Past Surgical History:  Procedure Laterality Date   BACK SURGERY     CARDIAC CATHETERIZATION Left 10/23/2015   Procedure: Left Heart Cath and Coronary Angiography;  Surgeon: Cara JONETTA Lovelace, MD;  Location: [facility] INVASIVE CV LAB;  Service: Cardiovascular;  Laterality: Left;   COLONOSCOPY N/A 04/29/2022   Procedure: COLONOSCOPY;  Surgeon: Onita Elspeth Sharper, DO;  Location: [facility] ENDOSCOPY;  Service: Gastroenterology;  Laterality: N/A;   COLONOSCOPY WITH PROPOFOL  N/A 01/03/2017   Procedure: COLONOSCOPY WITH PROPOFOL ;  Surgeon: Viktoria Lamar DASEN, MD;  Location: [facility] ENDOSCOPY;  Service: Endoscopy;  Laterality: N/A;   COLONOSCOPY WITH PROPOFOL  N/A 04/12/2022   Procedure: COLONOSCOPY WITH PROPOFOL ;  Surgeon: Onita Elspeth Sharper, DO;  Location: [facility] ENDOSCOPY;  Service: Gastroenterology;  Laterality: N/A;   CORONARY STENT PLACEMENT     x 4 at [facility]. x 1 by  [place] in 2004   INSERT / REPLACE / REMOVE PACEMAKER     LEFT HEART CATH AND CORONARY ANGIOGRAPHY N/A 06/08/2017   Procedure: LEFT HEART CATH AND CORONARY ANGIOGRAPHY;  Surgeon: Lovelace Cara JONETTA, MD;  Location: [facility] INVASIVE CV LAB;  Service: Cardiovascular;  Laterality: N/A;   PACEMAKER IMPLANT N/A 11/03/2021   Procedure: PACEMAKER IMPLANT;  Surgeon: Ammon Blunt, MD;  Location: [facility] INVASIVE CV LAB;  Service: Cardiovascular;  Laterality: N/A;   Patient Active Problem List   Diagnosis Date Noted   Sick sinus syndrome (HCC) 11/03/2021   Weakness generalized 05/09/2019   Chronic diastolic heart failure (HCC) 03/23/2018   HTN (hypertension) 03/23/2018   DM (diabetes mellitus) (HCC) 03/23/2018   SOB (shortness of breath) 02/01/2018   Dizziness 08/01/2017   TIA (transient ischemic attack) 10/17/2015   Hyponatremia 10/17/2015   Anemia 10/17/2015   Chest pain 10/16/2015   Left-sided weakness 10/16/2015   Hypotension 10/16/2015   Bradycardia 10/16/2015   Hypoglycemia 10/16/2015    PCP: Dr. Tamra Leventhal   REFERRING PROVIDER: Dr. Tamra Leventhal   REFERRING DIAG:  M25.569 (ICD-10-CM) - Pain in joint, lower leg  M25.561 (ICD-10-CM) - Pain in right knee  M25.562 (ICD-10-CM) - Pain in left knee  M79.609 (ICD-10-CM) - Pain in limb  M79.604 (ICD-10-CM) - Pain in right leg  M79.605 (ICD-10-CM) - Pain in left  leg    THERAPY DIAG:  No diagnosis found.  Rationale for Evaluation and Treatment: Rehabilitation  ONSET DATE: 04/2019  SUBJECTIVE:   SUBJECTIVE STATEMENT:            Pt states that she continues to do exercises without much difficulty. She continues to experience left knee pain and instability when walking.   PERTINENT HISTORY: Pt reports ongoing knee pain that has been making it difficult for her to move and she has been steadily becoming less mobile. She used to go to water aerobics and that is where she got most of her exercise, but she is afraid of  falling and this keeps her from going to the class at the [facility]. Pt is a diabetic and she has diabetic neuropathy in her feet. She now has to use a single point cane to avoid from falling.  PAIN:  Are you having pain? Yes: NPRS scale: 3-4/10 Pain location: All around knee  Pain description: Achy  Aggravating factors: Walking and standing or basically any weight bearing, stairs  Relieving factors: Vicks helps her go to sleep.   PRECAUTIONS: None  RED FLAGS: None   WEIGHT BEARING RESTRICTIONS: No  FALLS:  Has patient fallen in last 6 months? No  LIVING ENVIRONMENT: Lives with: lives with their spouse Lives in: House/apartment Stairs: Yes: External: 5 steps; on right going up Has following equipment at home: Single point cane and Walker - 2 wheeled  OCCUPATION: Retired   PLOF: Independent  PATIENT GOALS: To be able to walk without an assistive device and to get around her home and upstairs without help   NEXT MD VISIT: January 2025   OBJECTIVE:  Note: Objective measures were completed at Evaluation unless otherwise noted.  VITALS: 197/78 in sitting                 195/78 in sitting   DIAGNOSTIC FINDINGS:   AP and lateral x-rays of the lumbar spine were ordered and personally  reviewed today. These show extensive degenerative disc disease with  extensive osteophyte formation, mild SI arthritis on the AP view, no  scoliosis, pedicles intact. Lateral view shows severe degenerative changes  to the L4-5 disc space with extensive facet arthritis. No  spondylolisthesis, no fracture.  X-ray impression: Extensive degenerative disc and facet arthritis, lumbar  spine.   PATIENT SURVEYS:  FOTO 40 with target of    COGNITION: Overall cognitive status: Within functional limits for tasks assessed     SENSATION: Not tested  MUSCLE LENGTH:  Not performed   POSTURE: rounded shoulders  PALPATION: None performed    LOWER EXTREMITY ROM:  WFL   LOWER EXTREMITY MMT:   (Blank  rows = not tested)  LOWER EXTREMITY SPECIAL TESTS: None performed   FUNCTIONAL TESTS:  5 times sit to stand: 0 reps 30 seconds chair stand test: 0 reps  Timed up and go (TUG): Not tested  2 minute walk test: 170 ft with use of single point cane  10 meter walk test: NT  Dynamic Gait Index: NT  MCTSIB: Condition 1: Avg of 3 trials: NT sec, Condition 2: Avg of 3 trials: NT sec, Condition 3: Avg of 3 trials: NT sec, Condition 4: Avg of 3 trials: NT sec, and Total Score: NT   GAIT: Distance walked: 50 ft  Assistive device utilized: Single point cane Level of assistance: Modified independence Comments: Right sided antalgic gait  TREATMENT DATE:   05/24/23:  NMR: Head turns over 10 meters x 8 -increased lateral sway    Vertical turns over 10 meters x 8  -increased lateral sway  -Left sided antalgic; ongoing decreased step length   THEREX: Nu-Step seat at 9 and resistance of 2 for 5 min  Knee Ext R/L 4/4  Active straight leg raise 2 x 10  Knee Flex AROM R/L 115/115 Increased hamstring tension on medial side of bilateral hamstring Seated HS Stretch 4 x 60 sec    05/17/23: Nu-Step seat at 9 and resistance of 2 for 5 min  TUG: 20 sec with use of SPC; right sided antalgic gait.   10 Meter Walk Test   Gait Speed:    1st trial 19 sec , 2nd trial 22 sec, Avg= 20.5   sec - Normal Gait speed Avg= 0.49 m/sec   >1 m/sec Need Intervention to reduce falls risk  0.12 m/sec improvement represents a statistically significant improvement in gait speed    - <0.4 m/sec - household walker - associated with increased frailty, risk of death, hospitalization or falls;  2-year mortality and morbidity; increased functional impairments, severe  walking disability  - <0.6 m/sec associated with severe impairment; likely that pt is capable only of limited community or  household ambulation; increased likelihood of dependence with ADLs.  - 0.4 - 0.79 m/sec - limited community ambulator - associated with need for intervention to reduce falls; increased risk for LE limitation and death and hospitalization in 1 year; increased dependence for personal care; 2-year mortality and morbidity; increased functional impairments, severe walking disability  DGI: Uses single point can throughout  -Item 1: 1- Moderate Impairment  -Item 2:  1- Moderate Impairment  -Item 3: 2- Mild Impairment   -Item 4: 2- Mild Impairment  -Item 5: 2- Mild Impairment  -Item 6: 0- Severe   -Item 7: 2- Mild Impairment  -Item 8: 1- Moderate Impairment  Total: 11/24    PATIENT EDUCATION:  Education details: Form and technique for correct performance of exercise and explanation about deficits  Person educated: Patient and Spouse Education method: Programmer, Multimedia, Demonstration, Verbal cues, and Handouts Education comprehension: verbalized understanding, returned demonstration, and verbal cues required  HOME EXERCISE PROGRAM: Access Code: [email or web address] URL: [email or web address] Date: 05/24/2023 Prepared by: Toribio Servant  Exercises - Seated Hamstring Stretch  - 3-4 x weekly - 3 reps - 60 sec hold - Active Straight Leg Raise Advanced  - 3-4 x weekly - 3 sets - 10 reps - Sit to Stand  - 3-4 x weekly - 3 sets - 5 reps - Standing Heel Raise with Support  - 3-4 x weekly - 3 sets - 10 reps - Standing March with Counter Support  - 3-4 x weekly - 3 sets - 10 reps  ASSESSMENT:  CLINICAL IMPRESSION: Pt presents for follow up session after initial evaluation for unsteadiness. She continues to present at an increased risk for falls as evidenced by scores on further functional tests. PT continues to encourage pt to utilize single point cane to decrease of falling. Pt exhibits largest dynamic balance deficit in stair negotiation and stepping over obstacle with limited foot clearance. She  was able to complete all exercises without an increase in her right knee pain and with limited UE support. She will continue to benefit from skilled PT to improve LE strength and aerobic endurance to improve mobility and decrease risk of falling while performing activities of daily living like grocery shopping or negotiating stairs.  OBJECTIVE IMPAIRMENTS: Abnormal gait, decreased balance, decreased endurance, difficulty walking, decreased strength, and pain.   ACTIVITY LIMITATIONS: carrying, lifting, bending, standing, squatting, stairs, transfers, and locomotion level  PARTICIPATION LIMITATIONS: meal prep, cleaning, laundry, driving, shopping, community activity, and church  PERSONAL FACTORS: Age, Past/current experiences, Time since onset of injury/illness/exacerbation, and 3+ comorbidities: HTN, T2DM, Afib  are also affecting patient's functional outcome.   REHAB POTENTIAL: Good  CLINICAL DECISION MAKING: Stable/uncomplicated  EVALUATION COMPLEXITY: Low   GOALS: Goals reviewed with patient? No  SHORT TERM GOALS: Target date: 05/23/2023  PT reviewed the following HEP with patient with patient able to demonstrate a set of the following with min cuing for correction needed. PT educated patient on parameters of therex (how/when to inc/decrease intensity, frequency, rep/set range, stretch hold time, and purpose of therex) with verbalized understanding.  Baseline:NT  05/24/23: Performing independently   Goal status: ACHIEVED   2.  Patient will be able to perform a sit to stand without use of upper extremity support from 18 inch seated surface as evidence of improved LE strength.  Baseline: Needs to use hands  Goal status: ONGOING   3. Patient will demonstrate an improvement in DGI score of >=2 pts as evidence of improved dynamic balance to decrease risk of falling (Pardasaney et al, 2012).            Baseline: Needs to use hands  Goal status: ONGOING     LONG TERM GOALS: Target date:  07/18/2023  Patient will have improved function and activity level as evidenced by an increase in FOTO score by 8 points or more.  Baseline: 40 with target of 48  Goal status: ONGOING   2.  Patient will perform five sit to stand repetitions in <=15 secs as evidence of LE strength that shows that this community dwelling older adult that is older is at a decreased risk of falling. (Buatois 2010)   Baseline: 0 reps; needs to use hands  Goal status: ONGOING   3.  Patient will perform >=11 reps for 30 sec chair stands as evidence of improved LE endurance that decreases her risk of falling.  Baseline: 0 reps  Goal status: ONGOING   4.  Patient will improve distance by >=40 ft for improved aerobic endurance and increased mobility to resume yard work outside [employer], 2009).  Baseline: 175 ft  Goal status: ONGOING  5.  Patient will be able to negotiate >=5 stairs without modified independence as evidence of improved mobility and to decrease caregiver burden.  Baseline: Pt is safest using SPC to negotiate stairs  Goal status: Deferred   6. Patient will perform TUG in <13.5 sec as evidence of improved mobility and decreased risk for falling (Shumway-Cook et al., 2000) Baseline: 20 sec  Goal status: ONGOING             7. Patient will demonstrate reduced falls risk as evidenced by Dynamic Gait Index (DGI) >19/24.  Baseline: 20 sec  Goal status: ONGOING    PLAN:  PT FREQUENCY: 1-2x/week  PT DURATION: 10 weeks  PLANNED INTERVENTIONS: 97164- PT Re-evaluation, 97110-Therapeutic exercises, 97530- Therapeutic activity, [id number]- Neuromuscular re-education, 97535- Self Care, 02859- Manual therapy, [phone]- Gait training, [phone]- Orthotic Fit/training, 97014- Electrical stimulation (unattended), [phone]- Electrical stimulation (manual), Patient/Family education, Balance training, Stair training, Dry Needling, Joint mobilization, Joint manipulation, Spinal manipulation, Spinal mobilization,  Vestibular training, DME instructions, Cryotherapy, and Moist heat  PLAN FOR NEXT SESSION:  Stair training. Obstacle step over ankle weights. Continue  with OTAGO exercises.   Toribio Servant PT, DPT  [facility] [address], [place], [address] Phone: [phone]   Fax:  [phone]

## 2023-05-24 NOTE — Therapy (Signed)
 OUTPATIENT OCCUPATIONAL THERAPY ORTHO TREATMENT  Patient Name: Christine Zimmerman MRN: [id number] DOB:1945-07-12, 78 y.o., female  PCP: Dr Sadie MART PROVIDER: Dr Maree  END OF SESSION:  OT End of Session - 05/24/23 1104     Visit Number 7    Number of Visits 10    Date for OT Re-Evaluation 06/13/23    OT Start Time 1032    OT Stop Time 1102    OT Time Calculation (min) 30 min    Activity Tolerance Patient tolerated treatment well    Behavior During Therapy WFL for tasks assessed/performed             Past Medical History:  Diagnosis Date   Atrial fibrillation (HCC)    CHF (congestive heart failure) (HCC)    Chronic pain    Coronary artery disease    Diabetes mellitus without complication (HCC)    GERD (gastroesophageal reflux disease)    High cholesterol    Hypertension    Hypothyroidism    Presence of permanent cardiac pacemaker    Past Surgical History:  Procedure Laterality Date   BACK SURGERY     CARDIAC CATHETERIZATION Left 10/23/2015   Procedure: Left Heart Cath and Coronary Angiography;  Surgeon: Cara JONETTA Lovelace, MD;  Location: [facility] INVASIVE CV LAB;  Service: Cardiovascular;  Laterality: Left;   COLONOSCOPY N/A 04/29/2022   Procedure: COLONOSCOPY;  Surgeon: Onita Elspeth Sharper, DO;  Location: [facility] ENDOSCOPY;  Service: Gastroenterology;  Laterality: N/A;   COLONOSCOPY WITH PROPOFOL  N/A 01/03/2017   Procedure: COLONOSCOPY WITH PROPOFOL ;  Surgeon: Viktoria Lamar DASEN, MD;  Location: [facility] ENDOSCOPY;  Service: Endoscopy;  Laterality: N/A;   COLONOSCOPY WITH PROPOFOL  N/A 04/12/2022   Procedure: COLONOSCOPY WITH PROPOFOL ;  Surgeon: Onita Elspeth Sharper, DO;  Location: [facility] ENDOSCOPY;  Service: Gastroenterology;  Laterality: N/A;   CORONARY STENT PLACEMENT     x 4 at [facility]. x 1 by [place] in 2004   INSERT / REPLACE / REMOVE PACEMAKER     LEFT HEART CATH AND CORONARY ANGIOGRAPHY N/A 06/08/2017   Procedure: LEFT HEART CATH AND CORONARY ANGIOGRAPHY;   Surgeon: Lovelace Cara JONETTA, MD;  Location: [facility] INVASIVE CV LAB;  Service: Cardiovascular;  Laterality: N/A;   PACEMAKER IMPLANT N/A 11/03/2021   Procedure: PACEMAKER IMPLANT;  Surgeon: Ammon Blunt, MD;  Location: [facility] INVASIVE CV LAB;  Service: Cardiovascular;  Laterality: N/A;   Patient Active Problem List   Diagnosis Date Noted   Sick sinus syndrome (HCC) 11/03/2021   Weakness generalized 05/09/2019   Chronic diastolic heart failure (HCC) 03/23/2018   HTN (hypertension) 03/23/2018   DM (diabetes mellitus) (HCC) 03/23/2018   SOB (shortness of breath) 02/01/2018   Dizziness 08/01/2017   TIA (transient ischemic attack) 10/17/2015   Hyponatremia 10/17/2015   Anemia 10/17/2015   Chest pain 10/16/2015   Left-sided weakness 10/16/2015   Hypotension 10/16/2015   Bradycardia 10/16/2015   Hypoglycemia 10/16/2015    ONSET DATE: 3-6 months ago  REFERRING DIAG: Bilateral hand pain, CTS and thumb CMC OA  THERAPY DIAG:  Stiffness of right hand, not elsewhere classified  Stiffness of right wrist, not elsewhere classified  Pain in right wrist  Pain in both hands  Rationale for Evaluation and Treatment: Rehabilitation  SUBJECTIVE:   SUBJECTIVE STATEMENT: Pt reports she has some shooting pains from the base of the thumbs to the elbow at times, random during day.  Report not liking the Ionto -and not wearing splints - gloves feels better during day- denies any numbness or  pins and needles Pt accompanied by: family member  PERTINENT HISTORY: 02/16/23 Dr Maree note: 2. Pain in left CMC joint in patient with history of carpal tunnel syndrome - Wearing therapeutic gloves and night only - Is not interested in more medications   We will order occupational hand therapy with [place]  If the patient does not receive a call to schedule their initial physical therapy appointment, please use the information below to schedule an appointment directly.  [facility] Address: [address] [place], [place], [place] [address] Phone: [phone]  3. Concern for polypharmacy-  Try cutting back on your pantoprazole   4. Pain in left first carpal metacarpal phalangeal joint and bilateral knees likely secondary to osteoarthritis   5. Low back pain with radiating pain down through bilateral lower extremity   6. 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 Bilateral carpal tunnel syndrome status post carpal tunnel injections  8. 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 Wellness Keep up the healthy eating! We recommend a diet consisting of a variety of green leafy vegetables, fruits, nuts and other whole grain food. Avoid ultra-processed food, soda and artifical sweeteners.  - Eats tumeric on a regular basis   PRECAUTIONS: None    WEIGHT BEARING RESTRICTIONS: No  PAIN:  Are you having pain?  4-6/10 pain R  and L first dorsal compartment -  tenderness  and positive Finkelstein   FALLS: Has patient fallen in last 6 months? No  LIVING ENVIRONMENT: Lives with-Spouse and son that is disabled   PATIENT GOALS: I want the pain better in my hands so that I can do things around the house  NEXT MD VISIT: 6 months  OBJECTIVE:  Note: Objective measures were completed at Evaluation unless otherwise noted.  HAND DOMINANCE: Right   UPPER EXTREMITY ROM:     Active ROM Right eval Left eval R/L 03/07/23 05/09/23 R/L  Shoulder flexion      Shoulder abduction      Shoulder adduction      Shoulder extension      Shoulder internal rotation      Shoulder external rotation      Elbow flexion      Elbow extension      Wrist flexion 35 pain radial wrist  75 pain volar wrist  65/75  60/70  Wrist extension 30 pain radial wrist 70 70/70 60/60  Wrist ulnar deviation 12 pain radial wrist  30 24/30 24/28  Wrist radial deviation 15 20 16/20 18/20  Wrist pronation 90 90  90  Wrist supination 80 pain end range  90  90  (Blank rows = not tested)  Active ROM Right eval Left eval  Thumb MCP (0-60)    Thumb IP (0-80)    Thumb Radial abd/add (0-55) 45  45  Thumb Palmar abd/add (0-45) 42 55   Thumb Opposition to Small Finger To 5th  To 5th    Index MCP (0-90)     Index PIP (0-100)     Index DIP (0-70)      Long MCP (0-90)      Long PIP (0-100)      Long DIP (0-70)      Ring MCP (0-90)  Ring PIP (0-100)      Ring DIP (0-70)      Little MCP (0-90)      Little PIP (0-100)      Little DIP (0-70)      (Blank rows = not tested)  HAND FUNCTION: Grip strength: Right: 25 lbs; Left: 25 lbs, Lateral pinch: Right: 8 lbs, Left: 8 lbs, and 3 point pinch: Right: 7 lbs, Left: 6 lbs ( pain at 3 point volar wrist)  COORDINATION: Limited mostly by pain in thumbs and radial wrist R   SENSATION: Denies numbness   EDEMA: none notice  COGNITION: Overall cognitive status: Within functional limits for tasks assessed   OBSERVATIONS: Increase tightness in right webspace limiting palmar radial abduction with increased strain on abductor and extensors of thumb. Tenderness in webspace   TODAY'S TREATMENT:                                                                                                                              DATE: 05/24/23:   Patient  follow up after not being seen for more than week  Continues to report increase pain over the right and left first dorsal compartments- pain shooting into forearms to elbows    Pt prefers paraffin but decrease pain temporary   Upon assessment today :  Tenderness over bilateral 1st dorsal compartment, distal radius Positive Finkelstein bil with tightness Pain in webspace - tightness with PA and RA of thumb Tenderness at L  CMC Pain end range AROM and AAROM for wrist flexion, ext, RD- bilateral over 1st dorsal compartment  Pt could not tolerate Ionto with dexamethazone   PATIENT EDUCATION: Education details: findings of eval and HEP  Person educated: Patient and Spouse Education method: Explanation, Demonstration, Tactile cues, Verbal cues, and Handouts Education comprehension: verbalized understanding, returned demonstration, and needs further education GOALS: Goals reviewed with patient? Yes LONG TERM GOALS: Target date: 6  wks   Patient to be independent in a HEP to decrease pain in bilateral hands  thumb and radial wrist to less than 2/10 to increase thumb  and wrist active range of motion in all planes Baseline: Thumb palmar abduction  R 42, L 55 and radial abduction  R 45, L 45 - stiffness in webpace - pain R 6/10 and L 5/10 - R wrist flexion 35 and ext 30  Goal status: Ongoing   2.  AROM in R and L thumb palmar/ radial abduction improved for patient to turn doorknob and use walker pain less than 2/10  Baseline: pain R 6/10 and L 5/10 - decrease thumb PA and RA - R worse than L - see goal 1  Goal status: Ongoing   3.  Wrist AROM and strength increased to [facility] for patient to push and pull door open, use walker  bath and dress using bilateral hands with symptoms less than 2/10 Baseline: Right wrist flexion extension  30-35  with pain 6/10 pain .  Pain L thumb and radial wrist 5/10  Goal status: Ongoing   4.  Grip and prehension strength increased to without range for her age to use hands with symptoms less than 2/10 in ADLS  Baseline: Right grip 25 pounds and left 25  pounds, lateral pinch right 8 pounds and left 8 pounds and 3-point pinch right 7 pound and left 6  pounds.  - pain 5-6/10 pain -  Goal status: Ongoing   5: Pt to verbalize and demo 3 modifications, adaptive equipment or joint protection to implement at home to decrease pain to less than 2/10 in bilateral hands. BASELINE: Patient no  knowledge of adaptations or modifications.  Pain increased 5-6/10 in bilateral hands. GOAL STATUS: Ongoing  ASSESSMENT:  CLINICAL IMPRESSION: Patient seen for occupational therapy evaluation for bilateral hand pain with a diagnosis of carpal tunnel symptoms as well as CMC thumb OA.  Patient present with pain 6/10 in the right thumb CMC as well as radial wrist over first dorsal compartment.  Patient with increased tightness and tenderness over bil webspace with limited thumb palmar radial abduction bilateral and increase pain end range  wrist flexion ,extension and RD.  Positive Finkelstein and tenderness 1st dorsal compartment. Patient with a prefab thumb spica on the right.  Pt did not wish to continue with iontophoresis treatment, reports she did not like the feeling of it in the past, will defer for now.  Continues with increased pain and tenderness throughout bilateral wrist,  hand and thumb.   Recommend for pt to talk to Dr Sadie next week for referral to DR Kubinski for possible ultrasound guided  shot for 1st dorsal compartment tenosynovitis.  Pt to cont with modifications and joint protection as well as adaptive equipment to decrease strain on hands in ADLs and IADLs.  PERFORMANCE DEFICITS: in functional skills including ADLs, IADLs, ROM, strength, pain, flexibility, decreased knowledge of use of DME, and UE functional use,   and psychosocial skills including environmental adaptation and routines and behaviors.   IMPAIRMENTS: are limiting patient from ADLs, IADLs, rest and sleep, play, leisure, and social participation.   COMORBIDITIES: has no other co-morbidities that affects occupational performance. Patient will benefit from skilled OT to address above impairments and improve overall function.  MODIFICATION OR ASSISTANCE TO COMPLETE EVALUATION: several modification of tasks or assist necessary to complete an evaluation.  OT OCCUPATIONAL PROFILE AND HISTORY: Problem focused assessment:  Including review of records relating to presenting problem.  CLINICAL DECISION MAKING: Medium treatment options,  task modification necessary to decrease pain   REHAB POTENTIAL: Good for goals  EVALUATION COMPLEXITY: Medium  PLAN:  OT FREQUENCY: 1 -2 x/week  OT DURATION: 6 weeks PLANNING: Splinting;Paraffin;Fluidtherapy;Contrast Bath;DME and/or AE instruction;Manual Therapy;Passive range of motion;Scar mobilization;Therapeutic activities, Ultrasound, Iontophoresis, There ex, pt education  Ancel Peters, OTR/L,CLT 05/24/2023, 11:05 AM

## 2023-05-25 ENCOUNTER — Encounter: Payer: Medicare HMO | Admitting: Physical Therapy

## 2023-05-27 DIAGNOSIS — R262 Difficulty in walking, not elsewhere classified: Secondary | ICD-10-CM | POA: Diagnosis not present

## 2023-05-27 DIAGNOSIS — I495 Sick sinus syndrome: Secondary | ICD-10-CM | POA: Diagnosis not present

## 2023-05-27 DIAGNOSIS — M25561 Pain in right knee: Secondary | ICD-10-CM | POA: Diagnosis not present

## 2023-05-27 DIAGNOSIS — I2510 Atherosclerotic heart disease of native coronary artery without angina pectoris: Secondary | ICD-10-CM | POA: Diagnosis not present

## 2023-05-27 DIAGNOSIS — M159 Polyosteoarthritis, unspecified: Secondary | ICD-10-CM | POA: Diagnosis not present

## 2023-05-27 DIAGNOSIS — I10 Essential (primary) hypertension: Secondary | ICD-10-CM | POA: Diagnosis not present

## 2023-05-27 DIAGNOSIS — M79604 Pain in right leg: Secondary | ICD-10-CM | POA: Diagnosis not present

## 2023-05-27 DIAGNOSIS — E1165 Type 2 diabetes mellitus with hyperglycemia: Secondary | ICD-10-CM | POA: Diagnosis not present

## 2023-05-27 DIAGNOSIS — R21 Rash and other nonspecific skin eruption: Secondary | ICD-10-CM | POA: Diagnosis not present

## 2023-06-02 IMAGING — US US EXTREM LOW VENOUS
1 series · 14 of 24 positions shown · non-contrast
Comparison: None.

CLINICAL DATA: Leg pain

EXAM:
BILATERAL LOWER EXTREMITY VENOUS DOPPLER ULTRASOUND
TECHNIQUE: Gray-scale sonography with compression, as well as color and duplex
ultrasound, were performed to evaluate the deep venous system(s)
from the level of the common femoral vein through the popliteal and
proximal calf veins.

[Series 1: us extrem low venous · 0.06mm/px · 14 of 52 slices shown]
[im 1/52]
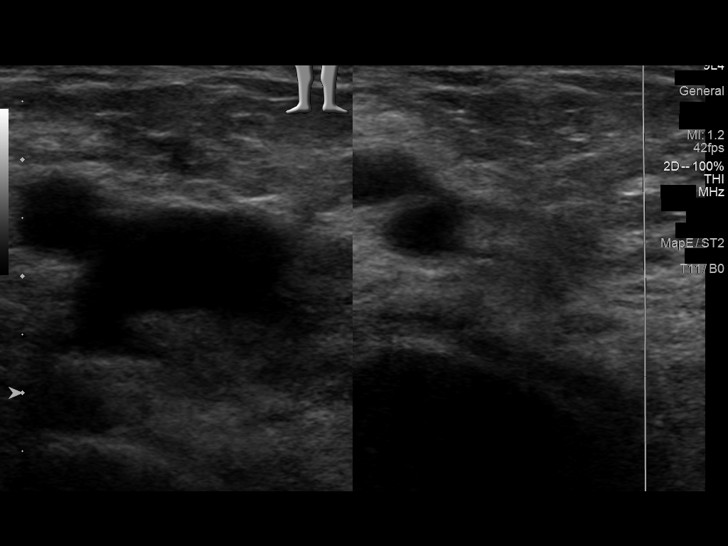
[im 5/52]
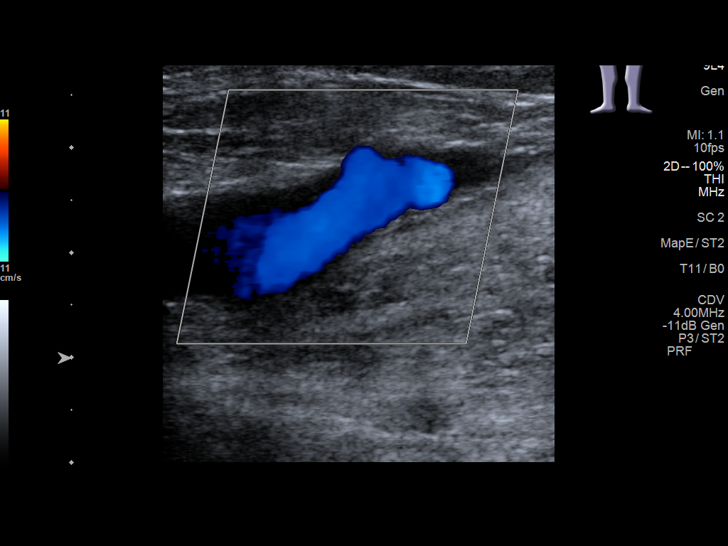
[im 9/52]
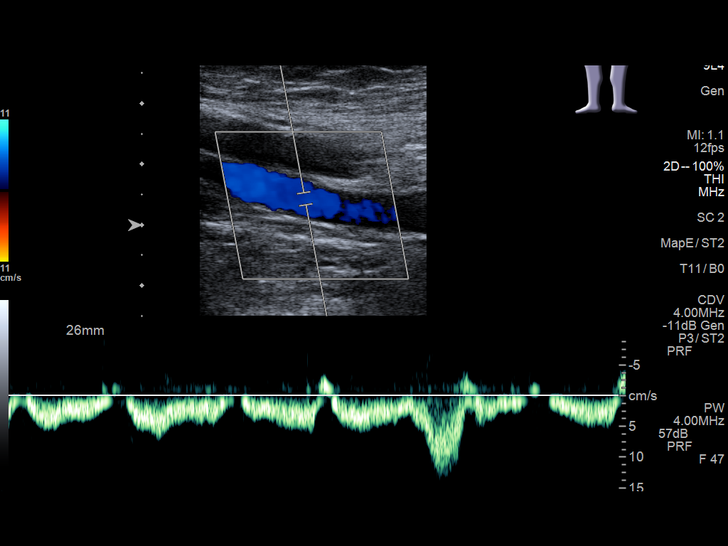
[im 14/52]
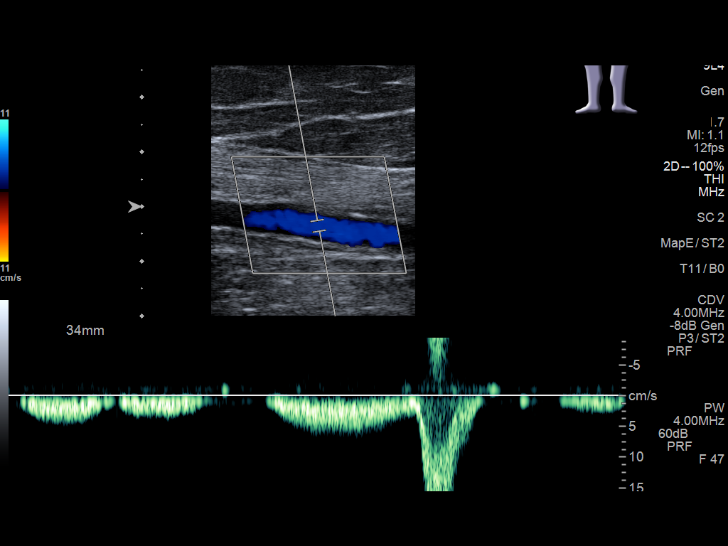
[im 16/52]
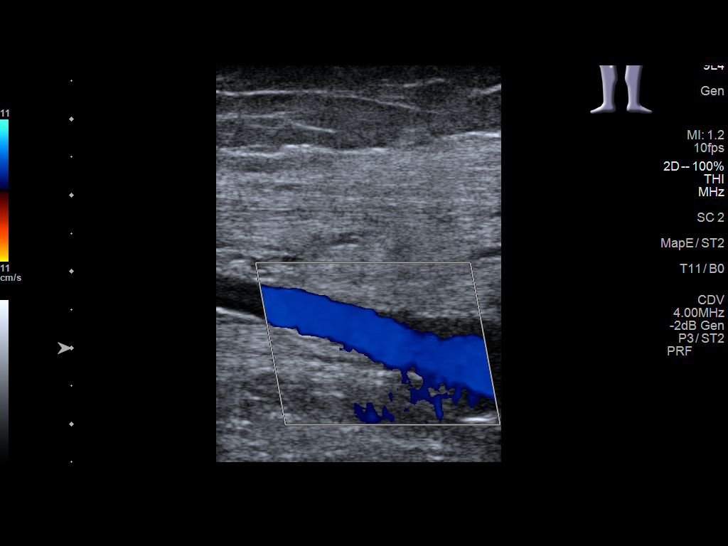
[im 20/52]
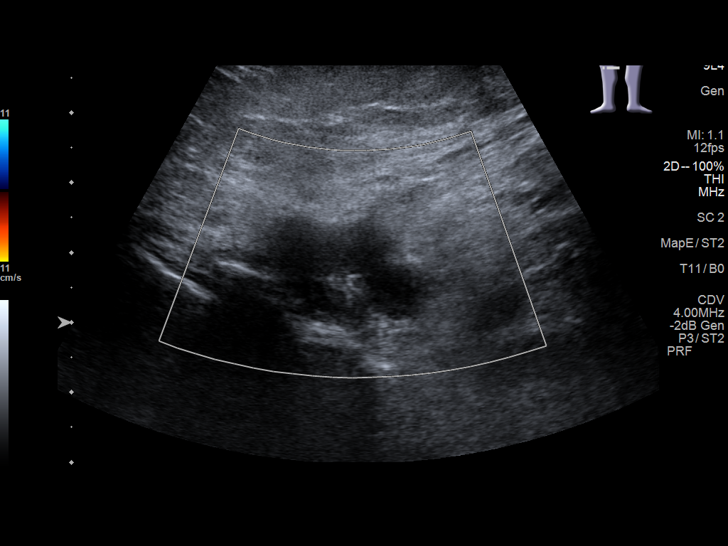
[im 25/52]
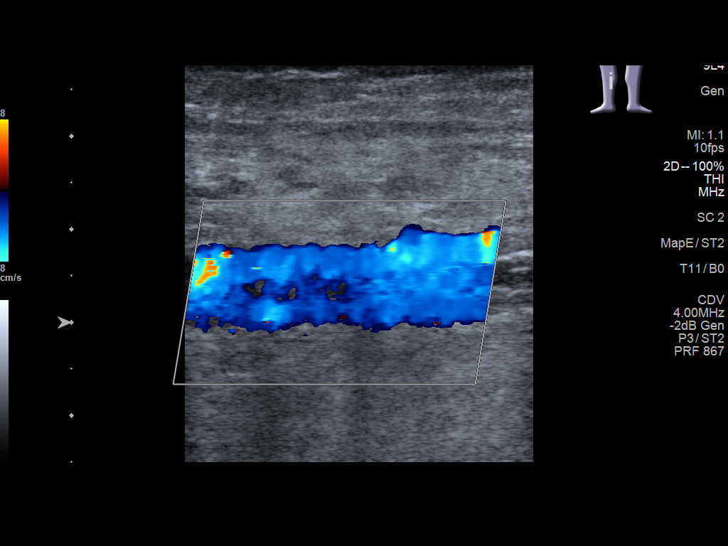
[im 27/52]
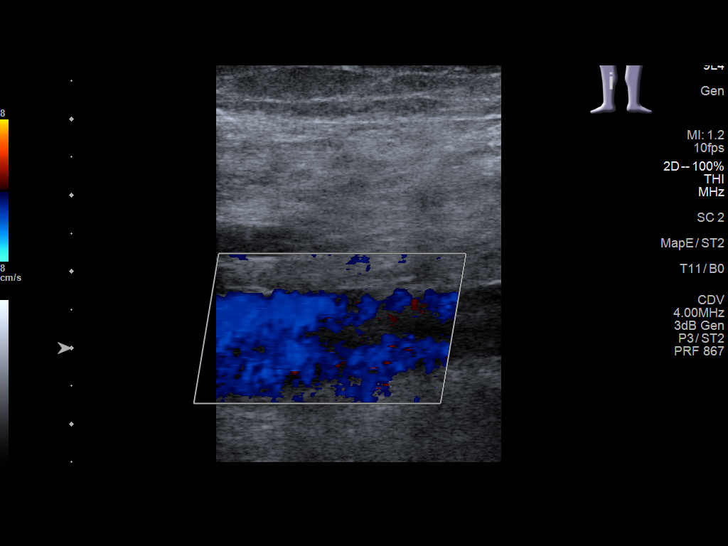
[im 32/52]
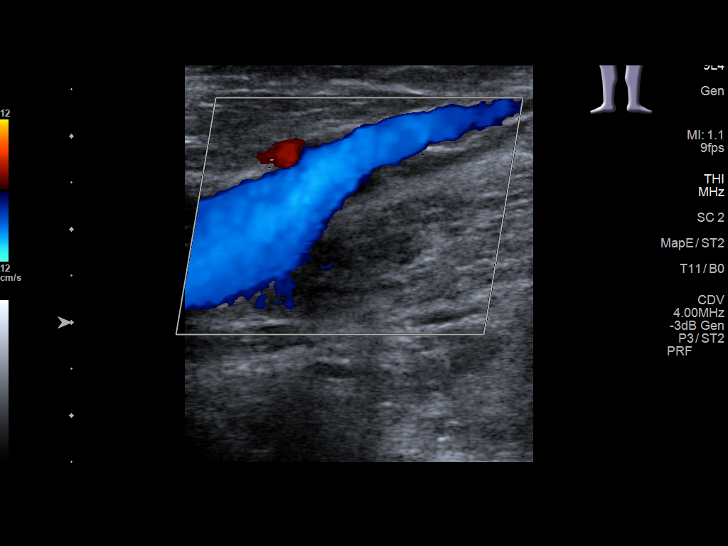
[im 36/52]
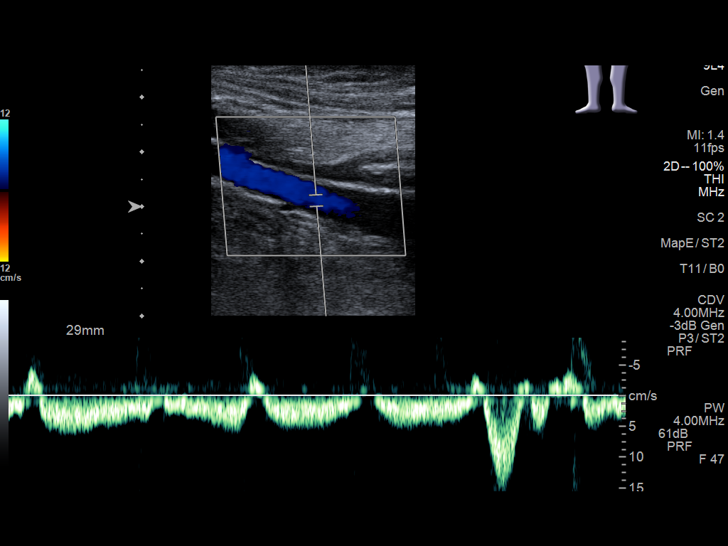
[im 40/52]
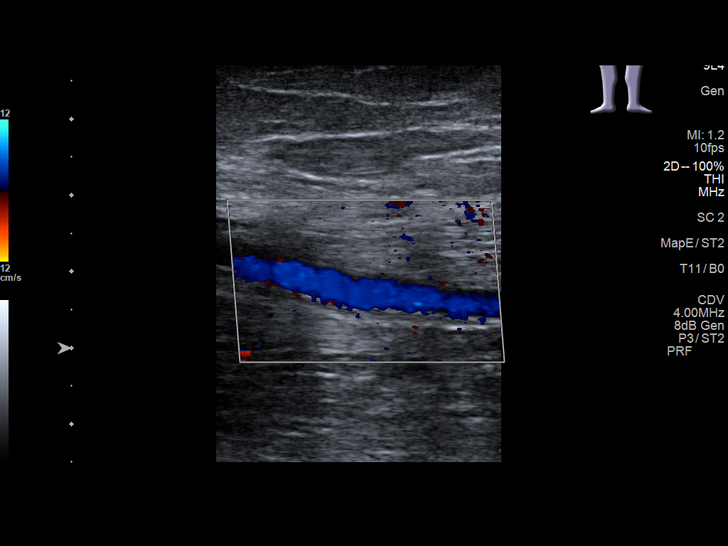
[im 43/52]
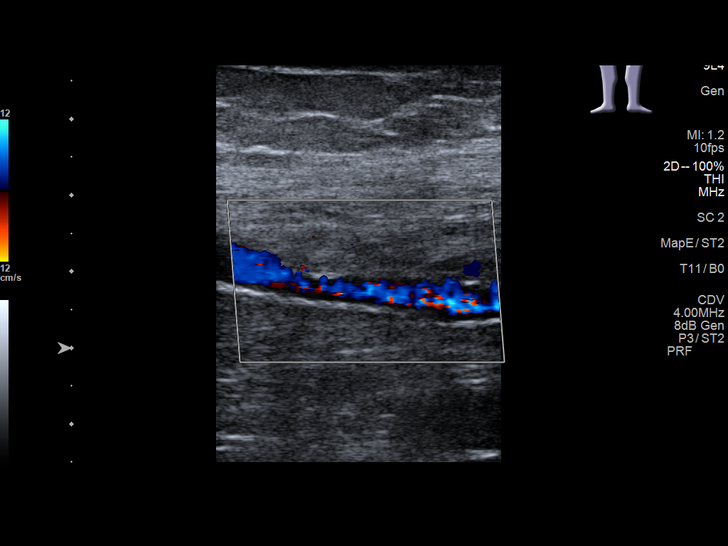
[im 47/52]
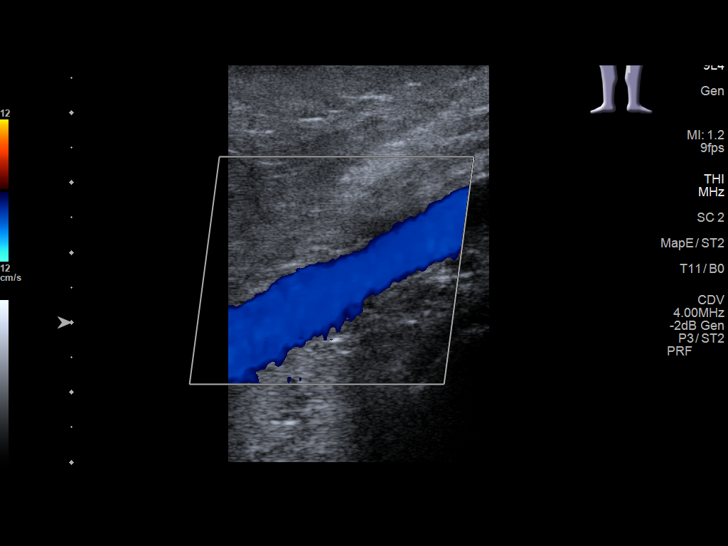
[im 52/52]
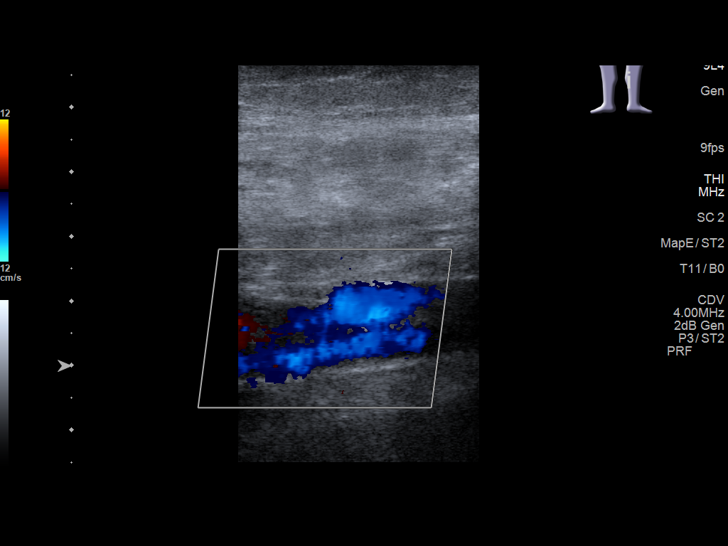

[14 of 24 positions shown; findings below may reference images not displayed]

FINDINGS: VENOUS

Normal compressibility of the common femoral, superficial femoral,
and popliteal veins, as well as the visualized calf veins.
Visualized portions of profunda femoral vein and great saphenous
vein unremarkable. No filling defects to suggest DVT on grayscale or
color Doppler imaging. Doppler waveforms show normal direction of
venous flow, normal respiratory plasticity and response to
augmentation.

OTHER

Right-sided popliteal cyst measuring 5.3 x 1.4 cm.

Limitations: none
IMPRESSION: 1. No DVT identified bilaterally.
2. Right popliteal cyst.

## 2023-06-03 DIAGNOSIS — R262 Difficulty in walking, not elsewhere classified: Secondary | ICD-10-CM | POA: Diagnosis not present

## 2023-06-03 DIAGNOSIS — E1140 Type 2 diabetes mellitus with diabetic neuropathy, unspecified: Secondary | ICD-10-CM | POA: Diagnosis not present

## 2023-06-03 DIAGNOSIS — G5603 Carpal tunnel syndrome, bilateral upper limbs: Secondary | ICD-10-CM | POA: Diagnosis not present

## 2023-06-03 DIAGNOSIS — I2510 Atherosclerotic heart disease of native coronary artery without angina pectoris: Secondary | ICD-10-CM | POA: Diagnosis not present

## 2023-06-03 DIAGNOSIS — Z0000 Encounter for general adult medical examination without abnormal findings: Secondary | ICD-10-CM | POA: Diagnosis not present

## 2023-06-03 DIAGNOSIS — E039 Hypothyroidism, unspecified: Secondary | ICD-10-CM | POA: Diagnosis not present

## 2023-06-03 DIAGNOSIS — E1169 Type 2 diabetes mellitus with other specified complication: Secondary | ICD-10-CM | POA: Diagnosis not present

## 2023-06-03 DIAGNOSIS — Z1231 Encounter for screening mammogram for malignant neoplasm of breast: Secondary | ICD-10-CM | POA: Diagnosis not present

## 2023-06-03 DIAGNOSIS — I5030 Unspecified diastolic (congestive) heart failure: Secondary | ICD-10-CM | POA: Diagnosis not present

## 2023-06-03 DIAGNOSIS — E785 Hyperlipidemia, unspecified: Secondary | ICD-10-CM | POA: Diagnosis not present

## 2023-06-03 DIAGNOSIS — E1165 Type 2 diabetes mellitus with hyperglycemia: Secondary | ICD-10-CM | POA: Diagnosis not present

## 2023-06-03 DIAGNOSIS — I110 Hypertensive heart disease with heart failure: Secondary | ICD-10-CM | POA: Diagnosis not present

## 2023-06-03 DIAGNOSIS — E1159 Type 2 diabetes mellitus with other circulatory complications: Secondary | ICD-10-CM | POA: Diagnosis not present

## 2023-06-03 DIAGNOSIS — I152 Hypertension secondary to endocrine disorders: Secondary | ICD-10-CM | POA: Diagnosis not present

## 2023-06-06 ENCOUNTER — Ambulatory Visit: Payer: Medicare HMO | Admitting: Physical Therapy

## 2023-06-06 ENCOUNTER — Encounter: Payer: Self-pay | Admitting: Physical Therapy

## 2023-06-06 DIAGNOSIS — R262 Difficulty in walking, not elsewhere classified: Secondary | ICD-10-CM | POA: Diagnosis not present

## 2023-06-06 DIAGNOSIS — M6281 Muscle weakness (generalized): Secondary | ICD-10-CM | POA: Diagnosis not present

## 2023-06-06 DIAGNOSIS — R2689 Other abnormalities of gait and mobility: Secondary | ICD-10-CM

## 2023-06-06 DIAGNOSIS — M79641 Pain in right hand: Secondary | ICD-10-CM | POA: Diagnosis not present

## 2023-06-06 DIAGNOSIS — M25641 Stiffness of right hand, not elsewhere classified: Secondary | ICD-10-CM | POA: Diagnosis not present

## 2023-06-06 DIAGNOSIS — M25531 Pain in right wrist: Secondary | ICD-10-CM | POA: Diagnosis not present

## 2023-06-06 DIAGNOSIS — M25631 Stiffness of right wrist, not elsewhere classified: Secondary | ICD-10-CM | POA: Diagnosis not present

## 2023-06-06 DIAGNOSIS — M79642 Pain in left hand: Secondary | ICD-10-CM | POA: Diagnosis not present

## 2023-06-06 NOTE — Therapy (Signed)
OUTPATIENT PHYSICAL THERAPY LOWER EXTREMITY TREATMENT   Patient Name: Christine Zimmerman MRN: [id number] DOB:11/24/1945, 78 y.o., female Today's Date: 06/06/2023  END OF SESSION:  PT End of Session - 06/06/23 0824     Visit Number 4    Number of Visits 22    Date for PT Re-Evaluation 07/18/23    Authorization Type 11/18-1/31/24  Authorization #[id number]  [facility] 2024    Authorization - Visit Number 4    Authorization - Number of Visits 22    Progress Note Due on Visit 10    PT Start Time 0820    PT Stop Time 0900    PT Time Calculation (min) 40 min    Activity Tolerance Patient tolerated treatment well    Behavior During Therapy WFL for tasks assessed/performed               Past Medical History:  Diagnosis Date   Atrial fibrillation (HCC)    CHF (congestive heart failure) (HCC)    Chronic pain    Coronary artery disease    Diabetes mellitus without complication (HCC)    GERD (gastroesophageal reflux disease)    High cholesterol    Hypertension    Hypothyroidism    Presence of permanent cardiac pacemaker    Past Surgical History:  Procedure Laterality Date   BACK SURGERY     CARDIAC CATHETERIZATION Left 10/23/2015   Procedure: Left Heart Cath and Coronary Angiography;  Surgeon: Alwyn Pea, MD;  Location: [facility] INVASIVE CV LAB;  Service: Cardiovascular;  Laterality: Left;   COLONOSCOPY N/A 04/29/2022   Procedure: COLONOSCOPY;  Surgeon: Jaynie Collins, DO;  Location: [facility] ENDOSCOPY;  Service: Gastroenterology;  Laterality: N/A;   COLONOSCOPY WITH PROPOFOL N/A 01/03/2017   Procedure: COLONOSCOPY WITH PROPOFOL;  Surgeon: Scot Jun, MD;  Location: [facility] ENDOSCOPY;  Service: Endoscopy;  Laterality: N/A;   COLONOSCOPY WITH PROPOFOL N/A 04/12/2022   Procedure: COLONOSCOPY WITH PROPOFOL;  Surgeon: Jaynie Collins, DO;  Location: [facility] ENDOSCOPY;  Service: Gastroenterology;  Laterality: N/A;   CORONARY STENT PLACEMENT     x 4 at [facility]. x 1 by  [place] in 2004   INSERT / REPLACE / REMOVE PACEMAKER     LEFT HEART CATH AND CORONARY ANGIOGRAPHY N/A 06/08/2017   Procedure: LEFT HEART CATH AND CORONARY ANGIOGRAPHY;  Surgeon: Alwyn Pea, MD;  Location: [facility] INVASIVE CV LAB;  Service: Cardiovascular;  Laterality: N/A;   PACEMAKER IMPLANT N/A 11/03/2021   Procedure: PACEMAKER IMPLANT;  Surgeon: Marcina Millard, MD;  Location: [facility] INVASIVE CV LAB;  Service: Cardiovascular;  Laterality: N/A;   Patient Active Problem List   Diagnosis Date Noted   Sick sinus syndrome (HCC) 11/03/2021   Weakness generalized 05/09/2019   Chronic diastolic heart failure (HCC) 03/23/2018   HTN (hypertension) 03/23/2018   DM (diabetes mellitus) (HCC) 03/23/2018   SOB (shortness of breath) 02/01/2018   Dizziness 08/01/2017   TIA (transient ischemic attack) 10/17/2015   Hyponatremia 10/17/2015   Anemia 10/17/2015   Chest pain 10/16/2015   Left-sided weakness 10/16/2015   Hypotension 10/16/2015   Bradycardia 10/16/2015   Hypoglycemia 10/16/2015    PCP: Dr. Barbette Reichmann   REFERRING PROVIDER: Dr. Barbette Reichmann   REFERRING DIAG:  M25.569 (ICD-10-CM) - Pain in joint, lower leg  M25.561 (ICD-10-CM) - Pain in right knee  M25.562 (ICD-10-CM) - Pain in left knee  M79.609 (ICD-10-CM) - Pain in limb  M79.604 (ICD-10-CM) - Pain in right leg  M79.605 (ICD-10-CM) - Pain in left  leg    THERAPY DIAG:  Difficulty in walking, not elsewhere classified  Imbalance  Muscle weakness (generalized)  Rationale for Evaluation and Treatment: Rehabilitation  ONSET DATE: 04/2019  SUBJECTIVE:   SUBJECTIVE STATEMENT:            Pt reports that she had trouble standing up without use of her hands. She continues to experience pain in her wrists and posterior calf.   PERTINENT HISTORY: Pt reports ongoing knee pain that has been making it difficult for her to move and she has been steadily becoming less mobile. She used to go to water aerobics and  that is where she got most of her exercise, but she is afraid of falling and this keeps her from going to the class at the [facility]. Pt is a diabetic and she has diabetic neuropathy in her feet. She now has to use a single point cane to avoid from falling.  PAIN:  Are you having pain? Yes: NPRS scale: 3-4/10 Pain location: All around knee  Pain description: Achy  Aggravating factors: Walking and standing or basically any weight bearing, stairs  Relieving factors: Vicks helps her go to sleep.   PRECAUTIONS: None  RED FLAGS: None   WEIGHT BEARING RESTRICTIONS: No  FALLS:  Has patient fallen in last 6 months? No  LIVING ENVIRONMENT: Lives with: lives with their spouse Lives in: House/apartment Stairs: Yes: External: 5 steps; on right going up Has following equipment at home: Single point cane and Walker - 2 wheeled  OCCUPATION: Retired   PLOF: Independent  PATIENT GOALS: To be able to walk without an assistive device and to get around her home and upstairs without help   NEXT MD VISIT: January 2025   OBJECTIVE:  Note: Objective measures were completed at Evaluation unless otherwise noted.  VITALS: 197/78 in sitting                 195/78 in sitting   DIAGNOSTIC FINDINGS:   AP and lateral x-rays of the lumbar spine were ordered and personally  reviewed today. These show extensive degenerative disc disease with  extensive osteophyte formation, mild SI arthritis on the AP view, no  scoliosis, pedicles intact. Lateral view shows severe degenerative changes  to the L4-5 disc space with extensive facet arthritis. No  spondylolisthesis, no fracture.  X-ray impression: Extensive degenerative disc and facet arthritis, lumbar  spine.   PATIENT SURVEYS:  FOTO 40 with target of    COGNITION: Overall cognitive status: Within functional limits for tasks assessed     SENSATION: Not tested  MUSCLE LENGTH:  Not performed   POSTURE: rounded shoulders  PALPATION: None performed     LOWER EXTREMITY ROM:  WFL   LOWER EXTREMITY MMT:   (Blank rows = not tested)  LOWER EXTREMITY SPECIAL TESTS: None performed   FUNCTIONAL TESTS:  5 times sit to stand: 0 reps 30 seconds chair stand test: 0 reps  Timed up and go (TUG): Not tested  2 minute walk test: 170 ft with use of single point cane  10 meter walk test: NT  Dynamic Gait Index: NT  MCTSIB: Condition 1: Avg of 3 trials: NT sec, Condition 2: Avg of 3 trials: NT sec, Condition 3: Avg of 3 trials: NT sec, Condition 4: Avg of 3 trials: NT sec, and Total Score: NT   GAIT: Distance walked: 50 ft  Assistive device utilized: Single point cane Level of assistance: Modified independence Comments: Right sided antalgic gait  TREATMENT DATE:   06/06/23: Standing Marches with BUE support 1 x 10  Standing Heel Raises with BUE support 1 x 10  Overground ambulation with use of single point cane 100 ft x 2   -Pt shows decreased step length on RLE  -Pt needs seated rest break due to fatigue  Seated Left Calf Stretch 2 x 60 sec  Long Sitting Right Calf Stretch 2 x 60 sec -Pt provided overpressure on left calf  Overground ambulation with use of single point cane 100 ft x 4   -Pt shows decreased step length on RLE  -Pt needs seated rest break -RPE 6-8/10   10 M x 4 with horizontal head turns   -Pt shows decreased velocity and decreased horizontal turn to right   05/24/23:  NMR: Head turns over 10 meters x 8 -increased lateral sway    Vertical turns over 10 meters x 8  -increased lateral sway  -Left sided antalgic; ongoing decreased step length   THEREX: Nu-Step seat at 9 and resistance of 2 for 5 min  Knee Ext R/L 4/4  Active straight leg raise 2 x 10  Knee Flex AROM R/L 115/115 Increased hamstring tension on medial side of bilateral hamstring Seated HS Stretch 4 x 60 sec     05/17/23: Nu-Step seat at 9 and resistance of 2 for 5 min  TUG: 20 sec with use of SPC; right sided antalgic gait.   10 Meter Walk Test   Gait Speed:    1st trial 19 sec , 2nd trial 22 sec, Avg= 20.5   sec - Normal Gait speed Avg= 0.49 m/sec   >1 m/sec Need Intervention to reduce falls risk  0.12 m/sec improvement represents a statistically significant improvement in gait speed    - <0.4 m/sec - household walker - associated with increased frailty, risk of death, hospitalization or falls;  2-year mortality and morbidity; increased functional impairments, severe  walking disability  - <0.6 m/sec associated with severe impairment; likely that pt is capable only of limited community or household ambulation; increased likelihood of dependence with ADLs.  - 0.4 - 0.79 m/sec - limited community ambulator - associated with need for intervention to reduce falls; increased risk for LE limitation and death and hospitalization in 1 year; increased dependence for personal care; 2-year mortality and morbidity; increased functional impairments, severe walking disability  DGI: Uses single point can throughout  -Item 1: 1- Moderate Impairment  -Item 2:  1- Moderate Impairment  -Item 3: 2- Mild Impairment   -Item 4: 2- Mild Impairment  -Item 5: 2- Mild Impairment  -Item 6: 0- Severe   -Item 7: 2- Mild Impairment  -Item 8: 1- Moderate Impairment  Total: 11/24    PATIENT EDUCATION:  Education details: Form and technique for correct performance of exercise and explanation about deficits  Person educated: Patient and Spouse Education method: Programmer, multimedia, Demonstration, Verbal cues, and Handouts Education comprehension: verbalized understanding, returned demonstration, and verbal cues required  HOME EXERCISE PROGRAM: Access Code: [email or web address] URL: [email or web address] Date: 06/06/2023 Prepared by: Ellin Goodie  Exercises - Long Sitting Calf Stretch with Strap  - 1 x daily  - 3 reps - 60 sec sec  hold - Active Straight Leg Raise Advanced  - 3-4 x weekly - 3 sets - 10 reps - Sit to Stand  - 3-4 x weekly - 3 sets - 5 reps - Standing Heel Raise with Support  - 3-4 x weekly - 3 sets - 10 reps - Standing March  with Counter Support  - 3-4 x weekly - 3 sets - 10 reps  ASSESSMENT:  CLINICAL IMPRESSION: Pt demonstrates ongoing improvement in activity tolerance with highest volume of aerobic exercise to date. She was limited by fatigue during the session needing to take several seated rest breaks after ambulation. She was also limited by left calf pain which is likely increased tension in the muscle. She will continue to benefit from skilled PT to improve LE strength and aerobic endurance to improve mobility and decrease risk of falling while performing activities of daily living like grocery shopping or negotiating stairs.   OBJECTIVE IMPAIRMENTS: Abnormal gait, decreased balance, decreased endurance, difficulty walking, decreased strength, and pain.   ACTIVITY LIMITATIONS: carrying, lifting, bending, standing, squatting, stairs, transfers, and locomotion level  PARTICIPATION LIMITATIONS: meal prep, cleaning, laundry, driving, shopping, community activity, and church  PERSONAL FACTORS: Age, Past/current experiences, Time since onset of injury/illness/exacerbation, and 3+ comorbidities: HTN, T2DM, Afib  are also affecting patient's functional outcome.   REHAB POTENTIAL: Good  CLINICAL DECISION MAKING: Stable/uncomplicated  EVALUATION COMPLEXITY: Low   GOALS: Goals reviewed with patient? No  SHORT TERM GOALS: Target date: 05/23/2023  PT reviewed the following HEP with patient with patient able to demonstrate a set of the following with min cuing for correction needed. PT educated patient on parameters of therex (how/when to inc/decrease intensity, frequency, rep/set range, stretch hold time, and purpose of therex) with verbalized understanding.  Baseline:NT  05/24/23:  Performing independently   Goal status: ACHIEVED   2.  Patient will be able to perform a sit to stand without use of upper extremity support from 18 inch seated surface as evidence of improved LE strength.  Baseline: Needs to use hands  Goal status: ONGOING   3. Patient will demonstrate an improvement in DGI score of >=2 pts as evidence of improved dynamic balance to decrease risk of falling (Pardasaney et al, 2012).            Baseline: Needs to use hands  Goal status: ONGOING     LONG TERM GOALS: Target date: 07/18/2023  Patient will have improved function and activity level as evidenced by an increase in FOTO score by 8 points or more.  Baseline: 40 with target of 48  Goal status: ONGOING   2.  Patient will perform five sit to stand repetitions in <=15 secs as evidence of LE strength that shows that this community dwelling older adult that is older is at a decreased risk of falling. (Buatois 2010)   Baseline: 0 reps; needs to use hands  Goal status: ONGOING   3.  Patient will perform >=11 reps for 30 sec chair stands as evidence of improved LE endurance that decreases her risk of falling.  Baseline: 0 reps  Goal status: ONGOING   4.  Patient will improve distance by >=40 ft for improved aerobic endurance and increased mobility to resume yard work outside [employer], 2009).  Baseline: 175 ft  Goal status: ONGOING  5.  Patient will be able to negotiate >=5 stairs without modified independence as evidence of improved mobility and to decrease caregiver burden.  Baseline: Pt is safest using SPC to negotiate stairs  Goal status: Deferred   6. Patient will perform TUG in <13.5 sec as evidence of improved mobility and decreased risk for falling (Shumway-Cook et al., 2000) Baseline: 20 sec  Goal status: ONGOING             7. Patient will demonstrate reduced  falls risk as evidenced by Dynamic Gait Index (DGI) >19/24.  Baseline: 11/24  Goal status: ONGOING     PLAN:  PT FREQUENCY: 1-2x/week  PT DURATION: 10 weeks  PLANNED INTERVENTIONS: 97164- PT Re-evaluation, 97110-Therapeutic exercises, 97530- Therapeutic activity, 97112- Neuromuscular re-education, 97535- Self Care, 13086- Manual therapy, [phone]- Gait training, [phone]- Orthotic Fit/training, 97014- Electrical stimulation (unattended), [phone]- Electrical stimulation (manual), Patient/Family education, Balance training, Stair training, Dry Needling, Joint mobilization, Joint manipulation, Spinal manipulation, Spinal mobilization, Vestibular training, DME instructions, Cryotherapy, and Moist heat  PLAN FOR NEXT SESSION:  Re-visit balance tasks. Stair training. Obstacle step over ankle weights. Continue with OTAGO exercises.   Ellin Goodie PT, DPT  [facility] [address], [place], [address] Phone: [phone]   Fax:  [phone]

## 2023-06-08 ENCOUNTER — Ambulatory Visit: Payer: Medicare HMO | Admitting: Physical Therapy

## 2023-06-08 DIAGNOSIS — R262 Difficulty in walking, not elsewhere classified: Secondary | ICD-10-CM | POA: Diagnosis not present

## 2023-06-08 DIAGNOSIS — M6281 Muscle weakness (generalized): Secondary | ICD-10-CM | POA: Diagnosis not present

## 2023-06-08 DIAGNOSIS — R2689 Other abnormalities of gait and mobility: Secondary | ICD-10-CM

## 2023-06-08 DIAGNOSIS — M79641 Pain in right hand: Secondary | ICD-10-CM | POA: Diagnosis not present

## 2023-06-08 DIAGNOSIS — M25631 Stiffness of right wrist, not elsewhere classified: Secondary | ICD-10-CM | POA: Diagnosis not present

## 2023-06-08 DIAGNOSIS — M79642 Pain in left hand: Secondary | ICD-10-CM | POA: Diagnosis not present

## 2023-06-08 DIAGNOSIS — M25531 Pain in right wrist: Secondary | ICD-10-CM | POA: Diagnosis not present

## 2023-06-08 DIAGNOSIS — M25641 Stiffness of right hand, not elsewhere classified: Secondary | ICD-10-CM | POA: Diagnosis not present

## 2023-06-08 NOTE — Therapy (Signed)
OUTPATIENT PHYSICAL THERAPY LOWER EXTREMITY TREATMENT   Patient Name: Christine Zimmerman MRN: [id number] DOB:Apr 12, 1946, 78 y.o., female Today's Date: 06/08/2023  END OF SESSION:  PT End of Session - 06/08/23 0918     Visit Number 5    Number of Visits 22    Date for PT Re-Evaluation 07/18/23    Authorization Type 11/18-1/31/24  Authorization #[id number]  [facility] 2024    Authorization - Visit Number 5    Authorization - Number of Visits 22    Progress Note Due on Visit 10    PT Start Time 0915    PT Stop Time 0945    PT Time Calculation (min) 30 min    Activity Tolerance Patient tolerated treatment well    Behavior During Therapy [facility] for tasks assessed/performed                Past Medical History:  Diagnosis Date   Atrial fibrillation (HCC)    CHF (congestive heart failure) (HCC)    Chronic pain    Coronary artery disease    Diabetes mellitus without complication (HCC)    GERD (gastroesophageal reflux disease)    High cholesterol    Hypertension    Hypothyroidism    Presence of permanent cardiac pacemaker    Past Surgical History:  Procedure Laterality Date   BACK SURGERY     CARDIAC CATHETERIZATION Left 10/23/2015   Procedure: Left Heart Cath and Coronary Angiography;  Surgeon: Alwyn Pea, MD;  Location: [facility] INVASIVE CV LAB;  Service: Cardiovascular;  Laterality: Left;   COLONOSCOPY N/A 04/29/2022   Procedure: COLONOSCOPY;  Surgeon: Jaynie Collins, DO;  Location: [facility] ENDOSCOPY;  Service: Gastroenterology;  Laterality: N/A;   COLONOSCOPY WITH PROPOFOL N/A 01/03/2017   Procedure: COLONOSCOPY WITH PROPOFOL;  Surgeon: Scot Jun, MD;  Location: [facility] ENDOSCOPY;  Service: Endoscopy;  Laterality: N/A;   COLONOSCOPY WITH PROPOFOL N/A 04/12/2022   Procedure: COLONOSCOPY WITH PROPOFOL;  Surgeon: Jaynie Collins, DO;  Location: [facility] ENDOSCOPY;  Service: Gastroenterology;  Laterality: N/A;   CORONARY STENT PLACEMENT     x 4 at [facility]. x 1 by  [place] in 2004   INSERT / REPLACE / REMOVE PACEMAKER     LEFT HEART CATH AND CORONARY ANGIOGRAPHY N/A 06/08/2017   Procedure: LEFT HEART CATH AND CORONARY ANGIOGRAPHY;  Surgeon: Alwyn Pea, MD;  Location: [facility] INVASIVE CV LAB;  Service: Cardiovascular;  Laterality: N/A;   PACEMAKER IMPLANT N/A 11/03/2021   Procedure: PACEMAKER IMPLANT;  Surgeon: Marcina Millard, MD;  Location: [facility] INVASIVE CV LAB;  Service: Cardiovascular;  Laterality: N/A;   Patient Active Problem List   Diagnosis Date Noted   Sick sinus syndrome (HCC) 11/03/2021   Weakness generalized 05/09/2019   Chronic diastolic heart failure (HCC) 03/23/2018   HTN (hypertension) 03/23/2018   DM (diabetes mellitus) (HCC) 03/23/2018   SOB (shortness of breath) 02/01/2018   Dizziness 08/01/2017   TIA (transient ischemic attack) 10/17/2015   Hyponatremia 10/17/2015   Anemia 10/17/2015   Chest pain 10/16/2015   Left-sided weakness 10/16/2015   Hypotension 10/16/2015   Bradycardia 10/16/2015   Hypoglycemia 10/16/2015    PCP: Dr. Barbette Reichmann   REFERRING PROVIDER: Dr. Barbette Reichmann   REFERRING DIAG:  M25.569 (ICD-10-CM) - Pain in joint, lower leg  M25.561 (ICD-10-CM) - Pain in right knee  M25.562 (ICD-10-CM) - Pain in left knee  M79.609 (ICD-10-CM) - Pain in limb  M79.604 (ICD-10-CM) - Pain in right leg  M79.605 (ICD-10-CM) - Pain in  left leg    THERAPY DIAG:  Difficulty in walking, not elsewhere classified  Imbalance  Rationale for Evaluation and Treatment: Rehabilitation  ONSET DATE: 04/2019  SUBJECTIVE:   SUBJECTIVE STATEMENT:          Pt states that she continues to have ongoing left foot pain and difficulty with making turns especially to her left side.   PERTINENT HISTORY: Pt reports ongoing knee pain that has been making it difficult for her to move and she has been steadily becoming less mobile. She used to go to water aerobics and that is where she got most of her exercise, but she  is afraid of falling and this keeps her from going to the class at the [facility] Pt is a diabetic and she has diabetic neuropathy in her feet. She now has to use a single point cane to avoid from falling.  PAIN:  Are you having pain? Yes: NPRS scale: 3-4/10 Pain location: All around knee  Pain description: Achy  Aggravating factors: Walking and standing or basically any weight bearing, stairs  Relieving factors: [place]icks helps her go to sleep.   PRECAUTIONS: None  RED FLAGS: None   WEIGHT BEARING RESTRICTIONS: No  FALLS:  Has patient fallen in last 6 months? No  LIVING ENVIRONMENT: Lives with: lives with their spouse Lives in: House/apartment Stairs: Yes: External: 5 steps; on right going up Has following equipment at home: Single point cane and Walker - 2 wheeled  OCCUPATION: Retired   PLOF: Independent  PATIENT GOALS: To be able to walk without an assistive device and to get around her home and upstairs without help   NEXT MD VISIT: January 2025   OBJECTIVE:  Note: Objective measures were completed at Evaluation unless otherwise noted.  VITALS: 197/78 in sitting                 195/78 in sitting   DIAGNOSTIC FINDINGS:   AP and lateral x-rays of the lumbar spine were ordered and personally  reviewed today. These show extensive degenerative disc disease with  extensive osteophyte formation, mild SI arthritis on the AP view, no  scoliosis, pedicles intact. Lateral view shows severe degenerative changes  to the L4-5 disc space with extensive facet arthritis. No  spondylolisthesis, no fracture.  X-ray impression: Extensive degenerative disc and facet arthritis, lumbar  spine.   PATIENT SURVEYS:  FOTO 40 with target of    COGNITION: Overall cognitive status: Within functional limits for tasks assessed     SENSATION: Not tested  MUSCLE LENGTH:  Not performed   POSTURE: rounded shoulders  PALPATION: None performed    LOWER EXTREMITY ROM:  WFL   LOWER EXTREMITY  MMT:   (Blank rows = not tested)  LOWER EXTREMITY SPECIAL TESTS: None performed   FUNCTIONAL TESTS:  5 times sit to stand: 0 reps 30 seconds chair stand test: 0 reps  Timed up and go (TUG): Not tested  2 minute walk test: 170 ft with use of single point cane  10 meter walk test: NT  Dynamic Gait Index: NT  MCTSIB: Condition 1: Avg of 3 trials: NT sec, Condition 2: Avg of 3 trials: NT sec, Condition 3: Avg of 3 trials: NT sec, Condition 4: Avg of 3 trials: NT sec, and Total Score: NT   GAIT: Distance walked: 50 ft  Assistive device utilized: Single point cane Level of assistance: Modified independence Comments: Right sided antalgic gait  TREATMENT DATE:   06/08/23: Use of single point cane throughout  Nu-Step seat at 9 with resistance at 1 for 5 min  Standing Marches with BUE support 1 x 10  Standing Heel Raises with BUE support 1 x 10  10 m overground ambulation horizontal head turns x 10  Figure 8 with cones spaced 10 ft apart  x 10  -Pt reports ongoing left sided heel pain [place] with LLE 1 x 10 -min VC to increase foot arch    06/06/23: Standing Marches with BUE support 1 x 10  Standing Heel Raises with BUE support 1 x 10  Overground ambulation with use of single point cane 100 ft x 2   -Pt shows decreased step length on RLE  -Pt needs seated rest break due to fatigue  Seated Left Calf Stretch 2 x 60 sec  Long Sitting Right Calf Stretch 2 x 60 sec -Pt provided overpressure on left calf  Overground ambulation with use of single point cane 100 ft x 4   -Pt shows decreased step length on RLE  -Pt needs seated rest break -RPE 6-8/10   10 M x 4 with horizontal head turns   -Pt shows decreased velocity and decreased horizontal turn to right   05/24/23:  NMR: Head turns over 10 meters x 8 -increased lateral sway    Vertical turns over 10  meters x 8  -increased lateral sway  -Left sided antalgic; ongoing decreased step length   THEREX: Nu-Step seat at 9 and resistance of 2 for 5 min  Knee Ext R/L 4/4  Active straight leg raise 2 x 10  Knee Flex AROM R/L 115/115 Increased hamstring tension on medial side of bilateral hamstring Seated HS Stretch 4 x 60 sec    05/17/23: Nu-Step seat at 9 and resistance of 2 for 5 min  TUG: 20 sec with use of SPC; right sided antalgic gait.   10 Meter Walk Test   Gait Speed:    1st trial 19 sec , 2nd trial 22 sec, Avg= 20.5   sec - Normal Gait speed Avg= 0.49 m/sec   >1 m/sec Need Intervention to reduce falls risk  0.12 m/sec improvement represents a statistically significant improvement in gait speed    - <0.4 m/sec - household walker - associated with increased frailty, risk of death, hospitalization or falls;  2-year mortality and morbidity; increased functional impairments, severe  walking disability  - <0.6 m/sec associated with severe impairment; likely that pt is capable only of limited community or household ambulation; increased likelihood of dependence with ADLs.  - 0.4 - 0.79 m/sec - limited community ambulator - associated with need for intervention to reduce falls; increased risk for LE limitation and death and hospitalization in 1 year; increased dependence for personal care; 2-year mortality and morbidity; increased functional impairments, severe walking disability  DGI: Uses single point can throughout  -Item 1: 1- Moderate Impairment  -Item 2:  1- Moderate Impairment  -Item 3: 2- Mild Impairment   -Item 4: 2- Mild Impairment  -Item 5: 2- Mild Impairment  -Item 6: 0- Severe   -Item 7: 2- Mild Impairment  -Item 8: 1- Moderate Impairment  Total: 11/24    PATIENT EDUCATION:  Education details: Form and technique for correct performance of exercise and explanation about deficits  Person educated: Patient and Spouse Education method: Explanation, Demonstration,  Verbal cues, and Handouts Education comprehension: verbalized understanding, returned demonstration, and verbal cues required  HOME EXERCISE PROGRAM: Access Code: [email or web address]  URL: [email or web address] Date: 06/08/2023 Prepared by: Ellin Goodie  Exercises - Long Sitting Calf Stretch with Strap  - 1 x daily - 3 reps - 60 sec sec  hold - Active Straight Leg Raise Advanced  - 3-4 x weekly - 3 sets - 10 reps - Sit to Stand  - 3-4 x weekly - 3 sets - 5 reps - Standing Heel Raise with Support  - 3-4 x weekly - 3 sets - 10 reps - Standing March with Counter Support  - 3-4 x weekly - 3 sets - 10 reps - Figure-8 Walking Around [employer]  - 1 x daily - 7 x weekly - 2 sets - 10 reps   ASSESSMENT:  CLINICAL IMPRESSION: Pt continues to be limited by left heel pain when ambulating that is stemming from her plantar fasciitis with pain localized on left heel. She continues to demonstrate left sided antalgic gait with decreased stance time on LLE and decreased step length on RLE. PT modified HEP to include introduction to foot intrinsic exercises to improve foot pain. She will continue to benefit from skilled PT to improve LE strength and aerobic endurance to improve mobility and decrease risk of falling while performing activities of daily living like grocery shopping or negotiating stairs.    OBJECTIVE IMPAIRMENTS: Abnormal gait, decreased balance, decreased endurance, difficulty walking, decreased strength, and pain.   ACTIVITY LIMITATIONS: carrying, lifting, bending, standing, squatting, stairs, transfers, and locomotion level  PARTICIPATION LIMITATIONS: meal prep, cleaning, laundry, driving, shopping, community activity, and church  PERSONAL FACTORS: Age, Past/current experiences, Time since onset of injury/illness/exacerbation, and 3+ comorbidities: HTN, T2DM, Afib  are also affecting patient's functional outcome.   REHAB POTENTIAL: Good  CLINICAL DECISION MAKING:  Stable/uncomplicated  EVALUATION COMPLEXITY: Low   GOALS: Goals reviewed with patient? No  SHORT TERM GOALS: Target date: 05/23/2023  PT reviewed the following HEP with patient with patient able to demonstrate a set of the following with min cuing for correction needed. PT educated patient on parameters of therex (how/when to inc/decrease intensity, frequency, rep/set range, stretch hold time, and purpose of therex) with verbalized understanding.  Baseline:NT  05/24/23: Performing independently   Goal status: ACHIEVED   2.  Patient will be able to perform a sit to stand without use of upper extremity support from 18 inch seated surface as evidence of improved LE strength.  Baseline: Needs to use hands  Goal status: ONGOING   3. Patient will demonstrate an improvement in DGI score of >=2 pts as evidence of improved dynamic balance to decrease risk of falling (Pardasaney et al, 2012).            Baseline: Needs to use hands  Goal status: ONGOING     LONG TERM GOALS: Target date: 07/18/2023  Patient will have improved function and activity level as evidenced by an increase in FOTO score by 8 points or more.  Baseline: 40 with target of 48  Goal status: ONGOING   2.  Patient will perform five sit to stand repetitions in <=15 secs as evidence of LE strength that shows that this community dwelling older adult that is older is at a decreased risk of falling. (Buatois 2010)   Baseline: 0 reps; needs to use hands  Goal status: ONGOING   3.  Patient will perform >=11 reps for 30 sec chair stands as evidence of improved LE endurance that decreases her risk of falling.  Baseline: 0 reps  Goal status: ONGOING   4.  Patient will improve  distance by >=40 ft for improved aerobic endurance and increased mobility to resume yard work outside [employer], 2009).  Baseline: 175 ft  Goal status: ONGOING  5.  Patient will be able to negotiate >=5 stairs without modified independence as  evidence of improved mobility and to decrease caregiver burden.  Baseline: Pt is safest using SPC to negotiate stairs  Goal status: Deferred   6. Patient will perform TUG in <13.5 sec as evidence of improved mobility and decreased risk for falling (Shumway-Cook et al., 2000) Baseline: 20 sec  Goal status: ONGOING             7. Patient will demonstrate reduced falls risk as evidenced by Dynamic Gait Index (DGI) >19/24.  Baseline: 11/24  Goal status: ONGOING    PLAN:  PT FREQUENCY: 1-2x/week  PT DURATION: 10 weeks  PLANNED INTERVENTIONS: 97164- PT Re-evaluation, 97110-Therapeutic exercises, 97530- Therapeutic activity, 97112- Neuromuscular re-education, 97535- Self Care, 95284- Manual therapy, [phone]- Gait training, [phone]- Orthotic Fit/training, 97014- Electrical stimulation (unattended), [phone]- Electrical stimulation (manual), Patient/Family education, Balance training, Stair training, Dry Needling, Joint mobilization, Joint manipulation, Spinal manipulation, Spinal mobilization, Vestibular training, DME instructions, Cryotherapy, and Moist heat  PLAN FOR NEXT SESSION:  Reassess goals. Continue to progress plantar fasciitis exercises. Stair training. Obstacle step over ankle weights. Continue with OTAGO exercises.  Ellin Goodie PT, DPT  [facility] [address] [place], [address] Phone: [phone]   Fax:  [phone]

## 2023-06-21 ENCOUNTER — Ambulatory Visit: Payer: Medicare HMO | Attending: Internal Medicine | Admitting: Physical Therapy

## 2023-06-21 DIAGNOSIS — R262 Difficulty in walking, not elsewhere classified: Secondary | ICD-10-CM | POA: Insufficient documentation

## 2023-06-21 DIAGNOSIS — R2689 Other abnormalities of gait and mobility: Secondary | ICD-10-CM | POA: Insufficient documentation

## 2023-06-21 DIAGNOSIS — M6281 Muscle weakness (generalized): Secondary | ICD-10-CM | POA: Insufficient documentation

## 2023-06-21 NOTE — Therapy (Signed)
 OUTPATIENT PHYSICAL THERAPY LOWER EXTREMITY TREATMENT   Patient Name: Christine Zimmerman MRN: [id number] DOB:[birth year], 78 y.o., female Today's Date: 06/21/2023  END OF SESSION:  PT End of Session - 06/21/23 1124     Visit Number 6    Number of Visits 22    Date for PT Re-Evaluation 07/18/23    Authorization Type 11/18-1/31/24  Authorization #[id number]  [facility] 2024    Authorization - Visit Number 6    Authorization - Number of Visits 22    Progress Note Due on Visit 10    PT Start Time 1120    PT Stop Time 1200    PT Time Calculation (min) 40 min    Activity Tolerance Patient tolerated treatment well    Behavior During Therapy WFL for tasks assessed/performed                 Past Medical History:  Diagnosis Date   Atrial fibrillation (HCC)    CHF (congestive heart failure) (HCC)    Chronic pain    Coronary artery disease    Diabetes mellitus without complication (HCC)    GERD (gastroesophageal reflux disease)    High cholesterol    Hypertension    Hypothyroidism    Presence of permanent cardiac pacemaker    Past Surgical History:  Procedure Laterality Date   BACK SURGERY     CARDIAC CATHETERIZATION Left 10/23/2015   Procedure: Left Heart Cath and Coronary Angiography;  Surgeon: Cara JONETTA Lovelace, MD;  Location: [facility] INVASIVE CV LAB;  Service: Cardiovascular;  Laterality: Left;   COLONOSCOPY N/A 04/29/2022   Procedure: COLONOSCOPY;  Surgeon: Onita Elspeth Sharper, DO;  Location: [facility] ENDOSCOPY;  Service: Gastroenterology;  Laterality: N/A;   COLONOSCOPY WITH PROPOFOL  N/A 01/03/2017   Procedure: COLONOSCOPY WITH PROPOFOL ;  Surgeon: Viktoria Lamar DASEN, MD;  Location: [facility] ENDOSCOPY;  Service: Endoscopy;  Laterality: N/A;   COLONOSCOPY WITH PROPOFOL  N/A 04/12/2022   Procedure: COLONOSCOPY WITH PROPOFOL ;  Surgeon: Onita Elspeth Sharper, DO;  Location: [facility] ENDOSCOPY;  Service: Gastroenterology;  Laterality: N/A;   CORONARY STENT PLACEMENT     x 4 at [facility]. x 1 by  [place] in 2004   INSERT / REPLACE / REMOVE PACEMAKER     LEFT HEART CATH AND CORONARY ANGIOGRAPHY N/A 06/08/2017   Procedure: LEFT HEART CATH AND CORONARY ANGIOGRAPHY;  Surgeon: Lovelace Cara JONETTA, MD;  Location: [facility] INVASIVE CV LAB;  Service: Cardiovascular;  Laterality: N/A;   PACEMAKER IMPLANT N/A 11/03/2021   Procedure: PACEMAKER IMPLANT;  Surgeon: Ammon Blunt, MD;  Location: [facility] INVASIVE CV LAB;  Service: Cardiovascular;  Laterality: N/A;   Patient Active Problem List   Diagnosis Date Noted   Sick sinus syndrome (HCC) 11/03/2021   Weakness generalized 05/09/2019   Chronic diastolic heart failure (HCC) 03/23/2018   HTN (hypertension) 03/23/2018   DM (diabetes mellitus) (HCC) 03/23/2018   SOB (shortness of breath) 02/01/2018   Dizziness 08/01/2017   TIA (transient ischemic attack) 10/17/2015   Hyponatremia 10/17/2015   Anemia 10/17/2015   Chest pain 10/16/2015   Left-sided weakness 10/16/2015   Hypotension 10/16/2015   Bradycardia 10/16/2015   Hypoglycemia 10/16/2015    PCP: Dr. Tamra Leventhal   REFERRING PROVIDER: Dr. Tamra Leventhal   REFERRING DIAG:  M25.569 (ICD-10-CM) - Pain in joint, lower leg  M25.561 (ICD-10-CM) - Pain in right knee  M25.562 (ICD-10-CM) - Pain in left knee  M79.609 (ICD-10-CM) - Pain in limb  M79.604 (ICD-10-CM) - Pain in right leg  M79.605 (ICD-10-CM) - Pain  in left leg    THERAPY DIAG:  No diagnosis found.  Rationale for Evaluation and Treatment: Rehabilitation  ONSET DATE: 04/2019  SUBJECTIVE:   SUBJECTIVE STATEMENT:  Pt reports increased left knee pain to the point where she feels swelling in the back of her knee and she feels imbalance. She reports feeling pain in the posterior portion of the left knee. Past imaging and notes osteoarthritis in both knees.     PERTINENT HISTORY: Pt reports ongoing knee pain that has been making it difficult for her to move and she has been steadily becoming less mobile. She used to go  to water aerobics and that is where she got most of her exercise, but she is afraid of falling and this keeps her from going to the class at the [facility]. Pt is a diabetic and she has diabetic neuropathy in her feet. She now has to use a single point cane to avoid from falling.  PAIN:  Are you having pain? Yes: NPRS scale: 3-4/10 Pain location: All around knee  Pain description: Achy  Aggravating factors: Walking and standing or basically any weight bearing, stairs  Relieving factors: Vicks helps her go to sleep.   PRECAUTIONS: None  RED FLAGS: None   WEIGHT BEARING RESTRICTIONS: No  FALLS:  Has patient fallen in last 6 months? No  LIVING ENVIRONMENT: Lives with: lives with their spouse Lives in: House/apartment Stairs: Yes: External: 5 steps; on right going up Has following equipment at home: Single point cane and Walker - 2 wheeled  OCCUPATION: Retired   PLOF: Independent  PATIENT GOALS: To be able to walk without an assistive device and to get around her home and upstairs without help   NEXT MD VISIT: January 2025   OBJECTIVE:  Note: Objective measures were completed at Evaluation unless otherwise noted.  VITALS: 197/78 in sitting                 195/78 in sitting   DIAGNOSTIC FINDINGS:   AP and lateral x-rays of the lumbar spine were ordered and personally  reviewed today. These show extensive degenerative disc disease with  extensive osteophyte formation, mild SI arthritis on the AP view, no  scoliosis, pedicles intact. Lateral view shows severe degenerative changes  to the L4-5 disc space with extensive facet arthritis. No  spondylolisthesis, no fracture.  X-ray impression: Extensive degenerative disc and facet arthritis, lumbar  spine.   PATIENT SURVEYS:  FOTO 40 with target of    COGNITION: Overall cognitive status: Within functional limits for tasks assessed     SENSATION: Not tested  MUSCLE LENGTH:  Not performed   POSTURE: rounded  shoulders  PALPATION: None performed    LOWER EXTREMITY ROM:  WFL   LOWER EXTREMITY MMT:   (Blank rows = not tested)  LOWER EXTREMITY SPECIAL TESTS: None performed   FUNCTIONAL TESTS:  5 times sit to stand: 0 reps 30 seconds chair stand test: 0 reps  Timed up and go (TUG): Not tested  2 minute walk test: 170 ft with use of single point cane  10 meter walk test: NT  Dynamic Gait Index: NT  MCTSIB: Condition 1: Avg of 3 trials: NT sec, Condition 2: Avg of 3 trials: NT sec, Condition 3: Avg of 3 trials: NT sec, Condition 4: Avg of 3 trials: NT sec, and Total Score: NT   GAIT: Distance walked: 50 ft  Assistive device utilized: Single point cane Level of assistance: Modified independence Comments: Right sided antalgic gait  TREATMENT DATE:   06/21/23: - 200 ft with use of single point cane.   THEREX  Long Arc Quad AROM 2 x 10  Long [employer]  with yellow band 2 x 10  Long [employer] with red band 2 x 10  Matrix Bike with seat at 11 for 2 min  -Pt reports increased knee pain  Nu-Step with seat and arms at 9 for 6 min   SELF-CARE  Discussion about needing to engage with aerobic exercise with Nu-step being best mode of exercise  -Needs 30 min of activity for each day to eventually reach 150 min per week   06/08/23: Use of single point cane throughout  Nu-Step seat at 9 with resistance at 1 for 5 min  Standing Marches with BUE support 1 x 10  Standing Heel Raises with BUE support 1 x 10  10 m overground ambulation horizontal head turns x 10  Figure 8 with cones spaced 10 ft apart  x 10  -Pt reports ongoing left sided heel pain [place] with LLE 1 x 10 -min VC to increase foot arch    06/06/23: Standing Marches with BUE support 1 x 10  Standing Heel Raises with BUE support 1 x 10  Overground ambulation with use of single point cane 100 ft x 2    -Pt shows decreased step length on RLE  -Pt needs seated rest break due to fatigue  Seated Left Calf Stretch 2 x 60 sec  Long Sitting Right Calf Stretch 2 x 60 sec -Pt provided overpressure on left calf  Overground ambulation with use of single point cane 100 ft x 4   -Pt shows decreased step length on RLE  -Pt needs seated rest break -RPE 6-8/10   10 M x 4 with horizontal head turns   -Pt shows decreased velocity and decreased horizontal turn to right   05/24/23:  NMR: Head turns over 10 meters x 8 -increased lateral sway    Vertical turns over 10 meters x 8  -increased lateral sway  -Left sided antalgic; ongoing decreased step length   THEREX: Nu-Step seat at 9 and resistance of 2 for 5 min  Knee Ext R/L 4/4  Active straight leg raise 2 x 10  Knee Flex AROM R/L 115/115 Increased hamstring tension on medial side of bilateral hamstring Seated HS Stretch 4 x 60 sec    05/17/23: Nu-Step seat at 9 and resistance of 2 for 5 min  TUG: 20 sec with use of SPC; right sided antalgic gait.   10 Meter Walk Test   Gait Speed:    1st trial 19 sec , 2nd trial 22 sec, Avg= 20.5   sec - Normal Gait speed Avg= 0.49 m/sec   >1 m/sec Need Intervention to reduce falls risk  0.12 m/sec improvement represents a statistically significant improvement in gait speed    - <0.4 m/sec - household walker - associated with increased frailty, risk of death, hospitalization or falls;  2-year mortality and morbidity; increased functional impairments, severe  walking disability  - <0.6 m/sec associated with severe impairment; likely that pt is capable only of limited community or household ambulation; increased likelihood of dependence with ADLs.  - 0.4 - 0.79 m/sec - limited community ambulator - associated with need for intervention to reduce falls; increased risk for LE limitation and death and hospitalization in 1 year; increased dependence for personal care; 2-year mortality and morbidity;  increased functional impairments, severe walking disability  DGI: Uses single point  can throughout  -Item 1: 1- Moderate Impairment  -Item 2:  1- Moderate Impairment  -Item 3: 2- Mild Impairment   -Item 4: 2- Mild Impairment  -Item 5: 2- Mild Impairment  -Item 6: 0- Severe   -Item 7: 2- Mild Impairment  -Item 8: 1- Moderate Impairment  Total: 11/24    PATIENT EDUCATION:  Education details: Form and technique for correct performance of exercise and explanation about deficits  Person educated: Patient and Spouse Education method: Explanation, Demonstration, Verbal cues, and Handouts Education comprehension: verbalized understanding, returned demonstration, and verbal cues required  HOME EXERCISE PROGRAM: Access Code: [email or web address] URL: [email or web address] Date: 06/08/2023 Prepared by: Toribio Servant  Exercises - Long Sitting Calf Stretch with Strap  - 1 x daily - 3 reps - 60 sec sec  hold - Active Straight Leg Raise Advanced  - 3-4 x weekly - 3 sets - 10 reps - Sit to Stand  - 3-4 x weekly - 3 sets - 5 reps - Standing Heel Raise with Support  - 3-4 x weekly - 3 sets - 10 reps - Standing March with Counter Support  - 3-4 x weekly - 3 sets - 10 reps - Figure-8 Walking Around Cones  - 1 x daily - 7 x weekly - 2 sets - 10 reps   ASSESSMENT:  CLINICAL IMPRESSION: Pt demonstrates an improvement in aerobic endurance as evidence by an increase in distance. Session largely limited by increase in left knee pain and tests deferred until next visit to be able to more accurately assess  LE strength. Pt shows most tolerance for completing aerobic exercise using Nu-Step with least provocation of pain when compared to walking and recumbent bicycle. PT discussed transportation plan with pt's son who works at [employer]. Pt's son has agreed to drive his mother to [facility] for 2 days a week, so she can use Nu-Step for aerobic exercise. She will continue to benefit from skilled PT to  improve LE strength and aerobic endurance to improve mobility and decrease risk of falling while performing activities of daily living like grocery shopping or negotiating stairs.    OBJECTIVE IMPAIRMENTS: Abnormal gait, decreased balance, decreased endurance, difficulty walking, decreased strength, and pain.   ACTIVITY LIMITATIONS: carrying, lifting, bending, standing, squatting, stairs, transfers, and locomotion level  PARTICIPATION LIMITATIONS: meal prep, cleaning, laundry, driving, shopping, community activity, and church  PERSONAL FACTORS: Age, Past/current experiences, Time since onset of injury/illness/exacerbation, and 3+ comorbidities: HTN, T2DM, Afib  are also affecting patient's functional outcome.   REHAB POTENTIAL: Good  CLINICAL DECISION MAKING: Stable/uncomplicated  EVALUATION COMPLEXITY: Low   GOALS: Goals reviewed with patient? No  SHORT TERM GOALS: Target date: 05/23/2023  PT reviewed the following HEP with patient with patient able to demonstrate a set of the following with min cuing for correction needed. PT educated patient on parameters of therex (how/when to inc/decrease intensity, frequency, rep/set range, stretch hold time, and purpose of therex) with verbalized understanding.  Baseline:NT  05/24/23: Performing independently   Goal status: ACHIEVED   2.  Patient will be able to perform a sit to stand without use of upper extremity support from 18 inch seated surface as evidence of improved LE strength.  Baseline: Needs to use hands  Goal status: ONGOING   3. Patient will demonstrate an improvement in DGI score of >=2 pts as evidence of improved dynamic balance to decrease risk of falling (Pardasaney et al, 2012).            Baseline:  Needs to use hands  Goal status: ONGOING     LONG TERM GOALS: Target date: 07/18/2023  Patient will have improved function and activity level as evidenced by an increase in FOTO score by 8 points or more.  Baseline: 40 with  target of 48  Goal status: ONGOING   2.  Patient will perform five sit to stand repetitions in <=15 secs as evidence of LE strength that shows that this community dwelling older adult that is older is at a decreased risk of falling. (Buatois 2010)   Baseline: 0 reps; needs to use hands  Goal status: ONGOING   3.  Patient will perform >=11 reps for 30 sec chair stands as evidence of improved LE endurance that decreases her risk of falling.  Baseline: 0 reps  Goal status: ONGOING   4.  Patient will improve distance by >=40 ft for improved aerobic endurance and increased mobility to resume yard work outside [employer], 2009).  Baseline: 175 ft  Goal status: ONGOING  5.  Patient will be able to negotiate >=5 stairs without modified independence as evidence of improved mobility and to decrease caregiver burden.  Baseline: Pt is safest using SPC to negotiate stairs  Goal status: Deferred   6. Patient will perform TUG in <13.5 sec as evidence of improved mobility and decreased risk for falling (Shumway-Cook et al., 2000) Baseline: 20 sec  Goal status: ONGOING             7. Patient will demonstrate reduced falls risk as evidenced by Dynamic Gait Index (DGI) >19/24.  Baseline: 11/24  Goal status: ONGOING    PLAN:  PT FREQUENCY: 1-2x/week  PT DURATION: 10 weeks  PLANNED INTERVENTIONS: 97164- PT Re-evaluation, 97110-Therapeutic exercises, 97530- Therapeutic activity, 97112- Neuromuscular re-education, 97535- Self Care, [id number]- Manual therapy, [phone]- Gait training, [phone]- Orthotic Fit/training, 97014- Electrical stimulation (unattended), [phone]- Electrical stimulation (manual), Patient/Family education, Balance training, Stair training, Dry Needling, Joint mobilization, Joint manipulation, Spinal manipulation, Spinal mobilization, Vestibular training, DME instructions, Cryotherapy, and Moist heat  PLAN FOR NEXT SESSION:  30 sec chair stands and 5xSTS and FOTO. Continue to progress  plantar fasciitis exercises. Stair training. Obstacle step over ankle weights. Continue with OTAGO exercises.  Toribio Servant PT, DPT  [facility] [address] [place], [address] Phone: [phone]   Fax:  [phone]

## 2023-06-23 ENCOUNTER — Ambulatory Visit: Payer: Medicare HMO | Admitting: Physical Therapy

## 2023-06-23 DIAGNOSIS — M6281 Muscle weakness (generalized): Secondary | ICD-10-CM

## 2023-06-23 DIAGNOSIS — R2689 Other abnormalities of gait and mobility: Secondary | ICD-10-CM

## 2023-06-23 DIAGNOSIS — R262 Difficulty in walking, not elsewhere classified: Secondary | ICD-10-CM

## 2023-06-23 NOTE — Therapy (Signed)
 OUTPATIENT PHYSICAL THERAPY LOWER EXTREMITY TREATMENT   Patient Name: Christine Zimmerman MRN: [id number] DOB:04-Jul-1945, 78 y.o., female Today's Date: 06/23/2023  END OF SESSION:  PT End of Session - 06/23/23 0946     Visit Number 7    Number of Visits 18    Date for PT Re-Evaluation 07/18/23    Authorization Type [employer] 2025    Authorization Time Period auth 18 visits 1/28-4/1 auth# [id number]    Authorization - Visit Number 3    Authorization - Number of Visits 18    Progress Note Due on Visit 10    PT Start Time 0820    PT Stop Time 0900    PT Time Calculation (min) 40 min    Activity Tolerance Patient tolerated treatment well    Behavior During Therapy WFL for tasks assessed/performed                  Past Medical History:  Diagnosis Date   Atrial fibrillation (HCC)    CHF (congestive heart failure) (HCC)    Chronic pain    Coronary artery disease    Diabetes mellitus without complication (HCC)    GERD (gastroesophageal reflux disease)    High cholesterol    Hypertension    Hypothyroidism    Presence of permanent cardiac pacemaker    Past Surgical History:  Procedure Laterality Date   BACK SURGERY     CARDIAC CATHETERIZATION Left 10/23/2015   Procedure: Left Heart Cath and Coronary Angiography;  Surgeon: Cara JONETTA Lovelace, MD;  Location: [facility] INVASIVE CV LAB;  Service: Cardiovascular;  Laterality: Left;   COLONOSCOPY N/A 04/29/2022   Procedure: COLONOSCOPY;  Surgeon: Onita Elspeth Sharper, DO;  Location: [facility] ENDOSCOPY;  Service: Gastroenterology;  Laterality: N/A;   COLONOSCOPY WITH PROPOFOL  N/A 01/03/2017   Procedure: COLONOSCOPY WITH PROPOFOL ;  Surgeon: Viktoria Lamar DASEN, MD;  Location: [facility] ENDOSCOPY;  Service: Endoscopy;  Laterality: N/A;   COLONOSCOPY WITH PROPOFOL  N/A 04/12/2022   Procedure: COLONOSCOPY WITH PROPOFOL ;  Surgeon: Onita Elspeth Sharper, DO;  Location: [facility] ENDOSCOPY;  Service: Gastroenterology;  Laterality: N/A;   CORONARY STENT PLACEMENT      x 4 at [facility]. x 1 by [place] in 2004   INSERT / REPLACE / REMOVE PACEMAKER     LEFT HEART CATH AND CORONARY ANGIOGRAPHY N/A 06/08/2017   Procedure: LEFT HEART CATH AND CORONARY ANGIOGRAPHY;  Surgeon: Lovelace Cara JONETTA, MD;  Location: [facility] INVASIVE CV LAB;  Service: Cardiovascular;  Laterality: N/A;   PACEMAKER IMPLANT N/A 11/03/2021   Procedure: PACEMAKER IMPLANT;  Surgeon: Ammon Blunt, MD;  Location: [facility] INVASIVE CV LAB;  Service: Cardiovascular;  Laterality: N/A;   Patient Active Problem List   Diagnosis Date Noted   Sick sinus syndrome (HCC) 11/03/2021   Weakness generalized 05/09/2019   Chronic diastolic heart failure (HCC) 03/23/2018   HTN (hypertension) 03/23/2018   DM (diabetes mellitus) (HCC) 03/23/2018   SOB (shortness of breath) 02/01/2018   Dizziness 08/01/2017   TIA (transient ischemic attack) 10/17/2015   Hyponatremia 10/17/2015   Anemia 10/17/2015   Chest pain 10/16/2015   Left-sided weakness 10/16/2015   Hypotension 10/16/2015   Bradycardia 10/16/2015   Hypoglycemia 10/16/2015    PCP: Dr. Tamra Leventhal   REFERRING PROVIDER: Dr. Tamra Leventhal   REFERRING DIAG:  M25.569 (ICD-10-CM) - Pain in joint, lower leg  M25.561 (ICD-10-CM) - Pain in right knee  M25.562 (ICD-10-CM) - Pain in left knee  M79.609 (ICD-10-CM) - Pain in limb  M79.604 (ICD-10-CM) - Pain  in right leg  M79.605 (ICD-10-CM) - Pain in left leg    THERAPY DIAG:  Difficulty in walking, not elsewhere classified  Imbalance  Muscle weakness (generalized)  Rationale for Evaluation and Treatment: Rehabilitation  ONSET DATE: 04/2019  SUBJECTIVE:   SUBJECTIVE STATEMENT:  Pt reports increased left knee pain to the point where she feels swelling in the back of her knee and she feels imbalance. She reports feeling pain in the posterior portion of the left knee. Past imaging and notes osteoarthritis in both knees.     PERTINENT HISTORY: Pt reports ongoing knee pain that has  been making it difficult for her to move and she has been steadily becoming less mobile. She used to go to water aerobics and that is where she got most of her exercise, but she is afraid of falling and this keeps her from going to the class at the [facility]. Pt is a diabetic and she has diabetic neuropathy in her feet. She now has to use a single point cane to avoid from falling.  PAIN:  Are you having pain? Yes: NPRS scale: 3-4/10 Pain location: All around knee  Pain description: Achy  Aggravating factors: Walking and standing or basically any weight bearing, stairs  Relieving factors: Vicks helps her go to sleep.   PRECAUTIONS: None  RED FLAGS: None   WEIGHT BEARING RESTRICTIONS: No  FALLS:  Has patient fallen in last 6 months? No  LIVING ENVIRONMENT: Lives with: lives with their spouse Lives in: House/apartment Stairs: Yes: External: 5 steps; on right going up Has following equipment at home: Single point cane and Walker - 2 wheeled  OCCUPATION: Retired   PLOF: Independent  PATIENT GOALS: To be able to walk without an assistive device and to get around her home and upstairs without help   NEXT MD VISIT: January 2025   OBJECTIVE:  Note: Objective measures were completed at Evaluation unless otherwise noted.  VITALS: 197/78 in sitting                 195/78 in sitting   DIAGNOSTIC FINDINGS:   AP and lateral x-rays of the lumbar spine were ordered and personally  reviewed today. These show extensive degenerative disc disease with  extensive osteophyte formation, mild SI arthritis on the AP view, no  scoliosis, pedicles intact. Lateral view shows severe degenerative changes  to the L4-5 disc space with extensive facet arthritis. No  spondylolisthesis, no fracture.  X-ray impression: Extensive degenerative disc and facet arthritis, lumbar  spine.   PATIENT SURVEYS:  FOTO 40 with target of    COGNITION: Overall cognitive status: Within functional limits for tasks  assessed     SENSATION: Not tested  MUSCLE LENGTH:  Not performed   POSTURE: rounded shoulders  PALPATION: None performed    LOWER EXTREMITY ROM:  WFL   LOWER EXTREMITY MMT:   (Blank rows = not tested)  LOWER EXTREMITY SPECIAL TESTS: None performed   FUNCTIONAL TESTS:  5 times sit to stand: 0 reps 30 seconds chair stand test: 0 reps  Timed up and go (TUG): Not tested  2 minute walk test: 170 ft with use of single point cane  10 meter walk test: NT  Dynamic Gait Index: NT  MCTSIB: Condition 1: Avg of 3 trials: NT sec, Condition 2: Avg of 3 trials: NT sec, Condition 3: Avg of 3 trials: NT sec, Condition 4: Avg of 3 trials: NT sec, and Total Score: NT   GAIT: Distance walked: 50 ft  Assistive device utilized: Single point cane Level of assistance: Modified independence Comments: Right sided antalgic gait                                                                                                                                 TREATMENT DATE:   06/23/23: THEREX  Nu-Step with seat and arms at 8 with resistance at 3 for 6 min  Overground walking with use of rollator 100 ft Overground walking with use of rollator 10 M x 4   Standing Hip Abduction with BUE  2 x 10  Standing HS curls BUE 2 x 10  Seated HS isometrics  5 sec hold 2 x 10 Overground walking with use of rollator 10 M x 4  Mini-Squat using BUE support 2 x 10  -min VC to decrease depth of squat to avoid increased knee pain   THERAPEUTIC ACTIVITY  Stair training  Step to gait pattern with patient leading with RLE upstairs and LLE downstairs 4 steps x 8 with 1 UE on railing and 1 UE using SPC.  -mod VC to step up with RLE instead of LLE   06/21/23: - 200 ft with use of single point cane.   THEREX  Long Arc Quad AROM 2 x 10  Long [employer]  with yellow band 2 x 10  Long [employer] with red band 2 x 10  Matrix Bike with seat at 11 for 2 min  -Pt reports increased knee pain  Nu-Step with seat and arms at  9 for 6 min   SELF-CARE  Discussion about needing to engage with aerobic exercise with Nu-step being best mode of exercise  -Needs 30 min of activity for each day to eventually reach 150 min per week   06/08/23: Use of single point cane throughout  Nu-Step seat at 9 with resistance at 1 for 5 min  Standing Marches with BUE support 1 x 10  Standing Heel Raises with BUE support 1 x 10  10 m overground ambulation horizontal head turns x 10  Figure 8 with cones spaced 10 ft apart  x 10  -Pt reports ongoing left sided heel pain [place] with LLE 1 x 10 -min VC to increase foot arch    06/06/23: Standing Marches with BUE support 1 x 10  Standing Heel Raises with BUE support 1 x 10  Overground ambulation with use of single point cane 100 ft x 2   -Pt shows decreased step length on RLE  -Pt needs seated rest break due to fatigue  Seated Left Calf Stretch 2 x 60 sec  Long Sitting Right Calf Stretch 2 x 60 sec -Pt provided overpressure on left calf  Overground ambulation with use of single point cane 100 ft x 4   -Pt shows decreased step length on RLE  -Pt needs seated rest break -RPE 6-8/10   10 M x 4 with horizontal head turns   -  Pt shows decreased velocity and decreased horizontal turn to right   05/24/23:  NMR: Head turns over 10 meters x 8 -increased lateral sway    Vertical turns over 10 meters x 8  -increased lateral sway  -Left sided antalgic; ongoing decreased step length   THEREX: Nu-Step seat at 9 and resistance of 2 for 5 min  Knee Ext R/L 4/4  Active straight leg raise 2 x 10  Knee Flex AROM R/L 115/115 Increased hamstring tension on medial side of bilateral hamstring Seated HS Stretch 4 x 60 sec     PATIENT EDUCATION:  Education details: Form and technique for correct performance of exercise and explanation about deficits  Person educated: Patient and Spouse Education method: Programmer, Multimedia, Facilities Manager, Verbal cues, and Handouts Education comprehension:  verbalized understanding, returned demonstration, and verbal cues required  HOME EXERCISE PROGRAM: Access Code: [email or web address] URL: [email or web address] Date: 06/23/2023 Prepared by: Toribio Servant  Exercises - Long Sitting Calf Stretch with Strap  - 1 x daily - 3 reps - 60 sec sec  hold - Active Straight Leg Raise Advanced  - 3-4 x weekly - 3 sets - 10 reps - Standing Hip Abduction with Counter Support  - 3-4 x weekly - 3 sets - 10 reps - Mini Squat with Counter Support  - 3-4 x weekly - 3 sets - 10 reps   ASSESSMENT:  CLINICAL IMPRESSION: Pt shows improvement in activity tolerance with ability to perform an increased volume of exercise. Despite improvement in activity tolerance, she continues to be limited by her left knee pain, which she is reluctant to take Tylenol  for. PT recommends speaking with her physician about recommending other medications to better control her pain or to take her Tylenol  before session to avoid being limited by pain. She was able to successfully perform correct step to gait pattern to negotiate stairs without increased pain provocation of her left knee. PT continued to stress importance of exercise compliance with reduction of exercise to improve pt compliance given her time constraints. She will continue to benefit from skilled PT to improve LE strength and aerobic endurance to improve mobility and decrease risk of falling while performing activities of daily living like grocery shopping or negotiating stairs.    OBJECTIVE IMPAIRMENTS: Abnormal gait, decreased balance, decreased endurance, difficulty walking, decreased strength, and pain.   ACTIVITY LIMITATIONS: carrying, lifting, bending, standing, squatting, stairs, transfers, and locomotion level  PARTICIPATION LIMITATIONS: meal prep, cleaning, laundry, driving, shopping, community activity, and church  PERSONAL FACTORS: Age, Past/current experiences, Time since onset of  injury/illness/exacerbation, and 3+ comorbidities: HTN, T2DM, Afib  are also affecting patient's functional outcome.   REHAB POTENTIAL: Good  CLINICAL DECISION MAKING: Stable/uncomplicated  EVALUATION COMPLEXITY: Low   GOALS: Goals reviewed with patient? No  SHORT TERM GOALS: Target date: 05/23/2023  PT reviewed the following HEP with patient with patient able to demonstrate a set of the following with min cuing for correction needed. PT educated patient on parameters of therex (how/when to inc/decrease intensity, frequency, rep/set range, stretch hold time, and purpose of therex) with verbalized understanding.  Baseline:NT  05/24/23: Performing independently   Goal status: ACHIEVED   2.  Patient will be able to perform a sit to stand without use of upper extremity support from 18 inch seated surface as evidence of improved LE strength.  Baseline: Needs to use hands  Goal status: ONGOING   3. Patient will demonstrate an improvement in DGI score of >=2 pts as evidence of improved dynamic balance to  decrease risk of falling (Pardasaney et al, 2012).            Baseline: 11/24 Goal status: ONGOING     LONG TERM GOALS: Target date: 07/18/2023  Patient will have improved function and activity level as evidenced by an increase in FOTO score by 8 points or more.  Baseline: 40 with target of 48  Goal status: ONGOING   2.  Patient will perform five sit to stand repetitions in <=15 secs as evidence of LE strength that shows that this community dwelling older adult that is older is at a decreased risk of falling. (Buatois 2010)   Baseline: 0 reps; needs to use hands  Goal status: ONGOING   3.  Patient will perform >=11 reps for 30 sec chair stands as evidence of improved LE endurance that decreases her risk of falling.  Baseline: 0 reps  Goal status: ONGOING   4.  Patient will improve distance by >=40 ft for improved aerobic endurance and increased mobility to resume yard work outside  [employer], 2009).  Baseline: 175 ft  Goal status: ONGOING  5.  Patient will be able to negotiate >=5 stairs without modified independence as evidence of improved mobility and to decrease caregiver burden.  Baseline: Pt is safest using SPC to negotiate stairs  Goal status: Deferred   6. Patient will perform TUG in <13.5 sec as evidence of improved mobility and decreased risk for falling (Shumway-Cook et al., 2000) Baseline: 20 sec  Goal status: ONGOING             7. Patient will demonstrate reduced falls risk as evidenced by Dynamic Gait Index (DGI) >19/24.  Baseline: 11/24  Goal status: ONGOING    PLAN:  PT FREQUENCY: 1-2x/week  PT DURATION: 10 weeks  PLANNED INTERVENTIONS: 97164- PT Re-evaluation, 97110-Therapeutic exercises, 97530- Therapeutic activity, 97112- Neuromuscular re-education, 97535- Self Care, 02859- Manual therapy, [id number]- Gait training, [phone]- Orthotic Fit/training, 97014- Electrical stimulation (unattended), [phone]- Electrical stimulation (manual), Patient/Family education, Balance training, Stair training, Dry Needling, Joint mobilization, Joint manipulation, Spinal manipulation, Spinal mobilization, Vestibular training, DME instructions, Cryotherapy, and Moist heat  PLAN FOR NEXT SESSION:  Reassess goals: 30 sec chair stands and 5xSTS and FOTO. Continue to progress plantar fasciitis exercises. Obstacle step over ankle weights. Continue with OTAGO exercises   Toribio Servant PT, DPT  [facility] [address], [place], [address] Phone: [phone]   Fax:  [phone]

## 2023-06-28 ENCOUNTER — Ambulatory Visit: Payer: Medicare HMO | Admitting: Physical Therapy

## 2023-06-28 ENCOUNTER — Encounter: Payer: Self-pay | Admitting: Physical Therapy

## 2023-06-28 DIAGNOSIS — R262 Difficulty in walking, not elsewhere classified: Secondary | ICD-10-CM

## 2023-06-28 DIAGNOSIS — M6281 Muscle weakness (generalized): Secondary | ICD-10-CM

## 2023-06-28 DIAGNOSIS — R2689 Other abnormalities of gait and mobility: Secondary | ICD-10-CM

## 2023-06-28 NOTE — Therapy (Signed)
OUTPATIENT PHYSICAL THERAPY LOWER EXTREMITY TREATMENT   Patient Name: Christine Zimmerman MRN: [id number] DOB:Jan 10, 1946, 78 y.o., female Today's Date: 06/28/2023  END OF SESSION:  PT End of Session - 06/28/23 1350     Visit Number 8    Number of Visits 18    Date for PT Re-Evaluation 07/18/23    Authorization Type [employer] 2025    Authorization Time Period auth 18 visits 1/28-4/1 auth# [id number]    Authorization - Visit Number 8    Authorization - Number of Visits 18    Progress Note Due on Visit 10    PT Start Time 1350    PT Stop Time 1430    PT Time Calculation (min) 40 min    Activity Tolerance Patient tolerated treatment well    Behavior During Therapy WFL for tasks assessed/performed                  Past Medical History:  Diagnosis Date   Atrial fibrillation (HCC)    CHF (congestive heart failure) (HCC)    Chronic pain    Coronary artery disease    Diabetes mellitus without complication (HCC)    GERD (gastroesophageal reflux disease)    High cholesterol    Hypertension    Hypothyroidism    Presence of permanent cardiac pacemaker    Past Surgical History:  Procedure Laterality Date   BACK SURGERY     CARDIAC CATHETERIZATION Left 10/23/2015   Procedure: Left Heart Cath and Coronary Angiography;  Surgeon: Alwyn Pea, MD;  Location: [facility]IVE [facility];  Service: Cardiovascular;  Laterality: Left;   COLONOSCOPY N/A 04/29/2022   Procedure: COLONOSCOPY;  Surgeon: Jaynie Collins, DO;  Location: [facility] ENDOSCOPY;  Service: Gastroenterology;  Laterality: N/A;   COLONOSCOPY WITH PROPOFOL N/A 01/03/2017   Procedure: COLONOSCOPY WITH PROPOFOL;  Surgeon: Scot Jun, MD;  Location: [facility] ENDOSCOPY;  Service: Endoscopy;  Laterality: N/A;   COLONOSCOPY WITH PROPOFOL N/A 04/12/2022   Procedure: COLONOSCOPY WITH PROPOFOL;  Surgeon: Jaynie Collins, DO;  Location: [facility] ENDOSCOPY;  Service: Gastroenterology;  Laterality: N/A;   CORONARY STENT  PLACEMENT     x 4 at [facility]. x 1 by [place] in 2004   INSERT / REPLACE / REMOVE PACEMAKER     LEFT HEART CATH AND CORONARY ANGIOGRAPHY N/A 06/08/2017   Procedure: LEFT HEART CATH AND CORONARY ANGIOGRAPHY;  Surgeon: Alwyn Pea, MD;  Location: [facility] CV [facility];  Service: Cardiovascular;  Laterality: N/A;   PACEMAKER IMPLANT N/A 11/03/2021   Procedure: PACEMAKER IMPLANT;  Surgeon: Marcina Millard, MD;  Location: [facility] CV [facility];  Service: Cardiovascular;  Laterality: N/A;   Patient Active Problem List   Diagnosis Date Noted   Sick sinus syndrome (HCC) 11/03/2021   Weakness generalized 05/09/2019   Chronic diastolic heart failure (HCC) 03/23/2018   HTN (hypertension) 03/23/2018   DM (diabetes mellitus) (HCC) 03/23/2018   SOB (shortness of breath) 02/01/2018   Dizziness 08/01/2017   TIA (transient ischemic attack) 10/17/2015   Hyponatremia 10/17/2015   Anemia 10/17/2015   Chest pain 10/16/2015   Left-sided weakness 10/16/2015   Hypotension 10/16/2015   Bradycardia 10/16/2015   Hypoglycemia 10/16/2015    PCP: Dr. Barbette Reichmann   REFERRING PROVIDER: Dr. Barbette Reichmann   REFERRING DIAG:  M25.569 (ICD-10-CM) - Pain in joint, lower leg  M25.561 (ICD-10-CM) - Pain in right knee  M25.562 (ICD-10-CM) - Pain in left knee  M79.609 (ICD-10-CM) - Pain in limb  M79.604 (ICD-10-CM) - Pain  in right leg  M79.605 (ICD-10-CM) - Pain in left leg    THERAPY DIAG:  Difficulty in walking, not elsewhere classified  Imbalance  Muscle weakness (generalized)  Rationale for Evaluation and Treatment: Rehabilitation  ONSET DATE: 04/2019  SUBJECTIVE:   SUBJECTIVE STATEMENT: Pt states that she felt increased pain in her left knee on Sunday and she is not sure why. She was not able to do much because of the pain.    PERTINENT HISTORY: Pt reports ongoing knee pain that has been making it difficult for her to move and she has been steadily becoming less mobile. She  used to go to water aerobics and that is where she got most of her exercise, but she is afraid of falling and this keeps her from going to the class at the [employer]. Pt is a diabetic and she has diabetic neuropathy in her feet. She now has to use a single point cane to avoid from falling.  PAIN:  Are you having pain? Yes: NPRS scale: 5/10 Pain location: All around left knee  Pain description: Achy  Aggravating factors: Walking and standing or basically any weight bearing, stairs  Relieving factors: Vicks helps her go to sleep.   PRECAUTIONS: None  RED FLAGS: None   WEIGHT BEARING RESTRICTIONS: No  FALLS:  Has patient fallen in last 6 months? No  LIVING ENVIRONMENT: Lives with: lives with their spouse Lives in: House/apartment Stairs: Yes: External: 5 steps; on right going up Has following equipment at home: Single point cane and Walker - 2 wheeled  OCCUPATION: Retired   PLOF: Independent  PATIENT GOALS: To be able to walk without an assistive device and to get around her home and upstairs without help   NEXT MD VISIT: January 2025   OBJECTIVE:  Note: Objective measures were completed at Evaluation unless otherwise noted.  VITALS: 197/78 in sitting                 19 5/78 in sitting   DIAGNOSTIC FINDINGS:   AP and lateral x-rays of the lumbar spine were ordered and personally  reviewed today. These show extensive degenerative disc disease with  extensive osteophyte formation, mild SI arthritis on the AP view, no  scoliosis, pedicles intact. Lateral view shows severe degenerative changes  to the L4-5 disc space with extensive facet arthritis. No  spondylolisthesis, no fracture.  X-ray impression: Extensive degenerative disc and facet arthritis, lumbar  spine.   PATIENT SURVEYS:  FOTO 40 with target of    COGNITION: Overall cognitive status: Within functional limits for tasks assessed     SENSATION: Not tested  MUSCLE LENGTH:  Not performed   POSTURE: rounded  shoulders  PALPATION: None performed    LOWER EXTREMITY ROM:  WFL   LOWER EXTREMITY MMT:   (Blank rows = not tested)  LOWER EXTREMITY SPECIAL TESTS: None performed   FUNCTIONAL TESTS:  5 times sit to stand: 0 reps 30 seconds chair stand test: 0 reps  Timed up and go (TUG): Not tested  2 minute walk test: 170 ft with use of single point cane  10 meter walk test: NT  Dynamic Gait Index: NT  MCTSIB: Condition 1: Avg of 3 trials: NT sec, Condition 2: Avg of 3 trials: NT sec, Condition 3: Avg of 3 trials: NT sec, Condition 4: Avg of 3 trials: NT sec, and Total Score: NT   GAIT: Distance walked: 50 ft  Assistive device utilized: Single point cane Level of assistance: Modified independence Comments: Right  sided antalgic gait                                                                                                                                 TREATMENT DATE:   06/28/23: THEREX  Nu-Step with seat and arms at 8 with resistance at 3 for 5 min  Sit to Stand from 25 inch mat height 1 x 8  Sit to Stand from 22 inch mat height 2 x 8  Plantar fasciitis self massage using lacrosse ball   PHYSICAL PERFORMANCE  5xSTS: 0 reps  30 sec chair stands: 0 reps  : 229 ft with single point cane  FOTO: 39   06/23/23: THEREX  Nu-Step with seat and arms at 8 with resistance at 3 for 6 min  Overground walking with use of rollator 100 ft Overground walking with use of rollator 10 M x 4   Standing Hip Abduction with BUE  2 x 10  Standing HS curls BUE 2 x 10  Seated HS isometrics  5 sec hold 2 x 10 Overground walking with use of rollator 10 M x 4  Mini-Squat using BUE support 2 x 10  -min VC to decrease depth of squat to avoid increased knee pain   THERAPEUTIC ACTIVITY  Stair training  Step to gait pattern with patient leading with RLE upstairs and LLE downstairs 4 steps x 8 with 1 UE on railing and 1 UE using SPC.  -mod VC to step up with RLE instead of LLE   06/21/23: - 200  ft with use of single point cane.   THEREX  Long Arc Quad AROM 2 x 10  Long [employer]  with yellow band 2 x 10  Long [employer] with red band 2 x 10  Matrix Bike with seat at 11 for 2 min  -Pt reports increased knee pain  Nu-Step with seat and arms at 9 for 6 min   SELF-CARE  Discussion about needing to engage with aerobic exercise with Nu-step being best mode of exercise  -Needs 30 min of activity for each day to eventually reach 150 min per week   06/08/23: Use of single point cane throughout  Nu-Step seat at 9 with resistance at 1 for 5 min  Standing Marches with BUE support 1 x 10  Standing Heel Raises with BUE support 1 x 10  10 m overground ambulation horizontal head turns x 10  Figure 8 with cones spaced 10 ft apart  x 10  -Pt reports ongoing left sided heel pain [place] with LLE 1 x 10 -min VC to increase foot arch     PATIENT EDUCATION:  Education details: Form and technique for correct performance of exercise and explanation about deficits  Person educated: Patient and Spouse Education method: Programmer, multimedia, Demonstration, Verbal cues, and Handouts Education comprehension: verbalized understanding, returned demonstration, and verbal cues required  HOME EXERCISE PROGRAM: Access Code: [email or web address] URL: [email or web address] Date: 06/28/2023  Prepared by: Ellin Goodie  Exercises - Long Sitting Calf Stretch with Strap  - 1 x daily - 3 reps - 60 sec sec  hold - Active Straight Leg Raise Advanced  - 3-4 x weekly - 3 sets - 10 reps - Standing Hip Abduction with Counter Support  - 3-4 x weekly - 3 sets - 10 reps - Seated Plantar Fascia Mobilization with Small Ball  - 1 x daily - 7 x weekly - 10 reps - Sit to Stand with Counter Support  - 3-4 x weekly - 3 sets - 8 reps  ASSESSMENT:  CLINICAL IMPRESSION: Pt shows mixed progress towards goals with an improvement in aerobic endurance but no improvement in balance confidence and LE strength. She was not limited by  knee pain during the visit. Pt continues to present at increased risk for falls with balance confidence score below 80% and need to use UE support to stand up from a chair. PT to refocus on LE strengthening within pain free range of motion. She will continue to benefit from skilled PT to improve LE strength and aerobic endurance to improve mobility and decrease risk of falling while performing activities of daily living like grocery shopping or negotiating stairs.   OBJECTIVE IMPAIRMENTS: Abnormal gait, decreased balance, decreased endurance, difficulty walking, decreased strength, and pain.   ACTIVITY LIMITATIONS: carrying, lifting, bending, standing, squatting, stairs, transfers, and locomotion level  PARTICIPATION LIMITATIONS: meal prep, cleaning, laundry, driving, shopping, community activity, and church  PERSONAL FACTORS: Age, Past/current experiences, Time since onset of injury/illness/exacerbation, and 3+ comorbidities: HTN, T2DM, Afib  are also affecting patient's functional outcome.   REHAB POTENTIAL: Good  CLINICAL DECISION MAKING: Stable/uncomplicated  EVALUATION COMPLEXITY: Low   GOALS: Goals reviewed with patient? No  SHORT TERM GOALS: Target date: 05/23/2023  PT reviewed the following HEP with patient with patient able to demonstrate a set of the following with min cuing for correction needed. PT educated patient on parameters of therex (how/when to inc/decrease intensity, frequency, rep/set range, stretch hold time, and purpose of therex) with verbalized understanding.  Baseline:NT  05/24/23: Performing independently   Goal status: ACHIEVED   2.  Patient will be able to perform a sit to stand without use of upper extremity support from 18 inch seated surface as evidence of improved LE strength.  Baseline: Needs to use hands 06/28/23: Needs to use hands still or push against knees  Goal status: ONGOING   3. Patient will demonstrate an improvement in DGI score of >=2 pts as  evidence of improved dynamic balance to decrease risk of falling (Pardasaney et al, 2012).            Baseline: 11/24  Goal status: ONGOING     LONG TERM GOALS: Target date: 07/18/2023  Patient will have improved function and activity level as evidenced by an increase in FOTO score by 8 points or more.  Baseline: 40 with target of 48 06/28/23: 39 with target of 48  Goal status: ONGOING   2.  Patient will perform five sit to stand repetitions in <=15 secs as evidence of LE strength that shows that this community dwelling older adult that is older is at a decreased risk of falling. (Buatois 2010)   Baseline: 0 reps; needs to use hands 06/28/23: 0 reps; needs to use hands Goal status: ONGOING   3.  Patient will perform >=11 reps for 30 sec chair stands as evidence of improved LE endurance that decreases her risk of falling.  Baseline:  0 reps 06/28/23: 0 reps; needs to use hands  Goal status: ONGOING   4.  Patient will improve distance by >=40 ft for improved aerobic endurance and increased mobility to resume yard work outside [employer], 2009).  Baseline: 175 ft 06/28/23: 229 ft  Goal status: ACHIEVED   5.  Patient will be able to negotiate >=5 stairs without modified independence as evidence of improved mobility and to decrease caregiver burden.  Baseline: Pt is safest using SPC to negotiate stairs  Goal status: Deferred   6. Patient will perform TUG in <13.5 sec as evidence of improved mobility and decreased risk for falling (Shumway-Cook et al., 2000) Baseline: 20 sec  Goal status: ONGOING             7. Patient will demonstrate reduced falls risk as evidenced by Dynamic Gait Index (DGI) >19/24.  Baseline: 11/24  Goal status: ONGOING    PLAN:  PT FREQUENCY: 1-2x/week  PT DURATION: 10 weeks  PLANNED INTERVENTIONS: 97164- PT Re-evaluation, 97110-Therapeutic exercises, 97530- Therapeutic activity, 97112- Neuromuscular re-education, 97535- Self Care, 40981- Manual  therapy, [phone]- Gait training, [phone]- Orthotic Fit/training, 97014- Electrical stimulation (unattended), [phone]- Electrical stimulation (manual), Patient/Family education, Balance training, Stair training, Dry Needling, Joint mobilization, Joint manipulation, Spinal manipulation, Spinal mobilization, Vestibular training, DME instructions, Cryotherapy, and Moist heat  PLAN FOR NEXT SESSION:  Dynamic gait index (DGI). Obstacle step over ankle weights. Continue with OTAGO exercises   Ellin Goodie PT, DPT  [facility] [address], [place], [address] Phone: [phone]   Fax:  [phone]

## 2023-06-30 ENCOUNTER — Ambulatory Visit: Payer: Medicare HMO | Admitting: Physical Therapy

## 2023-06-30 DIAGNOSIS — R262 Difficulty in walking, not elsewhere classified: Secondary | ICD-10-CM | POA: Diagnosis not present

## 2023-06-30 DIAGNOSIS — R2689 Other abnormalities of gait and mobility: Secondary | ICD-10-CM

## 2023-06-30 DIAGNOSIS — M6281 Muscle weakness (generalized): Secondary | ICD-10-CM

## 2023-06-30 NOTE — Therapy (Addendum)
 OUTPATIENT PHYSICAL THERAPY LOWER EXTREMITY TREATMENT/ Re-certification  Dates of Reporting: 05/09/23-07/18/23  Patient Name: Christine Zimmerman MRN: [id number] DOB:12-09-1945, 78 y.o., female Today's Date: 06/30/2023  END OF SESSION:  PT End of Session - 06/30/23 1032     Visit Number 9    Number of Visits 18    Date for PT Re-Evaluation 07/18/23    Authorization Type [employer] 2025    Authorization Time Period auth 18 visits 1/28-4/1 auth# [id number]    Authorization - Visit Number 9    Authorization - Number of Visits 18    Progress Note Due on Visit 10    PT Start Time 1025    PT Stop Time 1110    PT Time Calculation (min) 45 min    Activity Tolerance Patient tolerated treatment well    Behavior During Therapy WFL for tasks assessed/performed                   Past Medical History:  Diagnosis Date   Atrial fibrillation (HCC)    CHF (congestive heart failure) (HCC)    Chronic pain    Coronary artery disease    Diabetes mellitus without complication (HCC)    GERD (gastroesophageal reflux disease)    High cholesterol    Hypertension    Hypothyroidism    Presence of permanent cardiac pacemaker    Past Surgical History:  Procedure Laterality Date   BACK SURGERY     CARDIAC CATHETERIZATION Left 10/23/2015   Procedure: Left Heart Cath and Coronary Angiography;  Surgeon: Alwyn Pea, MD;  Location: [facility] INVASIVE CV [facility];  Service: Cardiovascular;  Laterality: Left;   COLONOSCOPY N/A 04/29/2022   Procedure: COLONOSCOPY;  Surgeon: Jaynie Collins, DO;  Location: [facility] ENDOSCOPY;  Service: Gastroenterology;  Laterality: N/A;   COLONOSCOPY WITH PROPOFOL N/A 01/03/2017   Procedure: COLONOSCOPY WITH PROPOFOL;  Surgeon: Scot Jun, MD;  Location: [facility] ENDOSCOPY;  Service: Endoscopy;  Laterality: N/A;   COLONOSCOPY WITH PROPOFOL N/A 04/12/2022   Procedure: COLONOSCOPY WITH PROPOFOL;  Surgeon: Jaynie Collins, DO;  Location: [facility] ENDOSCOPY;  Service:  Gastroenterology;  Laterality: N/A;   CORONARY STENT PLACEMENT     x 4 at [facility]. x 1 by [place] in 2004   INSERT / REPLACE / REMOVE PACEMAKER     LEFT HEART CATH AND CORONARY ANGIOGRAPHY N/A 06/08/2017   Procedure: LEFT HEART CATH AND CORONARY ANGIOGRAPHY;  Surgeon: Alwyn Pea, MD;  Location: [facility] INV[facility] CV [facility];  Service: Cardiovascular;  Laterality: N/A;   PACEMAKER IMPLANT N/A 11/03/2021   Procedure: PACEMAKER IMPLANT;  Surgeon: Marcina Millard, MD;  Location: [facility] INVAS[facility] CV [facility];  Service: Cardiovascular;  Laterality: N/A;   Patient Active Problem List   Diagnosis Date Noted   Sick sinus syndrome (HCC) 11/03/2021   Weakness generalized 05/09/2019   Chronic diastolic heart failure (HCC) 03/23/2018   HTN (hypertension) 03/23/2018   DM (diabetes mellitus) (HCC) 03/23/2018   SOB (shortness of breath) 02/01/2018   Dizziness 08/01/2017   TIA (transient ischemic attack) 10/17/2015   Hyponatremia 10/17/2015   Anemia 10/17/2015   Chest pain 10/16/2015   Left-sided weakness 10/16/2015   Hypotension 10/16/2015   Bradycardia 10/16/2015   Hypoglycemia 10/16/2015    PCP: Dr. Barbette Reichmann   REFERRING PROVIDER: Dr. Barbette Reichmann   REFERRING DIAG:  M25.569 (ICD-10-CM) - Pain in joint, lower leg  M25.561 (ICD-10-CM) - Pain in right knee  M25.562 (ICD-10-CM) - Pain in left knee  M79.609 (ICD-10-CM) - Pain in  limb  M79.604 (ICD-10-CM) - Pain in right leg  M79.605 (ICD-10-CM) - Pain in left leg    THERAPY DIAG:  No diagnosis found.  Rationale for Evaluation and Treatment: Rehabilitation  ONSET DATE: 04/2019  SUBJECTIVE:   SUBJECTIVE STATEMENT: Pt states that she felt increased pain in her left knee on Sunday and she is not sure why. She was not able to do much because of the pain.    PERTINENT HISTORY: Pt reports ongoing knee pain that has been making it difficult for her to move and she has been steadily becoming less mobile. She used to go to water  aerobics and that is where she got most of her exercise, but she is afraid of falling and this keeps her from going to the class at the [employer]. Pt is a diabetic and she has diabetic neuropathy in her feet. She now has to use a single point cane to avoid from falling.  PAIN:  Are you having pain? Yes: NPRS scale: 5/10 Pain location: All around left knee  Pain description: Achy  Aggravating factors: Walking and standing or basically any weight bearing, stairs  Relieving factors: Vicks helps her go to sleep.   PRECAUTIONS: None  RED FLAGS: None   WEIGHT BEARING RESTRICTIONS: No  FALLS:  Has patient fallen in last 6 months? No  LIVING ENVIRONMENT: Lives with: lives with their spouse Lives in: House/apartment Stairs: Yes: External: 5 steps; on right going up Has following equipment at home: Single point cane and Walker - 2 wheeled  OCCUPATION: Retired   PLOF: Independent  PATIENT GOALS: To be able to walk without an assistive device and to get around her home and upstairs without help   NEXT MD VISIT: January 2025   OBJECTIVE:  Note: Objective measures were completed at Evaluation unless otherwise noted.  VITALS: 197/78 in sitting                 19 5/78 in sitting   DIAGNOSTIC FINDINGS:   AP and lateral x-rays of the lumbar spine were ordered and personally  reviewed today. These show extensive degenerative disc disease with  extensive osteophyte formation, mild SI arthritis on the AP view, no  scoliosis, pedicles intact. Lateral view shows severe degenerative changes  to the L4-5 disc space with extensive facet arthritis. No  spondylolisthesis, no fracture.  X-ray impression: Extensive degenerative disc and facet arthritis, lumbar  spine.   PATIENT SURVEYS:  FOTO 40 with target of    COGNITION: Overall cognitive status: Within functional limits for tasks assessed     SENSATION: Not tested  MUSCLE LENGTH:  Not performed   POSTURE: rounded shoulders  PALPATION:  None performed    LOWER EXTREMITY ROM:  WFL   LOWER EXTREMITY MMT:   (Blank rows = not tested)  LOWER EXTREMITY SPECIAL TESTS: None performed   FUNCTIONAL TESTS:  5 times sit to stand: 0 reps 30 seconds chair stand test: 0 reps  Timed up and go (TUG): Not tested  2 minute walk test: 170 ft with use of single point cane  10 meter walk test: NT  Dynamic Gait Index: NT  MCTSIB: Condition 1: Avg of 3 trials: NT sec, Condition 2: Avg of 3 trials: NT sec, Condition 3: Avg of 3 trials: NT sec, Condition 4: Avg of 3 trials: NT sec, and Total Score: NT   GAIT: Distance walked: 50 ft  Assistive device utilized: Single point cane Level of assistance: Modified independence Comments: Right sided antalgic gait  TREATMENT DATE:   06/30/23: THEREX  Nu-Step with seat and arms at 7 with resistance at 3 for 5 min  Sit to Stand at 22 inch with 3 x 8 reps  -min VC to sequence exercise with nose over toes,   PHYSICAL PERFORMANCE  TUG  -Trial 1: 19.00 sec  -Trial 2: 16.14 sec  Average: 17.57 sec   DGI: 15/24  -Only used single point cane for the obstacle step over and the stairs  -Mild Impairment- Head turns and change in gait speed  -Moderate Impairment- Steps  -Severe Impairment- Step overs   NMR  Obstacle step over with 4 ankle weights serving as obstacles 10 M x 10  Standing Marches on LLE 2 x 10  -min VC to avoid compensation of internal and external rotation   06/28/23: THEREX  Nu-Step with seat and arms at 8 with resistance at 3 for 5 min  Sit to Stand from 25 inch mat height 1 x 8  Sit to Stand from 22 inch mat height 2 x 8  Plantar fasciitis self massage using lacrosse ball   PHYSICAL PERFORMANCE  5xSTS: 0 reps  30 sec chair stands: 0 reps  : 229 ft with single point cane  FOTO: 39   06/23/23: THEREX  Nu-Step with seat and arms at 8 with  resistance at 3 for 6 min  Overground walking with use of rollator 100 ft Overground walking with use of rollator 10 M x 4   Standing Hip Abduction with BUE  2 x 10  Standing HS curls BUE 2 x 10  Seated HS isometrics  5 sec hold 2 x 10 Overground walking with use of rollator 10 M x 4  Mini-Squat using BUE support 2 x 10  -min VC to decrease depth of squat to avoid increased knee pain   THERAPEUTIC ACTIVITY  Stair training  Step to gait pattern with patient leading with RLE upstairs and LLE downstairs 4 steps x 8 with 1 UE on railing and 1 UE using SPC.  -mod VC to step up with RLE instead of LLE   06/21/23: - 200 ft with use of single point cane.   THEREX  Long Arc Quad AROM 2 x 10  Long [employer]  with yellow band 2 x 10  Long [employer] with red band 2 x 10  Matrix Bike with seat at 11 for 2 min  -Pt reports increased knee pain  Nu-Step with seat and arms at 9 for 6 min   SELF-CARE  Discussion about needing to engage with aerobic exercise with Nu-step being best mode of exercise  -Needs 30 min of activity for each day to eventually reach 150 min per week   06/08/23: Use of single point cane throughout  Nu-Step seat at 9 with resistance at 1 for 5 min  Standing Marches with BUE support 1 x 10  Standing Heel Raises with BUE support 1 x 10  10 m overground ambulation horizontal head turns x 10  Figure 8 with cones spaced 10 ft apart  x 10  -Pt reports ongoing left sided heel pain [place] with LLE 1 x 10 -min VC to increase foot arch     PATIENT EDUCATION:  Education details: Educated pt on use of pain relief drugs to avoid limitations of pain and that she should speak to her physician if she is concerned about what pain medications to take.   Person educated: Patient and Spouse Education method: Explanation, Facilities manager, Verbal  cues, and Handouts Education comprehension: verbalized understanding, returned demonstration, and verbal cues required  HOME EXERCISE  PROGRAM: Access Code: [email or web address] URL: [email or web address] Date: 06/30/2023 Prepared by: Ellin Goodie  Exercises - Long Sitting Calf Stretch with Strap  - 1 x daily - 3 reps - 60 sec sec  hold - Standing Hip Abduction with Counter Support  - 3-4 x weekly - 3 sets - 10 reps - Standing March with Counter Support  - 3-4 x weekly - 3 sets - 10 reps - Sit to Stand with Counter Support  - 3-4 x weekly - 3 sets - 8 reps - Seated Plantar Fascia Mobilization with Small Ball  - 1 x daily - 7 x weekly - 10 reps  ASSESSMENT:  CLINICAL IMPRESSION: Pt demonstrates ongoing progression towards goals with improvement in dynamic balance and overall mobility. Despite these improvements, she is still at an increased risk for falls and she continues to show difficulty with step over obstacles and negotiating stairs even with the use of an assistive device. Home exercise plan modified to include addition of hip flexion strengthening exercises especially on LLE due to pt's increased use of sartorius as primary hip flexor instead of iliopsoas as evidenced by external rotation with marches. She will continue to benefit from skilled PT to improve LE strength and aerobic endurance to improve mobility and decrease risk of falling while performing activities of daily living like grocery shopping or negotiating stairs.   OBJECTIVE IMPAIRMENTS: Abnormal gait, decreased balance, decreased endurance, difficulty walking, decreased strength, and pain.   ACTIVITY LIMITATIONS: carrying, lifting, bending, standing, squatting, stairs, transfers, and locomotion level  PARTICIPATION LIMITATIONS: meal prep, cleaning, laundry, driving, shopping, community activity, and church  PERSONAL FACTORS: Age, Past/current experiences, Time since onset of injury/illness/exacerbation, and 3+ comorbidities: HTN, T2DM, Afib  are also affecting patient's functional outcome.   REHAB POTENTIAL: Good  CLINICAL DECISION MAKING:  Stable/uncomplicated  EVALUATION COMPLEXITY: Low   GOALS: Goals reviewed with patient? No  SHORT TERM GOALS: Target date: 05/23/2023  PT reviewed the following HEP with patient with patient able to demonstrate a set of the following with min cuing for correction needed. PT educated patient on parameters of therex (how/when to inc/decrease intensity, frequency, rep/set range, stretch hold time, and purpose of therex) with verbalized understanding.  Baseline:NT  05/24/23: Performing independently   Goal status: ACHIEVED   2.  Patient will be able to perform a sit to stand without use of upper extremity support from 18 inch seated surface as evidence of improved LE strength.  Baseline: Needs to use hands 06/28/23: Needs to use hands still or push against knees  Goal status: ONGOING   3. Patient will demonstrate an improvement in DGI score of >=2 pts as evidence of improved dynamic balance to decrease risk of falling (Pardasaney et al, 2012).            Baseline: 11/24 06/30/23: 15/24  Goal status: ONGOING     LONG TERM GOALS: Target date: 07/18/2023  Patient will have improved function and activity level as evidenced by an increase in FOTO score by 8 points or more.  Baseline: 40 with target of 48 06/28/23: 39 with target of 48  Goal status: ONGOING   2.  Patient will perform five sit to stand repetitions in <=15 secs as evidence of LE strength that shows that this community dwelling older adult that is older is at a decreased risk of falling. (Buatois 2010)   Baseline: 0 reps; needs to use  hands 06/28/23: 0 reps; needs to use hands Goal status: ONGOING   3.  Patient will perform >=11 reps for 30 sec chair stands as evidence of improved LE endurance that decreases her risk of falling.  Baseline: 0 reps 06/28/23: 0 reps; needs to use hands  Goal status: ONGOING   4.  Patient will improve distance by >=40 ft for improved aerobic endurance and increased mobility to resume yard work outside  [employer], 2009).  Baseline: 175 ft 06/28/23: 229 ft  Goal status: ACHIEVED   5.  Patient will be able to negotiate >=5 stairs without modified independence as evidence of improved mobility and to decrease caregiver burden.  Baseline: Pt is safest using SPC to negotiate stairs  Goal status: Deferred   6. Patient will perform TUG in <13.5 sec as evidence of improved mobility and decreased risk for falling [facility] et al., 2000) Baseline: 20 sec 06/30/23: 17.57 sec  Goal status: ONGOING             7. Patient will demonstrate reduced falls risk as evidenced by Dynamic Gait Index (DGI) >19/24.  Baseline: 11/24  Goal status: ONGOING    PLAN:  PT FREQUENCY: 1-2x/week  PT DURATION: 10 weeks  PLANNED INTERVENTIONS: 97164- PT Re-evaluation, 97110-Therapeutic exercises, 97530- Therapeutic activity, 97112- Neuromuscular re-education, 97535- Self Care, 16109- Manual therapy, [phone]- Gait training, [phone]- Orthotic Fit/training, 97014- Electrical stimulation (unattended), [phone]- Electrical stimulation (manual), Patient/Family education, Balance training, Stair training, Dry Needling, Joint mobilization, Joint manipulation, Spinal manipulation, Spinal mobilization, Vestibular training, DME instructions, Cryotherapy, and Moist heat  PLAN FOR NEXT SESSION: Progress note. Continue with dynamic balance exercises: Obstacle step over ankle weights and step ups. Stengthen ilopsoas.  Continue with OTAGO exercises   Ellin Goodie PT, DPT  [facility] [address] [place], [address] Phone: [phone]   Fax:  [phone]

## 2023-07-04 DIAGNOSIS — E782 Mixed hyperlipidemia: Secondary | ICD-10-CM | POA: Diagnosis not present

## 2023-07-04 DIAGNOSIS — R053 Chronic cough: Secondary | ICD-10-CM | POA: Diagnosis not present

## 2023-07-04 DIAGNOSIS — R079 Chest pain, unspecified: Secondary | ICD-10-CM | POA: Diagnosis not present

## 2023-07-04 DIAGNOSIS — R0602 Shortness of breath: Secondary | ICD-10-CM | POA: Diagnosis not present

## 2023-07-04 DIAGNOSIS — G459 Transient cerebral ischemic attack, unspecified: Secondary | ICD-10-CM | POA: Diagnosis not present

## 2023-07-04 DIAGNOSIS — R001 Bradycardia, unspecified: Secondary | ICD-10-CM | POA: Diagnosis not present

## 2023-07-04 DIAGNOSIS — I495 Sick sinus syndrome: Secondary | ICD-10-CM | POA: Diagnosis not present

## 2023-07-04 DIAGNOSIS — E119 Type 2 diabetes mellitus without complications: Secondary | ICD-10-CM | POA: Diagnosis not present

## 2023-07-04 DIAGNOSIS — I10 Essential (primary) hypertension: Secondary | ICD-10-CM | POA: Diagnosis not present

## 2023-07-06 ENCOUNTER — Ambulatory Visit: Payer: Medicare HMO | Admitting: Physical Therapy

## 2023-07-13 DIAGNOSIS — E1165 Type 2 diabetes mellitus with hyperglycemia: Secondary | ICD-10-CM | POA: Diagnosis not present

## 2023-07-13 DIAGNOSIS — I10 Essential (primary) hypertension: Secondary | ICD-10-CM | POA: Diagnosis not present

## 2023-07-13 DIAGNOSIS — E1169 Type 2 diabetes mellitus with other specified complication: Secondary | ICD-10-CM | POA: Diagnosis not present

## 2023-07-13 DIAGNOSIS — E785 Hyperlipidemia, unspecified: Secondary | ICD-10-CM | POA: Diagnosis not present

## 2023-07-20 ENCOUNTER — Ambulatory Visit: Payer: Medicare HMO | Attending: Internal Medicine | Admitting: Physical Therapy

## 2023-07-20 DIAGNOSIS — R2689 Other abnormalities of gait and mobility: Secondary | ICD-10-CM | POA: Diagnosis not present

## 2023-07-20 DIAGNOSIS — R262 Difficulty in walking, not elsewhere classified: Secondary | ICD-10-CM | POA: Diagnosis not present

## 2023-07-20 DIAGNOSIS — M6281 Muscle weakness (generalized): Secondary | ICD-10-CM | POA: Insufficient documentation

## 2023-07-20 NOTE — Therapy (Unsigned)
 OUTPATIENT PHYSICAL THERAPY LOWER EXTREMITY PROGRESS   Patient Name: Christine Zimmerman MRN: [id number] DOB:[birth year], 78 y.o., female Today's Date: 07/20/2023  END OF SESSION:  PT End of Session - 07/20/23 1652     Visit Number 10    Number of Visits 18    Date for PT Re-Evaluation 10/18/23    Authorization Type [employer] 2025    Authorization Time Period auth 18 visits 1/28-4/1 auth# [id number]    Authorization - Visit Number 10    Authorization - Number of Visits 18    Progress Note Due on Visit 10    PT Start Time 1650    PT Stop Time 1730    PT Time Calculation (min) 40 min    Activity Tolerance Patient tolerated treatment well    Behavior During Therapy WFL for tasks assessed/performed                    Past Medical History:  Diagnosis Date   Atrial fibrillation (HCC)    CHF (congestive heart failure) (HCC)    Chronic pain    Coronary artery disease    Diabetes mellitus without complication (HCC)    GERD (gastroesophageal reflux disease)    High cholesterol    Hypertension    Hypothyroidism    Presence of permanent cardiac pacemaker    Past Surgical History:  Procedure Laterality Date   BACK SURGERY     CARDIAC CATHETERIZATION Left 10/23/2015   Procedure: Left Heart Cath and Coronary Angiography;  Surgeon: Alwyn Pea, MD;  Location: [facility] INVASIVE CV LAB;  Service: Cardiovascular;  Laterality: Left;   COLONOSCOPY N/A 04/29/2022   Procedure: COLONOSCOPY;  Surgeon: Jaynie Collins, DO;  Location: [facility] ENDOSCOPY;  Service: Gastroenterology;  Laterality: N/A;   COLONOSCOPY WITH PROPOFOL N/A 01/03/2017   Procedure: COLONOSCOPY WITH PROPOFOL;  Surgeon: Scot Jun, MD;  Location: [facility] ENDOSCOPY;  Service: Endoscopy;  Laterality: N/A;   COLONOSCOPY WITH PROPOFOL N/A 04/12/2022   Procedure: COLONOSCOPY WITH PROPOFOL;  Surgeon: Jaynie Collins, DO;  Location: [facility] ENDOSCOPY;  Service: Gastroenterology;  Laterality: N/A;   CORONARY STENT  PLACEMENT     x 4 at [facility]. x 1 by [place] in 2004   INSERT / REPLACE / REMOVE PACEMAKER     LEFT HEART CATH AND CORONARY ANGIOGRAPHY N/A 06/08/2017   Procedure: LEFT HEART CATH AND CORONARY ANGIOGRAPHY;  Surgeon: Alwyn Pea, MD;  Location: [facility] INVASIVE CV LAB;  Service: Cardiovascular;  Laterality: N/A;   PACEMAKER IMPLANT N/A 11/03/2021   Procedure: PACEMAKER IMPLANT;  Surgeon: Marcina Millard, MD;  Location: [facility] INVASIVE CV LAB;  Service: Cardiovascular;  Laterality: N/A;   Patient Active Problem List   Diagnosis Date Noted   Sick sinus syndrome (HCC) 11/03/2021   Weakness generalized 05/09/2019   Chronic diastolic heart failure (HCC) 03/23/2018   HTN (hypertension) 03/23/2018   DM (diabetes mellitus) (HCC) 03/23/2018   SOB (shortness of breath) 02/01/2018   Dizziness 08/01/2017   TIA (transient ischemic attack) 10/17/2015   Hyponatremia 10/17/2015   Anemia 10/17/2015   Chest pain 10/16/2015   Left-sided weakness 10/16/2015   Hypotension 10/16/2015   Bradycardia 10/16/2015   Hypoglycemia 10/16/2015    PCP: Dr. Barbette Reichmann   REFERRING PROVIDER: Dr. Barbette Reichmann   REFERRING DIAG:  M25.569 (ICD-10-CM) - Pain in joint, lower leg  M25.561 (ICD-10-CM) - Pain in right knee  M25.562 (ICD-10-CM) - Pain in left knee  M79.609 (ICD-10-CM) - Pain in limb  M79.604 (ICD-10-CM) -  Pain in right leg  M79.605 (ICD-10-CM) - Pain in left leg    THERAPY DIAG:  No diagnosis found.  Rationale for Evaluation and Treatment: Rehabilitation  ONSET DATE: 04/2019  SUBJECTIVE:   SUBJECTIVE STATEMENT: Pt states that she had a pot fall on her right foot and she was hurting for a couple of days. She is now feeling better. She has not been at physical therapy for the past month.   PERTINENT HISTORY: Pt reports ongoing knee pain that has been making it difficult for her to move and she has been steadily becoming less mobile. She used to go to water aerobics and that is  where she got most of her exercise, but she is afraid of falling and this keeps her from going to the class at the [facility]. Pt is a diabetic and she has diabetic neuropathy in her feet. She now has to use a single point cane to avoid from falling.  PAIN:  Are you having pain? Yes: NPRS scale: 2-3/10 Pain location: All around right knee  Pain description: Achy  Aggravating factors: Walking and standing or basically any weight bearing, stairs  Relieving factors: Vicks helps her go to sleep.   PRECAUTIONS: None  RED FLAGS: None   WEIGHT BEARING RESTRICTIONS: No  FALLS:  Has patient fallen in last 6 months? No  LIVING ENVIRONMENT: Lives with: lives with their spouse Lives in: House/apartment Stairs: Yes: External: 5 steps; on right going up Has following equipment at home: Single point cane and Walker - 2 wheeled  OCCUPATION: Retired   PLOF: Independent  PATIENT GOALS: To be able to walk without an assistive device and to get around her home and upstairs without help   NEXT MD VISIT: January 2025   OBJECTIVE:  Note: Objective measures were completed at Evaluation unless otherwise noted.  VITALS: 197/78 in sitting                 195/78 in sitting   DIAGNOSTIC FINDINGS:   AP and lateral x-rays of the lumbar spine were ordered and personally  reviewed today. These show extensive degenerative disc disease with  extensive osteophyte formation, mild SI arthritis on the AP view, no  scoliosis, pedicles intact. Lateral view shows severe degenerative changes  to the L4-5 disc space with extensive facet arthritis. No  spondylolisthesis, no fracture.  X-ray impression: Extensive degenerative disc and facet arthritis, lumbar  spine.   PATIENT SURVEYS:  FOTO 40 with target of    COGNITION: Overall cognitive status: Within functional limits for tasks assessed     SENSATION: Not tested  MUSCLE LENGTH:  Not performed   POSTURE: rounded shoulders  PALPATION: None performed     LOWER EXTREMITY ROM:  WFL   LOWER EXTREMITY MMT:   (Blank rows = not tested)  LOWER EXTREMITY SPECIAL TESTS: None performed   FUNCTIONAL TESTS:  5 times sit to stand: 0 reps 30 seconds chair stand test: 0 reps  Timed up and go (TUG): Not tested  2 minute walk test: 170 ft with use of single point cane  10 meter walk test: NT  Dynamic Gait Index: NT  MCTSIB: Condition 1: Avg of 3 trials: NT sec, Condition 2: Avg of 3 trials: NT sec, Condition 3: Avg of 3 trials: NT sec, Condition 4: Avg of 3 trials: NT sec, and Total Score: NT   GAIT: Distance walked: 50 ft  Assistive device utilized: Single point cane Level of assistance: Modified independence Comments: Right sided antalgic  gait                                                                                                                                 TREATMENT DATE:   07/20/23: THEREX  Nu-Step with seat and arms at 8 with resistance at 3 for 5 min  10 ft walks with change of direction at cone  x 10   PHYSICAL PERFORMANCE  : 250 ft with use of SPC  Able to negotiate 5 stairs with step to gait pattern with RLE going upwards and LLE going downwards and use of SPC  Timed Up and Go: Uses SPC  Trial 1: 22 sec  Trial 2: 22 sec    07/20/23 0001  Dynamic Gait Index  Level Surface 3  Change in Gait Speed 1  Gait with Horizontal Head Turns 2  Gait with Vertical Head Turns 2  Gait and Pivot Turn 2  Step Over Obstacle 0  Step Around Obstacles 3  Steps 1  Total Score 14     06/30/23: THEREX  Nu-Step with seat and arms at 7 with resistance at 3 for 5 min  Sit to Stand at 22 inch with 3 x 8 reps  -min VC to sequence exercise with nose over toes,   PHYSICAL PERFORMANCE  TUG  -Trial 1: 19.00 sec  -Trial 2: 16.14 sec  Average: 17.57 sec   DGI: 15/24  -Only used single point cane for the obstacle step over and the stairs  -Mild Impairment- Head turns and change in gait speed  -Moderate Impairment- Steps   -Severe Impairment- Step overs   NMR  Obstacle step over with 4 ankle weights serving as obstacles 10 M x 10  Standing Marches on LLE 2 x 10  -min VC to avoid compensation of internal and external rotation   06/28/23: THEREX  Nu-Step with seat and arms at 8 with resistance at 3 for 5 min  Sit to Stand from 25 inch mat height 1 x 8  Sit to Stand from 22 inch mat height 2 x 8  Plantar fasciitis self massage using lacrosse ball   PHYSICAL PERFORMANCE  5xSTS: 0 reps  30 sec chair stands: 0 reps  : 229 ft with single point cane  FOTO: 39   06/23/23: THEREX  Nu-Step with seat and arms at 8 with resistance at 3 for 6 min  Overground walking with use of rollator 100 ft Overground walking with use of rollator 10 M x 4   Standing Hip Abduction with BUE  2 x 10  Standing HS curls BUE 2 x 10  Seated HS isometrics  5 sec hold 2 x 10 Overground walking with use of rollator 10 M x 4  Mini-Squat using BUE support 2 x 10  -min VC to decrease depth of squat to avoid increased knee pain   THERAPEUTIC ACTIVITY  Stair training  Step to gait pattern with patient leading  with RLE upstairs and LLE downstairs 4 steps x 8 with 1 UE on railing and 1 UE using SPC.  -mod VC to step up with RLE instead of LLE   06/21/23: - 200 ft with use of single point cane.   THEREX  Long Arc Quad AROM 2 x 10  Long [employer]  with yellow band 2 x 10  Long [employer] with red band 2 x 10  Matrix Bike with seat at 11 for 2 min  -Pt reports increased knee pain  Nu-Step with seat and arms at 9 for 6 min   SELF-CARE  Discussion about needing to engage with aerobic exercise with Nu-step being best mode of exercise  -Needs 30 min of activity for each day to eventually reach 150 min per week   06/08/23: Use of single point cane throughout  Nu-Step seat at 9 with resistance at 1 for 5 min  Standing Marches with BUE support 1 x 10  Standing Heel Raises with BUE support 1 x 10  10 m overground ambulation  horizontal head turns x 10  Figure 8 with cones spaced 10 ft apart  x 10  -Pt reports ongoing left sided heel pain [place] with LLE 1 x 10 -min VC to increase foot arch     PATIENT EDUCATION:  Education details: Educated pt on use of pain relief drugs to avoid limitations of pain and that she should speak to her physician if she is concerned about what pain medications to take.   Person educated: Patient and Spouse Education method: Explanation, Demonstration, Verbal cues, and Handouts Education comprehension: verbalized understanding, returned demonstration, and verbal cues required  HOME EXERCISE PROGRAM: Access Code: [email or web address] URL: [email or web address] Date: 07/20/2023 Prepared by: Ellin Goodie  Exercises - Long Sitting Calf Stretch with Strap  - 1 x daily - 3 reps - 60 sec sec  hold - Walk Turns  - 1 x daily - 10 reps - Standing March with Counter Support  - 3-4 x weekly - 3 sets - 10 reps - Sit to Stand with Counter Support  - 3-4 x weekly - 3 sets - 8 reps - Seated Plantar Fascia Mobilization with Small Ball  - 1 x daily - 7 x weekly - 10 reps  ASSESSMENT:  CLINICAL IMPRESSION: Pt shows limited progress towards goals with ongoing LE weakness and endurance with need for UE support and ongoing dynamic balance deficits especially with obstacle step overs. She has not been consistently performing home exercise plan, so this could explain the lack of progress with LE strength. PT to continue with plan of care for five more visits and with ongoing emphasis on pt complying with home exercise program. She will continue to benefit from skilled PT to improve LE strength and aerobic endurance to improve mobility and decrease risk of falling while performing activities of daily living like grocery shopping or negotiating stairs.  OBJECTIVE IMPAIRMENTS: Abnormal gait, decreased balance, decreased endurance, difficulty walking, decreased strength, and pain.   ACTIVITY  LIMITATIONS: carrying, lifting, bending, standing, squatting, stairs, transfers, and locomotion level  PARTICIPATION LIMITATIONS: meal prep, cleaning, laundry, driving, shopping, community activity, and church  PERSONAL FACTORS: Age, Past/current experiences, Time since onset of injury/illness/exacerbation, and 3+ comorbidities: HTN, T2DM, Afib  are also affecting patient's functional outcome.   REHAB POTENTIAL: Good  CLINICAL DECISION MAKING: Stable/uncomplicated  EVALUATION COMPLEXITY: Low   GOALS: Goals reviewed with patient? No  SHORT TERM GOALS: Target date: 05/23/2023  PT reviewed the following HEP  with patient with patient able to demonstrate a set of the following with min cuing for correction needed. PT educated patient on parameters of therex (how/when to inc/decrease intensity, frequency, rep/set range, stretch hold time, and purpose of therex) with verbalized understanding.  Baseline:NT  05/24/23: Performing independently   Goal status: ACHIEVED   2.  Patient will be able to perform a sit to stand without use of upper extremity support from 18 inch seated surface as evidence of improved LE strength.  Baseline: Needs to use hands 06/28/23: Needs to use hands still or push against knees 07/20/23: Needs to use one hand to push up  Goal status: ONGOING   3. Patient will demonstrate an improvement in DGI score of >=2 pts as evidence of improved dynamic balance to decrease risk of falling (Pardasaney et al, 2012).            Baseline: 11/24 06/30/23: 15/24 07/20/23: 14/24 Goal status: ACHIEVED     LONG TERM GOALS: Target date: 07/18/2023  Patient will have improved function and activity level as evidenced by an increase in FOTO score by 8 points or more.  Baseline: 40 with target of 48 06/28/23: 39 with target of 48 07/20/23: 0 reps  Goal status: ONGOING   2.  Patient will perform five sit to stand repetitions in <=15 secs as evidence of LE strength that shows that this community  dwelling older adult that is older is at a decreased risk of falling. (Buatois 2010)   Baseline: 0 reps; needs to use hands 06/28/23: 0 reps; needs to use hands 07/20/23: 0 reps  Goal status: ONGOING   3.  Patient will perform >=11 reps for 30 sec chair stands as evidence of improved LE endurance that decreases her risk of falling.  Baseline: 0 reps 06/28/23: 0 reps; needs to use hands  Goal status: ONGOING   4.  Patient will improve distance by >=40 ft for improved aerobic endurance and increased mobility to resume yard work outside [employer], 2009).  Baseline: 175 ft 06/28/23: 229 ft 07/20/23: 250 ft  Goal status: ACHIEVED   5.  Patient will be able to negotiate >=5 stairs without modified independence as evidence of improved mobility and to decrease caregiver burden.  Baseline: Pt is safest using SPC to negotiate stairs  07/20/23: Able to negotiate 5 stairs with step to gait pattern with RLE going upwards and LLE going downwards and use of SPC  Goal status: Deferred   6. Patient will perform TUG in <13.5 sec as evidence of improved mobility and decreased risk for falling [facility] et al., 2000) Baseline: 20 sec 06/30/23: 17.57 sec 07/20/23: 22 sec  Goal status: ONGOING             7. Patient will demonstrate reduced falls risk as evidenced by Dynamic Gait Index (DGI) >19/24.  Baseline: 11/24 07/20/23: 14/24 Goal status: ONGOING    PLAN:  PT FREQUENCY: 1-2x/week  PT DURATION: 10 weeks  PLANNED INTERVENTIONS: 97164- PT Re-evaluation, 97110-Therapeutic exercises, 97530- Therapeutic activity, 97112- Neuromuscular re-education, 97535- Self Care, 45409- Manual therapy, 97116- Gait training, [phone]- Orthotic Fit/training, 97014- Electrical stimulation (unattended), [phone]- Electrical stimulation (manual), Patient/Family education, Balance training, Stair training, Dry Needling, Joint mobilization, Joint manipulation, Spinal manipulation, Spinal mobilization, Vestibular training, DME  instructions, Cryotherapy, and Moist heat  PLAN FOR NEXT SESSION:  Continue with dynamic balance exercises: Obstacle step over ankle weights and step ups. Sit stand from elevated surface.   Ellin Goodie PT, DPT  [facility] [address], [place], [address] Phone: [phone]   Fax:  [phone]

## 2023-07-20 NOTE — Addendum Note (Signed)
 Addended by: Johnn Hai on: 07/20/2023 06:24 PM   Modules accepted: Orders

## 2023-07-20 NOTE — Progress Notes (Unsigned)
   07/20/23 0001  Dynamic Gait Index  Total Score 14

## 2023-07-21 DIAGNOSIS — R0789 Other chest pain: Secondary | ICD-10-CM | POA: Diagnosis not present

## 2023-07-21 DIAGNOSIS — R0602 Shortness of breath: Secondary | ICD-10-CM | POA: Diagnosis not present

## 2023-07-27 ENCOUNTER — Ambulatory Visit: Payer: Medicare HMO | Admitting: Physical Therapy

## 2023-07-27 DIAGNOSIS — R262 Difficulty in walking, not elsewhere classified: Secondary | ICD-10-CM | POA: Diagnosis not present

## 2023-07-27 DIAGNOSIS — R2689 Other abnormalities of gait and mobility: Secondary | ICD-10-CM | POA: Diagnosis not present

## 2023-07-27 DIAGNOSIS — M6281 Muscle weakness (generalized): Secondary | ICD-10-CM | POA: Diagnosis not present

## 2023-07-27 NOTE — Therapy (Addendum)
 OUTPATIENT PHYSICAL THERAPY LOWER EXTREMITY TREATMENT   Patient Name: Christine Zimmerman MRN: [id number] DOB:26-Mar-1946, 78 y.o., female Today's Date: 07/27/2023  END OF SESSION:  PT End of Session - 07/27/23 1123     Visit Number 11    Number of Visits 18    Date for PT Re-Evaluation 10/18/23    Authorization Type [employer] 2025    Authorization Time Period auth 18 visits 1/28-4/1 auth# [id number]    Authorization - Visit Number 11    Authorization - Number of Visits 18    Progress Note Due on Visit 18    PT Start Time 1000    PT Stop Time 1030    PT Time Calculation (min) 30 min    Activity Tolerance Patient limited by pain    Behavior During Therapy WFL for tasks assessed/performed                     Past Medical History:  Diagnosis Date   Atrial fibrillation (HCC)    CHF (congestive heart failure) (HCC)    Chronic pain    Coronary artery disease    Diabetes mellitus without complication (HCC)    GERD (gastroesophageal reflux disease)    High cholesterol    Hypertension    Hypothyroidism    Presence of permanent cardiac pacemaker    Past Surgical History:  Procedure Laterality Date   BACK SURGERY     CARDIAC CATHETERIZATION Left 10/23/2015   Procedure: Left Heart Cath and Coronary Angiography;  Surgeon: Alwyn Pea, MD;  Location: [facility] INVASIVE CV LAB;  Service: Cardiovascular;  Laterality: Left;   COLONOSCOPY N/A 04/29/2022   Procedure: COLONOSCOPY;  Surgeon: Jaynie Collins, DO;  Location: [facility] ENDOSCOPY;  Service: Gastroenterology;  Laterality: N/A;   COLONOSCOPY WITH PROPOFOL N/A 01/03/2017   Procedure: COLONOSCOPY WITH PROPOFOL;  Surgeon: Scot Jun, MD;  Location: [facility] ENDOSCOPY;  Service: Endoscopy;  Laterality: N/A;   COLONOSCOPY WITH PROPOFOL N/A 04/12/2022   Procedure: COLONOSCOPY WITH PROPOFOL;  Surgeon: Jaynie Collins, DO;  Location: [facility] ENDOSCOPY;  Service: Gastroenterology;  Laterality: N/A;   CORONARY STENT PLACEMENT      x 4 at [facility]. x 1 by [place] in 2004   INSERT / REPLACE / REMOVE PACEMAKER     LEFT HEART CATH AND CORONARY ANGIOGRAPHY N/A 06/08/2017   Procedure: LEFT HEART CATH AND CORONARY ANGIOGRAPHY;  Surgeon: Alwyn Pea, MD;  Location: [facility] INVASIVE CV LAB;  Service: Cardiovascular;  Laterality: N/A;   PACEMAKER IMPLANT N/A 11/03/2021   Procedure: PACEMAKER IMPLANT;  Surgeon: Marcina Millard, MD;  Location: [facility] INVASIVE CV LAB;  Service: Cardiovascular;  Laterality: N/A;   Patient Active Problem List   Diagnosis Date Noted   Sick sinus syndrome (HCC) 11/03/2021   Weakness generalized 05/09/2019   Chronic diastolic heart failure (HCC) 03/23/2018   HTN (hypertension) 03/23/2018   DM (diabetes mellitus) (HCC) 03/23/2018   SOB (shortness of breath) 02/01/2018   Dizziness 08/01/2017   TIA (transient ischemic attack) 10/17/2015   Hyponatremia 10/17/2015   Anemia 10/17/2015   Chest pain 10/16/2015   Left-sided weakness 10/16/2015   Hypotension 10/16/2015   Bradycardia 10/16/2015   Hypoglycemia 10/16/2015    PCP: Dr. Barbette Reichmann   REFERRING PROVIDER: Dr. Barbette Reichmann   REFERRING DIAG:  M25.569 (ICD-10-CM) - Pain in joint, lower leg  M25.561 (ICD-10-CM) - Pain in right knee  M25.562 (ICD-10-CM) - Pain in left knee  M79.609 (ICD-10-CM) - Pain in limb  M79.604 (  ICD-10-CM) - Pain in right leg  M79.605 (ICD-10-CM) - Pain in left leg    THERAPY DIAG:  Difficulty in walking, not elsewhere classified  Imbalance  Rationale for Evaluation and Treatment: Rehabilitation  ONSET DATE: 04/2019  SUBJECTIVE:   SUBJECTIVE STATEMENT: Pt states that she had a pot fall on her right foot and she was hurting for a couple of days. She is now feeling better. She has not been at physical therapy for the past month.   PERTINENT HISTORY: Pt reports ongoing knee pain that has been making it difficult for her to move and she has been steadily becoming less mobile. She used to  go to water aerobics and that is where she got most of her exercise, but she is afraid of falling and this keeps her from going to the class at the [facility]. Pt is a diabetic and she has diabetic neuropathy in her feet. She now has to use a single point cane to avoid from falling.  PAIN:  Are you having pain? Yes: NPRS scale: 2-3/10 Pain location: All around right knee  Pain description: Achy  Aggravating factors: Walking and standing or basically any weight bearing, stairs  Relieving factors: Vicks helps her go to sleep.   PRECAUTIONS: None  RED FLAGS: None   WEIGHT BEARING RESTRICTIONS: No  FALLS:  Has patient fallen in last 6 months? No  LIVING ENVIRONMENT: Lives with: lives with their spouse Lives in: House/apartment Stairs: Yes: External: 5 steps; on right going up Has following equipment at home: Single point cane and Walker - 2 wheeled  OCCUPATION: Retired   PLOF: Independent  PATIENT GOALS: To be able to walk without an assistive device and to get around her home and upstairs without help   NEXT MD VISIT: January 2025   OBJECTIVE:  Note: Objective measures were completed at Evaluation unless otherwise noted.  VITALS: 197/78 in sitting                 195/78 in sitting   DIAGNOSTIC FINDINGS:   AP and lateral x-rays of the lumbar spine were ordered and personally  reviewed today. These show extensive degenerative disc disease with  extensive osteophyte formation, mild SI arthritis on the AP view, no  scoliosis, pedicles intact. Lateral view shows severe degenerative changes  to the L4-5 disc space with extensive facet arthritis. No  spondylolisthesis, no fracture.  X-ray impression: Extensive degenerative disc and facet arthritis, lumbar  spine.   PATIENT SURVEYS:  FOTO 40 with target of    COGNITION: Overall cognitive status: Within functional limits for tasks assessed     SENSATION: Not tested  MUSCLE LENGTH:  Not performed   POSTURE: rounded  shoulders  PALPATION: None performed    LOWER EXTREMITY ROM:  WFL   LOWER EXTREMITY MMT:   (Blank rows = not tested)  LOWER EXTREMITY SPECIAL TESTS: None performed   FUNCTIONAL TESTS:  5 times sit to stand: 0 reps 30 seconds chair stand test: 0 reps  Timed up and go (TUG): Not tested  2 minute walk test: 170 ft with use of single point cane  10 meter walk test: NT  Dynamic Gait Index: NT  MCTSIB: Condition 1: Avg of 3 trials: NT sec, Condition 2: Avg of 3 trials: NT sec, Condition 3: Avg of 3 trials: NT sec, Condition 4: Avg of 3 trials: NT sec, and Total Score: NT   GAIT: Distance walked: 50 ft  Assistive device utilized: Single point cane Level of  assistance: Modified independence Comments: Right sided antalgic gait                                                                                                                                 TREATMENT DATE:   07/27/23: THEREX  Nu-Step with seat at 14 for 6 min  Step Ups on 6 inch step using railing x 10  -mod VC step up with LLE and down with RLE   NMR  15 ft sit to to stand to 15 ft overground ambulation with obstacle step over ( 3x flattened ankle weights) while wearing #2 Ankle Weights x 6  -Pt struggles to clear obstacles 15 ft sit to to stand to 15 ft overground ambulation with 2 obstacle step over ( 2 x flattened ankle weights) and 1 4 inch step up while wearing #2 Ankle Weights x 6  15 ft overground ambulation with practice for carrying can in left hand or strong side  x 10   07/20/23: THEREX  Nu-Step with seat and arms at 8 with resistance at 3 for 5 min  10 ft walks with change of direction at cone  x 10   PHYSICAL PERFORMANCE  : 250 ft with use of SPC  Able to negotiate 5 stairs with step to gait pattern with RLE going upwards and LLE going downwards and use of SPC  Timed Up and Go: Uses SPC  Trial 1: 22 sec  Trial 2: 22 sec    07/20/23 0001  Dynamic Gait Index  Level Surface 3  Change in Gait  Speed 1  Gait with Horizontal Head Turns 2  Gait with Vertical Head Turns 2  Gait and Pivot Turn 2  Step Over Obstacle 0  Step Around Obstacles 3  Steps 1  Total Score 14     06/30/23: THEREX  Nu-Step with seat and arms at 7 with resistance at 3 for 5 min  Sit to Stand at 22 inch with 3 x 8 reps  -min VC to sequence exercise with nose over toes,   PHYSICAL PERFORMANCE  TUG  -Trial 1: 19.00 sec  -Trial 2: 16.14 sec  Average: 17.57 sec   DGI: 15/24  -Only used single point cane for the obstacle step over and the stairs  -Mild Impairment- Head turns and change in gait speed  -Moderate Impairment- Steps  -Severe Impairment- Step overs   NMR  Obstacle step over with 4 ankle weights serving as obstacles 10 M x 10  Standing Marches on LLE 2 x 10  -min VC to avoid compensation of internal and external rotation   06/28/23: THEREX  Nu-Step with seat and arms at 8 with resistance at 3 for 5 min  Sit to Stand from 25 inch mat height 1 x 8  Sit to Stand from 22 inch mat height 2 x 8  Plantar fasciitis self massage using lacrosse ball   PHYSICAL PERFORMANCE  5xSTS: 0  reps  30 sec chair stands: 0 reps  : 229 ft with single point cane  FOTO: 39   06/23/23: THEREX  Nu-Step with seat and arms at 8 with resistance at 3 for 6 min  Overground walking with use of rollator 100 ft Overground walking with use of rollator 10 M x 4   Standing Hip Abduction with BUE  2 x 10  Standing HS curls BUE 2 x 10  Seated HS isometrics  5 sec hold 2 x 10 Overground walking with use of rollator 10 M x 4  Mini-Squat using BUE support 2 x 10  -min VC to decrease depth of squat to avoid increased knee pain   THERAPEUTIC ACTIVITY  Stair training  Step to gait pattern with patient leading with RLE upstairs and LLE downstairs 4 steps x 8 with 1 UE on railing and 1 UE using SPC.  -mod VC to step up with RLE instead of LLE   06/21/23: - 200 ft with use of single point cane.   THEREX  Long Arc  Quad AROM 2 x 10  Long [employer]  with yellow band 2 x 10  Long [employer] with red band 2 x 10  Matrix Bike with seat at 11 for 2 min  -Pt reports increased knee pain  Nu-Step with seat and arms at 9 for 6 min   SELF-CARE  Discussion about needing to engage with aerobic exercise with Nu-step being best mode of exercise  -Needs 30 min of activity for each day to eventually reach 150 min per week      PATIENT EDUCATION:  Education details: Educated pt on use of pain relief drugs to avoid limitations of pain and that she should speak to her physician if she is concerned about what pain medications to take.   Person educated: Patient and Spouse Education method: Explanation, Demonstration, Verbal cues, and Handouts Education comprehension: verbalized understanding, returned demonstration, and verbal cues required  HOME EXERCISE PROGRAM: Access Code: [email or web address] URL: [email or web address] Date: 07/27/2023 Prepared by: Ellin Goodie  Exercises - Long Sitting Calf Stretch with Strap  - 1 x daily - 3 reps - 60 sec sec  hold - Walk Turns  - 1 x daily - 10 reps - Sit to Stand with Counter Support  - 3-4 x weekly - 3 sets - 8 reps - Seated Plantar Fascia Mobilization with Small Ball  - 1 x daily - 7 x weekly - 10 reps - Forward Step Up  - 3-4 x weekly - 2 sets - 10 reps  ASSESSMENT:  CLINICAL IMPRESSION: Pt continues to be limited by ongoing bilateral knee pain. PT recommended that she pre medicate to avoid limitations due to pain. She was able to demonstrate understanding of correct use of assistive device and stair negotiation with mod VC.  She will continue to benefit from skilled PT to improve LE strength and aerobic endurance to improve mobility and decrease risk of falling while performing activities of daily living like grocery shopping or negotiating stairs.  OBJECTIVE IMPAIRMENTS: Abnormal gait, decreased balance, decreased endurance, difficulty walking, decreased  strength, and pain.   ACTIVITY LIMITATIONS: carrying, lifting, bending, standing, squatting, stairs, transfers, and locomotion level  PARTICIPATION LIMITATIONS: meal prep, cleaning, laundry, driving, shopping, community activity, and church  PERSONAL FACTORS: Age, Past/current experiences, Time since onset of injury/illness/exacerbation, and 3+ comorbidities: HTN, T2DM, Afib  are also affecting patient's functional outcome.   REHAB POTENTIAL: Good  CLINICAL DECISION MAKING: Stable/uncomplicated  EVALUATION COMPLEXITY:  Low   GOALS: Goals reviewed with patient? No  SHORT TERM GOALS: Target date: 05/23/2023  PT reviewed the following HEP with patient with patient able to demonstrate a set of the following with min cuing for correction needed. PT educated patient on parameters of therex (how/when to inc/decrease intensity, frequency, rep/set range, stretch hold time, and purpose of therex) with verbalized understanding.  Baseline:NT  05/24/23: Performing independently   Goal status: ACHIEVED   2.  Patient will be able to perform a sit to stand without use of upper extremity support from 18 inch seated surface as evidence of improved LE strength.  Baseline: Needs to use hands 06/28/23: Needs to use hands still or push against knees 07/20/23: Needs to use one hand to push up  Goal status: ONGOING   3. Patient will demonstrate an improvement in DGI score of >=2 pts as evidence of improved dynamic balance to decrease risk of falling (Pardasaney et al, 2012).            Baseline: 11/24 06/30/23: 15/24 07/20/23: 14/24 Goal status: ACHIEVED     LONG TERM GOALS: Target date: 07/18/2023  Patient will have improved function and activity level as evidenced by an increase in FOTO score by 8 points or more.  Baseline: 40 with target of 48 06/28/23: 39 with target of 48 07/20/23: 0 reps  Goal status: ONGOING   2.  Patient will perform five sit to stand repetitions in <=15 secs as evidence of LE strength that  shows that this community dwelling older adult that is older is at a decreased risk of falling. (Buatois 2010)   Baseline: 0 reps; needs to use hands 06/28/23: 0 reps; needs to use hands 07/20/23: 0 reps  Goal status: ONGOING   3.  Patient will perform >=11 reps for 30 sec chair stands as evidence of improved LE endurance that decreases her risk of falling.  Baseline: 0 reps 06/28/23: 0 reps; needs to use hands  Goal status: ONGOING   4.  Patient will improve distance by >=40 ft for improved aerobic endurance and increased mobility to resume yard work outside [employer], 2009).  Baseline: 175 ft 06/28/23: 229 ft 07/20/23: 250 ft  Goal status: ACHIEVED   5.  Patient will be able to negotiate >=5 stairs without modified independence as evidence of improved mobility and to decrease caregiver burden.  Baseline: Pt is safest using SPC to negotiate stairs  07/20/23: Able to negotiate 5 stairs with step to gait pattern with RLE going upwards and LLE going downwards and use of SPC  Goal status: Deferred   6. Patient will perform TUG in <13.5 sec as evidence of improved mobility and decreased risk for falling [facility] et al., 2000) Baseline: 20 sec 06/30/23: 17.57 sec 07/20/23: 22 sec  Goal status: ONGOING             7. Patient will demonstrate reduced falls risk as evidenced by Dynamic Gait Index (DGI) >19/24.  Baseline: 11/24 07/20/23: 14/24 Goal status: ONGOING    PLAN:  PT FREQUENCY: 1-2x/week  PT DURATION: 10 weeks  PLANNED INTERVENTIONS: 97164- PT Re-evaluation, 97110-Therapeutic exercises, 97530- Therapeutic activity, 97112- Neuromuscular re-education, 97535- Self Care, 16109- Manual therapy, 97116- Gait training, [phone]- Orthotic Fit/training, 97014- Electrical stimulation (unattended), [phone]- Electrical stimulation (manual), Patient/Family education, Balance training, Stair training, Dry Needling, Joint mobilization, Joint manipulation, Spinal manipulation, Spinal mobilization,  Vestibular training, DME instructions, Cryotherapy, and Moist heat  PLAN FOR NEXT SESSION:  Continue with dynamic balance  exercises: Obstacle step over ankle weights and step ups. Stair negotiation and mini-squat   Ellin Goodie PT, DPT  [facility] [address] [place], [address] Phone: [phone]   Fax:  [phone]

## 2023-08-02 ENCOUNTER — Encounter: Payer: Self-pay | Admitting: Physical Therapy

## 2023-08-02 ENCOUNTER — Ambulatory Visit: Payer: Medicare HMO | Admitting: Physical Therapy

## 2023-08-02 DIAGNOSIS — R2689 Other abnormalities of gait and mobility: Secondary | ICD-10-CM

## 2023-08-02 DIAGNOSIS — R262 Difficulty in walking, not elsewhere classified: Secondary | ICD-10-CM

## 2023-08-02 DIAGNOSIS — M6281 Muscle weakness (generalized): Secondary | ICD-10-CM | POA: Diagnosis not present

## 2023-08-02 NOTE — Therapy (Signed)
 OUTPATIENT PHYSICAL THERAPY LOWER EXTREMITY TREATMENT   Patient Name: Christine Zimmerman MRN: [id number] DOB:11-08-1945, 78 y.o., female Today's Date: 08/02/2023  END OF SESSION:  PT End of Session - 08/02/23 1443     Visit Number 12    Number of Visits 18    Date for PT Re-Evaluation 10/18/23    Authorization Type [employer] 2025    Authorization Time Period auth 18 visits 1/28-4/1 auth# [id number]    Authorization - Visit Number 12    Authorization - Number of Visits 18    Progress Note Due on Visit 18    PT Start Time 1435    PT Stop Time 1515    PT Time Calculation (min) 40 min    Activity Tolerance Patient limited by pain    Behavior During Therapy [facility] for tasks assessed/performed                     Past Medical History:  Diagnosis Date   Atrial fibrillation (HCC)    CHF (congestive heart failure) (HCC)    Chronic pain    Coronary artery disease    Diabetes mellitus without complication (HCC)    GERD (gastroesophageal reflux disease)    High cholesterol    Hypertension    Hypothyroidism    Presence of permanent cardiac pacemaker    Past Surgical History:  Procedure Laterality Date   BACK SURGERY     CARDIAC CATHETERIZATION Left 10/23/2015   Procedure: Left Heart Cath and Coronary Angiography;  Surgeon: Alwyn Pea, MD;  Location: [facility] INVASIVE CV LAB;  Service: Cardiovascular;  Laterality: Left;   COLONOSCOPY N/A 04/29/2022   Procedure: COLONOSCOPY;  Surgeon: Jaynie Collins, DO;  Location: [facility] ENDOSCOPY;  Service: Gastroenterology;  Laterality: N/A;   COLONOSCOPY WITH PROPOFOL N/A 01/03/2017   Procedure: COLONOSCOPY WITH PROPOFOL;  Surgeon: Scot Jun, MD;  Location: [facility] ENDOSCOPY;  Service: Endoscopy;  Laterality: N/A;   COLONOSCOPY WITH PROPOFOL N/A 04/12/2022   Procedure: COLONOSCOPY WITH PROPOFOL;  Surgeon: Jaynie Collins, DO;  Location: [facility] ENDOSCOPY;  Service: Gastroenterology;  Laterality: N/A;   CORONARY STENT PLACEMENT      x 4 at [facility]. x 1 by [place] in 2004   INSERT / REPLACE / REMOVE PACEMAKER     LEFT HEART CATH AND CORONARY ANGIOGRAPHY N/A 06/08/2017   Procedure: LEFT HEART CATH AND CORONARY ANGIOGRAPHY;  Surgeon: Alwyn Pea, MD;  Location: [facility] INVASIVE CV LAB;  Service: Cardiovascular;  Laterality: N/A;   PACEMAKER IMPLANT N/A 11/03/2021   Procedure: PACEMAKER IMPLANT;  Surgeon: Marcina Millard, MD;  Location: [facility] INVASIVE CV LAB;  Service: Cardiovascular;  Laterality: N/A;   Patient Active Problem List   Diagnosis Date Noted   Sick sinus syndrome (HCC) 11/03/2021   Weakness generalized 05/09/2019   Chronic diastolic heart failure (HCC) 03/23/2018   HTN (hypertension) 03/23/2018   DM (diabetes mellitus) (HCC) 03/23/2018   SOB (shortness of breath) 02/01/2018   Dizziness 08/01/2017   TIA (transient ischemic attack) 10/17/2015   Hyponatremia 10/17/2015   Anemia 10/17/2015   Chest pain 10/16/2015   Left-sided weakness 10/16/2015   Hypotension 10/16/2015   Bradycardia 10/16/2015   Hypoglycemia 10/16/2015    PCP: Dr. Barbette Reichmann   REFERRING PROVIDER: Dr. Barbette Reichmann   REFERRING DIAG:  M25.569 (ICD-10-CM) - Pain in joint, lower leg  M25.561 (ICD-10-CM) - Pain in right knee  M25.562 (ICD-10-CM) - Pain in left knee  M79.609 (ICD-10-CM) - Pain in limb  M79.604 (  ICD-10-CM) - Pain in right leg  M79.605 (ICD-10-CM) - Pain in left leg    THERAPY DIAG:  Difficulty in walking, not elsewhere classified  Imbalance  Rationale for Evaluation and Treatment: Rehabilitation  ONSET DATE: 04/2019  SUBJECTIVE:   SUBJECTIVE STATEMENT: Pt reports increased right sided sciatic pain that has limited her ability to do exercises.   PERTINENT HISTORY: Pt reports ongoing knee pain that has been making it difficult for her to move and she has been steadily becoming less mobile. She used to go to water aerobics and that is where she got most of her exercise, but she is afraid  of falling and this keeps her from going to the class at the [facility]. Pt is a diabetic and she has diabetic neuropathy in her feet. She now has to use a single point cane to avoid from falling.  PAIN:  Are you having pain? Yes: NPRS scale: 2-3/10 Pain location: All around right knee  Pain description: Achy  Aggravating factors: Walking and standing or basically any weight bearing, stairs  Relieving factors: Vicks helps her go to sleep.   PRECAUTIONS: None  RED FLAGS: None   WEIGHT BEARING RESTRICTIONS: No  FALLS:  Has patient fallen in last 6 months? No  LIVING ENVIRONMENT: Lives with: lives with their spouse Lives in: House/apartment Stairs: Yes: External: 5 steps; on right going up Has following equipment at home: Single point cane and Walker - 2 wheeled  OCCUPATION: Retired   PLOF: Independent  PATIENT GOALS: To be able to walk without an assistive device and to get around her home and upstairs without help   NEXT MD VISIT: January 2025   OBJECTIVE:  Note: Objective measures were completed at Evaluation unless otherwise noted.  VITALS: 197/78 in sitting                 195/78 in sitting   DIAGNOSTIC FINDINGS:   AP and lateral x-rays of the lumbar spine were ordered and personally  reviewed today. These show extensive degenerative disc disease with  extensive osteophyte formation, mild SI arthritis on the AP view, no  scoliosis, pedicles intact. Lateral view shows severe degenerative changes  to the L4-5 disc space with extensive facet arthritis. No  spondylolisthesis, no fracture.  X-ray impression: Extensive degenerative disc and facet arthritis, lumbar  spine.   PATIENT SURVEYS:  FOTO 40 with target of    COGNITION: Overall cognitive status: Within functional limits for tasks assessed     SENSATION: Not tested  MUSCLE LENGTH:  Not performed   POSTURE: rounded shoulders  PALPATION: None performed    LOWER EXTREMITY ROM:  WFL   LOWER EXTREMITY  MMT:   (Blank rows = not tested)  LOWER EXTREMITY SPECIAL TESTS: None performed   FUNCTIONAL TESTS:  5 times sit to stand: 0 reps 30 seconds chair stand test: 0 reps  Timed up and go (TUG): Not tested  2 minute walk test: 170 ft with use of single point cane  10 meter walk test: NT  Dynamic Gait Index: NT  MCTSIB: Condition 1: Avg of 3 trials: NT sec, Condition 2: Avg of 3 trials: NT sec, Condition 3: Avg of 3 trials: NT sec, Condition 4: Avg of 3 trials: NT sec, and Total Score: NT   GAIT: Distance walked: 50 ft  Assistive device utilized: Single point cane Level of assistance: Modified independence Comments: Right sided antalgic gait  TREATMENT DATE:   08/02/23: THEREX Nu-Step seat and arms at 7 for 8 min   THERAPEUTIC ACTIVITY  Overground walking with rollator outside in parking lot 1032 ft  -min VC to decrease speed down hills to avoid falling   -min VC to maintain close proximity to rollator  - supervision to CGA    07/27/23: THEREX  Nu-Step with seat at 14 for 6 min  Step Ups on 6 inch step using railing x 10  -mod VC step up with LLE and down with RLE   NMR  15 ft sit to to stand to 15 ft overground ambulation with obstacle step over ( 3x flattened ankle weights) while wearing #2 Ankle Weights x 6  -Pt struggles to clear obstacles 15 ft sit to to stand to 15 ft overground ambulation with 2 obstacle step over ( 2 x flattened ankle weights) and 1 4 inch step up while wearing #2 Ankle Weights x 6  15 ft overground ambulation with practice for carrying can in left hand or strong side  x 10   07/20/23: THEREX  Nu-Step with seat and arms at 8 with resistance at 3 for 5 min  10 ft walks with change of direction at cone  x 10   PHYSICAL PERFORMANCE  : 250 ft with use of SPC  Able to negotiate 5 stairs with step to gait pattern with RLE  going upwards and LLE going downwards and use of SPC  Timed Up and Go: Uses SPC  Trial 1: 22 sec  Trial 2: 22 sec    07/20/23 0001  Dynamic Gait Index  Level Surface 3  Change in Gait Speed 1  Gait with Horizontal Head Turns 2  Gait with Vertical Head Turns 2  Gait and Pivot Turn 2  Step Over Obstacle 0  Step Around Obstacles 3  Steps 1  Total Score 14     06/30/23: THEREX  Nu-Step with seat and arms at 7 with resistance at 3 for 5 min  Sit to Stand at 22 inch with 3 x 8 reps  -min VC to sequence exercise with nose over toes,   PHYSICAL PERFORMANCE  TUG  -Trial 1: 19.00 sec  -Trial 2: 16.14 sec  Average: 17.57 sec   DGI: 15/24  -Only used single point cane for the obstacle step over and the stairs  -Mild Impairment- Head turns and change in gait speed  -Moderate Impairment- Steps  -Severe Impairment- Step overs   NMR  Obstacle step over with 4 ankle weights serving as obstacles 10 M x 10  Standing Marches on LLE 2 x 10  -min VC to avoid compensation of internal and external rotation     PATIENT EDUCATION:  Education details: Educated pt on use of pain relief drugs to avoid limitations of pain and that she should speak to her physician if she is concerned about what pain medications to take.   Person educated: Patient and Spouse Education method: Explanation, Demonstration, Verbal cues, and Handouts Education comprehension: verbalized understanding, returned demonstration, and verbal cues required  HOME EXERCISE PROGRAM: Access Code: [email or web address] URL: [email or web address] Date: 08/02/2023 Prepared by: Ellin Goodie  Exercises - Walking  - 1 x daily - Long Sitting Calf Stretch with Strap  - 1 x daily - 3 reps - 60 sec sec  hold - Walk Turns  - 1 x daily - 10 reps - Sit to Stand with Counter Support  - 3-4 x weekly - 3 sets - 8 reps -  Seated Plantar Fascia Mobilization with Small Ball  - 1 x daily - 7 x weekly - 10 reps - Forward Step Up  - 3-4 x  weekly - 2 sets - 10 reps  ASSESSMENT:  CLINICAL IMPRESSION: Pt shows improvement in aerobic endurance and dynamic balance with ability to ambulate outside in parking lot and up and down hills. Pt did require min VC to maintain close proximity to rollator especially going down hills. PT recommends that pt complete home walking program given her ability to safely ambulate outside. She will continue to benefit from skilled PT to improve LE strength and aerobic endurance to improve mobility and decrease risk of falling while performing activities of daily living like grocery shopping or negotiating stairs.  OBJECTIVE IMPAIRMENTS: Abnormal gait, decreased balance, decreased endurance, difficulty walking, decreased strength, and pain.   ACTIVITY LIMITATIONS: carrying, lifting, bending, standing, squatting, stairs, transfers, and locomotion level  PARTICIPATION LIMITATIONS: meal prep, cleaning, laundry, driving, shopping, community activity, and church  PERSONAL FACTORS: Age, Past/current experiences, Time since onset of injury/illness/exacerbation, and 3+ comorbidities: HTN, T2DM, Afib  are also affecting patient's functional outcome.   REHAB POTENTIAL: Good  CLINICAL DECISION MAKING: Stable/uncomplicated  EVALUATION COMPLEXITY: Low   GOALS: Goals reviewed with patient? No  SHORT TERM GOALS: Target date: 05/23/2023  PT reviewed the following HEP with patient with patient able to demonstrate a set of the following with min cuing for correction needed. PT educated patient on parameters of therex (how/when to inc/decrease intensity, frequency, rep/set range, stretch hold time, and purpose of therex) with verbalized understanding.  Baseline:NT  05/24/23: Performing independently   Goal status: ACHIEVED   2.  Patient will be able to perform a sit to stand without use of upper extremity support from 18 inch seated surface as evidence of improved LE strength.  Baseline: Needs to use hands 06/28/23:  Needs to use hands still or push against knees 07/20/23: Needs to use one hand to push up  Goal status: ONGOING   3. Patient will demonstrate an improvement in DGI score of >=2 pts as evidence of improved dynamic balance to decrease risk of falling (Pardasaney et al, 2012).            Baseline: 11/24 06/30/23: 15/24 07/20/23: 14/24 Goal status: ACHIEVED     LONG TERM GOALS: Target date: 07/18/2023  Patient will have improved function and activity level as evidenced by an increase in FOTO score by 8 points or more.  Baseline: 40 with target of 48 06/28/23: 39 with target of 48 07/20/23: 0 reps  Goal status: ONGOING   2.  Patient will perform five sit to stand repetitions in <=15 secs as evidence of LE strength that shows that this community dwelling older adult that is older is at a decreased risk of falling. (Buatois 2010)   Baseline: 0 reps; needs to use hands 06/28/23: 0 reps; needs to use hands 07/20/23: 0 reps  Goal status: ONGOING   3.  Patient will perform >=11 reps for 30 sec chair stands as evidence of improved LE endurance that decreases her risk of falling.  Baseline: 0 reps 06/28/23: 0 reps; needs to use hands  Goal status: ONGOING   4.  Patient will improve distance by >=40 ft for improved aerobic endurance and increased mobility to resume yard work outside [employer], 2009).  Baseline: 175 ft 06/28/23: 229 ft 07/20/23: 250 ft  Goal status: ACHIEVED   5.  Patient will be able to negotiate >=5  stairs without modified independence as evidence of improved mobility and to decrease caregiver burden.  Baseline: Pt is safest using SPC to negotiate stairs  07/20/23: Able to negotiate 5 stairs with step to gait pattern with RLE going upwards and LLE going downwards and use of SPC  Goal status: Deferred   6. Patient will perform TUG in <13.5 sec as evidence of improved mobility and decreased risk for falling [facility] et al., 2000) Baseline: 20 sec 06/30/23: 17.57 sec 07/20/23: 22 sec   Goal status: ONGOING             7. Patient will demonstrate reduced falls risk as evidenced by Dynamic Gait Index (DGI) >19/24.  Baseline: 11/24 07/20/23: 14/24 Goal status: ONGOING    PLAN:  PT FREQUENCY: 1-2x/week  PT DURATION: 10 weeks  PLANNED INTERVENTIONS: 97164- PT Re-evaluation, 97110-Therapeutic exercises, 97530- Therapeutic activity, 97112- Neuromuscular re-education, 97535- Self Care, 47425- Manual therapy, 97116- Gait training, [phone]- Orthotic Fit/training, 97014- Electrical stimulation (unattended), [phone]- Electrical stimulation (manual), Patient/Family education, Balance training, Stair training, Dry Needling, Joint mobilization, Joint manipulation, Spinal manipulation, Spinal mobilization, Vestibular training, DME instructions, Cryotherapy, and Moist heat  PLAN FOR NEXT SESSION:  Continue with dynamic balance exercises: Obstacle step over ankle weights and step ups. Stair negotiation and mini-squat   Ellin Goodie PT, DPT  [facility] [address], [place], [address] Phone: [phone]   Fax:  [phone]

## 2023-08-03 DIAGNOSIS — I495 Sick sinus syndrome: Secondary | ICD-10-CM | POA: Diagnosis not present

## 2023-08-04 ENCOUNTER — Ambulatory Visit: Payer: Medicare HMO | Admitting: Physical Therapy

## 2023-08-04 DIAGNOSIS — R2689 Other abnormalities of gait and mobility: Secondary | ICD-10-CM | POA: Diagnosis not present

## 2023-08-04 DIAGNOSIS — R262 Difficulty in walking, not elsewhere classified: Secondary | ICD-10-CM | POA: Diagnosis not present

## 2023-08-04 DIAGNOSIS — M6281 Muscle weakness (generalized): Secondary | ICD-10-CM | POA: Diagnosis not present

## 2023-08-04 NOTE — Therapy (Signed)
 OUTPATIENT PHYSICAL THERAPY LOWER EXTREMITY TREATMENT   Patient Name: Christine Zimmerman MRN: [id number] DOB:07/12/1945, 78 y.o., female Today's Date: 08/04/2023  END OF SESSION:  PT End of Session - 08/04/23 1607     Visit Number 13    Number of Visits 18    Date for PT Re-Evaluation 10/18/23    Authorization Type [employer] 2025    Authorization Time Period auth 18 visits 1/28-4/1 auth# [id number]    Authorization - Visit Number 13    Authorization - Number of Visits 18    Progress Note Due on Visit 18    PT Start Time 1520    PT Stop Time 1600    PT Time Calculation (min) 40 min    Activity Tolerance Patient limited by pain    Behavior During Therapy WFL for tasks assessed/performed                      Past Medical History:  Diagnosis Date   Atrial fibrillation (HCC)    CHF (congestive heart failure) (HCC)    Chronic pain    Coronary artery disease    Diabetes mellitus without complication (HCC)    GERD (gastroesophageal reflux disease)    High cholesterol    Hypertension    Hypothyroidism    Presence of permanent cardiac pacemaker    Past Surgical History:  Procedure Laterality Date   BACK SURGERY     CARDIAC CATHETERIZATION Left 10/23/2015   Procedure: Left Heart Cath and Coronary Angiography;  Surgeon: Alwyn Pea, MD;  Location: [facility] INVASIVE CV LAB;  Service: Cardiovascular;  Laterality: Left;   COLONOSCOPY N/A 04/29/2022   Procedure: COLONOSCOPY;  Surgeon: Jaynie Collins, DO;  Location: [facility] ENDOSCOPY;  Service: Gastroenterology;  Laterality: N/A;   COLONOSCOPY WITH PROPOFOL N/A 01/03/2017   Procedure: COLONOSCOPY WITH PROPOFOL;  Surgeon: Scot Jun, MD;  Location: [facility] ENDOSCOPY;  Service: Endoscopy;  Laterality: N/A;   COLONOSCOPY WITH PROPOFOL N/A 04/12/2022   Procedure: COLONOSCOPY WITH PROPOFOL;  Surgeon: Jaynie Collins, DO;  Location: [facility] ENDOSCOPY;  Service: Gastroenterology;  Laterality: N/A;   CORONARY STENT  PLACEMENT     x 4 at [facility]. x 1 by [place] in 2004   INSERT / REPLACE / REMOVE PACEMAKER     LEFT HEART CATH AND CORONARY ANGIOGRAPHY N/A 06/08/2017   Procedure: LEFT HEART CATH AND CORONARY ANGIOGRAPHY;  Surgeon: Alwyn Pea, MD;  Location: [facility] INVASIVE CV LAB;  Service: Cardiovascular;  Laterality: N/A;   PACEMAKER IMPLANT N/A 11/03/2021   Procedure: PACEMAKER IMPLANT;  Surgeon: Marcina Millard, MD;  Location: [facility] INVASIVE CV LAB;  Service: Cardiovascular;  Laterality: N/A;   Patient Active Problem List   Diagnosis Date Noted   Sick sinus syndrome (HCC) 11/03/2021   Weakness generalized 05/09/2019   Chronic diastolic heart failure (HCC) 03/23/2018   HTN (hypertension) 03/23/2018   DM (diabetes mellitus) (HCC) 03/23/2018   SOB (shortness of breath) 02/01/2018   Dizziness 08/01/2017   TIA (transient ischemic attack) 10/17/2015   Hyponatremia 10/17/2015   Anemia 10/17/2015   Chest pain 10/16/2015   Left-sided weakness 10/16/2015   Hypotension 10/16/2015   Bradycardia 10/16/2015   Hypoglycemia 10/16/2015    PCP: Dr. Barbette Reichmann   REFERRING PROVIDER: Dr. Barbette Reichmann   REFERRING DIAG:  M25.569 (ICD-10-CM) - Pain in joint, lower leg  M25.561 (ICD-10-CM) - Pain in right knee  M25.562 (ICD-10-CM) - Pain in left knee  M79.609 (ICD-10-CM) - Pain in limb  M79.604 (ICD-10-CM) - Pain in right leg  M79.605 (ICD-10-CM) - Pain in left leg    THERAPY DIAG:  No diagnosis found.  Rationale for Evaluation and Treatment: Rehabilitation  ONSET DATE: 04/2019  SUBJECTIVE:   SUBJECTIVE STATEMENT: Pt states that she was able to take a walk outside yesterday. She did not mention feeling excessive pain after walk last session.   PERTINENT HISTORY: Pt reports ongoing knee pain that has been making it difficult for her to move and she has been steadily becoming less mobile. She used to go to water aerobics and that is where she got most of her exercise, but she is  afraid of falling and this keeps her from going to the class at the [facility]. Pt is a diabetic and she has diabetic neuropathy in her feet. She now has to use a single point cane to avoid from falling.  PAIN:  Are you having pain? Yes: NPRS scale: 2-3/10 Pain location: All around right knee  Pain description: Achy  Aggravating factors: Walking and standing or basically any weight bearing, stairs  Relieving factors: Vicks helps her go to sleep.   PRECAUTIONS: None  RED FLAGS: None   WEIGHT BEARING RESTRICTIONS: No  FALLS:  Has patient fallen in last 6 months? No  LIVING ENVIRONMENT: Lives with: lives with their spouse Lives in: House/apartment Stairs: Yes: External: 5 steps; on right going up Has following equipment at home: Single point cane and Walker - 2 wheeled  OCCUPATION: Retired   PLOF: Independent  PATIENT GOALS: To be able to walk without an assistive device and to get around her home and upstairs without help   NEXT MD VISIT: January 2025   OBJECTIVE:  Note: Objective measures were completed at Evaluation unless otherwise noted.  VITALS: 197/78 in sitting                 195/78 in sitting   DIAGNOSTIC FINDINGS:   AP and lateral x-rays of the lumbar spine were ordered and personally  reviewed today. These show extensive degenerative disc disease with  extensive osteophyte formation, mild SI arthritis on the AP view, no  scoliosis, pedicles intact. Lateral view shows severe degenerative changes  to the L4-5 disc space with extensive facet arthritis. No  spondylolisthesis, no fracture.  X-ray impression: Extensive degenerative disc and facet arthritis, lumbar  spine.   PATIENT SURVEYS:  FOTO 40 with target of    COGNITION: Overall cognitive status: Within functional limits for tasks assessed     SENSATION: Not tested  MUSCLE LENGTH:  Not performed   POSTURE: rounded shoulders  PALPATION: None performed    LOWER EXTREMITY ROM:  WFL   LOWER EXTREMITY  MMT:   (Blank rows = not tested)  LOWER EXTREMITY SPECIAL TESTS: None performed   FUNCTIONAL TESTS:  5 times sit to stand: 0 reps 30 seconds chair stand test: 0 reps  Timed up and go (TUG): Not tested  2 minute walk test: 170 ft with use of single point cane  10 meter walk test: NT  Dynamic Gait Index: NT  MCTSIB: Condition 1: Avg of 3 trials: NT sec, Condition 2: Avg of 3 trials: NT sec, Condition 3: Avg of 3 trials: NT sec, Condition 4: Avg of 3 trials: NT sec, and Total Score: NT   GAIT: Distance walked: 50 ft  Assistive device utilized: Single point cane Level of assistance: Modified independence Comments: Right sided antalgic gait  TREATMENT DATE:   08/04/23:  THEREX Nu-Step seat and arms at 7 for 5 min with resistance at 3  Step Ups on 4 inch step with BUE  2 x 10  Sit to Stands from 20 inch surface with 1 UE support 1 x 10  Seated Calf Stretch with grey band 2 x 60 sec   NMR  Step Overs Half Foam Roll x 5   10 m x 10 horizontal head turns   -mod VC to maintain gaze until next direction is given  10 m x 10 fast and slow  -Pt shows increased lateral sway when changing speeds  10 m x 10 change in direction    08/02/23: THEREX Nu-Step seat and arms at 7 for 8 min   THERAPEUTIC ACTIVITY  Overground walking with rollator outside in parking lot 1032 ft  -min VC to decrease speed down hills to avoid falling   -min VC to maintain close proximity to rollator  - supervision to CGA    07/27/23: THEREX  Nu-Step with seat at 14 for 6 min  Step Ups on 6 inch step using railing x 10  -mod VC step up with LLE and down with RLE   NMR  15 ft sit to to stand to 15 ft overground ambulation with obstacle step over ( 3x flattened ankle weights) while wearing #2 Ankle Weights x 6  -Pt struggles to clear obstacles 15 ft sit to to stand to 15 ft  overground ambulation with 2 obstacle step over ( 2 x flattened ankle weights) and 1 4 inch step up while wearing #2 Ankle Weights x 6  15 ft overground ambulation with practice for carrying can in left hand or strong side  x 10   07/20/23: THEREX  Nu-Step with seat and arms at 8 with resistance at 3 for 5 min  10 ft walks with change of direction at cone  x 10   PHYSICAL PERFORMANCE  : 250 ft with use of SPC  Able to negotiate 5 stairs with step to gait pattern with RLE going upwards and LLE going downwards and use of SPC  Timed Up and Go: Uses SPC  Trial 1: 22 sec  Trial 2: 22 sec    07/20/23 0001  Dynamic Gait Index  Level Surface 3  Change in Gait Speed 1  Gait with Horizontal Head Turns 2  Gait with Vertical Head Turns 2  Gait and Pivot Turn 2  Step Over Obstacle 0  Step Around Obstacles 3  Steps 1  Total Score 14     06/30/23: THEREX  Nu-Step with seat and arms at 7 with resistance at 3 for 5 min  Sit to Stand at 22 inch with 3 x 8 reps  -min VC to sequence exercise with nose over toes,   PHYSICAL PERFORMANCE  TUG  -Trial 1: 19.00 sec  -Trial 2: 16.14 sec  Average: 17.57 sec   DGI: 15/24  -Only used single point cane for the obstacle step over and the stairs  -Mild Impairment- Head turns and change in gait speed  -Moderate Impairment- Steps  -Severe Impairment- Step overs   NMR  Obstacle step over with 4 ankle weights serving as obstacles 10 M x 10  Standing Marches on LLE 2 x 10  -min VC to avoid compensation of internal and external rotation     PATIENT EDUCATION:  Education details: Educated pt on use of pain relief drugs to avoid limitations of pain and that  she should speak to her physician if she is concerned about what pain medications to take.   Person educated: Patient and Spouse Education method: Explanation, Demonstration, Verbal cues, and Handouts Education comprehension: verbalized understanding, returned demonstration, and verbal cues  required  HOME EXERCISE PROGRAM: Access Code: [email or web address] URL: [email or web address] Date: 08/02/2023 Prepared by: Ellin Goodie  Exercises - Walking  - 1 x daily - Long Sitting Calf Stretch with Strap  - 1 x daily - 3 reps - 60 sec sec  hold - Walk Turns  - 1 x daily - 10 reps - Sit to Stand with Counter Support  - 3-4 x weekly - 3 sets - 8 reps - Seated Plantar Fascia Mobilization with Small Ball  - 1 x daily - 7 x weekly - 10 reps - Forward Step Up  - 3-4 x weekly - 2 sets - 10 reps  ASSESSMENT:  CLINICAL IMPRESSION: Pt demonstrates improvement in dynamic balance with ability to perform change in direction, head turns, and change in speed with no loss in balance and only mild lateral sway with change in speed activity. She continues to be limited by knee pain especially with LE strengthening activities. She will continue to benefit from skilled PT to improve LE strength and aerobic endurance to improve mobility and decrease risk of falling while performing activities of daily living like grocery shopping or negotiating stairs.  OBJECTIVE IMPAIRMENTS: Abnormal gait, decreased balance, decreased endurance, difficulty walking, decreased strength, and pain.   ACTIVITY LIMITATIONS: carrying, lifting, bending, standing, squatting, stairs, transfers, and locomotion level  PARTICIPATION LIMITATIONS: meal prep, cleaning, laundry, driving, shopping, community activity, and church  PERSONAL FACTORS: Age, Past/current experiences, Time since onset of injury/illness/exacerbation, and 3+ comorbidities: HTN, T2DM, Afib  are also affecting patient's functional outcome.   REHAB POTENTIAL: Good  CLINICAL DECISION MAKING: Stable/uncomplicated  EVALUATION COMPLEXITY: Low   GOALS: Goals reviewed with patient? No  SHORT TERM GOALS: Target date: 05/23/2023  PT reviewed the following HEP with patient with patient able to demonstrate a set of the following with min cuing for correction  needed. PT educated patient on parameters of therex (how/when to inc/decrease intensity, frequency, rep/set range, stretch hold time, and purpose of therex) with verbalized understanding.  Baseline:NT  05/24/23: Performing independently   Goal status: ACHIEVED   2.  Patient will be able to perform a sit to stand without use of upper extremity support from 18 inch seated surface as evidence of improved LE strength.  Baseline: Needs to use hands 06/28/23: Needs to use hands still or push against knees 07/20/23: Needs to use one hand to push up  Goal status: ONGOING   3. Patient will demonstrate an improvement in DGI score of >=2 pts as evidence of improved dynamic balance to decrease risk of falling (Pardasaney et al, 2012).            Baseline: 11/24 06/30/23: 15/24 07/20/23: 14/24 Goal status: ACHIEVED     LONG TERM GOALS: Target date: 07/18/2023  Patient will have improved function and activity level as evidenced by an increase in FOTO score by 8 points or more.  Baseline: 40 with target of 48 06/28/23: 39 with target of 48 07/20/23: 0 reps  Goal status: ONGOING   2.  Patient will perform five sit to stand repetitions in <=15 secs as evidence of LE strength that shows that this community dwelling older adult that is older is at a decreased risk of falling. (Buatois 2010)   Baseline: 0 reps; needs  to use hands 06/28/23: 0 reps; needs to use hands 07/20/23: 0 reps  Goal status: ONGOING   3.  Patient will perform >=11 reps for 30 sec chair stands as evidence of improved LE endurance that decreases her risk of falling.  Baseline: 0 reps 06/28/23: 0 reps; needs to use hands  Goal status: ONGOING   4.  Patient will improve distance by >=40 ft for improved aerobic endurance and increased mobility to resume yard work outside [employer], 2009).  Baseline: 175 ft 06/28/23: 229 ft 07/20/23: 250 ft  Goal status: ACHIEVED   5.  Patient will be able to negotiate >=5 stairs without modified independence  as evidence of improved mobility and to decrease caregiver burden.  Baseline: Pt is safest using SPC to negotiate stairs  07/20/23: Able to negotiate 5 stairs with step to gait pattern with RLE going upwards and LLE going downwards and use of SPC  Goal status: Deferred   6. Patient will perform TUG in <13.5 sec as evidence of improved mobility and decreased risk for falling [facility] et al., 2000) Baseline: 20 sec 06/30/23: 17.57 sec 07/20/23: 22 sec  Goal status: ONGOING             7. Patient will demonstrate reduced falls risk as evidenced by Dynamic Gait Index (DGI) >19/24.  Baseline: 11/24 07/20/23: 14/24 Goal status: ONGOING    PLAN:  PT FREQUENCY: 1-2x/week  PT DURATION: 10 weeks  PLANNED INTERVENTIONS: 97164- PT Re-evaluation, 97110-Therapeutic exercises, 97530- Therapeutic activity, 97112- Neuromuscular re-education, 97535- Self Care, 09811- Manual therapy, 97116- Gait training, [phone]- Orthotic Fit/training, 97014- Electrical stimulation (unattended), [phone]- Electrical stimulation (manual), Patient/Family education, Balance training, Stair training, Dry Needling, Joint mobilization, Joint manipulation, Spinal manipulation, Spinal mobilization, Vestibular training, DME instructions, Cryotherapy, and Moist heat  PLAN FOR NEXT SESSION: TM warm up. Continue with LE strengthening with decreased knee flexion to avoid pain like box squats or reverse lunges or Leg Press.    Ellin Goodie PT, DPT  [facility] [address], [place], [address] Phone: [phone]   Fax:  [phone]

## 2023-08-09 ENCOUNTER — Ambulatory Visit: Payer: Medicare HMO | Admitting: Physical Therapy

## 2023-08-09 DIAGNOSIS — R262 Difficulty in walking, not elsewhere classified: Secondary | ICD-10-CM

## 2023-08-09 DIAGNOSIS — R2689 Other abnormalities of gait and mobility: Secondary | ICD-10-CM | POA: Diagnosis not present

## 2023-08-09 DIAGNOSIS — M6281 Muscle weakness (generalized): Secondary | ICD-10-CM | POA: Diagnosis not present

## 2023-08-09 NOTE — Therapy (Signed)
 OUTPATIENT PHYSICAL THERAPY LOWER EXTREMITY TREATMENT   Patient Name: Christine Zimmerman MRN: [id number] DOB:04/21/1946, 78 y.o., female Today's Date: 08/09/2023  END OF SESSION:  PT End of Session - 08/09/23 1412     Visit Number 14    Number of Visits 18    Date for PT Re-Evaluation 10/18/23    Authorization Type [employer] 2025    Authorization Time Period auth 18 visits 1/28-4/1 auth# [id number]    Authorization - Visit Number 14    Authorization - Number of Visits 18    Progress Note Due on Visit 18    PT Start Time 1345    PT Stop Time 1430    PT Time Calculation (min) 45 min    Equipment Utilized During Treatment Gait belt    Activity Tolerance Patient tolerated treatment well    Behavior During Therapy WFL for tasks assessed/performed                       Past Medical History:  Diagnosis Date   Atrial fibrillation (HCC)    CHF (congestive heart failure) (HCC)    Chronic pain    Coronary artery disease    Diabetes mellitus without complication (HCC)    GERD (gastroesophageal reflux disease)    High cholesterol    Hypertension    Hypothyroidism    Presence of permanent cardiac pacemaker    Past Surgical History:  Procedure Laterality Date   BACK SURGERY     CARDIAC CATHETERIZATION Left 10/23/2015   Procedure: Left Heart Cath and Coronary Angiography;  Surgeon: Alwyn Pea, MD;  Location: [facility] INVASIVE CV LAB;  Service: Cardiovascular;  Laterality: Left;   COLONOSCOPY N/A 04/29/2022   Procedure: COLONOSCOPY;  Surgeon: Jaynie Collins, DO;  Location: [facility] ENDOSCOPY;  Service: Gastroenterology;  Laterality: N/A;   COLONOSCOPY WITH PROPOFOL N/A 01/03/2017   Procedure: COLONOSCOPY WITH PROPOFOL;  Surgeon: Scot Jun, MD;  Location: [facility] ENDOSCOPY;  Service: Endoscopy;  Laterality: N/A;   COLONOSCOPY WITH PROPOFOL N/A 04/12/2022   Procedure: COLONOSCOPY WITH PROPOFOL;  Surgeon: Jaynie Collins, DO;  Location: [facility] ENDOSCOPY;  Service:  Gastroenterology;  Laterality: N/A;   CORONARY STENT PLACEMENT     x 4 at [facility]. x 1 by [place] in 2004   INSERT / REPLACE / REMOVE PACEMAKER     LEFT HEART CATH AND CORONARY ANGIOGRAPHY N/A 06/08/2017   Procedure: LEFT HEART CATH AND CORONARY ANGIOGRAPHY;  Surgeon: Alwyn Pea, MD;  Location: [facility] INVASIVE CV LAB;  Service: Cardiovascular;  Laterality: N/A;   PACEMAKER IMPLANT N/A 11/03/2021   Procedure: PACEMAKER IMPLANT;  Surgeon: Marcina Millard, MD;  Location: [facility] INVASIVE CV LAB;  Service: Cardiovascular;  Laterality: N/A;   Patient Active Problem List   Diagnosis Date Noted   Sick sinus syndrome (HCC) 11/03/2021   Weakness generalized 05/09/2019   Chronic diastolic heart failure (HCC) 03/23/2018   HTN (hypertension) 03/23/2018   DM (diabetes mellitus) (HCC) 03/23/2018   SOB (shortness of breath) 02/01/2018   Dizziness 08/01/2017   TIA (transient ischemic attack) 10/17/2015   Hyponatremia 10/17/2015   Anemia 10/17/2015   Chest pain 10/16/2015   Left-sided weakness 10/16/2015   Hypotension 10/16/2015   Bradycardia 10/16/2015   Hypoglycemia 10/16/2015    PCP: Dr. Barbette Reichmann   REFERRING PROVIDER: Dr. Barbette Reichmann   REFERRING DIAG:  M25.569 (ICD-10-CM) - Pain in joint, lower leg  M25.561 (ICD-10-CM) - Pain in right knee  M25.562 (ICD-10-CM) - Pain in  left knee  M79.609 (ICD-10-CM) - Pain in limb  M79.604 (ICD-10-CM) - Pain in right leg  M79.605 (ICD-10-CM) - Pain in left leg    THERAPY DIAG:  No diagnosis found.  Rationale for Evaluation and Treatment: Rehabilitation  ONSET DATE: 04/2019  SUBJECTIVE:   SUBJECTIVE STATEMENT: Pt reports that she continues to have knee pain and that she has not been walking or exercising as much because of the rain.   PERTINENT HISTORY: Pt reports ongoing knee pain that has been making it difficult for her to move and she has been steadily becoming less mobile. She used to go to water aerobics and that  is where she got most of her exercise, but she is afraid of falling and this keeps her from going to the class at the [facility]. Pt is a diabetic and she has diabetic neuropathy in her feet. She now has to use a single point cane to avoid from falling.  PAIN:  Are you having pain? Yes: NPRS scale: 2-3/10 Pain location: All around right knee  Pain description: Achy  Aggravating factors: Walking and standing or basically any weight bearing, stairs  Relieving factors: Vicks helps her go to sleep.   PRECAUTIONS: None  RED FLAGS: None   WEIGHT BEARING RESTRICTIONS: No  FALLS:  Has patient fallen in last 6 months? No  LIVING ENVIRONMENT: Lives with: lives with their spouse Lives in: House/apartment Stairs: Yes: External: 5 steps; on right going up Has following equipment at home: Single point cane and Walker - 2 wheeled  OCCUPATION: Retired   PLOF: Independent  PATIENT GOALS: To be able to walk without an assistive device and to get around her home and upstairs without help   NEXT MD VISIT: January 2025   OBJECTIVE:  Note: Objective measures were completed at Evaluation unless otherwise noted.  VITALS: 197/78 in sitting                 195/78 in sitting   DIAGNOSTIC FINDINGS:   AP and lateral x-rays of the lumbar spine were ordered and personally  reviewed today. These show extensive degenerative disc disease with  extensive osteophyte formation, mild SI arthritis on the AP view, no  scoliosis, pedicles intact. Lateral view shows severe degenerative changes  to the L4-5 disc space with extensive facet arthritis. No  spondylolisthesis, no fracture.  X-ray impression: Extensive degenerative disc and facet arthritis, lumbar  spine.   PATIENT SURVEYS:  FOTO 40 with target of    COGNITION: Overall cognitive status: Within functional limits for tasks assessed     SENSATION: Not tested  MUSCLE LENGTH:  Not performed   POSTURE: rounded shoulders  PALPATION: None performed     LOWER EXTREMITY ROM:  WFL   LOWER EXTREMITY MMT:   (Blank rows = not tested)  LOWER EXTREMITY SPECIAL TESTS: None performed   FUNCTIONAL TESTS:  5 times sit to stand: 0 reps 30 seconds chair stand test: 0 reps  Timed up and go (TUG): Not tested  2 minute walk test: 170 ft with use of single point cane  10 meter walk test: NT  Dynamic Gait Index: NT  MCTSIB: Condition 1: Avg of 3 trials: NT sec, Condition 2: Avg of 3 trials: NT sec, Condition 3: Avg of 3 trials: NT sec, Condition 4: Avg of 3 trials: NT sec, and Total Score: NT   GAIT: Distance walked: 50 ft  Assistive device utilized: Single point cane Level of assistance: Modified independence Comments: Right sided antalgic  gait                                                                                                                                 TREATMENT DATE:   08/09/23:  THEREX Nu-Step seat and arms at 7 for 5 min with resistance at 3   Mini Squat with BUE support using rollator 1 x 10  -min to maintain knees behind toes  Standing Hip Abduction with BUE support using rollator  Standing HS curl with #3 AW with BUE support using rollator  2 x 10  Standing Marches with BUE support 2 x 10   THERAPEUTIC ACTIVITY  585 ft overground ambulation outside with rollator  -min VC to maintain close proximity to walker  200 ft overground ambulation inside gym with rollator  200 ft overground ambulation inside gym with #3 AW with rollator    SELF CARE HOME MANAGEMENT  Discussion about initiating walking program and to start with 10 minutes per day for 3 x per week      08/04/23:  THEREX Nu-Step seat and arms at 7 for 5 min with resistance at 3  Step Ups on 4 inch step with BUE  2 x 10  Sit to Stands from 20 inch surface with 1 UE support 1 x 10  Seated Calf Stretch with grey band 2 x 60 sec   NMR  Step Overs Half Foam Roll x 5   10 m x 10 horizontal head turns   -mod VC to maintain gaze until next direction  is given  10 m x 10 fast and slow  -Pt shows increased lateral sway when changing speeds  10 m x 10 change in direction    08/02/23: THEREX Nu-Step seat and arms at 7 for 8 min   THERAPEUTIC ACTIVITY  Overground walking with rollator outside in parking lot 1032 ft  -min VC to decrease speed down hills to avoid falling   -min VC to maintain close proximity to rollator  - supervision to CGA    07/27/23: THEREX  Nu-Step with seat at 14 for 6 min  Step Ups on 6 inch step using railing x 10  -mod VC step up with LLE and down with RLE   NMR  15 ft sit to to stand to 15 ft overground ambulation with obstacle step over ( 3x flattened ankle weights) while wearing #2 Ankle Weights x 6  -Pt struggles to clear obstacles 15 ft sit to to stand to 15 ft overground ambulation with 2 obstacle step over ( 2 x flattened ankle weights) and 1 4 inch step up while wearing #2 Ankle Weights x 6  15 ft overground ambulation with practice for carrying can in left hand or strong side  x 10   07/20/23: THEREX  Nu-Step with seat and arms at 8 with resistance at 3 for 5 min  10 ft walks with change of direction at cone  x 10   PHYSICAL PERFORMANCE  : 250 ft with use of SPC  Able to negotiate 5 stairs with step to gait pattern with RLE going upwards and LLE going downwards and use of SPC  Timed Up and Go: Uses SPC  Trial 1: 22 sec  Trial 2: 22 sec    07/20/23 0001  Dynamic Gait Index  Level Surface 3  Change in Gait Speed 1  Gait with Horizontal Head Turns 2  Gait with Vertical Head Turns 2  Gait and Pivot Turn 2  Step Over Obstacle 0  Step Around Obstacles 3  Steps 1  Total Score 14       PATIENT EDUCATION:  Education details: Educated pt on use of pain relief drugs to avoid limitations of pain and that she should speak to her physician if she is concerned about what pain medications to take.   Person educated: Patient and Spouse Education method: Explanation, Demonstration, Verbal cues,  and Handouts Education comprehension: verbalized understanding, returned demonstration, and verbal cues required  HOME EXERCISE PROGRAM: Access Code: [email or web address] URL: [email or web address] Date: 08/02/2023 Prepared by: Ellin Goodie  Exercises - Walking  - 1 x daily - Long Sitting Calf Stretch with Strap  - 1 x daily - 3 reps - 60 sec sec  hold - Walk Turns  - 1 x daily - 10 reps - Sit to Stand with Counter Support  - 3-4 x weekly - 3 sets - 8 reps - Seated Plantar Fascia Mobilization with Small Ball  - 1 x daily - 7 x weekly - 10 reps - Forward Step Up  - 3-4 x weekly - 2 sets - 10 reps  ASSESSMENT:  CLINICAL IMPRESSION: Pt shows improvement in mobility with ability to ambulate for an increased distance and to perform standing strengthening and balance exercises. Pt was not limited by knee pain during the session. She will continue to benefit from skilled PT to improve LE strength and aerobic endurance to improve mobility and decrease risk of falling while performing activities of daily living like grocery shopping or negotiating stairs.  OBJECTIVE IMPAIRMENTS: Abnormal gait, decreased balance, decreased endurance, difficulty walking, decreased strength, and pain.   ACTIVITY LIMITATIONS: carrying, lifting, bending, standing, squatting, stairs, transfers, and locomotion level  PARTICIPATION LIMITATIONS: meal prep, cleaning, laundry, driving, shopping, community activity, and church  PERSONAL FACTORS: Age, Past/current experiences, Time since onset of injury/illness/exacerbation, and 3+ comorbidities: HTN, T2DM, Afib  are also affecting patient's functional outcome.   REHAB POTENTIAL: Good  CLINICAL DECISION MAKING: Stable/uncomplicated  EVALUATION COMPLEXITY: Low   GOALS: Goals reviewed with patient? No  SHORT TERM GOALS: Target date: 05/23/2023  PT reviewed the following HEP with patient with patient able to demonstrate a set of the following with min cuing for  correction needed. PT educated patient on parameters of therex (how/when to inc/decrease intensity, frequency, rep/set range, stretch hold time, and purpose of therex) with verbalized understanding.  Baseline:NT  05/24/23: Performing independently   Goal status: ACHIEVED   2.  Patient will be able to perform a sit to stand without use of upper extremity support from 18 inch seated surface as evidence of improved LE strength.  Baseline: Needs to use hands 06/28/23: Needs to use hands still or push against knees 07/20/23: Needs to use one hand to push up  Goal status: ONGOING   3. Patient will demonstrate an improvement in DGI score of >=2 pts as evidence of improved dynamic balance to decrease risk of falling (Pardasaney et  al, 2012).            Baseline: 11/24 06/30/23: 15/24 07/20/23: 14/24 Goal status: ACHIEVED     LONG TERM GOALS: Target date: 07/18/2023  Patient will have improved function and activity level as evidenced by an increase in FOTO score by 8 points or more.  Baseline: 40 with target of 48 06/28/23: 39 with target of 48 07/20/23: 0 reps  Goal status: ONGOING   2.  Patient will perform five sit to stand repetitions in <=15 secs as evidence of LE strength that shows that this community dwelling older adult that is older is at a decreased risk of falling. (Buatois 2010)   Baseline: 0 reps; needs to use hands 06/28/23: 0 reps; needs to use hands 07/20/23: 0 reps  Goal status: ONGOING   3.  Patient will perform >=11 reps for 30 sec chair stands as evidence of improved LE endurance that decreases her risk of falling.  Baseline: 0 reps 06/28/23: 0 reps; needs to use hands  Goal status: ONGOING   4.  Patient will improve distance by >=40 ft for improved aerobic endurance and increased mobility to resume yard work outside [employer], 2009).  Baseline: 175 ft 06/28/23: 229 ft 07/20/23: 250 ft  Goal status: ACHIEVED   5.  Patient will be able to negotiate >=5 stairs without modified  independence as evidence of improved mobility and to decrease caregiver burden.  Baseline: Pt is safest using SPC to negotiate stairs  07/20/23: Able to negotiate 5 stairs with step to gait pattern with RLE going upwards and LLE going downwards and use of SPC  Goal status: Deferred   6. Patient will perform TUG in <13.5 sec as evidence of improved mobility and decreased risk for falling [facility] et al., 2000) Baseline: 20 sec 06/30/23: 17.57 sec 07/20/23: 22 sec  Goal status: ONGOING             7. Patient will demonstrate reduced falls risk as evidenced by Dynamic Gait Index (DGI) >19/24.  Baseline: 11/24 07/20/23: 14/24 Goal status: ONGOING    PLAN:  PT FREQUENCY: 1-2x/week  PT DURATION: 10 weeks  PLANNED INTERVENTIONS: 97164- PT Re-evaluation, 97110-Therapeutic exercises, 97530- Therapeutic activity, 97112- Neuromuscular re-education, 97535- Self Care, 29562- Manual therapy, 97116- Gait training, [phone]- Orthotic Fit/training, 97014- Electrical stimulation (unattended), [phone]- Electrical stimulation (manual), Patient/Family education, Balance training, Stair training, Dry Needling, Joint mobilization, Joint manipulation, Spinal manipulation, Spinal mobilization, Vestibular training, DME instructions, Cryotherapy, and Moist heat  PLAN FOR NEXT SESSION: Reassess goals. TM warm up. Hip ER Stretch. Continue with LE strengthening with decreased knee flexion to avoid pain like box squats or reverse lunges or Leg Press.    Ellin Goodie PT, DPT  [facility] [address] [place], [address] Phone: [phone]   Fax:  [phone]

## 2023-08-11 ENCOUNTER — Ambulatory Visit: Payer: Medicare HMO | Admitting: Physical Therapy

## 2023-08-11 DIAGNOSIS — R2689 Other abnormalities of gait and mobility: Secondary | ICD-10-CM

## 2023-08-11 DIAGNOSIS — R262 Difficulty in walking, not elsewhere classified: Secondary | ICD-10-CM | POA: Diagnosis not present

## 2023-08-11 DIAGNOSIS — M6281 Muscle weakness (generalized): Secondary | ICD-10-CM

## 2023-08-11 NOTE — Therapy (Signed)
 OUTPATIENT PHYSICAL THERAPY LOWER EXTREMITY TREATMENT   Patient Name: Christine Zimmerman MRN: [id number] DOB:02-14-1946, 78 y.o., female Today's Date: 08/11/2023  END OF SESSION:  PT End of Session - 08/11/23 1739     Visit Number 15    Number of Visits 18    Date for PT Re-Evaluation 10/18/23    Authorization Type [employer] 2025    Authorization Time Period auth 18 visits 1/28-4/1 auth# [id number]    Authorization - Visit Number 15    Authorization - Number of Visits 18    Progress Note Due on Visit 18    PT Start Time 1645    PT Stop Time 1730    PT Time Calculation (min) 45 min    Equipment Utilized During Treatment Gait belt    Activity Tolerance Patient tolerated treatment well    Behavior During Therapy WFL for tasks assessed/performed                        Past Medical History:  Diagnosis Date   Atrial fibrillation (HCC)    CHF (congestive heart failure) (HCC)    Chronic pain    Coronary artery disease    Diabetes mellitus without complication (HCC)    GERD (gastroesophageal reflux disease)    High cholesterol    Hypertension    Hypothyroidism    Presence of permanent cardiac pacemaker    Past Surgical History:  Procedure Laterality Date   BACK SURGERY     CARDIAC CATHETERIZATION Left 10/23/2015   Procedure: Left Heart Cath and Coronary Angiography;  Surgeon: Alwyn Pea, MD;  Location: [facility] INVASIVE CV LAB;  Service: Cardiovascular;  Laterality: Left;   COLONOSCOPY N/A 04/29/2022   Procedure: COLONOSCOPY;  Surgeon: Jaynie Collins, DO;  Location: [facility] ENDOSCOPY;  Service: Gastroenterology;  Laterality: N/A;   COLONOSCOPY WITH PROPOFOL N/A 01/03/2017   Procedure: COLONOSCOPY WITH PROPOFOL;  Surgeon: Scot Jun, MD;  Location: [facility] ENDOSCOPY;  Service: Endoscopy;  Laterality: N/A;   COLONOSCOPY WITH PROPOFOL N/A 04/12/2022   Procedure: COLONOSCOPY WITH PROPOFOL;  Surgeon: Jaynie Collins, DO;  Location: [facility] ENDOSCOPY;   Service: Gastroenterology;  Laterality: N/A;   CORONARY STENT PLACEMENT     x 4 at [facility]. x 1 by [place] in 2004   INSERT / REPLACE / REMOVE PACEMAKER     LEFT HEART CATH AND CORONARY ANGIOGRAPHY N/A 06/08/2017   Procedure: LEFT HEART CATH AND CORONARY ANGIOGRAPHY;  Surgeon: Alwyn Pea, MD;  Location: [facility] INVASIVE CV LAB;  Service: Cardiovascular;  Laterality: N/A;   PACEMAKER IMPLANT N/A 11/03/2021   Procedure: PACEMAKER IMPLANT;  Surgeon: Marcina Millard, MD;  Location: [facility] INVASIVE CV LAB;  Service: Cardiovascular;  Laterality: N/A;   Patient Active Problem List   Diagnosis Date Noted   Sick sinus syndrome (HCC) 11/03/2021   Weakness generalized 05/09/2019   Chronic diastolic heart failure (HCC) 03/23/2018   HTN (hypertension) 03/23/2018   DM (diabetes mellitus) (HCC) 03/23/2018   SOB (shortness of breath) 02/01/2018   Dizziness 08/01/2017   TIA (transient ischemic attack) 10/17/2015   Hyponatremia 10/17/2015   Anemia 10/17/2015   Chest pain 10/16/2015   Left-sided weakness 10/16/2015   Hypotension 10/16/2015   Bradycardia 10/16/2015   Hypoglycemia 10/16/2015    PCP: Dr. Barbette Reichmann   REFERRING PROVIDER: Dr. Barbette Reichmann   REFERRING DIAG:  M25.569 (ICD-10-CM) - Pain in joint, lower leg  M25.561 (ICD-10-CM) - Pain in right knee  M25.562 (ICD-10-CM) - Pain  in left knee  M79.609 (ICD-10-CM) - Pain in limb  M79.604 (ICD-10-CM) - Pain in right leg  M79.605 (ICD-10-CM) - Pain in left leg    THERAPY DIAG:  No diagnosis found.  Rationale for Evaluation and Treatment: Rehabilitation  ONSET DATE: 04/2019  SUBJECTIVE:   SUBJECTIVE STATEMENT: Pt states that she is concerned about bill she received and she believes it is incorrect. PT remained her that her copay is $25 per visit and that she should talk with billing about the issues she has. She felt an incredible amount of leg pain last visit and low back pain, but she is not sure what caused  this.   PERTINENT HISTORY: Pt reports ongoing knee pain that has been making it difficult for her to move and she has been steadily becoming less mobile. She used to go to water aerobics and that is where she got most of her exercise, but she is afraid of falling and this keeps her from going to the class at the [facility]. Pt is a diabetic and she has diabetic neuropathy in her feet. She now has to use a single point cane to avoid from falling.  PAIN:  Are you having pain? Yes: NPRS scale: 2-3/10 Pain location: All around right knee  Pain description: Achy  Aggravating factors: Walking and standing or basically any weight bearing, stairs  Relieving factors: Vicks helps her go to sleep.   PRECAUTIONS: None  RED FLAGS: None   WEIGHT BEARING RESTRICTIONS: No  FALLS:  Has patient fallen in last 6 months? No  LIVING ENVIRONMENT: Lives with: lives with their spouse Lives in: House/apartment Stairs: Yes: External: 5 steps; on right going up Has following equipment at home: Single point cane and Walker - 2 wheeled  OCCUPATION: Retired   PLOF: Independent  PATIENT GOALS: To be able to walk without an assistive device and to get around her home and upstairs without help   NEXT MD VISIT: January 2025   OBJECTIVE:  Note: Objective measures were completed at Evaluation unless otherwise noted.  VITALS: 197/78 in sitting                 195/78 in sitting   DIAGNOSTIC FINDINGS:   AP and lateral x-rays of the lumbar spine were ordered and personally  reviewed today. These show extensive degenerative disc disease with  extensive osteophyte formation, mild SI arthritis on the AP view, no  scoliosis, pedicles intact. Lateral view shows severe degenerative changes  to the L4-5 disc space with extensive facet arthritis. No  spondylolisthesis, no fracture.  X-ray impression: Extensive degenerative disc and facet arthritis, lumbar  spine.   PATIENT SURVEYS:  FOTO 40 with target of     COGNITION: Overall cognitive status: Within functional limits for tasks assessed     SENSATION: Not tested  MUSCLE LENGTH:  Not performed   POSTURE: rounded shoulders  PALPATION: None performed    LOWER EXTREMITY ROM:  WFL   LOWER EXTREMITY MMT:   (Blank rows = not tested)  LOWER EXTREMITY SPECIAL TESTS: None performed   FUNCTIONAL TESTS:  5 times sit to stand: 0 reps 30 seconds chair stand test: 0 reps  Timed up and go (TUG): Not tested  2 minute walk test: 170 ft with use of single point cane  10 meter walk test: NT  Dynamic Gait Index: NT  MCTSIB: Condition 1: Avg of 3 trials: NT sec, Condition 2: Avg of 3 trials: NT sec, Condition 3: Avg of 3 trials:  NT sec, Condition 4: Avg of 3 trials: NT sec, and Total Score: NT   GAIT: Distance walked: 50 ft  Assistive device utilized: Single point cane Level of assistance: Modified independence Comments: Right sided antalgic gait                                                                                                                                 TREATMENT DATE:   08/11/23: THEREX  Nu-Step seat and arms at 7 for 6 min with resistance at 3  Sit to Stand from 18 inch mat height 1 x 5  Standing HS curls #3 AW 2 x 10  -Pt reports increased knee pain    PHYSICAL PERFORMANCE   5 xSTS: 3 reps 30 sec chair stands: 3 reps      08/09/23 0001  Dynamic Gait Index  Level Surface 3  Change in Gait Speed 2  Gait with Horizontal Head Turns 3  Gait with Vertical Head Turns 3  Gait and Pivot Turn 3  Step Over Obstacle 0  Step Around Obstacles 3  Steps 1  Total Score 18   TUG (without AD): 16 sec    08/09/23:  THEREX Nu-Step seat and arms at 7 for 5 min with resistance at 3   Mini Squat with BUE support using rollator 1 x 10  -min to maintain knees behind toes  Standing Hip Abduction with BUE support using rollator  Standing HS curl with #3 AW with BUE support using rollator  2 x 10  Standing Marches with  BUE support 2 x 10   THERAPEUTIC ACTIVITY  585 ft overground ambulation outside with rollator  -min VC to maintain close proximity to walker  200 ft overground ambulation inside gym with rollator  200 ft overground ambulation inside gym with #3 AW with rollator    SELF CARE HOME MANAGEMENT  Discussion about initiating walking program and to start with 10 minutes per day for 3 x per week    08/04/23:  THEREX Nu-Step seat and arms at 7 for 5 min with resistance at 3  Step Ups on 4 inch step with BUE  2 x 10  Sit to Stands from 20 inch surface with 1 UE support 1 x 10  Seated Calf Stretch with grey band 2 x 60 sec   NMR  Step Overs Half Foam Roll x 5   10 m x 10 horizontal head turns   -mod VC to maintain gaze until next direction is given  10 m x 10 fast and slow  -Pt shows increased lateral sway when changing speeds  10 m x 10 change in direction    08/02/23: THEREX Nu-Step seat and arms at 7 for 8 min   THERAPEUTIC ACTIVITY  Overground walking with rollator outside in parking lot 1032 ft  -min VC to decrease speed down hills to avoid falling   -min VC to maintain close proximity to  rollator  - supervision to [facility]    07/27/23: THEREX  Nu-Step with seat at 14 for 6 min  Step Ups on 6 inch step using railing x 10  -mod VC step up with LLE and down with RLE   NMR  15 ft sit to to stand to 15 ft overground ambulation with obstacle step over ( 3x flattened ankle weights) while wearing #2 Ankle Weights x 6  -Pt struggles to clear obstacles 15 ft sit to to stand to 15 ft overground ambulation with 2 obstacle step over ( 2 x flattened ankle weights) and 1 4 inch step up while wearing #2 Ankle Weights x 6  15 ft overground ambulation with practice for carrying can in left hand or strong side  x 10        PATIENT EDUCATION:  Education details: Educated pt on use of pain relief drugs to avoid limitations of pain and that she should speak to her physician if she is concerned  about what pain medications to take.   Person educated: Patient and Spouse Education method: Explanation, Demonstration, Verbal cues, and Handouts Education comprehension: verbalized understanding, returned demonstration, and verbal cues required  HOME EXERCISE PROGRAM: Access Code: [email or web address] URL: [email or web address] Date: 08/11/2023 Prepared by: Ellin Goodie  Exercises - Walking  - 1 x daily - Long Sitting Calf Stretch with Strap  - 1 x daily - 3 reps - 60 sec sec  hold - Seated Plantar Fascia Mobilization with Small Ball  - 1 x daily - 7 x weekly - 10 reps - Sit to Stand  - 3-4 x weekly - 3 sets - 5 reps - Standing Marching  - 3-4 x weekly - 3 sets - 10 reps  ASSESSMENT:  CLINICAL IMPRESSION: Pt shows improved dynamic balance and LE strength and improved mobility. She continues to be limited by knee OA with decreased knee flexion with ambulation. Pt still lack LE strength and endurance that makes her at increased resistance for falls. She will continue to benefit from skilled PT to improve LE strength and aerobic endurance to improve mobility and decrease risk of falling while performing activities of daily living like grocery shopping or negotiating stairs.  OBJECTIVE IMPAIRMENTS: Abnormal gait, decreased balance, decreased endurance, difficulty walking, decreased strength, and pain.   ACTIVITY LIMITATIONS: carrying, lifting, bending, standing, squatting, stairs, transfers, and locomotion level  PARTICIPATION LIMITATIONS: meal prep, cleaning, laundry, driving, shopping, community activity, and church  PERSONAL FACTORS: Age, Past/current experiences, Time since onset of injury/illness/exacerbation, and 3+ comorbidities: HTN, T2DM, Afib  are also affecting patient's functional outcome.   REHAB POTENTIAL: Good  CLINICAL DECISION MAKING: Stable/uncomplicated  EVALUATION COMPLEXITY: Low   GOALS: Goals reviewed with patient? No  SHORT TERM GOALS: Target date:  05/23/2023  PT reviewed the following HEP with patient with patient able to demonstrate a set of the following with min cuing for correction needed. PT educated patient on parameters of therex (how/when to inc/decrease intensity, frequency, rep/set range, stretch hold time, and purpose of therex) with verbalized understanding.  Baseline:NT  05/24/23: Performing independently   Goal status: ACHIEVED   2.  Patient will be able to perform a sit to stand without use of upper extremity support from 18 inch seated surface as evidence of improved LE strength.  Baseline: Needs to use hands 06/28/23: Needs to use hands still or push against knees 07/20/23: Needs to use one hand to push up 08/11/23: 3 reps  Goal status: ACHIEVED   3. Patient will demonstrate  an improvement in DGI score of >=2 pts as evidence of improved dynamic balance to decrease risk of falling (P[place]asaney et al, 2012).            Baseline: 11/24 06/30/23: 15/24 07/20/23: 14/24 Goal status: ACHIEVED     LONG TERM GOALS: Target date: 07/18/2023  Patient will have improved function and activity level as evidenced by an increase in FOTO score by 8 points or more.  Baseline: 40 with target of 48 06/28/23: 39 with target of 48 07/20/23: 0 reps  Goal status: ONGOING   2.  Patient will perform five sit to stand repetitions in <=15 secs as evidence of LE strength that shows that this community dwelling older adult that is older is at a decreased risk of falling. (Buatois 2010)   Baseline: 0 reps; needs to use hands 06/28/23: 0 reps; needs to use hands 07/20/23: 0 reps 08/09/23: 3 reps  Goal status: ONGOING   3.  Patient will perform >=11 reps for 30 sec chair stands as evidence of improved LE endurance that decreases her risk of falling.  Baseline: 0 reps 06/28/23: 0 reps; needs to use hands 08/11/23: 3 reps  Goal status: ONGOING   4.  Patient will improve distance by >=40 ft for improved aerobic endurance and increased mobility to resume yard work  outside [employer], 2009).  Baseline: 175 ft 06/28/23: 229 ft 07/20/23: 250 ft  Goal status: ACHIEVED   5.  Patient will be able to negotiate >=5 stairs without modified independence as evidence of improved mobility and to decrease caregiver burden.  Baseline: Pt is safest using SPC to negotiate stairs  07/20/23: Able to negotiate 5 stairs with step to gait pattern with RLE going upwards and LLE going downwards and use of SPC  Goal status: Deferred   6. Patient will perform TUG in <13.5 sec as evidence of improved mobility and decreased risk for falling [place] et al., 2000) Baseline: 20 sec 06/30/23: 17.57 sec 07/20/23: 22 sec 08/11/23: 16 sec  Goal status: PARTIALLY MET             7. Patient will demonstrate reduced falls risk as evidenced by Dynamic Gait Index (DGI) >19/24.  Baseline: 11/24 07/20/23: 14/24 08/11/23: 18/24 Goal status: ONGOING    PLAN:  PT FREQUENCY: 1-2x/week  PT DURATION: 10 weeks  PLANNED INTERVENTIONS: 97164- PT Re-evaluation, 97110-Therapeutic exercises, 97530- Therapeutic activity, 97112- Neuromuscular re-education, 97535- Self Care, 16109- Manual therapy, 97116- Gait training, [phone]- Orthotic Fit/training, 97014- Electrical stimulation (unattended), [phone]- Electrical stimulation (manual), Patient/Family education, Balance training, Stair training, Dry Needling, Joint mobilization, Joint manipulation, Spinal manipulation, Spinal mobilization, Vestibular training, DME instructions, Cryotherapy, and Moist heat  PLAN FOR NEXT SESSION: TM warm up. Hip ER Stretch. Leg Press, Sit to [employer], and Standing HS curls.   Ellin Goodie PT, DPT  [facility] [address] [place], [address] Phone: [phone]   Fax:  [phone]

## 2023-08-16 ENCOUNTER — Ambulatory Visit: Admitting: Physical Therapy

## 2023-08-17 ENCOUNTER — Encounter: Payer: Medicare HMO | Admitting: Physical Therapy

## 2023-08-22 ENCOUNTER — Encounter: Payer: Medicare HMO | Admitting: Physical Therapy

## 2023-08-24 ENCOUNTER — Encounter: Payer: Medicare HMO | Admitting: Physical Therapy

## 2023-08-24 DIAGNOSIS — R252 Cramp and spasm: Secondary | ICD-10-CM | POA: Diagnosis not present

## 2023-08-24 DIAGNOSIS — R112 Nausea with vomiting, unspecified: Secondary | ICD-10-CM | POA: Diagnosis not present

## 2023-08-24 DIAGNOSIS — Z8673 Personal history of transient ischemic attack (TIA), and cerebral infarction without residual deficits: Secondary | ICD-10-CM | POA: Diagnosis not present

## 2023-08-24 DIAGNOSIS — G8929 Other chronic pain: Secondary | ICD-10-CM | POA: Diagnosis not present

## 2023-08-24 DIAGNOSIS — Z79899 Other long term (current) drug therapy: Secondary | ICD-10-CM | POA: Diagnosis not present

## 2023-08-24 DIAGNOSIS — R519 Headache, unspecified: Secondary | ICD-10-CM | POA: Diagnosis not present

## 2023-08-24 DIAGNOSIS — M545 Low back pain, unspecified: Secondary | ICD-10-CM | POA: Diagnosis not present

## 2023-08-24 DIAGNOSIS — M19049 Primary osteoarthritis, unspecified hand: Secondary | ICD-10-CM | POA: Diagnosis not present

## 2023-08-29 DIAGNOSIS — I2510 Atherosclerotic heart disease of native coronary artery without angina pectoris: Secondary | ICD-10-CM | POA: Diagnosis not present

## 2023-08-29 DIAGNOSIS — E7800 Pure hypercholesterolemia, unspecified: Secondary | ICD-10-CM | POA: Diagnosis not present

## 2023-08-29 DIAGNOSIS — R262 Difficulty in walking, not elsewhere classified: Secondary | ICD-10-CM | POA: Diagnosis not present

## 2023-08-29 DIAGNOSIS — E039 Hypothyroidism, unspecified: Secondary | ICD-10-CM | POA: Diagnosis not present

## 2023-08-29 DIAGNOSIS — F330 Major depressive disorder, recurrent, mild: Secondary | ICD-10-CM | POA: Diagnosis not present

## 2023-08-29 DIAGNOSIS — E1165 Type 2 diabetes mellitus with hyperglycemia: Secondary | ICD-10-CM | POA: Diagnosis not present

## 2023-08-29 DIAGNOSIS — D75839 Thrombocytosis, unspecified: Secondary | ICD-10-CM | POA: Diagnosis not present

## 2023-08-29 DIAGNOSIS — G5603 Carpal tunnel syndrome, bilateral upper limbs: Secondary | ICD-10-CM | POA: Diagnosis not present

## 2023-08-29 DIAGNOSIS — I10 Essential (primary) hypertension: Secondary | ICD-10-CM | POA: Diagnosis not present

## 2023-09-05 DIAGNOSIS — Z1231 Encounter for screening mammogram for malignant neoplasm of breast: Secondary | ICD-10-CM | POA: Diagnosis not present

## 2023-09-05 DIAGNOSIS — I5030 Unspecified diastolic (congestive) heart failure: Secondary | ICD-10-CM | POA: Diagnosis not present

## 2023-09-05 DIAGNOSIS — I110 Hypertensive heart disease with heart failure: Secondary | ICD-10-CM | POA: Diagnosis not present

## 2023-09-05 DIAGNOSIS — E1165 Type 2 diabetes mellitus with hyperglycemia: Secondary | ICD-10-CM | POA: Diagnosis not present

## 2023-09-05 DIAGNOSIS — E1140 Type 2 diabetes mellitus with diabetic neuropathy, unspecified: Secondary | ICD-10-CM | POA: Diagnosis not present

## 2023-09-05 DIAGNOSIS — M1812 Unilateral primary osteoarthritis of first carpometacarpal joint, left hand: Secondary | ICD-10-CM | POA: Diagnosis not present

## 2023-09-05 DIAGNOSIS — E1169 Type 2 diabetes mellitus with other specified complication: Secondary | ICD-10-CM | POA: Diagnosis not present

## 2023-09-05 DIAGNOSIS — R7989 Other specified abnormal findings of blood chemistry: Secondary | ICD-10-CM | POA: Diagnosis not present

## 2023-09-05 DIAGNOSIS — G5603 Carpal tunnel syndrome, bilateral upper limbs: Secondary | ICD-10-CM | POA: Diagnosis not present

## 2023-09-05 DIAGNOSIS — E785 Hyperlipidemia, unspecified: Secondary | ICD-10-CM | POA: Diagnosis not present

## 2023-09-05 DIAGNOSIS — E1159 Type 2 diabetes mellitus with other circulatory complications: Secondary | ICD-10-CM | POA: Diagnosis not present

## 2023-09-05 DIAGNOSIS — I152 Hypertension secondary to endocrine disorders: Secondary | ICD-10-CM | POA: Diagnosis not present

## 2023-09-08 ENCOUNTER — Encounter: Payer: Self-pay | Admitting: Oncology

## 2023-09-08 ENCOUNTER — Inpatient Hospital Stay

## 2023-09-08 ENCOUNTER — Inpatient Hospital Stay: Attending: Oncology | Admitting: Oncology

## 2023-09-08 VITALS — BP 144/57 | HR 59 | Temp 97.0°F | Resp 18 | Wt 160.7 lb

## 2023-09-08 DIAGNOSIS — D75838 Other thrombocytosis: Secondary | ICD-10-CM | POA: Insufficient documentation

## 2023-09-08 DIAGNOSIS — D75839 Thrombocytosis, unspecified: Secondary | ICD-10-CM

## 2023-09-08 LAB — CBC WITH DIFFERENTIAL/PLATELET
Abs Immature Granulocytes: 0.01 10*3/uL (ref 0.00–0.07)
Basophils Absolute: 0 10*3/uL (ref 0.0–0.1)
Basophils Relative: 0 %
Eosinophils Absolute: 0.2 10*3/uL (ref 0.0–0.5)
Eosinophils Relative: 3 %
HCT: 36.6 % (ref 36.0–46.0)
Hemoglobin: 12.4 g/dL (ref 12.0–15.0)
Immature Granulocytes: 0 %
Lymphocytes Relative: 16 %
Lymphs Abs: 1.1 10*3/uL (ref 0.7–4.0)
MCH: 29.6 pg (ref 26.0–34.0)
MCHC: 33.9 g/dL (ref 30.0–36.0)
MCV: 87.4 fL (ref 80.0–100.0)
Monocytes Absolute: 0.8 10*3/uL (ref 0.1–1.0)
Monocytes Relative: 11 %
Neutro Abs: 4.6 10*3/uL (ref 1.7–7.7)
Neutrophils Relative %: 70 %
Platelets: 544 10*3/uL — ABNORMAL HIGH (ref 150–400)
RBC: 4.19 MIL/uL (ref 3.87–5.11)
RDW: 13.1 % (ref 11.5–15.5)
WBC: 6.7 10*3/uL (ref 4.0–10.5)
nRBC: 0 % (ref 0.0–0.2)

## 2023-09-08 LAB — TECHNOLOGIST SMEAR REVIEW: Plt Morphology: INCREASED

## 2023-09-08 LAB — RETIC PANEL
Immature Retic Fract: 4 % (ref 2.3–15.9)
RBC.: 4.19 MIL/uL (ref 3.87–5.11)
Retic Count, Absolute: 75 10*3/uL (ref 19.0–186.0)
Retic Ct Pct: 1.8 % (ref 0.4–3.1)
Reticulocyte Hemoglobin: 32 pg (ref >27.9)

## 2023-09-08 LAB — IRON AND TIBC
Iron: 76 ug/dL (ref 28–170)
Saturation Ratios: 22 % (ref 10.4–31.8)
TIBC: 349 ug/dL (ref 250–450)
UIBC: 273 ug/dL

## 2023-09-08 LAB — FERRITIN: Ferritin: 74 ng/mL (ref 11–307)

## 2023-09-08 NOTE — Progress Notes (Signed)
 Hematology/Oncology Consult note [phone] [phone]        REFERRING PROVIDER: Antonio Baumgarten, MD   CHIEF COMPLAINTS/REASON FOR VISIT:  Evaluation of thrombocytosis   ASSESSMENT & PLAN:   Thrombocytosis Labs are reviewed and discussed with patient. Chronic intermittent thrombocytosis.  Differential diagnosis includes  chronic inflammation, iron deficiency, bone marrow myeloproliferative disease etc. Check CBC, smear, iron, TIBC ferritin, reticulocyte count, JAK2 V6 17F mutation with reflex, BCR-ABL 1 FISH   Orders Placed This Encounter  Procedures   CBC with Differential/Platelet    Standing Status:   Future    Number of Occurrences:   1    Expected Date:   09/08/2023    Expiration Date:   09/07/2024   BCR-ABL1 FISH    Standing Status:   Future    Number of Occurrences:   1    Expected Date:   09/08/2023    Expiration Date:   09/07/2024   JAK2 V617F rfx CALR/MPL/E12-15    Standing Status:   Future    Number of Occurrences:   1    Expected Date:   09/08/2023    Expiration Date:   09/07/2024   Iron and TIBC    Standing Status:   Future    Number of Occurrences:   1    Expected Date:   09/08/2023    Expiration Date:   09/07/2024   Ferritin    Standing Status:   Future    Number of Occurrences:   1    Expected Date:   09/08/2023    Expiration Date:   03/09/2024   Retic Panel    Standing Status:   Future    Number of Occurrences:   1    Expected Date:   09/08/2023    Expiration Date:   09/07/2024   Technologist smear review    Standing Status:   Future    Number of Occurrences:   1    Expected Date:   09/08/2023    Expiration Date:   09/07/2024    Clinical information::   smear   Follow-up in a few weeks to review results. All questions were answered. The patient knows to call the clinic with any problems, questions or concerns.  Christine Forbes, MD, PhD [facility] Hematology Oncology 09/08/2023   HISTORY OF PRESENTING ILLNESS:   Christine Zimmerman is a  78 y.o.  female with PMH listed below was seen in consultation at the request of  Antonio Baumgarten, MD  for evaluation of thrombocytosis.   She has history of elevated platelet counts intermittently since at least December 2021, with the most recent count being 605 x 10^9/L in April 2025. Previous counts include 522 x 10^9/L in January 2025, 465 x 10^9/L in September 2024, and 466 x 10^9/L in April 2023.  She denies any history of iron deficiency or other deficiencies, although she is a vegetarian and takes iron supplements. Her B12 levels were checked six months ago and were normal. She is currently taking iron tablets.  She has a history of osteoarthritis, specifically affecting her knees, and experiences knee pain. She also had carpal tunnel syndrome, for which she received an injection and physical therapy. A steroid was applied during therapy, which caused swelling that has since resolved.  No family history of cancer or autoimmune diseases like rheumatoid arthritis or lupus. She is unaware of any family history of similar conditions.  No history of blood clots or abdominal surgeries. She confirms having her  sple Patient is vegetarian  MEDICAL HISTORY:  Past Medical History:  Diagnosis Date   Atrial fibrillation (HCC)    CHF (congestive heart failure) (HCC)    Chronic pain    Coronary artery disease    Diabetes mellitus without complication (HCC)    GERD (gastroesophageal reflux disease)    High cholesterol    Hypertension    Hypothyroidism    Presence of permanent cardiac pacemaker     SURGICAL HISTORY: Past Surgical History:  Procedure Laterality Date   BACK SURGERY     CARDIAC CATHETERIZATION Left 10/23/2015   Procedure: Left Heart Cath and Coronary Angiography;  Surgeon: Antonette Batters, MD;  Location: [facility] INVASIVE CV LAB;  Service: Cardiovascular;  Laterality: Left;   COLONOSCOPY N/A 04/29/2022   Procedure: COLONOSCOPY;  Surgeon: Quintin Buckle, DO;   Location: [facility] ENDOSCOPY;  Service: Gastroenterology;  Laterality: N/A;   COLONOSCOPY WITH PROPOFOL  N/A 01/03/2017   Procedure: COLONOSCOPY WITH PROPOFOL ;  Surgeon: Cassie Click, MD;  Location: [facility] ENDOSCOPY;  Service: Endoscopy;  Laterality: N/A;   COLONOSCOPY WITH PROPOFOL  N/A 04/12/2022   Procedure: COLONOSCOPY WITH PROPOFOL ;  Surgeon: Quintin Buckle, DO;  Location: [facility] ENDOSCOPY;  Service: Gastroenterology;  Laterality: N/A;   CORONARY STENT PLACEMENT     x 4 at [facility]. x 1 by [place] in 2004   INSERT / REPLACE / REMOVE PACEMAKER     LEFT HEART CATH AND CORONARY ANGIOGRAPHY N/A 06/08/2017   Procedure: LEFT HEART CATH AND CORONARY ANGIOGRAPHY;  Surgeon: Antonette Batters, MD;  Location: [facility] INVASIVE CV LAB;  Service: Cardiovascular;  Laterality: N/A;   PACEMAKER IMPLANT N/A 11/03/2021   Procedure: PACEMAKER IMPLANT;  Surgeon: Percival Brace, MD;  Location: [facility] INVASIVE CV LAB;  Service: Cardiovascular;  Laterality: N/A;    SOCIAL HISTORY: Social History   Socioeconomic History   Marital status: Married    Spouse name: Not on file   Number of children: Not on file   Years of education: Not on file   Highest education level: Not on file  Occupational History   Not on file  Tobacco Use   Smoking status: Never   Smokeless tobacco: Never  Vaping Use   Vaping status: Never Used  Substance and Sexual Activity   Alcohol use: No   Drug use: No   Sexual activity: Not on file  Other Topics Concern   Not on file  Social History Narrative   Not on file   Social Drivers of Health   Financial Resource Strain: Low Risk  (09/05/2023)   Received from [facility]   Overall Financial Resource Strain (CARDIA)    Difficulty of Paying Living Expenses: Not hard at all  Food Insecurity: No Food Insecurity (09/05/2023)   Received from [facility]   Hunger Vital Sign    Worried About Running Out of Food in the Last Year: Never true     Ran Out of Food in the Last Year: Never true  Transportation Needs: No Transportation Needs (09/05/2023)   Received from [facility]    In the past 12 months, has lack of transportation kept you from medical appointments or from getting medications?: No    Lack of Transportation (Non-Medical): No  Physical Activity: Not on file  Stress: Not on file  Social Connections: Not on file  Intimate Partner Violence: Not on file    FAMILY HISTORY: Family History  Problem Relation Age of  Onset   Breast cancer Neg Hx     ALLERGIES:  is allergic to biaxin [clarithromycin], farxiga [dapagliflozin], homatropine, januvia [sitagliptin], macrodantin [nitrofurantoin macrocrystal], naproxen, propoxyphene, tramadol, and penicillins.  MEDICATIONS:  Current Outpatient Medications  Medication Sig Dispense Refill   carvedilol  (COREG ) 6.25 MG tablet Take 1 tablet (6.25 mg total) by mouth 2 (two) times daily with a meal. 60 tablet 1   clopidogrel  (PLAVIX ) 75 MG tablet Take 75 mg by mouth daily.     diclofenac sodium (VOLTAREN) 1 % GEL Apply 2 g topically 4 (four) times daily as needed.     DULoxetine  (CYMBALTA ) 20 MG capsule Take 20 mg by mouth daily.     ferrous sulfate  325 (65 FE) MG tablet Take 1 tablet by mouth 2 (two) times daily with a meal.      furosemide  (LASIX ) 20 MG tablet Take 20 mg by mouth daily as needed for edema.     glipiZIDE  (GLUCOTROL  XL) 5 MG 24 hr tablet Take 1 tablet (5 mg total) by mouth 2 (two) times daily. 30 tablet 0   isosorbide  mononitrate (IMDUR ) 30 MG 24 hr tablet Take 30 mg by mouth daily.      levothyroxine  (SYNTHROID ) 88 MCG tablet Take 88 mcg by mouth daily before breakfast.     meloxicam  (MOBIC ) 7.5 MG tablet Take 7.5 mg by mouth daily.     metFORMIN  (GLUCOPHAGE ) 1000 MG tablet Take 1,000 mg by mouth daily with breakfast.      niacin  500 MG tablet Take 500 mg by mouth 2 (two) times daily with a meal.     nitroGLYCERIN   (NITROLINGUAL ) 0.4 MG/SPRAY spray Place 1 spray under the tongue every 5 (five) minutes x 3 doses as needed for chest pain.     pantoprazole  (PROTONIX ) 40 MG tablet Take 40 mg by mouth every evening.     potassium chloride  (K-DUR,KLOR-CON ) 10 MEQ tablet Take 10 mEq by mouth daily as needed (with furosemide ).     pregabalin  (LYRICA ) 100 MG capsule Take 100 mg by mouth 3 (three) times daily.     No current facility-administered medications for this visit.    Review of Systems  Constitutional:  Positive for fatigue. Negative for appetite change, chills and fever.  HENT:   Negative for hearing loss and voice change.   Eyes:  Negative for eye problems.  Respiratory:  Negative for chest tightness and cough.   Cardiovascular:  Negative for chest pain.  Gastrointestinal:  Negative for abdominal distention, abdominal pain and blood in stool.  Endocrine: Negative for hot flashes.  Genitourinary:  Negative for difficulty urinating and frequency.   Musculoskeletal:  Negative for arthralgias.  Skin:  Negative for itching and rash.  Neurological:  Negative for extremity weakness.  Hematological:  Negative for adenopathy.  Psychiatric/Behavioral:  Negative for confusion.    PHYSICAL EXAMINATION: Vitals:   09/08/23 1448  BP: (!) 144/57  Pulse: (!) 59  Resp: 18  Temp: (!) 97 F (36.1 C)  SpO2: 99%   Filed Weights   09/08/23 1448  Weight: 160 lb 11.2 oz (72.9 kg)    Physical Exam Constitutional:      General: She is not in acute distress.    Appearance: She is obese.     Comments: Patient sits in a wheelchair.  HENT:     Head: Normocephalic and atraumatic.  Eyes:     General: No scleral icterus. Cardiovascular:     Rate and Rhythm: Normal rate and regular rhythm.  Pulmonary:  Effort: Pulmonary effort is normal. No respiratory distress.     Breath sounds: No wheezing.  Abdominal:     General: Bowel sounds are normal. There is no distension.     Palpations: Abdomen is soft.   Musculoskeletal:        General: No deformity. Normal range of motion.     Cervical back: Normal range of motion and neck supple.  Skin:    General: Skin is warm and dry.     Findings: No erythema or rash.  Neurological:     Mental Status: She is alert and oriented to person, place, and time. Mental status is at baseline.  Psychiatric:        Mood and Affect: Mood normal.     LABORATORY DATA:  I have reviewed the data as listed    Latest Ref Rng & Units 09/08/2023    3:41 PM 10/14/2021   11:21 AM 09/28/2021    7:13 PM  CBC  WBC 4.0 - 10.5 K/uL 6.7  4.8  5.8   Hemoglobin 12.0 - 15.0 g/dL 11.9  14.7  82.9   Hematocrit 36.0 - 46.0 % 36.6  36.6  38.5   Platelets 150 - 400 K/uL 544  425  479       Latest Ref Rng & Units 10/14/2021   11:21 AM 09/28/2021    7:13 PM 11/10/2020   11:32 AM  CMP  Glucose 70 - 99 mg/dL 562  130  865   BUN 8 - 23 mg/dL 7  7  13    Creatinine 0.44 - 1.00 mg/dL 7.84  6.96  2.95   Sodium 135 - 145 mmol/L 121  127  122   Potassium 3.5 - 5.1 mmol/L 4.2  3.5  3.7   Chloride 98 - 111 mmol/L 90  89  89   CO2 22 - 32 mmol/L 25  29  25    Calcium  8.9 - 10.3 mg/dL 8.6  9.1  8.5   Total Protein 6.5 - 8.1 g/dL  6.6  6.6   Total Bilirubin 0.3 - 1.2 mg/dL  0.9  1.1   Alkaline Phos 38 - 126 U/L  101  89   AST 15 - 41 U/L  13  14   ALT 0 - 44 U/L  16  21       RADIOGRAPHIC STUDIES: I have personally reviewed the radiological images as listed and agreed with the findings in the report. No results found.

## 2023-09-08 NOTE — Assessment & Plan Note (Signed)
 Labs are reviewed and discussed with patient. Chronic intermittent thrombocytosis.  Differential diagnosis includes  chronic inflammation, iron deficiency, bone marrow myeloproliferative disease etc. Check CBC, smear, iron, TIBC ferritin, reticulocyte count, JAK2 V6 71F mutation with reflex, BCR-ABL 1 FISH

## 2023-09-13 LAB — BCR-ABL1 FISH
Cells Analyzed: 200
Cells Counted: 200

## 2023-09-18 LAB — JAK2 V617F RFX CALR/MPL/E12-15

## 2023-09-18 LAB — CALR +MPL + E12-E15  (REFLEX)

## 2023-09-19 ENCOUNTER — Ambulatory Visit: Attending: Internal Medicine | Admitting: Physical Therapy

## 2023-09-19 DIAGNOSIS — R262 Difficulty in walking, not elsewhere classified: Secondary | ICD-10-CM | POA: Diagnosis not present

## 2023-09-19 DIAGNOSIS — M6281 Muscle weakness (generalized): Secondary | ICD-10-CM | POA: Diagnosis not present

## 2023-09-19 DIAGNOSIS — R2689 Other abnormalities of gait and mobility: Secondary | ICD-10-CM | POA: Insufficient documentation

## 2023-09-19 NOTE — Therapy (Addendum)
 OUTPATIENT PHYSICAL THERAPY LOWER EXTREMITY TREATMENT   Patient Name: Christine Zimmerman MRN: [id number] DOB:05-29-1945, 78 y.o., female Today's Date: 09/19/2023  END OF SESSION:  PT End of Session - 09/19/23 0824     Visit Number 16    Number of Visits 18    Date for PT Re-Evaluation 10/18/23    Authorization Type [employer] 2025    Authorization Time Period auth 18 visits  06/28/23-10/21/23 auth# [id number]    Authorization - Visit Number 16    Authorization - Number of Visits 18    Progress Note Due on Visit 18    PT Start Time 0820    PT Stop Time 0900    PT Time Calculation (min) 40 min    Equipment Utilized During Treatment Gait belt    Activity Tolerance Patient tolerated treatment well    Behavior During Therapy WFL for tasks assessed/performed                         Past Medical History:  Diagnosis Date   Atrial fibrillation (HCC)    CHF (congestive heart failure) (HCC)    Chronic pain    Coronary artery disease    Diabetes mellitus without complication (HCC)    GERD (gastroesophageal reflux disease)    High cholesterol    Hypertension    Hypothyroidism    Presence of permanent cardiac pacemaker    Past Surgical History:  Procedure Laterality Date   BACK SURGERY     CARDIAC CATHETERIZATION Left 10/23/2015   Procedure: Left Heart Cath and Coronary Angiography;  Surgeon: Antonette Batters, MD;  Location: [facility] INVASIVE CV LAB;  Service: Cardiovascular;  Laterality: Left;   COLONOSCOPY N/A 04/29/2022   Procedure: COLONOSCOPY;  Surgeon: Quintin Buckle, DO;  Location[facility] ENDOSCOPY;  Service: Gastroenterology;  Laterality: N/A;   COLONOSCOPY WITH PROPOFOL  N/A 01/03/2017   Procedure: COLONOSCOPY WITH PROPOFOL ;  Surgeon: Cassie Click, MD;  Location: [facility] ENDOSCOPY;  Service: Endoscopy;  Laterality: N/A;   COLONOSCOPY WITH PROPOFOL  N/A 04/12/2022   Procedure: COLONOSCOPY WITH PROPOFOL ;  Surgeon: Quintin Buckle, DO;  Location: [facility] ENDOSCOPY;   Service: Gastroenterology;  Laterality: N/A;   CORONARY STENT PLACEMENT     x 4 at [facility]. x 1 by [place] in 2004   INSERT / REPLACE / REMOVE PACEMAKER     LEFT HEART CATH AND CORONARY ANGIOGRAPHY N/A 06/08/2017   Procedure: LEFT HEART CATH AND CORONARY ANGIOGRAPHY;  Surgeon: Antonette Batters, MD;  Location: [facility] INVASIVE CV LAB;  Service: Cardiovascular;  Laterality: N/A;   PACEMAKER IMPLANT N/A 11/03/2021   Procedure: PACEMAKER IMPLANT;  Surgeon: Percival Brace, MD;  Location: [facility] INVASIVE CV LAB;  Service: Cardiovascular;  Laterality: N/A;   Patient Active Problem List   Diagnosis Date Noted   Thrombocytosis 09/08/2023   Sick sinus syndrome (HCC) 11/03/2021   Weakness generalized 05/09/2019   Chronic diastolic heart failure (HCC) 03/23/2018   HTN (hypertension) 03/23/2018   DM (diabetes mellitus) (HCC) 03/23/2018   SOB (shortness of breath) 02/01/2018   Dizziness 08/01/2017   TIA (transient ischemic attack) 10/17/2015   Hyponatremia 10/17/2015   Anemia 10/17/2015   Chest pain 10/16/2015   Left-sided weakness 10/16/2015   Hypotension 10/16/2015   Bradycardia 10/16/2015   Hypoglycemia 10/16/2015    PCP: Dr. Antonio Baumgarten   REFERRING PROVIDER: Dr. Antonio Baumgarten   REFERRING DIAG:  M25.569 (ICD-10-CM) - Pain in joint, lower leg  M25.561 (ICD-10-CM) - Pain in right  knee  M25.562 (ICD-10-CM) - Pain in left knee  M79.609 (ICD-10-CM) - Pain in limb  M79.604 (ICD-10-CM) - Pain in right leg  M79.605 (ICD-10-CM) - Pain in left leg    THERAPY DIAG:  Difficulty in walking, not elsewhere classified  Imbalance  Rationale for Evaluation and Treatment: Rehabilitation  ONSET DATE: 04/2019  SUBJECTIVE:   SUBJECTIVE STATEMENT: Pt reporting back after long hiatus from treatment and she had a number of other medical  complications including screening for cancer with thrombocytopenia.  She is seeing oncologist and she describes feeling increased fatigue. She wants  to defer on testing during the session due to increase knee pain. She reports having a near fall where she hit her left hip because one of her legs gave out from pain.   PERTINENT HISTORY: Pt reports ongoing knee pain that has been making it difficult for her to move and she has been steadily becoming less mobile. She used to go to water aerobics and that is where she got most of her exercise, but she is afraid of falling and this keeps her from going to the class at the [facility]. Pt is a diabetic and she has diabetic neuropathy in her feet. She now has to use a single point cane to avoid from falling.  PAIN:  Are you having pain? Yes: NPRS scale: 2-3/10 Pain location: Anterior surface of left knee  Pain description: Achy  Aggravating factors: Walking and standing or basically any weight bearing, stairs  Relieving factors: Vicks helps her go to sleep.   PRECAUTIONS: None  RED FLAGS: None   WEIGHT BEARING RESTRICTIONS: No  FALLS:  Has patient fallen in last 6 months? No  LIVING ENVIRONMENT: Lives with: lives with their spouse Lives in: House/apartment Stairs: Yes: External: 5 steps; on right going up Has following equipment at home: Single point cane and Walker - 2 wheeled  OCCUPATION: Retired   PLOF: Independent  PATIENT GOALS: To be able to walk without an assistive device and to get around her home and upstairs without help   NEXT MD VISIT: January 2025   OBJECTIVE:  Note: Objective measures were completed at Evaluation unless otherwise noted.  VITALS: 197/78 in sitting                 195/78 in sitting   DIAGNOSTIC FINDINGS:   AP and lateral x-rays of the lumbar spine were ordered and personally  reviewed today. These show extensive degenerative disc disease with  extensive osteophyte formation, mild SI arthritis on the AP view, no  scoliosis, pedicles intact. Lateral view shows severe degenerative changes  to the L4-5 disc space with extensive facet arthritis. No   spondylolisthesis, no fracture.  X-ray impression: Extensive degenerative disc and facet arthritis, lumbar  spine.   PATIENT SURVEYS:  FOTO 40 with target of    COGNITION: Overall cognitive status: Within functional limits for tasks assessed     SENSATION: Not tested  MUSCLE LENGTH:  Not performed   POSTURE: rounded shoulders  PALPATION: None performed    LOWER EXTREMITY ROM:  WFL   LOWER EXTREMITY MMT:   (Blank rows = not tested)  LOWER EXTREMITY SPECIAL TESTS: None performed   FUNCTIONAL TESTS:  5 times sit to stand: 0 reps 30 seconds chair stand test: 0 reps  Timed up and go (TUG): Not tested  2 minute walk test: 170 ft with use of single point cane  10 meter walk test: NT  Dynamic Gait Index: NT  MCTSIB:  Condition 1: Avg of 3 trials: NT sec, Condition 2: Avg of 3 trials: NT sec, Condition 3: Avg of 3 trials: NT sec, Condition 4: Avg of 3 trials: NT sec, and Total Score: NT   GAIT: Distance walked: 50 ft  Assistive device utilized: Single point cane Level of assistance: Modified independence Comments: Right sided antalgic gait                                                                                                                                 TREATMENT DATE:   09/19/23 THEREX  Nu-Step for 6 min with seat and arms at 5 for 2 min  Sit to Stand with 20 inch 3 x 8   -min VC to increase stand and decrease speed of eccentric speed.  -min VC to increase distance of feet spacing to increase base of support  10 M x 10 with rollator   -RPE 5/10  Heel Raises with BUE support 1 x 10  Heel Raises with BUE support with #5 AW 2 x 10  -Pt unable to achieve extended knee on right due to increased pain Standing Marches with BUE support with #5 AW 1 x 10  Seated with Marches with RLE #5 AW 1 x 10  -Unable to raise RLE all the way  Seated with Marches with RLE AROM 1 x 10   08/11/23: THEREX  Nu-Step seat and arms at 7 for 6 min with resistance at 3  Sit to  Stand from 18 inch mat height 1 x 5  Standing HS curls #3 AW 2 x 10  -Pt reports increased knee pain    PHYSICAL PERFORMANCE   5 xSTS: 3 reps 30 sec chair stands: 3 reps      08/09/23 0001  Dynamic Gait Index  Level Surface 3  Change in Gait Speed 2  Gait with Horizontal Head Turns 3  Gait with Vertical Head Turns 3  Gait and Pivot Turn 3  Step Over Obstacle 0  Step Around Obstacles 3  Steps 1  Total Score 18   TUG (without AD): 16 sec    08/09/23:  THEREX Nu-Step seat and arms at 7 for 5 min with resistance at 3   Mini Squat with BUE support using rollator 1 x 10  -min to maintain knees behind toes  Standing Hip Abduction with BUE support using rollator  Standing HS curl with #3 AW with BUE support using rollator  2 x 10  Standing Marches with BUE support 2 x 10   THERAPEUTIC ACTIVITY  585 ft overground ambulation outside with rollator  -min VC to maintain close proximity to walker  200 ft overground ambulation inside gym with rollator  200 ft overground ambulation inside gym with #3 AW with rollator    SELF CARE HOME MANAGEMENT  Discussion about initiating walking program and to start with 10 minutes per day for 3 x per week  PATIENT EDUCATION:  Education details: Educated pt on use of pain relief drugs to avoid limitations of pain and that she should speak to her physician if she is concerned about what pain medications to take.   Person educated: Patient and Spouse Education method: Explanation, Demonstration, Verbal cues, and Handouts Education comprehension: verbalized understanding, returned demonstration, and verbal cues required  HOME EXERCISE PROGRAM: Access Code: [email or web address] URL: [email or web address] Date: 09/19/2023 Prepared by: Marge Shed  Exercises - Walking  - 1 x daily - Long Sitting Calf Stretch with Strap  - 1 x daily - 3 reps - 60 sec sec  hold - Seated Plantar Fascia Mobilization with Small Ball  - 1 x daily - 7 x  weekly - 10 reps - Sit to Stand  - 3-4 x weekly - 3 sets - 5 reps - Heel Raises with Counter Support  - 3-4 x weekly - 3 sets - 10 reps - Seated March  - 3-4 x weekly - 3 sets - 10 reps  ASSESSMENT:  CLINICAL IMPRESSION: Despite length of time between last session and need for reassessment of goals, deferred on looking at goals due to pt's limitations from knee pain. She continues to be limited by bilateral knee pain. PT recommending that she seek further medical evaluation and treatment for bilateral knee pain given how severe her knee OA is and how limiting it has been during sessions up until this point. She will continue to benefit from skilled PT to improve LE strength and aerobic endurance to improve mobility and decrease risk of falling while performing activities of daily living like grocery shopping or negotiating stairs.   OBJECTIVE IMPAIRMENTS: Abnormal gait, decreased balance, decreased endurance, difficulty walking, decreased strength, and pain.   ACTIVITY LIMITATIONS: carrying, lifting, bending, standing, squatting, stairs, transfers, and locomotion level  PARTICIPATION LIMITATIONS: meal prep, cleaning, laundry, driving, shopping, community activity, and church  PERSONAL FACTORS: Age, Past/current experiences, Time since onset of injury/illness/exacerbation, and 3+ comorbidities: HTN, T2DM, Afib  are also affecting patient's functional outcome.   REHAB POTENTIAL: Good  CLINICAL DECISION MAKING: Stable/uncomplicated  EVALUATION COMPLEXITY: Low   GOALS: Goals reviewed with patient? No  SHORT TERM GOALS: Target date: 05/23/2023  PT reviewed the following HEP with patient with patient able to demonstrate a set of the following with min cuing for correction needed. PT educated patient on parameters of therex (how/when to inc/decrease intensity, frequency, rep/set range, stretch hold time, and purpose of therex) with verbalized understanding.  Baseline:NT  05/24/23: Performing  independently   Goal status: ACHIEVED   2.  Patient will be able to perform a sit to stand without use of upper extremity support from 18 inch seated surface as evidence of improved LE strength.  Baseline: Needs to use hands 06/28/23: Needs to use hands still or push against knees 07/20/23: Needs to use one hand to push up 08/11/23: 3 reps  Goal status: ACHIEVED   3. Patient will demonstrate an improvement in DGI score of >=2 pts as evidence of improved dynamic balance to decrease risk of falling (Pardasaney et al, 2012).            Baseline: 11/24 06/30/23: 15/24 07/20/23: 14/24 Goal status: ACHIEVED     LONG TERM GOALS: Target date: 07/18/2023  Patient will have improved function and activity level as evidenced by an increase in FOTO score by 8 points or more.  Baseline: 40 with target of 48 06/28/23: 39 with target of 48  Goal status: ONGOING   2.  Patient will perform five sit to stand repetitions in <=15 secs as evidence of LE strength that shows that this community dwelling older adult that is older is at a decreased risk of falling. (Buatois 2010)   Baseline: 0 reps; needs to use hands 06/28/23: 0 reps; needs to use hands 07/20/23: 0 reps 08/09/23: 3 reps  Goal status: ONGOING   3.  Patient will perform >=11 reps for 30 sec chair stands as evidence of improved LE endurance that decreases her risk of falling.  Baseline: 0 reps 06/28/23: 0 reps; needs to use hands 08/11/23: 3 reps  Goal status: ONGOING   4.  Patient will improve distance by >=40 ft for improved aerobic endurance and increased mobility to resume yard work outside [employer], 2009).  Baseline: 175 ft 06/28/23: 229 ft 07/20/23: 250 ft  Goal status: ACHIEVED   5.  Patient will be able to negotiate >=5 stairs without modified independence as evidence of improved mobility and to decrease caregiver burden.  Baseline: Pt is safest using SPC to negotiate stairs  07/20/23: Able to negotiate 5 stairs with step to gait pattern with  RLE going upwards and LLE going downwards and use of SPC  Goal status: Deferred   6. Patient will perform TUG in <13.5 sec as evidence of improved mobility and decreased risk for falling [facility] et al., 2000) Baseline: 20 sec 06/30/23: 17.57 sec 07/20/23: 22 sec 08/11/23: 16 sec  Goal status: PARTIALLY MET             7. Patient will demonstrate reduced falls risk as evidenced by Dynamic Gait Index (DGI) >19/24.  Baseline: 11/24 07/20/23: 14/24 08/11/23: 18/24 Goal status: ONGOING    PLAN:  PT FREQUENCY: 1-2x/week  PT DURATION: 10 weeks  PLANNED INTERVENTIONS: 97164- PT Re-evaluation, 97110-Therapeutic exercises, 97530- Therapeutic activity, 97112- Neuromuscular re-education, 97535- Self Care, 16109- Manual therapy, 97116- Gait training, [phone]- Orthotic Fit/training, 97014- Electrical stimulation (unattended), [phone]- Electrical stimulation (manual), Patient/Family education, Balance training, Stair training, Dry Needling, Joint mobilization, Joint manipulation, Spinal manipulation, Spinal mobilization, Vestibular training, DME instructions, Cryotherapy, and Moist heat  PLAN FOR NEXT SESSION: Reassess goals especially FOTO. TM warm up. Hip ER Stretch. Leg Press, Sit to [employer], and Standing HS curls.   Marge Shed PT, DPT  [facility] [address] [place], [address] Phone: [phone]   Fax:  [phone]

## 2023-09-21 ENCOUNTER — Ambulatory Visit: Admitting: Physical Therapy

## 2023-09-27 ENCOUNTER — Ambulatory Visit: Admitting: Physical Therapy

## 2023-09-27 DIAGNOSIS — R262 Difficulty in walking, not elsewhere classified: Secondary | ICD-10-CM | POA: Diagnosis not present

## 2023-09-27 DIAGNOSIS — R2689 Other abnormalities of gait and mobility: Secondary | ICD-10-CM

## 2023-09-27 DIAGNOSIS — M6281 Muscle weakness (generalized): Secondary | ICD-10-CM | POA: Diagnosis not present

## 2023-09-27 NOTE — Therapy (Signed)
 OUTPATIENT PHYSICAL THERAPY LOWER EXTREMITY TREATMENT   Patient Name: Christine Zimmerman MRN: [id number] DOB:[birth year], 78 y.o., female Today's Date: 09/27/2023  END OF SESSION:  PT End of Session - 09/27/23 1040     Visit Number 17    Number of Visits 18    Date for PT Re-Evaluation 10/18/23    Authorization Type [employer] 2025    Authorization Time Period auth 18 visits  06/28/23-10/21/23 auth# [id number]    Authorization - Visit Number 17    Authorization - Number of Visits 18    Progress Note Due on Visit 18    PT Start Time 1035    PT Stop Time 1115    PT Time Calculation (min) 40 min    Equipment Utilized During Treatment Gait belt    Activity Tolerance Patient tolerated treatment well    Behavior During Therapy WFL for tasks assessed/performed                          Past Medical History:  Diagnosis Date   Atrial fibrillation (HCC)    CHF (congestive heart failure) (HCC)    Chronic pain    Coronary artery disease    Diabetes mellitus without complication (HCC)    GERD (gastroesophageal reflux disease)    High cholesterol    Hypertension    Hypothyroidism    Presence of permanent cardiac pacemaker    Past Surgical History:  Procedure Laterality Date   BACK SURGERY     CARDIAC CATHETERIZATION Left 10/23/2015   Procedure: Left Heart Cath and Coronary Angiography;  Surgeon: Antonette Batters, MD;  Location: [facility] INVASIVE CV LAB;  Service: Cardiovascular;  Laterality: Left;   COLONOSCOPY N/A 04/29/2022   Procedure: COLONOSCOPY;  Surgeon: Quintin Buckle, DO;  Location: [facility] ENDOSCOPY;  Service: Gastroenterology;  Laterality: N/A;   COLONOSCOPY WITH PROPOFOL  N/A 01/03/2017   Procedure: COLONOSCOPY WITH PROPOFOL ;  Surgeon: Cassie Click, MD;  Location: [facility] ENDOSCOPY;  Service: Endoscopy;  Laterality: N/A;   COLONOSCOPY WITH PROPOFOL  N/A 04/12/2022   Procedure: COLONOSCOPY WITH PROPOFOL ;  Surgeon: Quintin Buckle, DO;  Location: [facility]  ENDOSCOPY;  Service: Gastroenterology;  Laterality: N/A;   CORONARY STENT PLACEMENT     x 4 at [facility]. x 1 by [place] in 2004   INSERT / REPLACE / REMOVE PACEMAKER     LEFT HEART CATH AND CORONARY ANGIOGRAPHY N/A 06/08/2017   Procedure: LEFT HEART CATH AND CORONARY ANGIOGRAPHY;  Surgeon: Antonette Batters, MD;  Location: [facility] INVASIVE CV LAB;  Service: Cardiovascular;  Laterality: N/A;   PACEMAKER IMPLANT N/A 11/03/2021   Procedure: PACEMAKER IMPLANT;  Surgeon: Percival Brace, MD;  Location: [facility] INVASIVE CV LAB;  Service: Cardiovascular;  Laterality: N/A;   Patient Active Problem List   Diagnosis Date Noted   Thrombocytosis 09/08/2023   Sick sinus syndrome (HCC) 11/03/2021   Weakness generalized 05/09/2019   Chronic diastolic heart failure (HCC) 03/23/2018   HTN (hypertension) 03/23/2018   DM (diabetes mellitus) (HCC) 03/23/2018   SOB (shortness of breath) 02/01/2018   Dizziness 08/01/2017   TIA (transient ischemic attack) 10/17/2015   Hyponatremia 10/17/2015   Anemia 10/17/2015   Chest pain 10/16/2015   Left-sided weakness 10/16/2015   Hypotension 10/16/2015   Bradycardia 10/16/2015   Hypoglycemia 10/16/2015    PCP: Dr. Antonio Baumgarten   REFERRING PROVIDER: Dr. Antonio Baumgarten   REFERRING DIAG:  M25.569 (ICD-10-CM) - Pain in joint, lower leg  M25.561 (ICD-10-CM) - Pain in  right knee  M25.562 (ICD-10-CM) - Pain in left knee  M79.609 (ICD-10-CM) - Pain in limb  M79.604 (ICD-10-CM) - Pain in right leg  M79.605 (ICD-10-CM) - Pain in left leg    THERAPY DIAG:  No diagnosis found.  Rationale for Evaluation and Treatment: Rehabilitation  ONSET DATE: 04/2019  SUBJECTIVE:   SUBJECTIVE STATEMENT: Pt reports that she is feeling increased fatigue as of late where she wants to sleep more.  She also notes that she feels as though she is going to fall backwards especially when going upstairs or when stepping over objects. She has an upcoming appointment with  oncology this week in which they will review her labs.   PERTINENT HISTORY: Pt reports ongoing knee pain that has been making it difficult for her to move and she has been steadily becoming less mobile. She used to go to water aerobics and that is where she got most of her exercise, but she is afraid of falling and this keeps her from going to the class at the [facility]. Pt is a diabetic and she has diabetic neuropathy in her feet. She now has to use a single point cane to avoid from falling.  PAIN:  Are you having pain? Yes: NPRS scale: 2-3/10 Pain location: Anterior surface of left knee  Pain description: Achy  Aggravating factors: Walking and standing or basically any weight bearing, stairs  Relieving factors: Vicks helps her go to sleep.   PRECAUTIONS: None  RED FLAGS: None   WEIGHT BEARING RESTRICTIONS: No  FALLS:  Has patient fallen in last 6 months? No  LIVING ENVIRONMENT: Lives with: lives with their spouse Lives in: House/apartment Stairs: Yes: External: 5 steps; on right going up Has following equipment at home: Single point cane and Walker - 2 wheeled  OCCUPATION: Retired   PLOF: Independent  PATIENT GOALS: To be able to walk without an assistive device and to get around her home and upstairs without help   NEXT MD VISIT: January 2025   OBJECTIVE:  Note: Objective measures were completed at Evaluation unless otherwise noted.  VITALS: 197/78 in sitting                 195/78 in sitting   DIAGNOSTIC FINDINGS:   AP and lateral x-rays of the lumbar spine were ordered and personally  reviewed today. These show extensive degenerative disc disease with  extensive osteophyte formation, mild SI arthritis on the AP view, no  scoliosis, pedicles intact. Lateral view shows severe degenerative changes  to the L4-5 disc space with extensive facet arthritis. No  spondylolisthesis, no fracture.  X-ray impression: Extensive degenerative disc and facet arthritis, lumbar  spine.    PATIENT SURVEYS:  FOTO 40 with target of    COGNITION: Overall cognitive status: Within functional limits for tasks assessed     SENSATION: Not tested  MUSCLE LENGTH:  Not performed   POSTURE: rounded shoulders  PALPATION: None performed    LOWER EXTREMITY ROM:  WFL   LOWER EXTREMITY MMT:   (Blank rows = not tested)  LOWER EXTREMITY SPECIAL TESTS: None performed   FUNCTIONAL TESTS:  5 times sit to stand: 0 reps 30 seconds chair stand test: 0 reps  Timed up and go (TUG): Not tested  2 minute walk test: 170 ft with use of single point cane  10 meter walk test: NT  Dynamic Gait Index: NT  MCTSIB: Condition 1: Avg of 3 trials: NT sec, Condition 2: Avg of 3 trials: NT sec, Condition  3: Avg of 3 trials: NT sec, Condition 4: Avg of 3 trials: NT sec, and Total Score: NT   GAIT: Distance walked: 50 ft  Assistive device utilized: Single point cane Level of assistance: Modified independence Comments: Right sided antalgic gait                                                                                                                                 TREATMENT DATE:   09/27/23: THEREX  Nu-Step for 6 min with seat and arms at 5 for 2 min  Heels to toes with BUE support 2 x 10   Heel to toe raises 1 UE support 2 x 10  Stair Negotiation 6 steps ascending and descending stairs with 1 UE support  and step to pattern  -Pt able to perform with minimal verbal cues and with correct LE ascending and descending stairs.   GAIT TRAINING  Step through pattern with single point cane on RUE 10 meters x 20  -mod VC to keep the cane before stepping with LLE    NMR 10 meter step over 2x- #3 Ankle weight with use of single point cane  x  10   -mod VC to maintain single point cane in RUE   Good Mornings 2 x 10  Good Monrings with #8 DB 2 x 10   -Loss of balance with anterior lean      09/19/23 THEREX  Nu-Step for 6 min with seat and arms at 5 for 2 min  Sit to Stand with 20 inch 3  x 8   -min VC to increase stand and decrease speed of eccentric speed.  -min VC to increase distance of feet spacing to increase base of support  10 M x 10 with rollator   -RPE 5/10  Heel Raises with BUE support 1 x 10  Heel Raises with BUE support with #5 AW 2 x 10  -Pt unable to achieve extended knee on right due to increased pain Standing Marches with BUE support with #5 AW 1 x 10  Seated with Marches with RLE #5 AW 1 x 10  -Unable to raise RLE all the way  Seated with Marches with RLE AROM 1 x 10   08/11/23: THEREX  Nu-Step seat and arms at 7 for 6 min with resistance at 3  Sit to Stand from 18 inch mat height 1 x 5  Standing HS curls #3 AW 2 x 10  -Pt reports increased knee pain    PHYSICAL PERFORMANCE   5 xSTS: 3 reps 30 sec chair stands: 3 reps      08/09/23 0001  Dynamic Gait Index  Level Surface 3  Change in Gait Speed 2  Gait with Horizontal Head Turns 3  Gait with Vertical Head Turns 3  Gait and Pivot Turn 3  Step Over Obstacle 0  Step Around Obstacles 3  Steps 1  Total Score 18  TUG (without AD): 16 sec    08/09/23:  THEREX Nu-Step seat and arms at 7 for 5 min with resistance at 3   Mini Squat with BUE support using rollator 1 x 10  -min to maintain knees behind toes  Standing Hip Abduction with BUE support using rollator  Standing HS curl with #3 AW with BUE support using rollator  2 x 10  Standing Marches with BUE support 2 x 10   THERAPEUTIC ACTIVITY  585 ft overground ambulation outside with rollator  -min VC to maintain close proximity to walker  200 ft overground ambulation inside gym with rollator  200 ft overground ambulation inside gym with #3 AW with rollator    SELF CARE HOME MANAGEMENT  Discussion about initiating walking program and to start with 10 minutes per day for 3 x per week     PATIENT EDUCATION:  Education details: Educated pt on use of pain relief drugs to avoid limitations of pain and that she should speak to her  physician if she is concerned about what pain medications to take.   Person educated: Patient and Spouse Education method: Explanation, Demonstration, Verbal cues, and Handouts Education comprehension: verbalized understanding, returned demonstration, and verbal cues required  HOME EXERCISE PROGRAM: Access Code: [email or web address] URL: [email or web address] Date: 09/27/2023 Prepared by: Marge Shed  Exercises - Walking  - 1 x daily - Long Sitting Calf Stretch with Strap  - 1 x daily - 3 reps - 60 sec sec  hold - Seated Plantar Fascia Mobilization with Small Ball  - 1 x daily - 7 x weekly - 10 reps - Sit to Stand  - 3-4 x weekly - 3 sets - 5 reps - Good Morning with Resistance  - 3-4 x weekly - 3 sets - 10 reps - Heel Toe Raises with Counter Support  - 3-4 x weekly - 3 sets - 10 reps   ASSESSMENT:  CLINICAL IMPRESSION: Pt demonstrates ongoing difficulty with stepping over obstacles and walking correctly with single point cane. She was able to ambulate correctly with the single point cane after mod VC and repeated practice to maintain single point cane in RUE and to use it to support LLE, which is limited due to ongoing pain from osteoarthritis. She did have two loss of balance episodes with lumbar extension. She will continue to benefit from skilled PT to improve LE strength and aerobic endurance to improve mobility and decrease risk of falling while performing activities of daily living like grocery shopping or negotiating stairs.     OBJECTIVE IMPAIRMENTS: Abnormal gait, decreased balance, decreased endurance, difficulty walking, decreased strength, and pain.   ACTIVITY LIMITATIONS: carrying, lifting, bending, standing, squatting, stairs, transfers, and locomotion level  PARTICIPATION LIMITATIONS: meal prep, cleaning, laundry, driving, shopping, community activity, and church  PERSONAL FACTORS: Age, Past/current experiences, Time since onset of injury/illness/exacerbation,  and 3+ comorbidities: HTN, T2DM, Afib are also affecting patient's functional outcome.   REHAB POTENTIAL: Good  CLINICAL DECISION MAKING: Stable/uncomplicated  EVALUATION COMPLEXITY: Low   GOALS: Goals reviewed with patient? No  SHORT TERM GOALS: Target date: 05/23/2023  PT reviewed the following HEP with patient with patient able to demonstrate a set of the following with min cuing for correction needed. PT educated patient on parameters of therex (how/when to inc/decrease intensity, frequency, rep/set range, stretch hold time, and purpose of therex) with verbalized understanding.  Baseline:NT  05/24/23: Performing independently   Goal status: ACHIEVED   2.  Patient will be able to perform a  sit to stand without use of upper extremity support from 18 inch seated surface as evidence of improved LE strength.  Baseline: Needs to use hands 06/28/23: Needs to use hands still or push against knees 07/20/23: Needs to use one hand to push up 08/11/23: 3 reps  Goal status: ACHIEVED   3. Patient will demonstrate an improvement in DGI score of >=2 pts as evidence of improved dynamic balance to decrease risk of falling (Pardasaney et al, 2012).            Baseline: 11/24 06/30/23: 15/24 07/20/23: 14/24 Goal status: ACHIEVED     LONG TERM GOALS: Target date: 07/18/2023  Patient will have improved function and activity level as evidenced by an increase in FOTO score by 8 points or more.  Baseline: 40 with target of 48 06/28/23: 39 with target of 48  Goal status: ONGOING   2.  Patient will perform five sit to stand repetitions in <=15 secs as evidence of LE strength that shows that this community dwelling older adult that is older is at a decreased risk of falling. (Buatois 2010)   Baseline: 0 reps; needs to use hands 06/28/23: 0 reps; needs to use hands 07/20/23: 0 reps 08/09/23: 3 reps  Goal status: ONGOING   3.  Patient will perform >=11 reps for 30 sec chair stands as evidence of improved LE endurance that  decreases her risk of falling.  Baseline: 0 reps 06/28/23: 0 reps; needs to use hands 08/11/23: 3 reps  Goal status: ONGOING   4.  Patient will improve distance by >=40 ft for improved aerobic endurance and increased mobility to resume yard work outside [employer], 2009).  Baseline: 175 ft 06/28/23: 229 ft 07/20/23: 250 ft  Goal status: ACHIEVED   5.  Patient will be able to negotiate >=5 stairs without modified independence as evidence of improved mobility and to decrease caregiver burden.  Baseline: Pt is safest using SPC to negotiate stairs  07/20/23: Able to negotiate 5 stairs with step to gait pattern with RLE going upwards and LLE going downwards and use of SPC  Goal status: Deferred   6. Patient will perform TUG in <13.5 sec as evidence of improved mobility and decreased risk for falling [facility] et al., 2000) Baseline: 20 sec 06/30/23: 17.57 sec 07/20/23: 22 sec 08/11/23: 16 sec  Goal status: PARTIALLY MET             7. Patient will demonstrate reduced falls risk as evidenced by Dynamic Gait Index (DGI) >19/24.  Baseline: 11/24 07/20/23: 14/24 08/11/23: 18/24 Goal status: ONGOING    PLAN:  PT FREQUENCY: 1-2x/week  PT DURATION: 10 weeks  PLANNED INTERVENTIONS: 97164- PT Re-evaluation, 97110-Therapeutic exercises, 97530- Therapeutic activity, 97112- Neuromuscular re-education, 97535- Self Care, 46962- Manual therapy, 97116- Gait training, [phone]- Orthotic Fit/training, 97014- Electrical stimulation (unattended), [phone]- Electrical stimulation (manual), Patient/Family education, Balance training, Stair training, Dry Needling, Joint mobilization, Joint manipulation, Spinal manipulation, Spinal mobilization, Vestibular training, DME instructions, Cryotherapy, and Moist heat  PLAN FOR NEXT SESSION: Reassess goals especially FOTO for 18th visit and final of authorization. Progress static and dynamic balance exercises: obstacle step overs, cone weaves and romberg.   Marge Shed  PT, DPT  [facility] [address], [place], [address] Phone: [phone]   Fax:  [phone]

## 2023-09-29 ENCOUNTER — Inpatient Hospital Stay: Attending: Oncology | Admitting: Oncology

## 2023-09-29 ENCOUNTER — Encounter: Payer: Self-pay | Admitting: Oncology

## 2023-09-29 ENCOUNTER — Ambulatory Visit: Admitting: Physical Therapy

## 2023-09-29 VITALS — BP 136/53 | HR 60 | Temp 97.7°F | Resp 18 | Ht 60.0 in | Wt 158.6 lb

## 2023-09-29 DIAGNOSIS — M25532 Pain in left wrist: Secondary | ICD-10-CM | POA: Diagnosis not present

## 2023-09-29 DIAGNOSIS — M545 Low back pain, unspecified: Secondary | ICD-10-CM | POA: Diagnosis not present

## 2023-09-29 DIAGNOSIS — I5032 Chronic diastolic (congestive) heart failure: Secondary | ICD-10-CM

## 2023-09-29 DIAGNOSIS — M4712 Other spondylosis with myelopathy, cervical region: Secondary | ICD-10-CM | POA: Diagnosis not present

## 2023-09-29 DIAGNOSIS — Z1331 Encounter for screening for depression: Secondary | ICD-10-CM | POA: Diagnosis not present

## 2023-09-29 DIAGNOSIS — D75839 Thrombocytosis, unspecified: Secondary | ICD-10-CM | POA: Diagnosis not present

## 2023-09-29 DIAGNOSIS — M19049 Primary osteoarthritis, unspecified hand: Secondary | ICD-10-CM | POA: Diagnosis not present

## 2023-09-29 DIAGNOSIS — M1812 Unilateral primary osteoarthritis of first carpometacarpal joint, left hand: Secondary | ICD-10-CM | POA: Diagnosis not present

## 2023-09-29 NOTE — Progress Notes (Signed)
 Hematology/Oncology Progress note [phone] [phone]           REFERRING PROVIDER: Antonio Baumgarten, MD   CHIEF COMPLAINTS/REASON FOR VISIT:  Evaluation of thrombocytosis   ASSESSMENT & PLAN:   Thrombocytosis Labs are reviewed and discussed with patient. Chronic intermittent thrombocytosis.   Workup showed normal iron panel, negative JAK2 V6 37F mutation with reflex, negative BCR-ABL 1 FISH.  Less likely myeloproliferative disease. Possibly due to chronic inflammation. Observation.   Orders Placed This Encounter  Procedures   CBC with Differential (Cancer Center Only)    Standing Status:   Future    Expected Date:   01/30/2024    Expiration Date:   09/28/2024   CMP (Cancer Center only)    Standing Status:   Future    Expected Date:   01/30/2024    Expiration Date:   09/28/2024   ANA w/Reflex    Standing Status:   Future    Expected Date:   01/30/2024    Expiration Date:   09/28/2024   Sedimentation rate    Standing Status:   Future    Expected Date:   01/30/2024    Expiration Date:   09/28/2024   C-reactive protein    Standing Status:   Future    Expected Date:   01/30/2024    Expiration Date:   09/28/2024   Follow-up in 4 months All questions were answered. The patient knows to call the clinic with any problems, questions or concerns.  Timmy Forbes, MD, PhD [facility] Hematology Oncology 09/29/2023   HISTORY OF PRESENTING ILLNESS:   Christine Zimmerman is a  78 y.o.  female with PMH listed below was seen in consultation at the request of  Antonio Baumgarten, MD  for evaluation of thrombocytosis.   She has history of elevated platelet counts intermittently since at least December 2021, with the most recent count being 605 x 10^9/L in April 2025. Previous counts include 522 x 10^9/L in January 2025, 465 x 10^9/L in September 2024, and 466 x 10^9/L in April 2023.  She denies any history of iron deficiency or other deficiencies, although she is a  vegetarian and takes iron supplements. Her B12 levels were checked six months ago and were normal. She is currently taking iron tablets.  She has a history of osteoarthritis, specifically affecting her knees, and experiences knee pain. She also had carpal tunnel syndrome, for which she received an injection and physical therapy. A steroid was applied during therapy, which caused swelling that has since resolved.  No family history of cancer or autoimmune diseases like rheumatoid arthritis or lupus. She is unaware of any family history of similar conditions.  No history of blood clots or abdominal surgeries. She confirms having her sple Patient is vegetarian  INTERVAL HISTORY Christine Zimmerman is a 78 y.o. female who has above history reviewed by me today presents for follow up visit for  Thrombocytosis. Patient presents to discuss results.  MEDICAL HISTORY:  Past Medical History:  Diagnosis Date   Atrial fibrillation (HCC)    CHF (congestive heart failure) (HCC)    Chronic pain    Coronary artery disease    Diabetes mellitus without complication (HCC)    GERD (gastroesophageal reflux disease)    High cholesterol    Hypertension    Hypothyroidism    Presence of permanent cardiac pacemaker     SURGICAL HISTORY: Past Surgical History:  Procedure Laterality Date   BACK SURGERY     CARDIAC  CATHETERIZATION Left 10/23/2015   Procedure: Left Heart Cath and Coronary Angiography;  Surgeon: Antonette Batters, MD;  Location: [facility] INVASIVE CV LAB;  Service: Cardiovascular;  Laterality: Left;   COLONOSCOPY N/A 04/29/2022   Procedure: COLONOSCOPY;  Surgeon: Quintin Buckle, DO;  Location: [facility] ENDOSCOPY;  Service: Gastroenterology;  Laterality: N/A;   COLONOSCOPY WITH PROPOFOL  N/A 01/03/2017   Procedure: COLONOSCOPY WITH PROPOFOL ;  Surgeon: Cassie Click, MD;  Location: [facility] ENDOSCOPY;  Service: Endoscopy;  Laterality: N/A;   COLONOSCOPY WITH PROPOFOL  N/A 04/12/2022   Procedure:  COLONOSCOPY WITH PROPOFOL ;  Surgeon: Quintin Buckle, DO;  Location: [facility] ENDOSCOPY;  Service: Gastroenterology;  Laterality: N/A;   CORONARY STENT PLACEMENT     x 4 at [facility]. x 1 by [place] in 2004   INSERT / REPLACE / REMOVE PACEMAKER     LEFT HEART CATH AND CORONARY ANGIOGRAPHY N/A 06/08/2017   Procedure: LEFT HEART CATH AND CORONARY ANGIOGRAPHY;  Surgeon: Antonette Batters, MD;  Location: [facility] INVASIVE CV LAB;  Service: Cardiovascular;  Laterality: N/A;   PACEMAKER IMPLANT N/A 11/03/2021   Procedure: PACEMAKER IMPLANT;  Surgeon: Percival Brace, MD;  Location: [facility] INVASIVE CV LAB;  Service: Cardiovascular;  Laterality: N/A;    SOCIAL HISTORY: Social History   Socioeconomic History   Marital status: Married    Spouse name: Not on file   Number of children: Not on file   Years of education: Not on file   Highest education level: Not on file  Occupational History   Not on file  Tobacco Use   Smoking status: Never   Smokeless tobacco: Never  Vaping Use   Vaping status: Never Used  Substance and Sexual Activity   Alcohol use: No   Drug use: No   Sexual activity: Not on file  Other Topics Concern   Not on file  Social History Narrative   Not on file   Social Drivers of Health   Financial Resource Strain: Medium Risk (09/29/2023)   Overall Financial Resource Strain (CARDIA)    Difficulty of Paying Living Expenses: Somewhat hard  Food Insecurity: No Food Insecurity (09/29/2023)   Hunger Vital Sign    Worried About Running Out of Food in the Last Year: Never true    Ran Out of Food in the Last Year: Never true  Recent Concern: Food Insecurity - Food Insecurity Present (09/29/2023)   Received from [facility]   Hunger Vital Sign    Worried About Running Out of Food in the Last Year: Sometimes true    Ran Out of Food in the Last Year: Sometimes true  Transportation Needs: No Transportation Needs (09/29/2023)   PRAPARE - Doctor, general practice (Medical): No    Lack of Transportation (Non-Medical): No  Recent Concern: Transportation Needs - Unmet Transportation Needs (09/29/2023)   Received from [facility] - Transportation    In the past 12 months, has lack of transportation kept you from medical appointments or from getting medications?: Yes    Lack of Transportation (Non-Medical): Yes  Physical Activity: Not on file  Stress: Not on file  Social Connections: Not on file  Intimate Partner Violence: Not on file    FAMILY HISTORY: Family History  Problem Relation Age of Onset   Breast cancer Neg Hx     ALLERGIES:  is allergic to biaxin [clarithromycin], farxiga [dapagliflozin], homatropine, januvia [sitagliptin], macrodantin [nitrofurantoin macrocrystal], naproxen, propoxyphene, tramadol, and penicillins.  MEDICATIONS:  Current Outpatient Medications  Medication Sig Dispense Refill   carvedilol  (COREG ) 6.25 MG tablet Take 1 tablet (6.25 mg total) by mouth 2 (two) times daily with a meal. 60 tablet 1   clopidogrel  (PLAVIX ) 75 MG tablet Take 75 mg by mouth daily.     diclofenac sodium (VOLTAREN) 1 % GEL Apply 2 g topically 4 (four) times daily as needed.     DULoxetine  (CYMBALTA ) 20 MG capsule Take 20 mg by mouth daily.     ferrous sulfate  325 (65 FE) MG tablet Take 1 tablet by mouth 2 (two) times daily with a meal.      furosemide  (LASIX ) 20 MG tablet Take 20 mg by mouth daily as needed for edema.     glipiZIDE  (GLUCOTROL  XL) 5 MG 24 hr tablet Take 1 tablet (5 mg total) by mouth 2 (two) times daily. 30 tablet 0   isosorbide  mononitrate (IMDUR ) 30 MG 24 hr tablet Take 30 mg by mouth daily.      levothyroxine  (SYNTHROID ) 88 MCG tablet Take 88 mcg by mouth daily before breakfast.     meloxicam  (MOBIC ) 7.5 MG tablet Take 7.5 mg by mouth daily.     metFORMIN  (GLUCOPHAGE ) 1000 MG tablet Take 1,000 mg by mouth daily with breakfast.      niacin  500 MG tablet Take 500 mg by mouth 2 (two)  times daily with a meal.     nitroGLYCERIN  (NITROLINGUAL ) 0.4 MG/SPRAY spray Place 1 spray under the tongue every 5 (five) minutes x 3 doses as needed for chest pain.     pantoprazole  (PROTONIX ) 40 MG tablet Take 40 mg by mouth every evening.     potassium chloride  (K-DUR,KLOR-CON ) 10 MEQ tablet Take 10 mEq by mouth daily as needed (with furosemide ).     pregabalin  (LYRICA ) 100 MG capsule Take 100 mg by mouth 3 (three) times daily.     No current facility-administered medications for this visit.    Review of Systems  Constitutional:  Positive for fatigue. Negative for appetite change, chills and fever.  HENT:   Negative for hearing loss.   Eyes:  Negative for eye problems.  Respiratory:  Negative for chest tightness and cough.   Cardiovascular:  Negative for chest pain.  Gastrointestinal:  Negative for abdominal distention, abdominal pain and blood in stool.  Endocrine: Negative for hot flashes.  Genitourinary:  Negative for difficulty urinating and frequency.   Musculoskeletal:  Positive for arthralgias.  Skin:  Negative for itching and rash.  Neurological:  Negative for extremity weakness.  Hematological:  Negative for adenopathy.  Psychiatric/Behavioral:  Negative for confusion.    PHYSICAL EXAMINATION: Vitals:   09/29/23 1432  BP: (!) 136/53  Pulse: 60  Resp: 18  Temp: 97.7 F (36.5 C)  SpO2: 97%   Filed Weights   09/29/23 1432  Weight: 158 lb 9.6 oz (71.9 kg)    Physical Exam Constitutional:      General: She is not in acute distress.    Appearance: She is obese.     Comments: Patient sits in a wheelchair.  HENT:     Head: Normocephalic and atraumatic.  Eyes:     General: No scleral icterus. Cardiovascular:     Rate and Rhythm: Normal rate.  Pulmonary:     Effort: Pulmonary effort is normal. No respiratory distress.  Abdominal:     General: There is no distension.  Musculoskeletal:        General: Normal range of motion.     Cervical back:  Normal range of  motion and neck supple.  Skin:    Findings: No rash.  Neurological:     Mental Status: She is alert and oriented to person, place, and time. Mental status is at baseline.  Psychiatric:        Mood and Affect: Mood normal.     LABORATORY DATA:  I have reviewed the data as listed    Latest Ref Rng & Units 09/08/2023    3:41 PM 10/14/2021   11:21 AM 09/28/2021    7:13 PM  CBC  WBC 4.0 - 10.5 K/uL 6.7  4.8  5.8   Hemoglobin 12.0 - 15.0 g/dL 04.5  40.9  81.1   Hematocrit 36.0 - 46.0 % 36.6  36.6  38.5   Platelets 150 - 400 K/uL 544  425  479       Latest Ref Rng & Units 10/14/2021   11:21 AM 09/28/2021    7:13 PM 11/10/2020   11:32 AM  CMP  Glucose 70 - 99 mg/dL 914  782  956   BUN 8 - 23 mg/dL 7  7  13    Creatinine 0.44 - 1.00 mg/dL 2.13  0.86  5.78   Sodium 135 - 145 mmol/L 121  127  122   Potassium 3.5 - 5.1 mmol/L 4.2  3.5  3.7   Chloride 98 - 111 mmol/L 90  89  89   CO2 22 - 32 mmol/L 25  29  25    Calcium  8.9 - 10.3 mg/dL 8.6  9.1  8.5   Total Protein 6.5 - 8.1 g/dL  6.6  6.6   Total Bilirubin 0.3 - 1.2 mg/dL  0.9  1.1   Alkaline Phos 38 - 126 U/L  101  89   AST 15 - 41 U/L  13  14   ALT 0 - 44 U/L  16  21       RADIOGRAPHIC STUDIES: I have personally reviewed the radiological images as listed and agreed with the findings in the report. No results found.

## 2023-09-29 NOTE — Assessment & Plan Note (Signed)
 Labs are reviewed and discussed with patient. Chronic intermittent thrombocytosis.   Workup showed normal iron panel, negative JAK2 V6 88F mutation with reflex, negative BCR-ABL 1 FISH.  Less likely myeloproliferative disease. Possibly due to chronic inflammation. Observation.

## 2023-09-29 NOTE — Progress Notes (Signed)
 No concerns today

## 2023-10-04 ENCOUNTER — Ambulatory Visit: Admitting: Physical Therapy

## 2023-10-06 ENCOUNTER — Ambulatory Visit: Admitting: Physical Therapy

## 2023-10-06 DIAGNOSIS — R2689 Other abnormalities of gait and mobility: Secondary | ICD-10-CM

## 2023-10-06 DIAGNOSIS — M6281 Muscle weakness (generalized): Secondary | ICD-10-CM

## 2023-10-06 DIAGNOSIS — R262 Difficulty in walking, not elsewhere classified: Secondary | ICD-10-CM

## 2023-10-06 NOTE — Therapy (Addendum)
 OUTPATIENT PHYSICAL THERAPY LOWER EXTREMITY TREATMENT   Patient Name: Christine Zimmerman MRN: [id number] DOB:02/11/1946, 78 y.o., female Today's Date: 10/06/2023  END OF SESSION:  PT End of Session - 10/06/23 1614     Visit Number 11    Number of Visits 16    Date for PT Re-Evaluation 10/18/23    Authorization Type [employer] 2025    Authorization Time Period auth 16 visits  06/28/23-10/21/23 auth# [id number]    Authorization - Visit Number 11    Authorization - Number of Visits 16    Progress Note Due on Visit 16    PT Start Time 1605    PT Stop Time 1645    PT Time Calculation (min) 40 min    Equipment Utilized During Treatment Gait belt    Activity Tolerance Patient tolerated treatment well    Behavior During Therapy WFL for tasks assessed/performed                          Past Medical History:  Diagnosis Date   Atrial fibrillation (HCC)    CHF (congestive heart failure) (HCC)    Chronic pain    Coronary artery disease    Diabetes mellitus without complication (HCC)    GERD (gastroesophageal reflux disease)    High cholesterol    Hypertension    Hypothyroidism    Presence of permanent cardiac pacemaker    Past Surgical History:  Procedure Laterality Date   BACK SURGERY     CARDIAC CATHETERIZATION Left 10/23/2015   Procedure: Left Heart Cath and Coronary Angiography;  Surgeon: Antonette Batters, MD;  Location: [facility] INVASIVE CV LAB;  Service: Cardiovascular;  Laterality: Left;   COLONOSCOPY N/A 04/29/2022   Procedure: COLONOSCOPY;  Surgeon: Quintin Buckle, DO;  Location: [facility] ENDOSCOPY;  Service: Gastroenterology;  Laterality: N/A;   COLONOSCOPY WITH PROPOFOL  N/A 01/03/2017   Procedure: COLONOSCOPY WITH PROPOFOL ;  Surgeon: Cassie Click, MD;  Location: [facility] ENDOSCOPY;  Service: Endoscopy;  Laterality: N/A;   COLONOSCOPY WITH PROPOFOL  N/A 04/12/2022   Procedure: COLONOSCOPY WITH PROPOFOL ;  Surgeon: Quintin Buckle, DO;  Location: [facility]  ENDOSCOPY;  Service: Gastroenterology;  Laterality: N/A;   CORONARY STENT PLACEMENT     x 4 at [facility]. x 1 by [place] in 2004   INSERT / REPLACE / REMOVE PACEMAKER     LEFT HEART CATH AND CORONARY ANGIOGRAPHY N/A 06/08/2017   Procedure: LEFT HEART CATH AND CORONARY ANGIOGRAPHY;  Surgeon: Antonette Batters, MD;  Location: [facility] INVASIVE CV LAB;  Service: Cardiovascular;  Laterality: N/A;   PACEMAKER IMPLANT N/A 11/03/2021   Procedure: PACEMAKER IMPLANT;  Surgeon: Percival Brace, MD;  Location: [facility] INVASIVE CV LAB;  Service: Cardiovascular;  Laterality: N/A;   Patient Active Problem List   Diagnosis Date Noted   Thrombocytosis 09/08/2023   Sick sinus syndrome (HCC) 11/03/2021   Weakness generalized 05/09/2019   Chronic diastolic heart failure (HCC) 03/23/2018   HTN (hypertension) 03/23/2018   DM (diabetes mellitus) (HCC) 03/23/2018   SOB (shortness of breath) 02/01/2018   Dizziness 08/01/2017   TIA (transient ischemic attack) 10/17/2015   Hyponatremia 10/17/2015   Anemia 10/17/2015   Chest pain 10/16/2015   Left-sided weakness 10/16/2015   Hypotension 10/16/2015   Bradycardia 10/16/2015   Hypoglycemia 10/16/2015    PCP: Dr. Antonio Baumgarten   REFERRING PROVIDER: Dr. Antonio Baumgarten   REFERRING DIAG:  M25.569 (ICD-10-CM) - Pain in joint, lower leg  M25.561 (ICD-10-CM) - Pain in  right knee  M25.562 (ICD-10-CM) - Pain in left knee  M79.609 (ICD-10-CM) - Pain in limb  M79.604 (ICD-10-CM) - Pain in right leg  M79.605 (ICD-10-CM) - Pain in left leg    THERAPY DIAG:  No diagnosis found.  Rationale for Evaluation and Treatment: Rehabilitation  ONSET DATE: 04/2019  SUBJECTIVE:   SUBJECTIVE STATEMENT: Pt states that she continues to experience increased pain in her left knee but this is not much of a change and it is something that she has learned ot live with.   PERTINENT HISTORY: Pt reports ongoing knee pain that has been making it difficult for her to move and  she has been steadily becoming less mobile. She used to go to water aerobics and that is where she got most of her exercise, but she is afraid of falling and this keeps her from going to the class at the [facility]. Pt is a diabetic and she has diabetic neuropathy in her feet. She now has to use a single point cane to avoid from falling.  PAIN:  Are you having pain? Yes: NPRS scale: 2-3/10 Pain location: Anterior surface of left knee  Pain description: Achy  Aggravating factors: Walking and standing or basically any weight bearing, stairs  Relieving factors: Vicks helps her go to sleep.   PRECAUTIONS: None  RED FLAGS: None   WEIGHT BEARING RESTRICTIONS: No  FALLS:  Has patient fallen in last 6 months? No  LIVING ENVIRONMENT: Lives with: lives with their spouse Lives in: House/apartment Stairs: Yes: External: 5 steps; on right going up Has following equipment at home: Single point cane and Walker - 2 wheeled  OCCUPATION: Retired   PLOF: Independent  PATIENT GOALS: To be able to walk without an assistive device and to get around her home and upstairs without help   NEXT MD VISIT: January 2025   OBJECTIVE:  Note: Objective measures were completed at Evaluation unless otherwise noted.  VITALS: 197/78 in sitting                 195/78 in sitting   DIAGNOSTIC FINDINGS:   AP and lateral x-rays of the lumbar spine were ordered and personally  reviewed today. These show extensive degenerative disc disease with  extensive osteophyte formation, mild SI arthritis on the AP view, no  scoliosis, pedicles intact. Lateral view shows severe degenerative changes  to the L4-5 disc space with extensive facet arthritis. No  spondylolisthesis, no fracture.  X-ray impression: Extensive degenerative disc and facet arthritis, lumbar  spine.   PATIENT SURVEYS:  FOTO 40 with target of    COGNITION: Overall cognitive status: Within functional limits for tasks assessed     SENSATION: Not  tested  MUSCLE LENGTH:  Not performed   POSTURE: rounded shoulders  PALPATION: None performed    LOWER EXTREMITY ROM:  WFL   LOWER EXTREMITY MMT:   (Blank rows = not tested)  LOWER EXTREMITY SPECIAL TESTS: None performed   FUNCTIONAL TESTS:  5 times sit to stand: 0 reps 30 seconds chair stand test: 0 reps  Timed up and go (TUG): Not tested  2 minute walk test: 170 ft with use of single point cane  10 meter walk test: NT  Dynamic Gait Index: NT  MCTSIB: Condition 1: Avg of 3 trials: NT sec, Condition 2: Avg of 3 trials: NT sec, Condition 3: Avg of 3 trials: NT sec, Condition 4: Avg of 3 trials: NT sec, and Total Score: NT   GAIT: Distance walked: 50  ft  Assistive device utilized: Single point cane Level of assistance: Modified independence Comments: Right sided antalgic gait                                                                                                                                 TREATMENT DATE:   10/06/23:  THEREX:  Nu-Step with seat and arms at 7 for 5 MIN  Long Arch Quad with #3 AW 2 x 10  -Pt reports increased left knee pain  Long Arch Quad with yellow band 2 x 10  -Pt reports increased left knee pain  Mini-Squat with BUE support to above 90 degree knee and hip flexion 2 x 10    PHYSICAL PERFORMANCE   5 x STS: 6 reps use of hands   30 sec chair stands: 6 reps use of hands   TUG:  Trial 1: 14.48 sec with use of SPC   Trial 2: 12.16 sec with use of SPC   Average: 13.32     09/27/23: THEREX  Nu-Step for 6 min with seat and arms at 5 for 2 min  Heels to toes with BUE support 2 x 10   Heel to toe raises 1 UE support 2 x 10  Stair Negotiation 6 steps ascending and descending stairs with 1 UE support  and step to pattern  -Pt able to perform with minimal verbal cues and with correct LE ascending and descending stairs.   GAIT TRAINING  Step through pattern with single point cane on RUE 10 meters x 20  -mod VC to keep the cane before  stepping with LLE    NMR 10 meter step over 2x- #3 Ankle weight with use of single point cane  x  10   -mod VC to maintain single point cane in RUE   Good Mornings 2 x 10  Good Monrings with #8 DB 2 x 10   -Loss of balance with anterior lean      09/19/23 THEREX  Nu-Step for 6 min with seat and arms at 5 for 2 min  Sit to Stand with 20 inch 3 x 8   -min VC to increase stand and decrease speed of eccentric speed.  -min VC to increase distance of feet spacing to increase base of support  10 M x 10 with rollator   -RPE 5/10  Heel Raises with BUE support 1 x 10  Heel Raises with BUE support with #5 AW 2 x 10  -Pt unable to achieve extended knee on right due to increased pain Standing Marches with BUE support with #5 AW 1 x 10  Seated with Marches with RLE #5 AW 1 x 10  -Unable to raise RLE all the way  Seated with Marches with RLE AROM 1 x 10     PATIENT EDUCATION:  Education details: Educated pt on use of pain relief drugs to avoid limitations of pain and that she should speak  to her physician if she is concerned about what pain medications to take.   Person educated: Patient and Spouse Education method: Explanation, Demonstration, Verbal cues, and Handouts Education comprehension: verbalized understanding, returned demonstration, and verbal cues required  HOME EXERCISE PROGRAM: Access Code: [email or web address] URL: [email or web address] Date: 10/06/2023 Prepared by: Marge Shed  Exercises - Long Sitting Calf Stretch with Strap  - 1 x daily - 3 reps - 60 sec sec  hold - Seated Plantar Fascia Mobilization with Small Ball  - 1 x daily - 7 x weekly - 10 reps - Heel Toe Raises with Counter Support  - 3-4 x weekly - 3 sets - 10 reps - Mini Squat with Counter Support  - 3-4 x weekly - 3 sets - 10 reps   ASSESSMENT:  CLINICAL IMPRESSION: Pt shows some progress towards goals with improved mobility and LE strength reducing her risk of falls. She continues to be limited by  left knee pain and sciatica pain especially when performing exercises that require increased knee flexion. Modified HEP to include only LE strengthening exercises with reduced knee flexion to avoid pain exacerbation. Pt was able to better tolerate these exercises compared to sit to stand and long arch quads. She will continue to benefit from skilled PT to improve LE strength and aerobic endurance to improve mobility and decrease risk of falling while performing activities of daily living like grocery shopping or negotiating stairs.     OBJECTIVE IMPAIRMENTS: Abnormal gait, decreased balance, decreased endurance, difficulty walking, decreased strength, and pain.   ACTIVITY LIMITATIONS: carrying, lifting, bending, standing, squatting, stairs, transfers, and locomotion level  PARTICIPATION LIMITATIONS: meal prep, cleaning, laundry, driving, shopping, community activity, and church  PERSONAL FACTORS: Age, Past/current experiences, Time since onset of injury/illness/exacerbation, and 3+ comorbidities: HTN, T2DM, Afib are also affecting patient's functional outcome.   REHAB POTENTIAL: Good  CLINICAL DECISION MAKING: Stable/uncomplicated  EVALUATION COMPLEXITY: Low   GOALS: Goals reviewed with patient? No  SHORT TERM GOALS: Target date: 05/23/2023  PT reviewed the following HEP with patient with patient able to demonstrate a set of the following with min cuing for correction needed. PT educated patient on parameters of therex (how/when to inc/decrease intensity, frequency, rep/set range, stretch hold time, and purpose of therex) with verbalized understanding.  Baseline:NT  05/24/23: Performing independently   Goal status: ACHIEVED   2.  Patient will be able to perform a sit to stand without use of upper extremity support from 18 inch seated surface as evidence of improved LE strength.  Baseline: Needs to use hands 06/28/23: Needs to use hands still or push against knees 07/20/23: Needs to use one hand  to push up 08/11/23: 3 reps  Goal status: ACHIEVED   3. Patient will demonstrate an improvement in DGI score of >=2 pts as evidence of improved dynamic balance to decrease risk of falling (Pardasaney et al, 2012).            Baseline: 11/24 06/30/23: 15/24 07/20/23: 14/24 Goal status: ACHIEVED     LONG TERM GOALS: Target date: 10/18/2023  Patient will have improved function and activity level as evidenced by an increase in FOTO score by 8 points or more.  Baseline: 40 with target of 48 06/28/23: 39 with target of 48  Goal status: DEFERRED    2.  Patient will perform five sit to stand repetitions in <=15 secs as evidence of LE strength that shows that this community dwelling older adult that is older is at a decreased risk of  falling. (Buatois 2010)   Baseline: 0 reps; needs to use hands 06/28/23: 0 reps; needs to use hands 07/20/23: 0 reps 08/09/23: 3 reps 10/06/23: 6 reps; uses hands   Goal status: ONGOING   3.  Patient will perform >=11 reps for 30 sec chair stands as evidence of improved LE endurance that decreases her risk of falling.  Baseline: 0 reps 06/28/23: 0 reps; needs to use hands 08/11/23: 3 reps 10/06/23: 6 reps; uses hands  Goal status: ONGOING   4.  Patient will improve distance by >=40 ft for improved aerobic endurance and increased mobility to resume yard work outside [employer], 2009).  Baseline: 175 ft 06/28/23: 229 ft 07/20/23: 250 ft  Goal status: ACHIEVED   5.  Patient will be able to negotiate >=5 stairs without modified independence as evidence of improved mobility and to decrease caregiver burden.  Baseline: Pt is safest using SPC to negotiate stairs  07/20/23: Able to negotiate 5 stairs with step to gait pattern with RLE going upwards and LLE going downwards and use of SPC  Goal status: Deferred   6. Patient will perform TUG in <13.5 sec as evidence of improved mobility and decreased risk for falling [facility] et al., 2000) Baseline: 20 sec 06/30/23: 17.57 sec  07/20/23: 22 sec 08/11/23: 16 sec 10/06/23: 13.32 sec   Goal status: ACHIEVED            7. Patient will demonstrate reduced falls risk as evidenced by Dynamic Gait Index (DGI) >19/24.  Baseline: 11/24 07/20/23: 14/24 08/11/23: 18/24 Goal status: ONGOING    PLAN:  PT FREQUENCY: 1-2x/week  PT DURATION: 10 weeks  PLANNED INTERVENTIONS: 97164- PT Re-evaluation, 97110-Therapeutic exercises, 97530- Therapeutic activity, 97112- Neuromuscular re-education, 97535- Self Care, 09811- Manual therapy, 97116- Gait training, [phone]- Orthotic Fit/training, 97014- Electrical stimulation (unattended), [phone]- Electrical stimulation (manual), Patient/Family education, Balance training, Stair training, Dry Needling, Joint mobilization, Joint manipulation, Spinal manipulation, Spinal mobilization, Vestibular training, DME instructions, Cryotherapy, and Moist heat  PLAN FOR NEXT SESSION:  DGI. Determine LE strength from elevated surface that does not provoke pt's sciatica. Continue to incorporate quad and glute strengthening exercises that require decreased knee flexion.  Progress static and dynamic balance exercises: obstacle step overs, cone weaves and romberg.   Marge Shed PT, DPT  [facility] [address] [place], [address] Phone: [phone]   Fax:  [phone]

## 2023-10-11 ENCOUNTER — Ambulatory Visit: Admitting: Physical Therapy

## 2023-10-11 DIAGNOSIS — R2689 Other abnormalities of gait and mobility: Secondary | ICD-10-CM

## 2023-10-11 DIAGNOSIS — M6281 Muscle weakness (generalized): Secondary | ICD-10-CM | POA: Diagnosis not present

## 2023-10-11 DIAGNOSIS — R262 Difficulty in walking, not elsewhere classified: Secondary | ICD-10-CM

## 2023-10-11 NOTE — Therapy (Signed)
 OUTPATIENT PHYSICAL THERAPY LOWER EXTREMITY TREATMENT   Patient Name: Christine Zimmerman MRN: [id number] DOB:December 11, 1945, 78 y.o., female Today's Date: 10/11/2023  END OF SESSION:  PT End of Session - 10/11/23 1011     Visit Number 12    Number of Visits 16    Date for PT Re-Evaluation 10/18/23    Authorization Type [employer] 2025    Authorization Time Period auth 16 visits  06/28/23-10/21/23 auth# [id number]    Authorization - Visit Number 12    Authorization - Number of Visits 16    Progress Note Due on Visit 16    PT Start Time 0950    PT Stop Time 1030    PT Time Calculation (min) 40 min    Equipment Utilized During Treatment Gait belt    Activity Tolerance Patient tolerated treatment well    Behavior During Therapy WFL for tasks assessed/performed                          Past Medical History:  Diagnosis Date   Atrial fibrillation (HCC)    CHF (congestive heart failure) (HCC)    Chronic pain    Coronary artery disease    Diabetes mellitus without complication (HCC)    GERD (gastroesophageal reflux disease)    High cholesterol    Hypertension    Hypothyroidism    Presence of permanent cardiac pacemaker    Past Surgical History:  Procedure Laterality Date   BACK SURGERY     CARDIAC CATHETERIZATION Left 10/23/2015   Procedure: Left Heart Cath and Coronary Angiography;  Surgeon: Antonette Batters, MD;  Location: [facility] INVASIVE CV LAB;  Service: Cardiovascular;  Laterality: Left;   COLONOSCOPY N/A 04/29/2022   Procedure: COLONOSCOPY;  Surgeon: Quintin Buckle, DO;  Location: [facility] ENDOSCOPY;  Service: Gastroenterology;  Laterality: N/A;   COLONOSCOPY WITH PROPOFOL  N/A 01/03/2017   Procedure: COLONOSCOPY WITH PROPOFOL ;  Surgeon: Cassie Click, MD;  Location: [facility] ENDOSCOPY;  Service: Endoscopy;  Laterality: N/A;   COLONOSCOPY WITH PROPOFOL  N/A 04/12/2022   Procedure: COLONOSCOPY WITH PROPOFOL ;  Surgeon: Quintin Buckle, DO;  Location: [facility]  ENDOSCOPY;  Service: Gastroenterology;  Laterality: N/A;   CORONARY STENT PLACEMENT     x 4 at [facility]. x 1 by [place] in 2004   INSERT / REPLACE / REMOVE PACEMAKER     LEFT HEART CATH AND CORONARY ANGIOGRAPHY N/A 06/08/2017   Procedure: LEFT HEART CATH AND CORONARY ANGIOGRAPHY;  Surgeon: Antonette Batters, MD;  Location: [facility] INVASIVE CV LAB;  Service: Cardiovascular;  Laterality: N/A;   PACEMAKER IMPLANT N/A 11/03/2021   Procedure: PACEMAKER IMPLANT;  Surgeon: Percival Brace, MD;  Location: [facility] INVASIVE CV LAB;  Service: Cardiovascular;  Laterality: N/A;   Patient Active Problem List   Diagnosis Date Noted   Thrombocytosis 09/08/2023   Sick sinus syndrome (HCC) 11/03/2021   Weakness generalized 05/09/2019   Chronic diastolic heart failure (HCC) 03/23/2018   HTN (hypertension) 03/23/2018   DM (diabetes mellitus) (HCC) 03/23/2018   SOB (shortness of breath) 02/01/2018   Dizziness 08/01/2017   TIA (transient ischemic attack) 10/17/2015   Hyponatremia 10/17/2015   Anemia 10/17/2015   Chest pain 10/16/2015   Left-sided weakness 10/16/2015   Hypotension 10/16/2015   Bradycardia 10/16/2015   Hypoglycemia 10/16/2015    PCP: Dr. Antonio Baumgarten   REFERRING PROVIDER: Dr. Antonio Baumgarten   REFERRING DIAG:  M25.569 (ICD-10-CM) - Pain in joint, lower leg  M25.561 (ICD-10-CM) - Pain in  right knee  M25.562 (ICD-10-CM) - Pain in left knee  M79.609 (ICD-10-CM) - Pain in limb  M79.604 (ICD-10-CM) - Pain in right leg  M79.605 (ICD-10-CM) - Pain in left leg    THERAPY DIAG:  Difficulty in walking, not elsewhere classified  Imbalance  Rationale for Evaluation and Treatment: Rehabilitation  ONSET DATE: 04/2019  SUBJECTIVE:   SUBJECTIVE STATEMENT: Pt reports ongoing knee pain and she thinks part of it is due to the rainy weather.   PERTINENT HISTORY: Pt reports ongoing knee pain that has been making it difficult for her to move and she has been steadily becoming less  mobile. She used to go to water aerobics and that is where she got most of her exercise, but she is afraid of falling and this keeps her from going to the class at the [facility]. Pt is a diabetic and she has diabetic neuropathy in her feet. She now has to use a single point cane to avoid from falling.  PAIN:  Are you having pain? Yes: NPRS scale: 2-3/10 Pain location: Anterior surface of left knee  Pain description: Achy  Aggravating factors: Walking and standing or basically any weight bearing, stairs  Relieving factors: Vicks helps her go to sleep.   PRECAUTIONS: None  RED FLAGS: None   WEIGHT BEARING RESTRICTIONS: No  FALLS:  Has patient fallen in last 6 months? No  LIVING ENVIRONMENT: Lives with: lives with their spouse Lives in: House/apartment Stairs: Yes: External: 5 steps; on right going up Has following equipment at home: Single point cane and Walker - 2 wheeled  OCCUPATION: Retired   PLOF: Independent  PATIENT GOALS: To be able to walk without an assistive device and to get around her home and upstairs without help   NEXT MD VISIT: January 2025   OBJECTIVE:  Note: Objective measures were completed at Evaluation unless otherwise noted.  VITALS: 197/78 in sitting                 195/78 in sitting   DIAGNOSTIC FINDINGS:   AP and lateral x-rays of the lumbar spine were ordered and personally  reviewed today. These show extensive degenerative disc disease with  extensive osteophyte formation, mild SI arthritis on the AP view, no  scoliosis, pedicles intact. Lateral view shows severe degenerative changes  to the L4-5 disc space with extensive facet arthritis. No  spondylolisthesis, no fracture.  X-ray impression: Extensive degenerative disc and facet arthritis, lumbar  spine.   PATIENT SURVEYS:  FOTO 40 with target of    COGNITION: Overall cognitive status: Within functional limits for tasks assessed     SENSATION: Not tested  MUSCLE LENGTH:  Not performed    POSTURE: rounded shoulders  PALPATION: None performed    LOWER EXTREMITY ROM:  WFL   LOWER EXTREMITY MMT:   (Blank rows = not tested)  LOWER EXTREMITY SPECIAL TESTS: None performed   FUNCTIONAL TESTS:  5 times sit to stand: 0 reps 30 seconds chair stand test: 0 reps  Timed up and go (TUG): Not tested  2 minute walk test: 170 ft with use of single point cane  10 meter walk test: NT  Dynamic Gait Index: NT  MCTSIB: Condition 1: Avg of 3 trials: NT sec, Condition 2: Avg of 3 trials: NT sec, Condition 3: Avg of 3 trials: NT sec, Condition 4: Avg of 3 trials: NT sec, and Total Score: NT   GAIT: Distance walked: 50 ft  Assistive device utilized: Single point cane Level of  assistance: Modified independence Comments: Right sided antalgic gait                                                                                                                                 TREATMENT DATE:   10/11/23: Physical Performance   Dynamic Gait Index- Did not use single point cane   -Item 1: Gait level surface- mild impairment  2 -Item 2: Change in gait speed- mild impairment  2 -Item 3: Gait with horizontal head turns - mild impairment  2 -Item 4: Gait with vertical vertical turns- Normal  3 Item 5: Gait with pivot turn-mild impairment 2 -Item 6: Step Over Obstacle- severe impairment  0 -Item 7: Step Around Obstacles- Normal  3  -Item 8: Steps- Moderate Impairment-  1 Total: 16/24 >19/24 indicates increased risk for falls   THEREX   Seated HS Stretch 6 x 60 sec   Seated Hip ER stretch 4 x 60 sec with use of towels to position leg   -Pt reports increased right knee pain  Supine Hip ER stretch 2 x 60 sec  -Pt reports no increased knee pain  Sit to Stand from  20.5" mat surface with 1 UE support 1 x 10  Sit to Stand from 20.5" mat surface with 1 UE support 1 x 10   10/06/23:  THEREX:  Nu-Step with seat and arms at 7 for 5 MIN  Long Arch Quad with #3 AW 2 x 10  -Pt reports  increased left knee pain  Long Arch Quad with yellow band 2 x 10  -Pt reports increased left knee pain  Mini-Squat with BUE support to above 90 degree knee and hip flexion 2 x 10    PHYSICAL PERFORMANCE   5 x STS: 6 reps use of hands   30 sec chair stands: 6 reps use of hands   TUG:  Trial 1: 14.48 sec with use of SPC   Trial 2: 12.16 sec with use of SPC   Average: 13.32     09/27/23: THEREX  Nu-Step for 6 min with seat and arms at 5 for 2 min  Heels to toes with BUE support 2 x 10   Heel to toe raises 1 UE support 2 x 10  Stair Negotiation 6 steps ascending and descending stairs with 1 UE support  and step to pattern  -Pt able to perform with minimal verbal cues and with correct LE ascending and descending stairs.   GAIT TRAINING  Step through pattern with single point cane on RUE 10 meters x 20  -mod VC to keep the cane before stepping with LLE    NMR 10 meter step over 2x- #3 Ankle weight with use of single point cane  x  10   -mod VC to maintain single point cane in RUE   Good Mornings 2 x 10  Good Monrings with #8 DB 2 x 10   -Loss of  balance with anterior lean      09/19/23 THEREX  Nu-Step for 6 min with seat and arms at 5 for 2 min  Sit to Stand with 20 inch 3 x 8   -min VC to increase stand and decrease speed of eccentric speed.  -min VC to increase distance of feet spacing to increase base of support  10 M x 10 with rollator   -RPE 5/10  Heel Raises with BUE support 1 x 10  Heel Raises with BUE support with #5 AW 2 x 10  -Pt unable to achieve extended knee on right due to increased pain Standing Marches with BUE support with #5 AW 1 x 10  Seated with Marches with RLE #5 AW 1 x 10  -Unable to raise RLE all the way  Seated with Marches with RLE AROM 1 x 10     PATIENT EDUCATION:  Education details: Educated pt on use of pain relief drugs to avoid limitations of pain and that she should speak to her physician if she is concerned about what pain medications to  take.   Person educated: Patient and Spouse Education method: Explanation, Demonstration, Verbal cues, and Handouts Education comprehension: verbalized understanding, returned demonstration, and verbal cues required  HOME EXERCISE PROGRAM: Access Code: [email or web address] URL: [email or web address] Date: 10/11/2023 Prepared by: Marge Shed  Exercises - Seated Plantar Fascia Mobilization with Small Ball  - 1 x daily - 7 x weekly - 10 reps - Seated Hamstring Stretch  - 1 x daily - 7 x weekly - 3 reps - 60 sec  hold - Supine Hip External Rotation Stretch  - 1 x daily - 7 x weekly - 3 reps - 60 sec hold - Sit to Stand with Armchair  - 3-4 x weekly - 3 sets - 10 reps  ASSESSMENT:  CLINICAL IMPRESSION: Pt demonstrates ongoing dynamic balance deficits especially when negotiating stairs and stepping over obstacles that have not improved since last reassessment. Limitations are likely from ongoing pain from OA especially in knees. Deferring DGI goal due to pt's ambulatory status being that of a household ambulatory and the test being to high of difficulty for her with likely floor effect. Better test may be BERG balance, but will defer to another episode of care.  She did show an improvement in sit to stand tolerance with no onset of sciatica likely due to not performing sit to stand after vigorous physical activity like session. Modified HEP to decrease number of exercises including sit to stand and supine hip ER to avoid knee pain exacerbation. She will continue to benefit from skilled PT to improve LE strength and aerobic endurance to improve mobility and decrease risk of falling while performing activities of daily living like grocery shopping or negotiating stairs.  OBJECTIVE IMPAIRMENTS: Abnormal gait, decreased balance, decreased endurance, difficulty walking, decreased strength, and pain.   ACTIVITY LIMITATIONS: carrying, lifting, bending, standing, squatting, stairs, transfers, and  locomotion level  PARTICIPATION LIMITATIONS: meal prep, cleaning, laundry, driving, shopping, community activity, and church  PERSONAL FACTORS: Age, Past/current experiences, Time since onset of injury/illness/exacerbation, and 3+ comorbidities: HTN, T2DM, Afib are also affecting patient's functional outcome.   REHAB POTENTIAL: Good  CLINICAL DECISION MAKING: Stable/uncomplicated  EVALUATION COMPLEXITY: Low   GOALS: Goals reviewed with patient? No  SHORT TERM GOALS: Target date: 05/23/2023  PT reviewed the following HEP with patient with patient able to demonstrate a set of the following with min cuing for correction needed. PT educated patient on parameters of therex (  how/when to inc/decrease intensity, frequency, rep/set range, stretch hold time, and purpose of therex) with verbalized understanding.  Baseline:NT  05/24/23: Performing independently   Goal status: ACHIEVED   2.  Patient will be able to perform a sit to stand without use of upper extremity support from 18 inch seated surface as evidence of improved LE strength.  Baseline: Needs to use hands 06/28/23: Needs to use hands still or push against knees 07/20/23: Needs to use one hand to push up 08/11/23: 3 reps  Goal status: ACHIEVED   3. Patient will demonstrate an improvement in DGI score of >=2 pts as evidence of improved dynamic balance to decrease risk of falling (Pardasaney et al, 2012).            Baseline: 11/24 06/30/23: 15/24 07/20/23: 14/24 Goal status: ACHIEVED     LONG TERM GOALS: Target date: 10/18/2023  Patient will have improved function and activity level as evidenced by an increase in FOTO score by 8 points or more.  Baseline: 40 with target of 48 06/28/23: 39 with target of 48  Goal status: DEFERRED    2.  Patient will perform five sit to stand repetitions in <=15 secs as evidence of LE strength that shows that this community dwelling older adult that is older is at a decreased risk of falling. (Buatois 2010)    Baseline: 0 reps; needs to use hands 06/28/23: 0 reps; needs to use hands 07/20/23: 0 reps 08/09/23: 3 reps 10/06/23: 6 reps; uses hands   Goal status: ONGOING   3.  Patient will perform >=11 reps for 30 sec chair stands as evidence of improved LE endurance that decreases her risk of falling.  Baseline: 0 reps 06/28/23: 0 reps; needs to use hands 08/11/23: 3 reps 10/06/23: 6 reps; uses hands  Goal status: ONGOING   4.  Patient will improve distance by >=40 ft for improved aerobic endurance and increased mobility to resume yard work outside [employer], 2009).  Baseline: 175 ft 06/28/23: 229 ft 07/20/23: 250 ft  Goal status: ACHIEVED   5.  Patient will be able to negotiate >=5 stairs without modified independence as evidence of improved mobility and to decrease caregiver burden.  Baseline: Pt is safest using SPC to negotiate stairs  07/20/23: Able to negotiate 5 stairs with step to gait pattern with RLE going upwards and LLE going downwards and use of SPC  Goal status: Deferred   6. Patient will perform TUG in <13.5 sec as evidence of improved mobility and decreased risk for falling [facility] et al., 2000) Baseline: 20 sec 06/30/23: 17.57 sec 07/20/23: 22 sec 08/11/23: 16 sec 10/06/23: 13.32 sec   Goal status: ACHIEVED            7. Patient will demonstrate reduced falls risk as evidenced by Dynamic Gait Index (DGI) >19/24.  Baseline: 11/24 07/20/23: 14/24 08/11/23: 18/24 10/11/23: 16/24  Goal status: Deferred    PLAN:  PT FREQUENCY: 1-2x/week  PT DURATION: 10 weeks  PLANNED INTERVENTIONS: 97164- PT Re-evaluation, 97110-Therapeutic exercises, 97530- Therapeutic activity, 97112- Neuromuscular re-education, 97535- Self Care, 16109- Manual therapy, 97116- Gait training, [phone]- Orthotic Fit/training, 97014- Electrical stimulation (unattended), [phone]- Electrical stimulation (manual), Patient/Family education, Balance training, Stair training, Dry Needling, Joint mobilization, Joint manipulation,  Spinal manipulation, Spinal mobilization, Vestibular training, DME instructions, Cryotherapy, and Moist heat  PLAN FOR NEXT SESSION:   Continue to incorporate quad and glute strengthening exercises that require decreased knee flexion: standing hip extension and standing hip abduction.  Progress static and dynamic balance exercises: obstacle step overs, cone weaves and romberg.   Marge Shed PT, DPT  [facility] [address], [place], [address] Phone: [phone]   Fax:  [phone]

## 2023-10-13 ENCOUNTER — Ambulatory Visit: Admitting: Physical Therapy

## 2023-10-13 DIAGNOSIS — R262 Difficulty in walking, not elsewhere classified: Secondary | ICD-10-CM | POA: Diagnosis not present

## 2023-10-13 DIAGNOSIS — M6281 Muscle weakness (generalized): Secondary | ICD-10-CM | POA: Diagnosis not present

## 2023-10-13 DIAGNOSIS — R2689 Other abnormalities of gait and mobility: Secondary | ICD-10-CM

## 2023-10-13 NOTE — Therapy (Signed)
 OUTPATIENT PHYSICAL THERAPY LOWER EXTREMITY TREATMENT   Patient Name: Christine Zimmerman MRN: [id number] DOB:05/26/1945, 78 y.o., female Today's Date: 10/13/2023  END OF SESSION:  PT End of Session - 10/13/23 1043     Visit Number 13    Number of Visits 16    Date for PT Re-Evaluation 10/18/23    Authorization Type [employer] 2025    Authorization Time Period auth 16 visits  06/28/23-10/21/23 auth# [id number]    Authorization - Visit Number 13    Authorization - Number of Visits 16    Progress Note Due on Visit 16    PT Start Time 1035    PT Stop Time 1115    PT Time Calculation (min) 40 min    Equipment Utilized During Treatment Gait belt    Activity Tolerance Patient tolerated treatment well    Behavior During Therapy WFL for tasks assessed/performed                Past Medical History:  Diagnosis Date   Atrial fibrillation (HCC)    CHF (congestive heart failure) (HCC)    Chronic pain    Coronary artery disease    Diabetes mellitus without complication (HCC)    GERD (gastroesophageal reflux disease)    High cholesterol    Hypertension    Hypothyroidism    Presence of permanent cardiac pacemaker    Past Surgical History:  Procedure Laterality Date   BACK SURGERY     CARDIAC CATHETERIZATION Left 10/23/2015   Procedure: Left Heart Cath and Coronary Angiography;  Surgeon: Antonette Batters, MD;  Location: [facility] INVASIVE CV LAB;  Service: Cardiovascular;  Laterality: Left;   COLONOSCOPY N/A 04/29/2022   Procedure: COLONOSCOPY;  Surgeon: Quintin Buckle, DO;  Location: [facility] ENDOSCOPY;  Service: Gastroenterology;  Laterality: N/A;   COLONOSCOPY WITH PROPOFOL  N/A 01/03/2017   Procedure: COLONOSCOPY WITH PROPOFOL ;  Surgeon: Cassie Click, MD;  Location: [facility] ENDOSCOPY;  Service: Endoscopy;  Laterality: N/A;   COLONOSCOPY WITH PROPOFOL  N/A 04/12/2022   Procedure: COLONOSCOPY WITH PROPOFOL ;  Surgeon: Quintin Buckle, DO;  Location: [facility] ENDOSCOPY;  Service:  Gastroenterology;  Laterality: N/A;   CORONARY STENT PLACEMENT     x 4 at [facility]. x 1 by [place] in 2004   INSERT / REPLACE / REMOVE PACEMAKER     LEFT HEART CATH AND CORONARY ANGIOGRAPHY N/A 06/08/2017   Procedure: LEFT HEART CATH AND CORONARY ANGIOGRAPHY;  Surgeon: Antonette Batters, MD;  Location: [facility] INVASIVE CV LAB;  Service: Cardiovascular;  Laterality: N/A;   PACEMAKER IMPLANT N/A 11/03/2021   Procedure: PACEMAKER IMPLANT;  Surgeon: Percival Brace, MD;  Location: [facility] INVASIVE CV LAB;  Service: Cardiovascular;  Laterality: N/A;   Patient Active Problem List   Diagnosis Date Noted   Thrombocytosis 09/08/2023   Sick sinus syndrome (HCC) 11/03/2021   Weakness generalized 05/09/2019   Chronic diastolic heart failure (HCC) 03/23/2018   HTN (hypertension) 03/23/2018   DM (diabetes mellitus) (HCC) 03/23/2018   SOB (shortness of breath) 02/01/2018   Dizziness 08/01/2017   TIA (transient ischemic attack) 10/17/2015   Hyponatremia 10/17/2015   Anemia 10/17/2015   Chest pain 10/16/2015   Left-sided weakness 10/16/2015   Hypotension 10/16/2015   Bradycardia 10/16/2015   Hypoglycemia 10/16/2015    PCP: Dr. Antonio Baumgarten   REFERRING PROVIDER: Dr. Antonio Baumgarten   REFERRING DIAG:  M25.569 (ICD-10-CM) - Pain in joint, lower leg  M25.561 (ICD-10-CM) - Pain in right knee  M25.562 (ICD-10-CM) - Pain in left knee  M79.609 (ICD-10-CM) - Pain in limb  M79.604 (ICD-10-CM) - Pain in right leg  M79.605 (ICD-10-CM) - Pain in left leg    THERAPY DIAG:  Difficulty in walking, not elsewhere classified  Imbalance  Rationale for Evaluation and Treatment: Rehabilitation  ONSET DATE: 04/2019  SUBJECTIVE:   SUBJECTIVE STATEMENT: Pt reports increased right sided sciatica from performing hip external rotation.   PERTINENT HISTORY: Pt reports ongoing knee pain that has been making it difficult for her to move and she has been steadily becoming less mobile. She used to go to  water aerobics and that is where she got most of her exercise, but she is afraid of falling and this keeps her from going to the class at the [facility]. Pt is a diabetic and she has diabetic neuropathy in her feet. She now has to use a single point cane to avoid from falling.  PAIN:  Are you having pain? Yes: NPRS scale: 2-3/10 Pain location: Anterior surface of left knee  Pain description: Achy  Aggravating factors: Walking and standing or basically any weight bearing, stairs  Relieving factors: Vicks helps her go to sleep.   PRECAUTIONS: None  RED FLAGS: None   WEIGHT BEARING RESTRICTIONS: No  FALLS:  Has patient fallen in last 6 months? No  LIVING ENVIRONMENT: Lives with: lives with their spouse Lives in: House/apartment Stairs: Yes: External: 5 steps; on right going up Has following equipment at home: Single point cane and Walker - 2 wheeled  OCCUPATION: Retired   PLOF: Independent  PATIENT GOALS: To be able to walk without an assistive device and to get around her home and upstairs without help   NEXT MD VISIT: January 2025   OBJECTIVE:  Note: Objective measures were completed at Evaluation unless otherwise noted.  VITALS: 197/78 in sitting                 195/78 in sitting   DIAGNOSTIC FINDINGS:   AP and lateral x-rays of the lumbar spine were ordered and personally  reviewed today. These show extensive degenerative disc disease with  extensive osteophyte formation, mild SI arthritis on the AP view, no  scoliosis, pedicles intact. Lateral view shows severe degenerative changes  to the L4-5 disc space with extensive facet arthritis. No  spondylolisthesis, no fracture.  X-ray impression: Extensive degenerative disc and facet arthritis, lumbar  spine.   PATIENT SURVEYS:  FOTO 40 with target of    COGNITION: Overall cognitive status: Within functional limits for tasks assessed     SENSATION: Not tested  MUSCLE LENGTH:  Not performed   POSTURE: rounded  shoulders  PALPATION: None performed    LOWER EXTREMITY ROM:  WFL   LOWER EXTREMITY MMT:   (Blank rows = not tested)  LOWER EXTREMITY SPECIAL TESTS: None performed   FUNCTIONAL TESTS:  5 times sit to stand: 0 reps 30 seconds chair stand test: 0 reps  Timed up and go (TUG): Not tested  2 minute walk test: 170 ft with use of single point cane  10 meter walk test: NT  Dynamic Gait Index: NT  MCTSIB: Condition 1: Avg of 3 trials: NT sec, Condition 2: Avg of 3 trials: NT sec, Condition 3: Avg of 3 trials: NT sec, Condition 4: Avg of 3 trials: NT sec, and Total Score: NT   GAIT: Distance walked: 50 ft  Assistive device utilized: Single point cane Level of assistance: Modified independence Comments: Right sided antalgic gait  TREATMENT DATE:   10/13/23:  THEREX  Nu-Step with seat and arms at 9 for 5 min  Supine Figure Stretch 2 x 60 sec  -Pt reports increased knee pain  IT band stretch and use same position to stretch opposite hip external rotators Sit to Stand at 18" with 1 UE support mat height 1 x 8  -Pt reports increased knee pain  Sit to Stand at 20" with no UE support 2 x 10  -mod VC not to use UE support and to control sitting  NMR  10 M two obstacle step over at height of ankle weight x 10  -min VC for patient to use SPC in LUE, because she has improved step over    10/11/23: Physical Performance   Dynamic Gait Index- Did not use single point cane   -Item 1: Gait level surface- mild impairment  2 -Item 2: Change in gait speed- mild impairment  2 -Item 3: Gait with horizontal head turns - mild impairment  2 -Item 4: Gait with vertical vertical turns- Normal  3 Item 5: Gait with pivot turn-mild impairment 2 -Item 6: Step Over Obstacle- severe impairment  0 -Item 7: Step Around Obstacles- Normal  3  -Item 8: Steps- Moderate Impairment-   1 Total: 16/24 >19/24 indicates increased risk for falls   THEREX   Seated HS Stretch 6 x 60 sec   Seated Hip ER stretch 4 x 60 sec with use of towels to position leg   -Pt reports increased right knee pain  Supine Hip ER stretch 2 x 60 sec  -Pt reports no increased knee pain  Sit to Stand from  20.5" mat surface with 1 UE support 1 x 10  Sit to Stand from 20.5" mat surface with 1 UE support 1 x 10   10/06/23:  THEREX:  Nu-Step with seat and arms at 7 for 5 MIN  Long Arch Quad with #3 AW 2 x 10  -Pt reports increased left knee pain  Long Arch Quad with yellow band 2 x 10  -Pt reports increased left knee pain  Mini-Squat with BUE support to above 90 degree knee and hip flexion 2 x 10    PHYSICAL PERFORMANCE   5 x STS: 6 reps use of hands   30 sec chair stands: 6 reps use of hands   TUG:  Trial 1: 14.48 sec with use of SPC   Trial 2: 12.16 sec with use of SPC   Average: 13.32     09/27/23: THEREX  Nu-Step for 6 min with seat and arms at 5 for 2 min  Heels to toes with BUE support 2 x 10   Heel to toe raises 1 UE support 2 x 10  Stair Negotiation 6 steps ascending and descending stairs with 1 UE support  and step to pattern  -Pt able to perform with minimal verbal cues and with correct LE ascending and descending stairs.   GAIT TRAINING  Step through pattern with single point cane on RUE 10 meters x 20  -mod VC to keep the cane before stepping with LLE    NMR 10 meter step over 2x- #3 Ankle weight with use of single point cane  x  10   -mod VC to maintain single point cane in RUE   Good Mornings 2 x 10  Good Monrings with #8 DB 2 x 10   -Loss of balance with anterior lean       PATIENT EDUCATION:  Education  details: Educated pt on use of pain relief drugs to avoid limitations of pain and that she should speak to her physician if she is concerned about what pain medications to take.   Person educated: Patient and Spouse Education method: Explanation,  Demonstration, Verbal cues, and Handouts Education comprehension: verbalized understanding, returned demonstration, and verbal cues required  HOME EXERCISE PROGRAM: Access Code: [email or web address] URL: [email or web address] Date: 10/11/2023 Prepared by: Marge Shed  Exercises - Seated Plantar Fascia Mobilization with Small Ball  - 1 x daily - 7 x weekly - 10 reps - Seated Hamstring Stretch  - 1 x daily - 7 x weekly - 3 reps - 60 sec  hold - Supine Hip External Rotation Stretch  - 1 x daily - 7 x weekly - 3 reps - 60 sec hold - Sit to Stand with Armchair  - 3-4 x weekly - 3 sets - 10 reps  ASSESSMENT:  CLINICAL IMPRESSION: Pt continues to progress towards goals with ability to perform sit to stands with no UE support and improved obstacle step overs. She continues to be limited by pain from knee OA. She was able to tolerate sit to stands from an increased sit to stand mat height with less increase in knee pain. Pt does continue to exhibit decreased LE strength as evidenced by decreased eccentric control with sitting portion of sit to stand. PT recommended that pt use single point cane in her left hand given improved RLE step over as opposed to when she has single point cane in left hand. She will continue to benefit from skilled PT to improve LE strength and aerobic endurance to improve mobility and decrease risk of falling while performing activities of daily living like grocery shopping or negotiating stairs.  OBJECTIVE IMPAIRMENTS: Abnormal gait, decreased balance, decreased endurance, difficulty walking, decreased strength, and pain.   ACTIVITY LIMITATIONS: carrying, lifting, bending, standing, squatting, stairs, transfers, and locomotion level  PARTICIPATION LIMITATIONS: meal prep, cleaning, laundry, driving, shopping, community activity, and church  PERSONAL FACTORS: Age, Past/current experiences, Time since onset of injury/illness/exacerbation, and 3+ comorbidities: HTN, T2DM,  Afib are also affecting patient's functional outcome.   REHAB POTENTIAL: Good  CLINICAL DECISION MAKING: Stable/uncomplicated  EVALUATION COMPLEXITY: Low   GOALS: Goals reviewed with patient? No  SHORT TERM GOALS: Target date: 05/23/2023  PT reviewed the following HEP with patient with patient able to demonstrate a set of the following with min cuing for correction needed. PT educated patient on parameters of therex (how/when to inc/decrease intensity, frequency, rep/set range, stretch hold time, and purpose of therex) with verbalized understanding.  Baseline:NT  05/24/23: Performing independently   Goal status: ACHIEVED   2.  Patient will be able to perform a sit to stand without use of upper extremity support from 18 inch seated surface as evidence of improved LE strength.  Baseline: Needs to use hands 06/28/23: Needs to use hands still or push against knees 07/20/23: Needs to use one hand to push up 08/11/23: 3 reps  Goal status: ACHIEVED   3. Patient will demonstrate an improvement in DGI score of >=2 pts as evidence of improved dynamic balance to decrease risk of falling (Pardasaney et al, 2012).            Baseline: 11/24 06/30/23: 15/24 07/20/23: 14/24 Goal status: ACHIEVED     LONG TERM GOALS: Target date: 10/18/2023  Patient will have improved function and activity level as evidenced by an increase in FOTO score by 8 points or more.  Baseline: 40  with target of 48 06/28/23: 39 with target of 48  Goal status: DEFERRED    2.  Patient will perform five sit to stand repetitions in <=15 secs as evidence of LE strength that shows that this community dwelling older adult that is older is at a decreased risk of falling. (Buatois 2010)   Baseline: 0 reps; needs to use hands 06/28/23: 0 reps; needs to use hands 07/20/23: 0 reps 08/09/23: 3 reps 10/06/23: 6 reps; uses hands   Goal status: ONGOING   3.  Patient will perform >=11 reps for 30 sec chair stands as evidence of improved LE endurance that  decreases her risk of falling.  Baseline: 0 reps 06/28/23: 0 reps; needs to use hands 08/11/23: 3 reps 10/06/23: 6 reps; uses hands  Goal status: ONGOING   4.  Patient will improve distance by >=40 ft for improved aerobic endurance and increased mobility to resume yard work outside [employer], 2009).  Baseline: 175 ft 06/28/23: 229 ft 07/20/23: 250 ft  Goal status: ACHIEVED   5.  Patient will be able to negotiate >=5 stairs without modified independence as evidence of improved mobility and to decrease caregiver burden.  Baseline: Pt is safest using SPC to negotiate stairs  07/20/23: Able to negotiate 5 stairs with step to gait pattern with RLE going upwards and LLE going downwards and use of SPC  Goal status: Deferred   6. Patient will perform TUG in <13.5 sec as evidence of improved mobility and decreased risk for falling [facility] et al., 2000) Baseline: 20 sec 06/30/23: 17.57 sec 07/20/23: 22 sec 08/11/23: 16 sec 10/06/23: 13.32 sec   Goal status: ACHIEVED            7. Patient will demonstrate reduced falls risk as evidenced by Dynamic Gait Index (DGI) >19/24.  Baseline: 11/24 07/20/23: 14/24 08/11/23: 18/24 10/11/23: 16/24  Goal status: Deferred    PLAN:  PT FREQUENCY: 1-2x/week  PT DURATION: 10 weeks  PLANNED INTERVENTIONS: 97164- PT Re-evaluation, 97110-Therapeutic exercises, 97530- Therapeutic activity, 97112- Neuromuscular re-education, 97535- Self Care, 78295- Manual therapy, 97116- Gait training, [phone]- Orthotic Fit/training, 97014- Electrical stimulation (unattended), [phone]- Electrical stimulation (manual), Patient/Family education, Balance training, Stair training, Dry Needling, Joint mobilization, Joint manipulation, Spinal manipulation, Spinal mobilization, Vestibular training, DME instructions, Cryotherapy, and Moist heat  PLAN FOR NEXT SESSION:  Continue with dynamic balance exercises: work on walking with SPC in LUE especially with obstacle step overs. Sit to stand  progression with added resistance. Standing Hip Extension. Nearing end of POC due to time constraint of insurance authorization.    Marge Shed PT, DPT  [facility] [address] [place], [address] Phone: [phone]   Fax:  [phone]

## 2023-10-18 ENCOUNTER — Ambulatory Visit: Attending: Internal Medicine

## 2023-10-18 DIAGNOSIS — R2689 Other abnormalities of gait and mobility: Secondary | ICD-10-CM | POA: Insufficient documentation

## 2023-10-18 DIAGNOSIS — R262 Difficulty in walking, not elsewhere classified: Secondary | ICD-10-CM | POA: Insufficient documentation

## 2023-10-18 DIAGNOSIS — M6281 Muscle weakness (generalized): Secondary | ICD-10-CM | POA: Insufficient documentation

## 2023-10-18 NOTE — Therapy (Signed)
 OUTPATIENT PHYSICAL THERAPY TREATMENT   Patient Name: Christine Zimmerman MRN: [id number] DOB:1946-03-30, 78 y.o., female Today's Date: 10/18/2023  END OF SESSION:  PT End of Session - 10/18/23 1435     Visit Number 14    Number of Visits 16    Date for PT Re-Evaluation 10/18/23    Authorization Type [employer] 2025    Authorization Time Period auth 16 visits  06/28/23-10/21/23 auth# [id number]    Authorization - Number of Visits 14    Progress Note Due on Visit 16    PT Start Time 1430    PT Stop Time 1510    PT Time Calculation (min) 40 min    Activity Tolerance Patient tolerated treatment well;No increased pain    Behavior During Therapy WFL for tasks assessed/performed             Past Medical History:  Diagnosis Date   Atrial fibrillation (HCC)    CHF (congestive heart failure) (HCC)    Chronic pain    Coronary artery disease    Diabetes mellitus without complication (HCC)    GERD (gastroesophageal reflux disease)    High cholesterol    Hypertension    Hypothyroidism    Presence of permanent cardiac pacemaker    Past Surgical History:  Procedure Laterality Date   BACK SURGERY     CARDIAC CATHETERIZATION Left 10/23/2015   Procedure: Left Heart Cath and Coronary Angiography;  Surgeon: Antonette Batters, MD;  Location: [facility] INVASIVE CV LAB;  Service: Cardiovascular;  Laterality: Left;   COLONOSCOPY N/A 04/29/2022   Procedure: COLONOSCOPY;  Surgeon: Quintin Buckle, DO;  Location: [facility] ENDOSCOPY;  Service: Gastroenterology;  Laterality: N/A;   COLONOSCOPY WITH PROPOFOL  N/A 01/03/2017   Procedure: COLONOSCOPY WITH PROPOFOL ;  Surgeon: Cassie Click, MD;  Location: [facility] ENDOSCOPY;  Service: Endoscopy;  Laterality: N/A;   COLONOSCOPY WITH PROPOFOL  N/A 04/12/2022   Procedure: COLONOSCOPY WITH PROPOFOL ;  Surgeon: Quintin Buckle, DO;  Location: [facility] ENDOSCOPY;  Service: Gastroenterology;  Laterality: N/A;   CORONARY STENT PLACEMENT     x 4 at [facility]. x 1 by  [place] in 2004   INSERT / REPLACE / REMOVE PACEMAKER     LEFT HEART CATH AND CORONARY ANGIOGRAPHY N/A 06/08/2017   Procedure: LEFT HEART CATH AND CORONARY ANGIOGRAPHY;  Surgeon: Antonette Batters, MD;  Location: [facility] INVASIVE CV LAB;  Service: Cardiovascular;  Laterality: N/A;   PACEMAKER IMPLANT N/A 11/03/2021   Procedure: PACEMAKER IMPLANT;  Surgeon: Percival Brace, MD;  Location: [facility] INVASIVE CV LAB;  Service: Cardiovascular;  Laterality: N/A;   Patient Active Problem List   Diagnosis Date Noted   Thrombocytosis 09/08/2023   Sick sinus syndrome (HCC) 11/03/2021   Weakness generalized 05/09/2019   Chronic diastolic heart failure (HCC) 03/23/2018   HTN (hypertension) 03/23/2018   DM (diabetes mellitus) (HCC) 03/23/2018   SOB (shortness of breath) 02/01/2018   Dizziness 08/01/2017   TIA (transient ischemic attack) 10/17/2015   Hyponatremia 10/17/2015   Anemia 10/17/2015   Chest pain 10/16/2015   Left-sided weakness 10/16/2015   Hypotension 10/16/2015   Bradycardia 10/16/2015   Hypoglycemia 10/16/2015    PCP: Dr. Antonio Baumgarten  REFERRING PROVIDER: Dr. Antonio Baumgarten  REFERRING DIAG:  M25.569 (ICD-10-CM) - Pain in joint, lower leg  M25.561 (ICD-10-CM) - Pain in right knee  M25.562 (ICD-10-CM) - Pain in left knee  M79.609 (ICD-10-CM) - Pain in limb  M79.604 (ICD-10-CM) - Pain in right leg  M79.605 (ICD-10-CM) - Pain in left  leg   THERAPY DIAG:  Difficulty in walking, not elsewhere classified  Imbalance  Muscle weakness (generalized)  Rationale for Evaluation and Treatment: Rehabilitation ONSET DATE: 04/2019  SUBJECTIVE:   SUBJECTIVE STATEMENT: No pertinent update. Pt is curous about new charges and payment rates, feels like her visit charges ar emore expensive than they used to be.   PERTINENT HISTORY: Pt reports ongoing knee pain that has been making it difficult for her to move and she has been steadily becoming less mobile. She used to go to water  aerobics and that is where she got most of her exercise, but she is afraid of falling and this keeps her from going to the class at the [facility]. Pt is a diabetic and she has diabetic neuropathy in her feet. She now has to use a single point cane to avoid from falling.  PAIN:  Are you having pain?  No significant pain today   PRECAUTIONS: None WEIGHT BEARING RESTRICTIONS: No FALLS:  Has patient fallen in last 6 months? No  LIVING ENVIRONMENT: Lives with: lives with their spouse Lives in: House/apartment Stairs: Yes: External: 5 steps; on right going up Has following equipment at home: Single point cane and Walker - 2 wheeled  OCCUPATION: Retired   PLOF: Independent  PATIENT GOALS: To be able to walk without an assistive device and to get around her home and upstairs without help   NEXT MD VISIT: January 2025   OBJECTIVE:  Note: Objective measures were completed at Evaluation unless otherwise noted.  DIAGNOSTIC FINDINGS:   "AP and lateral x-rays of the lumbar spine were ordered and personally  reviewed today. These show extensive degenerative disc disease with  extensive osteophyte formation, mild SI arthritis on the AP view, no  scoliosis, pedicles intact. Lateral view shows severe degenerative changes  to the L4-5 disc space with extensive facet arthritis. No  spondylolisthesis, no fracture.  X-ray impression: Extensive degenerative disc and facet arthritis, lumbar  spine."   PATIENT SURVEYS:  FOTO 40    POSTURE: rounded shoulders  FUNCTIONAL TESTS:  5 times sit to stand: 0 reps 30 seconds chair stand test: 0 reps  2 minute walk test: 170 ft with use of single point cane  GAIT: Distance walked: 50 ft  Assistive device utilized: Single point cane Level of assistance: Modified independence Comments: Right sided antalgic gait                                                                                                                                TREATMENT DATE  10/18/23    -AA/ROM seat 7, arms 9, level 2 x  5 minutes  -STS from chair + airex pad 10 hands free or push off knees  -seated marching x20, alternating sides  -normal stance on airex pad: 60sec quiet stance, 60sec alternating head turns, 60 sec chest press with SPC   -STS from chair + airex pad 10 hands free or  push off knees  -seated marching x20, alternating sides  -normal stance on airex pad:  -LAQ @ 2lb AW x12 bilat   -AMB balance course with 2lb AW bilat 4 cnes, 1 half roll, long tumble pad (4x total) c SPC, minguard A     PATIENT EDUCATION:  Education details: Educated pt on use of pain relief drugs to avoid limitations of pain and that she should speak to her physician if she is concerned about what pain medications to take.   Person educated: Patient and Spouse Education method: Explanation, Demonstration, Verbal cues, and Handouts Education comprehension: verbalized understanding, returned demonstration, and verbal cues required  HOME EXERCISE PROGRAM: Access Code: [email or web address] URL: [email or web address] Date: 10/11/2023 Prepared by: Marge Shed  Exercises - Seated Plantar Fascia Mobilization with Small Ball  - 1 x daily - 7 x weekly - 10 reps - Seated Hamstring Stretch  - 1 x daily - 7 x weekly - 3 reps - 60 sec  hold - Supine Hip External Rotation Stretch  - 1 x daily - 7 x weekly - 3 reps - 60 sec hold - Sit to Stand with Armchair  - 3-4 x weekly - 3 sets - 10 reps  ASSESSMENT:  CLINICAL IMPRESSION: Pt continues to progress towards goals with ability to perform sit to stands with no UE support and improved obstacle step overs. Pt less limited by pain from knee OA in today's session. Pt has 1 more visit scheduled in this cert period, then will need to determine if she will conitnue. She will continue to benefit from skilled PT to improve LE strength and aerobic endurance to improve mobility and decrease risk of falling while performing activities of  daily living like grocery shopping or negotiating stairs.  OBJECTIVE IMPAIRMENTS: Abnormal gait, decreased balance, decreased endurance, difficulty walking, decreased strength, and pain.   ACTIVITY LIMITATIONS: carrying, lifting, bending, standing, squatting, stairs, transfers, and locomotion level  PARTICIPATION LIMITATIONS: meal prep, cleaning, laundry, driving, shopping, community activity, and church  PERSONAL FACTORS: Age, Past/current experiences, Time since onset of injury/illness/exacerbation, and 3+ comorbidities: HTN, T2DM, Afib are also affecting patient's functional outcome.   REHAB POTENTIAL: Good  CLINICAL DECISION MAKING: Stable/uncomplicated  EVALUATION COMPLEXITY: Low  GOALS: Goals reviewed with patient? No  SHORT TERM GOALS: Target date: 05/23/2023  PT reviewed the following HEP with patient with patient able to demonstrate a set of the following with min cuing for correction needed. PT educated patient on parameters of therex (how/when to inc/decrease intensity, frequency, rep/set range, stretch hold time, and purpose of therex) with verbalized understanding.  Baseline:NT  05/24/23: Performing independently   Goal status: ACHIEVED   2.  Patient will be able to perform a sit to stand without use of upper extremity support from 18 inch seated surface as evidence of improved LE strength.  Baseline: Needs to use hands 06/28/23: Needs to use hands still or push against knees 07/20/23: Needs to use one hand to push up 08/11/23: 3 reps  Goal status: ACHIEVED   3. Patient will demonstrate an improvement in DGI score of >=2 pts as evidence of improved dynamic balance to decrease risk of falling (Pardasaney et al, 2012).            Baseline: 11/24 06/30/23: 15/24 07/20/23: 14/24 Goal status: ACHIEVED    LONG TERM GOALS: Target date: 10/18/2023  Patient will have improved function and activity level as evidenced by an increase in FOTO score by 8 points or more.  Baseline: 40 with  target of 48 06/28/23: 39 with target of 48  Goal status: NOT MET    2.  Patient will perform five sit to stand repetitions in <=15 secs as evidence of LE strength that shows that this community dwelling older adult that is older is at a decreased risk of falling. (Buatois 2010)   Baseline: 0 reps; needs to use hands 06/28/23: 0 reps; needs to use hands 07/20/23: 0 reps 08/09/23: 3 reps 10/06/23: 6 reps; uses hands   Goal status: ONGOING   3.  Patient will perform >=11 reps for 30 sec chair stands as evidence of improved LE endurance that decreases her risk of falling.  Baseline: 0 reps 06/28/23: 0 reps; needs to use hands 08/11/23: 3 reps 10/06/23: 6 reps; uses hands  Goal status: ONGOING   4.  Patient will improve distance by >=40 ft for improved aerobic endurance and increased mobility to resume yard work outside [employer], 2009).  Baseline: 175 ft 06/28/23: 229 ft 07/20/23: 250 ft  Goal status: ACHIEVED  5.  Patient will be able to negotiate >=5 stairs without modified independence as evidence of improved mobility and to decrease caregiver burden.  Baseline: Pt is safest using SPC to negotiate stairs  07/20/23: Able to negotiate 5 stairs with step to gait pattern with RLE going upwards and LLE going downwards and use of SPC  Goal status: Deferred   6. Patient will perform TUG in <13.5 sec as evidence of improved mobility and decreased risk for falling [facility] et al., 2000) Baseline: 20 sec 06/30/23: 17.57 sec 07/20/23: 22 sec 08/11/23: 16 sec 10/06/23: 13.32 sec   Goal status: ACHIEVED            7. Patient will demonstrate reduced falls risk as evidenced by Dynamic Gait Index (DGI) >19/24.  Baseline: 11/24 07/20/23: 14/24 08/11/23: 18/24 10/11/23: 16/24  Goal status: NOT MET   PLAN:  PT FREQUENCY: 1-2x/week  PT DURATION: 10 weeks  PLANNED INTERVENTIONS: 97164- PT Re-evaluation, 97110-Therapeutic exercises, 97530- Therapeutic activity, 97112- Neuromuscular re-education, 97535- Self  Care, 19147- Manual therapy, 97116- Gait training, [phone]- Orthotic Fit/training, 97014- Electrical stimulation (unattended), [phone]- Electrical stimulation (manual), Patient/Family education, Balance training, Stair training, Dry Needling, Joint mobilization, Joint manipulation, Spinal manipulation, Spinal mobilization, Vestibular training, DME instructions, Cryotherapy, and Moist heat  PLAN FOR NEXT SESSION:  Continue with dynamic balance exercises: work on walking with SPC in LUE especially with obstacle step overs. Sit to stand progression with added resistance. Standing Hip Extension. Nearing end of POC due to time constraint of insurance authorization.   2:41 PM, 10/18/23 Dawn Eth, PT, DPT Physical Therapist - [place] [phone] (Office)

## 2023-10-20 ENCOUNTER — Ambulatory Visit

## 2023-10-20 DIAGNOSIS — R262 Difficulty in walking, not elsewhere classified: Secondary | ICD-10-CM

## 2023-10-20 DIAGNOSIS — M6281 Muscle weakness (generalized): Secondary | ICD-10-CM

## 2023-10-20 DIAGNOSIS — R2689 Other abnormalities of gait and mobility: Secondary | ICD-10-CM | POA: Diagnosis not present

## 2023-10-20 NOTE — Therapy (Signed)
 OUTPATIENT PHYSICAL THERAPY TREATMENT; RECERTIFICATION   Patient Name: IVONNE FREEBURG MRN: [id number] DOB:1946-03-13, 78 y.o., female Today's Date: 10/20/2023  END OF SESSION:  PT End of Session - 10/20/23 1736     Visit Number 15    Number of Visits 32    Date for PT Re-Evaluation 01/12/24    Authorization Type [employer] 2025 (10/20/23-01/12/24)    Authorization Time Period auth 16 visits  06/28/23-10/21/23 auth# [id number]; new auth pending    Authorization - Visit Number 15    Authorization - Number of Visits 15    Progress Note Due on Visit 20    PT Start Time 1730    PT Stop Time 1810    PT Time Calculation (min) 40 min    Equipment Utilized During Treatment Gait belt    Activity Tolerance Patient tolerated treatment well;No increased pain    Behavior During Therapy WFL for tasks assessed/performed             Past Medical History:  Diagnosis Date   Atrial fibrillation (HCC)    CHF (congestive heart failure) (HCC)    Chronic pain    Coronary artery disease    Diabetes mellitus without complication (HCC)    GERD (gastroesophageal reflux disease)    High cholesterol    Hypertension    Hypothyroidism    Presence of permanent cardiac pacemaker    Past Surgical History:  Procedure Laterality Date   BACK SURGERY     CARDIAC CATHETERIZATION Left 10/23/2015   Procedure: Left Heart Cath and Coronary Angiography;  Surgeon: Antonette Batters, MD;  Location: [facility] INVASIVE CV LAB;  Service: Cardiovascular;  Laterality: Left;   COLONOSCOPY N/A 04/29/2022   Procedure: COLONOSCOPY;  Surgeon: Quintin Buckle, DO;  Location: [facility] ENDOSCOPY;  Service: Gastroenterology;  Laterality: N/A;   COLONOSCOPY WITH PROPOFOL  N/A 01/03/2017   Procedure: COLONOSCOPY WITH PROPOFOL ;  Surgeon: Cassie Click, MD;  Location: [facility] ENDOSCOPY;  Service: Endoscopy;  Laterality: N/A;   COLONOSCOPY WITH PROPOFOL  N/A 04/12/2022   Procedure: COLONOSCOPY WITH PROPOFOL ;  Surgeon: Quintin Buckle,  DO;  Location: [facility] ENDOSCOPY;  Service: Gastroenterology;  Laterality: N/A;   CORONARY STENT PLACEMENT     x 4 at [facility]. x 1 by [place] in 2004   INSERT / REPLACE / REMOVE PACEMAKER     LEFT HEART CATH AND CORONARY ANGIOGRAPHY N/A 06/08/2017   Procedure: LEFT HEART CATH AND CORONARY ANGIOGRAPHY;  Surgeon: Antonette Batters, MD;  Location: [facility] INVASIVE CV LAB;  Service: Cardiovascular;  Laterality: N/A;   PACEMAKER IMPLANT N/A 11/03/2021   Procedure: PACEMAKER IMPLANT;  Surgeon: Percival Brace, MD;  Location: [facility] INVASIVE CV LAB;  Service: Cardiovascular;  Laterality: N/A;   Patient Active Problem List   Diagnosis Date Noted   Thrombocytosis 09/08/2023   Sick sinus syndrome (HCC) 11/03/2021   Weakness generalized 05/09/2019   Chronic diastolic heart failure (HCC) 03/23/2018   HTN (hypertension) 03/23/2018   DM (diabetes mellitus) (HCC) 03/23/2018   SOB (shortness of breath) 02/01/2018   Dizziness 08/01/2017   TIA (transient ischemic attack) 10/17/2015   Hyponatremia 10/17/2015   Anemia 10/17/2015   Chest pain 10/16/2015   Left-sided weakness 10/16/2015   Hypotension 10/16/2015   Bradycardia 10/16/2015   Hypoglycemia 10/16/2015    PCP: Dr. Antonio Baumgarten  REFERRING PROVIDER: Dr. Antonio Baumgarten  REFERRING DIAG:  M25.569 (ICD-10-CM) - Pain in joint, lower leg  M25.561 (ICD-10-CM) - Pain in right knee  M25.562 (ICD-10-CM) - Pain in left knee  M79.609 (ICD-10-CM) - Pain in limb  M79.604 (ICD-10-CM) - Pain in right leg  M79.605 (ICD-10-CM) - Pain in left leg   THERAPY DIAG:  Difficulty in walking, not elsewhere classified  Imbalance  Muscle weakness (generalized)  Rationale for Evaluation and Treatment: Rehabilitation ONSET DATE: 04/2019  SUBJECTIVE:   SUBJECTIVE STATEMENT: Pt sore in knees and lateral upper lower legs; (near fib heads). Pt would like to ask insurance for more therapy visits.   PERTINENT HISTORY: Pt reports ongoing knee pain that has  been making it difficult for her to move and she has been steadily becoming less mobile. She used to go to water aerobics and that is where she got most of her exercise, but she is afraid of falling and this keeps her from going to the class at the [facility]. Pt is a diabetic and she has diabetic neuropathy in her feet. She now has to use a single point cane to avoid from falling.  PAIN:  Are you having pain?  6/10 in bilat knees    PRECAUTIONS: None WEIGHT BEARING RESTRICTIONS: No FALLS:  Has patient fallen in last 6 months? No  LIVING ENVIRONMENT: Lives with: lives with their spouse Lives in: House/apartment Stairs: Yes: External: 5 steps; on right going up Has following equipment at home: Single point cane and Walker - 2 wheeled  OCCUPATION: Retired  PLOF: Independent  PATIENT GOALS: To be able to walk without an assistive device and to get around her home and upstairs without help   NEXT MD VISIT: January 2025   OBJECTIVE:  Note: Objective measures were completed at Evaluation unless otherwise noted.  DIAGNOSTIC FINDINGS:   "AP and lateral x-rays of the lumbar spine were ordered and personally  reviewed today. These show extensive degenerative disc disease with  extensive osteophyte formation, mild SI arthritis on the AP view, no  scoliosis, pedicles intact. Lateral view shows severe degenerative changes  to the L4-5 disc space with extensive facet arthritis. No  spondylolisthesis, no fracture.  X-ray impression: Extensive degenerative disc and facet arthritis, lumbar  spine."   PATIENT SURVEYS:  FOTO 40    POSTURE: rounded shoulders  FUNCTIONAL TESTS:  5 times sit to stand: 0 reps 30 seconds chair stand test: 0 reps  2 minute walk test: 170 ft with use of single point cane  GAIT: Distance walked: 50 ft  Assistive device utilized: Single point cane Level of assistance: Modified independence Comments: Right sided antalgic gait                                                                                                                                 TREATMENT DATE 10/20/23   -AA/ROM seat 7, arms 9, level 2 x  6 minutes  -STS from chair + airex pad 10 hands free or push off knees  -LAQ @ 2lb AW x12 bilat  -seated marching x20, alternating sides 2lb AW  -standing double heel raises  x15, treadmill bar support (2lb AW)  -standing marching c 2lb AW 1x20 (2lb AW)  ^ice applied to Rt knee (only tolerated 60sec on Rt)   -STS from chair + airex pad 10 hands free or push off knees  -LAQ @ 2lb AW x12 bilat  -seated marching x20, alternating sides 2lb AW  -standing double heel raises x15, treadmill bar support (2lb AW)  -standing marching c 2lb AW 1x20 (2lb AW)  ^ice applied to Lt knee (only tolerated 60sec on Rt)   -Walking obstacle course 4x, twice with cane, twice without: multiple step overs, + 2 setp ups, 2 step downs, cushion mat, and a airex pad (minGuard assist) (no ankel weights today)  *pt wants to add in up ramp/down ramp next session, still difficult.   PATIENT EDUCATION:  Education details: Educated pt on use of pain relief drugs to avoid limitations of pain and that she should speak to her physician if she is concerned about what pain medications to take.   Person educated: Patient and Spouse Education method: Explanation, Demonstration, Verbal cues, and Handouts Education comprehension: verbalized understanding, returned demonstration, and verbal cues required  HOME EXERCISE PROGRAM: Access Code: [email or web address] URL: [email or web address] Date: 10/11/2023 Prepared by: Marge Shed  Exercises - Seated Plantar Fascia Mobilization with Small Ball  - 1 x daily - 7 x weekly - 10 reps - Seated Hamstring Stretch  - 1 x daily - 7 x weekly - 3 reps - 60 sec  hold - Supine Hip External Rotation Stretch  - 1 x daily - 7 x weekly - 3 reps - 60 sec hold - Sit to Stand with Armchair  - 3-4 x weekly - 3 sets - 10 reps  ASSESSMENT:  CLINICAL  IMPRESSION: Pt continues to progress towards goals with ability to perform sit to stands with no UE support and improved obstacle step overs however she still requires significant surface elevation. Knee pain more limiting today in general, but latera in session seems less limiting after some exercise. Pt has 1 more visit scheduled in this cert period, then will need to determine if she will conitnue. Pt remains limited in balance in gait, but very much is interested in improving it. She will continue to benefit from skilled PT to improve LE strength and aerobic endurance to improve mobility and decrease risk of falling while performing activities of daily living like grocery shopping or negotiating stairs.  OBJECTIVE IMPAIRMENTS: Abnormal gait, decreased balance, decreased endurance, difficulty walking, decreased strength, and pain.   ACTIVITY LIMITATIONS: carrying, lifting, bending, standing, squatting, stairs, transfers, and locomotion level  PARTICIPATION LIMITATIONS: meal prep, cleaning, laundry, driving, shopping, community activity, and church  PERSONAL FACTORS: Age, Past/current experiences, Time since onset of injury/illness/exacerbation, and 3+ comorbidities: HTN, T2DM, Afib are also affecting patient's functional outcome.   REHAB POTENTIAL: Good  CLINICAL DECISION MAKING: Stable/uncomplicated  EVALUATION COMPLEXITY: Low  GOALS: Goals reviewed with patient? No  SHORT TERM GOALS: Target date: 05/23/2023  PT reviewed the following HEP with patient with patient able to demonstrate a set of the following with min cuing for correction needed. PT educated patient on parameters of therex (how/when to inc/decrease intensity, frequency, rep/set range, stretch hold time, and purpose of therex) with verbalized understanding.  Baseline:NT  05/24/23: Performing independently   Goal status: ACHIEVED   2.  Patient will be able to perform a sit to stand without use of upper extremity support from 18  inch seated surface as evidence of improved LE strength.  Baseline:  Needs to use hands 06/28/23: Needs to use hands still or push against knees 07/20/23: Needs to use one hand to push up 08/11/23: 3 reps  Goal status: ACHIEVED   3. Patient will demonstrate an improvement in DGI score of >=2 pts as evidence of improved dynamic balance to decrease risk of falling (Pardasaney et al, 2012).            Baseline: 11/24 06/30/23: 15/24 07/20/23: 14/24 Goal status: ACHIEVED    LONG TERM GOALS: Target date: 10/18/2023  Patient will have improved function and activity level as evidenced by an increase in FOTO score by 8 points or more.  Baseline: 40 with target of 48 06/28/23: 39 with target of 48  Goal status: NOT MET    2.  Patient will perform five sit to stand repetitions in <=15 secs as evidence of LE strength that shows that this community dwelling older adult that is older is at a decreased risk of falling. (Buatois 2010)   Baseline: 0 reps; needs to use hands 06/28/23: 0 reps; needs to use hands 07/20/23: 0 reps 08/09/23: 3 reps 10/06/23: 6 reps; uses hands   Goal status: ONGOING   3.  Patient will perform >=11 reps for 30 sec chair stands as evidence of improved LE endurance that decreases her risk of falling.  Baseline: 0 reps 06/28/23: 0 reps; needs to use hands 08/11/23: 3 reps 10/06/23: 6 reps; uses hands  Goal status: ONGOING   4.  Patient will improve distance by >=40 ft for improved aerobic endurance and increased mobility to resume yard work outside [employer], 2009).  Baseline: 175 ft 06/28/23: 229 ft 07/20/23: 250 ft  Goal status: ACHIEVED  5.  Patient will be able to negotiate >=5 stairs without modified independence as evidence of improved mobility and to decrease caregiver burden.  Baseline: Pt is safest using SPC to negotiate stairs  07/20/23: Able to negotiate 5 stairs with step to gait pattern with RLE going upwards and LLE going downwards and use of SPC  Goal status: Deferred   6.  Patient will perform TUG in <13.5 sec as evidence of improved mobility and decreased risk for falling [facility] et al., 2000) Baseline: 20 sec 06/30/23: 17.57 sec 07/20/23: 22 sec 08/11/23: 16 sec 10/06/23: 13.32 sec   Goal status: ACHIEVED            7. Patient will demonstrate reduced falls risk as evidenced by Dynamic Gait Index (DGI) >19/24.  Baseline: 11/24 07/20/23: 14/24 08/11/23: 18/24 10/11/23: 16/24  Goal status: NOT MET   PLAN:  PT FREQUENCY: 1-2x/week  PT DURATION: 10 weeks  PLANNED INTERVENTIONS: 97164- PT Re-evaluation, 97110-Therapeutic exercises, 97530- Therapeutic activity, 97112- Neuromuscular re-education, 97535- Self Care, 69629- Manual therapy, 97116- Gait training, [phone]- Orthotic Fit/training, 97014- Electrical stimulation (unattended), [phone]- Electrical stimulation (manual), Patient/Family education, Balance training, Stair training, Dry Needling, Joint mobilization, Joint manipulation, Spinal manipulation, Spinal mobilization, Vestibular training, DME instructions, Cryotherapy, and Moist heat  PLAN FOR NEXT SESSION:  Continue with dynamic balance exercises: work on walking with SPC in LUE especially with obstacle step overs. Sit to stand progression with added resistance. Standing Hip Extension. Nearing end of POC due to time constraint of insurance authorization.   5:47 PM, 10/20/23 Dawn Eth, PT, DPT Physical Therapist - [place] [phone] (Office)

## 2023-10-24 ENCOUNTER — Ambulatory Visit: Admitting: Physical Therapy

## 2023-10-24 IMAGING — DX DG CHEST 1V PORT
1 series · 1 of 1 positions shown · non-contrast
Comparison: Chest x-ray dated November 10, 2020

CLINICAL DATA: Pacemaker placement

EXAM:
PORTABLE CHEST 1 VIEW

[chest ap]
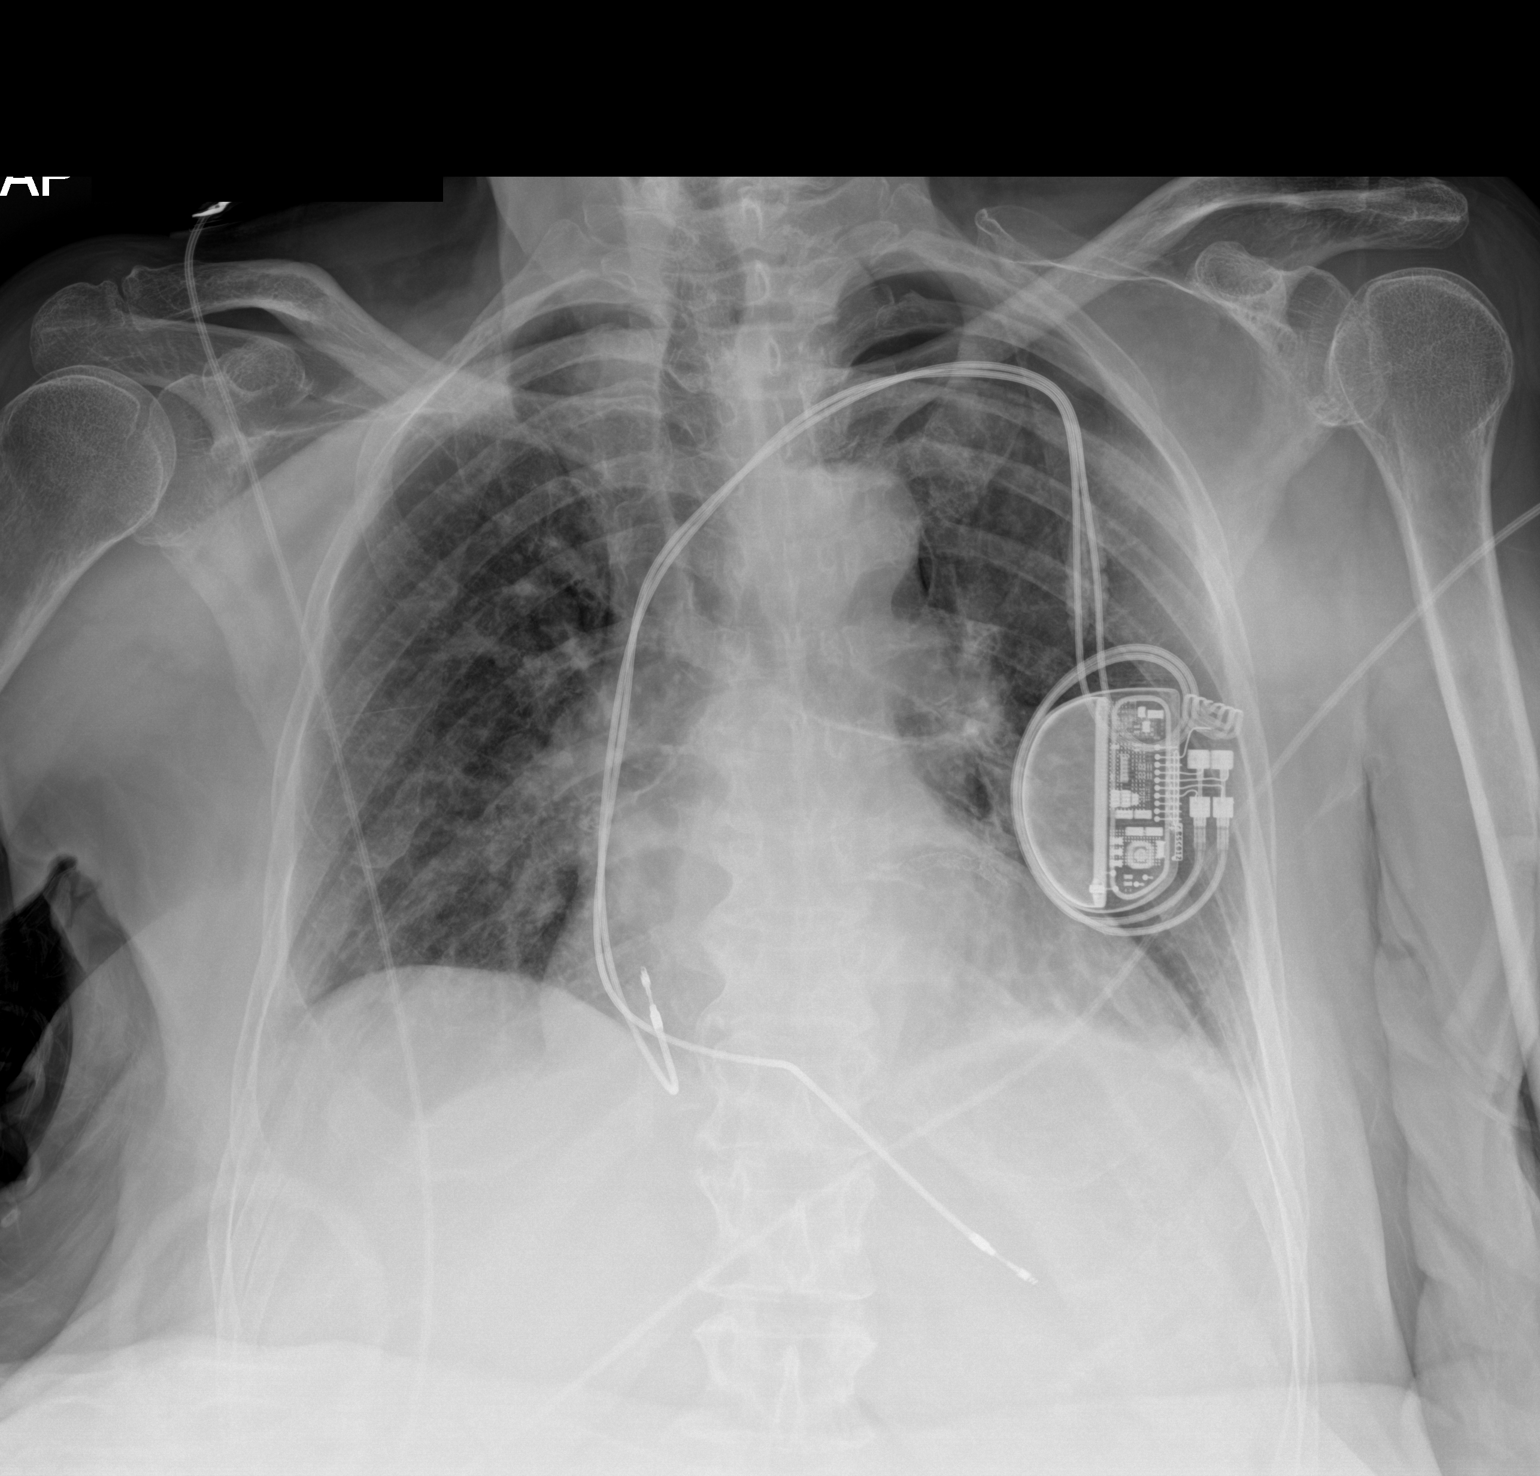

[1 of 1 positions shown; findings below may reference images not displayed]

FINDINGS: Interval placement of left chest wall dual lead pacer with leads
projecting over the expected area of the right atrium and right
ventricle. The heart size and mediastinal contours are within normal
limits. Mild left basilar opacities, likely due to atelectasis. No
large pleural effusion. New small left pneumothorax.
IMPRESSION: 1. Interval placement of left dual lead pacer. New small left
pneumothorax.
2. Mild left basilar opacity, likely due to atelectasis.

Critical Value/emergent results were called by telephone at the time
of interpretation on 11/03/2021 at [DATE] to provider Dr. Zepeda,
who verbally acknowledged these results.

## 2023-10-25 IMAGING — DX DG CHEST 1V PORT
1 series · 1 of 1 positions shown · non-contrast
Comparison: November 03, 2021.

CLINICAL DATA: A 75-year-old female presents for evaluation of
pacemaker placement.

EXAM:
PORTABLE CHEST 1 VIEW

[chest ap]
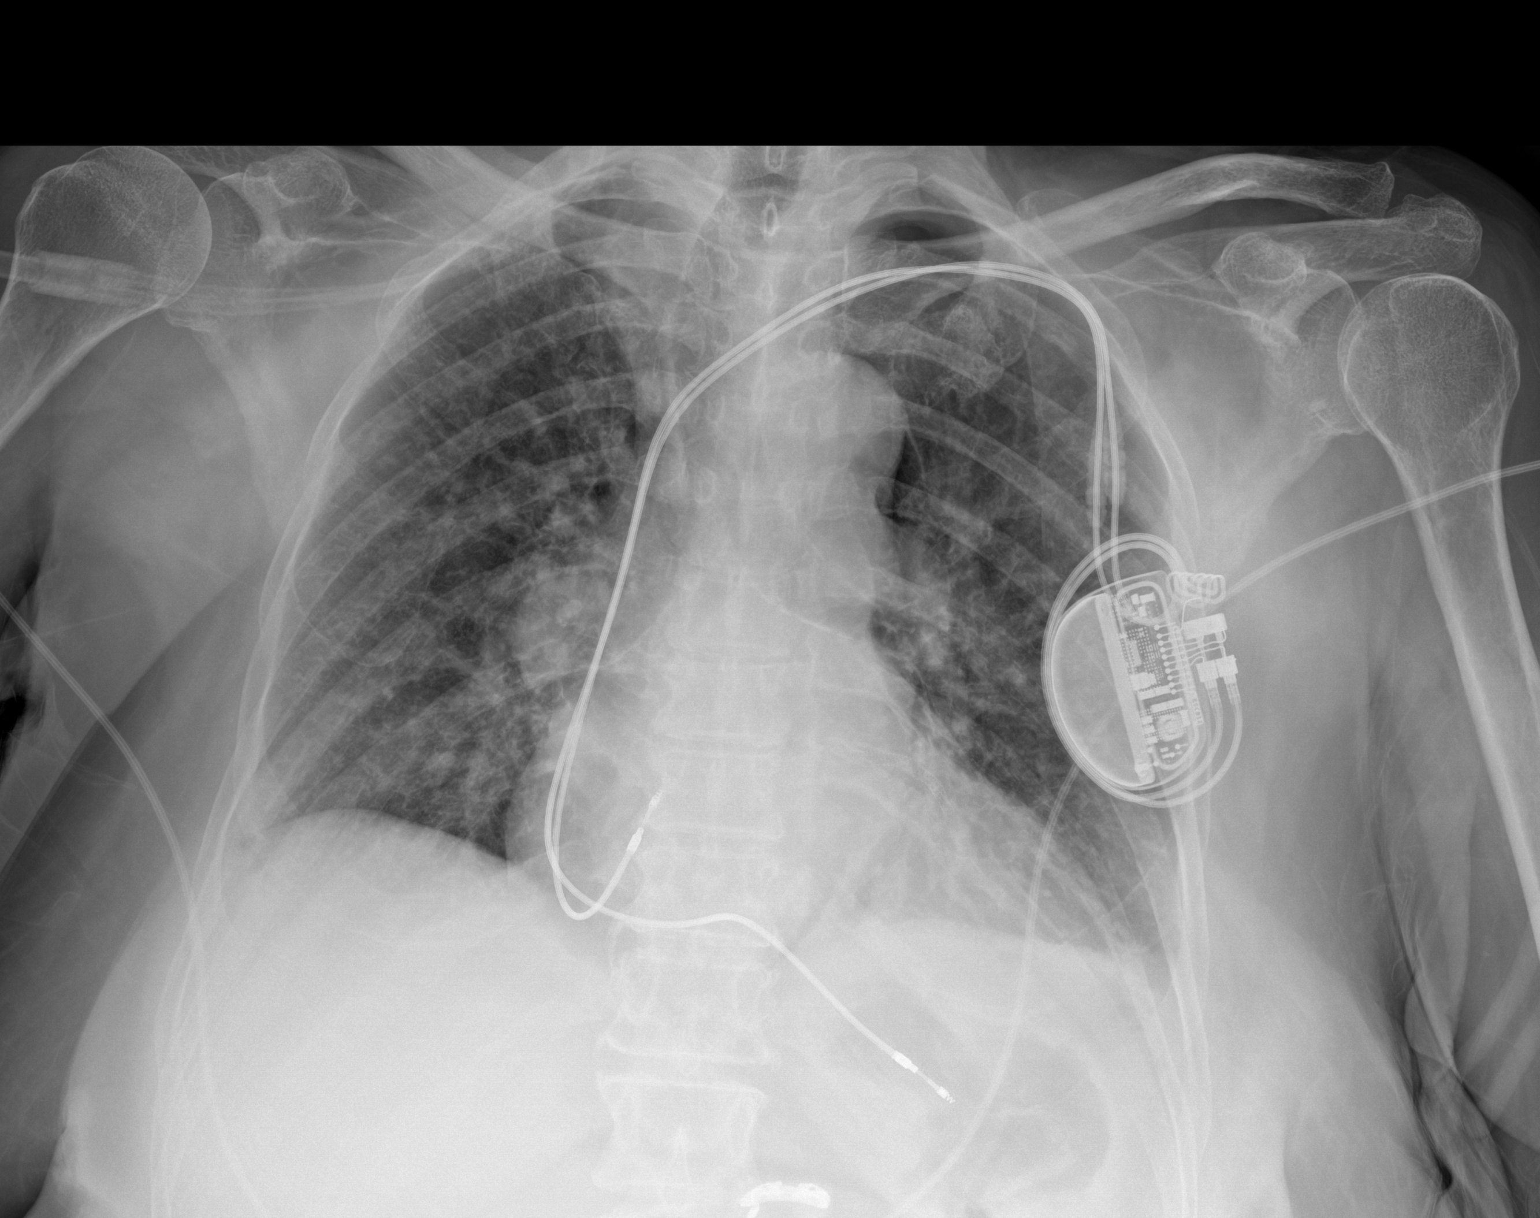

[1 of 1 positions shown; findings below may reference images not displayed]

FINDINGS: Pacemaker remains in place, dual lead type device.

Cardiomediastinal contours and hilar structures are stable.

No lobar consolidation. Small LEFT apical and medial pneumothorax is
similar to previous imaging. Mild increased interstitial markings
persist. The mild LEFT basilar opacity is unchanged.

On limited assessment there is no acute skeletal finding.
IMPRESSION: 1. Small LEFT apical and medial pneumothorax is similar to previous
imaging.
2. Mild increased interstitial markings persist.
3. Mild LEFT basilar opacity is unchanged.

These results will be called to the ordering clinician or
representative by the Radiologist Assistant, and communication
documented in the PACS or [REDACTED].

## 2023-10-26 ENCOUNTER — Ambulatory Visit: Admitting: Physical Therapy

## 2023-10-26 DIAGNOSIS — M6281 Muscle weakness (generalized): Secondary | ICD-10-CM | POA: Diagnosis not present

## 2023-10-26 DIAGNOSIS — R2689 Other abnormalities of gait and mobility: Secondary | ICD-10-CM

## 2023-10-26 DIAGNOSIS — R262 Difficulty in walking, not elsewhere classified: Secondary | ICD-10-CM

## 2023-10-26 NOTE — Therapy (Signed)
 OUTPATIENT PHYSICAL THERAPY TREATMENT   Patient Name: Christine Zimmerman MRN: [id number] DOB:08-15-1945, 78 y.o., female Today's Date: 10/26/2023  END OF SESSION:  PT End of Session - 10/26/23 1523     Visit Number 16    Number of Visits 32    Date for PT Re-Evaluation 01/12/24    Authorization Type [employer] 2025 (10/20/23-01/12/24)    Authorization Time Period auth 16 visits  06/28/23-10/21/23 auth# [id number]; new auth pending -01/12/24    Authorization - Visit Number 16    Authorization - Number of Visits 27    Progress Note Due on Visit 20    PT Start Time 1520    PT Stop Time 1559    PT Time Calculation (min) 39 min    Equipment Utilized During Treatment Gait belt    Activity Tolerance Patient tolerated treatment well;No increased pain    Behavior During Therapy WFL for tasks assessed/performed             Past Medical History:  Diagnosis Date   Atrial fibrillation (HCC)    CHF (congestive heart failure) (HCC)    Chronic pain    Coronary artery disease    Diabetes mellitus without complication (HCC)    GERD (gastroesophageal reflux disease)    High cholesterol    Hypertension    Hypothyroidism    Presence of permanent cardiac pacemaker    Past Surgical History:  Procedure Laterality Date   BACK SURGERY     CARDIAC CATHETERIZATION Left 10/23/2015   Procedure: Left Heart Cath and Coronary Angiography;  Surgeon: Antonette Batters, MD;  Location: [facility] INVASIVE CV LAB;  Service: Cardiovascular;  Laterality: Left;   COLONOSCOPY N/A 04/29/2022   Procedure: COLONOSCOPY;  Surgeon: Quintin Buckle, DO;  Location: [facility] ENDOSCOPY;  Service: Gastroenterology;  Laterality: N/A;   COLONOSCOPY WITH PROPOFOL  N/A 01/03/2017   Procedure: COLONOSCOPY WITH PROPOFOL ;  Surgeon: Cassie Click, MD;  Location: [facility] ENDOSCOPY;  Service: Endoscopy;  Laterality: N/A;   COLONOSCOPY WITH PROPOFOL  N/A 04/12/2022   Procedure: COLONOSCOPY WITH PROPOFOL ;  Surgeon: Quintin Buckle, DO;   Location: [facility] ENDOSCOPY;  Service: Gastroenterology;  Laterality: N/A;   CORONARY STENT PLACEMENT     x 4 at [facility]. x 1 by [place] in 2004   INSERT / REPLACE / REMOVE PACEMAKER     LEFT HEART CATH AND CORONARY ANGIOGRAPHY N/A 06/08/2017   Procedure: LEFT HEART CATH AND CORONARY ANGIOGRAPHY;  Surgeon: Antonette Batters, MD;  Location: [facility] INVASIVE CV LAB;  Service: Cardiovascular;  Laterality: N/A;   PACEMAKER IMPLANT N/A 11/03/2021   Procedure: PACEMAKER IMPLANT;  Surgeon: Percival Brace, MD;  Location: [facility] INVASIVE CV LAB;  Service: Cardiovascular;  Laterality: N/A;   Patient Active Problem List   Diagnosis Date Noted   Thrombocytosis 09/08/2023   Sick sinus syndrome (HCC) 11/03/2021   Weakness generalized 05/09/2019   Chronic diastolic heart failure (HCC) 03/23/2018   HTN (hypertension) 03/23/2018   DM (diabetes mellitus) (HCC) 03/23/2018   SOB (shortness of breath) 02/01/2018   Dizziness 08/01/2017   TIA (transient ischemic attack) 10/17/2015   Hyponatremia 10/17/2015   Anemia 10/17/2015   Chest pain 10/16/2015   Left-sided weakness 10/16/2015   Hypotension 10/16/2015   Bradycardia 10/16/2015   Hypoglycemia 10/16/2015    PCP: Dr. Antonio Baumgarten  REFERRING PROVIDER: Dr. Antonio Baumgarten  REFERRING DIAG:  M25.569 (ICD-10-CM) - Pain in joint, lower leg  M25.561 (ICD-10-CM) - Pain in right knee  M25.562 (ICD-10-CM) - Pain in left knee  M79.609 (ICD-10-CM) - Pain in limb  M79.604 (ICD-10-CM) - Pain in right leg  M79.605 (ICD-10-CM) - Pain in left leg   THERAPY DIAG:  Difficulty in walking, not elsewhere classified  Imbalance  Muscle weakness (generalized)  Rationale for Evaluation and Treatment: Rehabilitation ONSET DATE: 04/2019  SUBJECTIVE:   SUBJECTIVE STATEMENT: Pt doing ok today. Very hot outside so she is trying to stay inside today.   PERTINENT HISTORY: Pt reports ongoing knee pain that has been making it difficult for her to move and she  has been steadily becoming less mobile. She used to go to water aerobics and that is where she got most of her exercise, but she is afraid of falling and this keeps her from going to the class at the [facility]. Pt is a diabetic and she has diabetic neuropathy in her feet. She now has to use a single point cane to avoid from falling.  PAIN:  Are you having pain?  5-6/10 in bilat knees    PRECAUTIONS: None WEIGHT BEARING RESTRICTIONS: No FALLS:  Has patient fallen in last 6 months? No  LIVING ENVIRONMENT: Lives with: lives with their spouse Lives in: House/apartment Stairs: Yes: External: 5 steps; on right going up Has following equipment at home: Single point cane and Walker - 2 wheeled  OCCUPATION: Retired  PLOF: Independent  PATIENT GOALS: To be able to walk without an assistive device and to get around her home and upstairs without help   NEXT MD VISIT: January 2025   OBJECTIVE:  Note: Objective measures were completed at Evaluation unless otherwise noted.  DIAGNOSTIC FINDINGS:   AP and lateral x-rays of the lumbar spine were ordered and personally  reviewed today. These show extensive degenerative disc disease with  extensive osteophyte formation, mild SI arthritis on the AP view, no  scoliosis, pedicles intact. Lateral view shows severe degenerative changes  to the L4-5 disc space with extensive facet arthritis. No  spondylolisthesis, no fracture.  X-ray impression: Extensive degenerative disc and facet arthritis, lumbar  spine.   PATIENT SURVEYS:  FOTO 40    POSTURE: rounded shoulders  FUNCTIONAL TESTS:  5 times sit to stand: 0 reps 30 seconds chair stand test: 0 reps  2 minute walk test: 170 ft with use of single point cane  GAIT: Distance walked: 50 ft  Assistive device utilized: Single point cane Level of assistance: Modified independence Comments: Right sided antalgic gait                                                                                                                                 TREATMENT DATE 10/26/23   -AA/ROM seat 7, arms 9, level 2 x  6 minutes  -STS from chair + small airex pad 10 hands free or push off knees  -standing marching c 2lb AW 1x20 alternating sides 3lb AW (increased weight this date from 2lb)  -standing double heel raises x15, treadmill bar support (  3lb AW)  -lateral step over 1x20 bilat, yellow hurdle, 3lb AW bilat   -STS from chair + small airex pad 10 hands free or push off knees  -standing marching c 2lb AW 1x20 alternating sides 3lb AW (increased weight this date from 2lb)  -standing double heel raises x15, treadmill bar support (3lb AW)  -lateral step over 1x20 bilat, yellow hurdle, 3lb AW bilat   -5xSTS from chair height only  -obstacle balance course with 2x4/airex beam side by side, then faom square, then yellow hurdle, foam pad plank, up 4 stairs, grab object and carry back across all about: twice with sPC, minGuardA 35min40sec.    -Walking obstacle course 4x, twice with cane, twice without: multiple step overs, + 2 setp ups, 2 step downs, cushion mat, and a airex pad (minGuard assist) (no ankel weights today)  *pt wants to add in up ramp/down ramp next session, still difficult.   PATIENT EDUCATION:  Education details: Educated pt on use of pain relief drugs to avoid limitations of pain and that she should speak to her physician if she is concerned about what pain medications to take.   Person educated: Patient and Spouse Education method: Explanation, Demonstration, Verbal cues, and Handouts Education comprehension: verbalized understanding, returned demonstration, and verbal cues required  HOME EXERCISE PROGRAM: Access Code: [email or web address] URL: [email or web address] Date: 10/11/2023 Prepared by: Marge Shed  Exercises - Seated Plantar Fascia Mobilization with Small Ball  - 1 x daily - 7 x weekly - 10 reps - Seated Hamstring Stretch  - 1 x daily - 7 x weekly - 3 reps - 60 sec   hold - Supine Hip External Rotation Stretch  - 1 x daily - 7 x weekly - 3 reps - 60 sec hold - Sit to Stand with Armchair  - 3-4 x weekly - 3 sets - 10 reps  ASSESSMENT:  CLINICAL IMPRESSION: Pt remains limited in balance in gait, but very much is interested in improving it. Incorporated some functional stairs navigation with [facility] and item carry, pt shows excellent safety awareness and problem solving. She will continue to benefit from skilled PT to improve LE strength and aerobic endurance to improve mobility and decrease risk of falling while performing activities of daily living like grocery shopping or negotiating stairs.  OBJECTIVE IMPAIRMENTS: Abnormal gait, decreased balance, decreased endurance, difficulty walking, decreased strength, and pain.   ACTIVITY LIMITATIONS: carrying, lifting, bending, standing, squatting, stairs, transfers, and locomotion level  PARTICIPATION LIMITATIONS: meal prep, cleaning, laundry, driving, shopping, community activity, and church  PERSONAL FACTORS: Age, Past/current experiences, Time since onset of injury/illness/exacerbation, and 3+ comorbidities: HTN, T2DM, Afib are also affecting patient's functional outcome.   REHAB POTENTIAL: Good  CLINICAL DECISION MAKING: Stable/uncomplicated  EVALUATION COMPLEXITY: Low  GOALS: Goals reviewed with patient? No  SHORT TERM GOALS: Target date: 05/23/2023  PT reviewed the following HEP with patient with patient able to demonstrate a set of the following with min cuing for correction needed. PT educated patient on parameters of therex (how/when to inc/decrease intensity, frequency, rep/set range, stretch hold time, and purpose of therex) with verbalized understanding.  Baseline:NT  05/24/23: Performing independently   Goal status: ACHIEVED   2.  Patient will be able to perform a sit to stand without use of upper extremity support from 18 inch seated surface as evidence of improved LE strength.  Baseline: Needs to  use hands 06/28/23: Needs to use hands still or push against knees 07/20/23: Needs to use one hand to push up  08/11/23: 3 reps  Goal status: ACHIEVED   3. Patient will demonstrate an improvement in DGI score of >=2 pts as evidence of improved dynamic balance to decrease risk of falling (Pardasaney et al, 2012).            Baseline: 11/24 06/30/23: 15/24 07/20/23: 14/24 Goal status: ACHIEVED    LONG TERM GOALS: Target date: 10/18/2023  Patient will have improved function and activity level as evidenced by an increase in FOTO score by 8 points or more.  Baseline: 40 with target of 48 06/28/23: 39 with target of 48  Goal status: NOT MET    2.  Patient will perform five sit to stand repetitions in <=15 secs as evidence of LE strength that shows that this community dwelling older adult that is older is at a decreased risk of falling. (Buatois 2010)   Baseline: 0 reps; needs to use hands 06/28/23: 0 reps; needs to use hands 07/20/23: 0 reps 08/09/23: 3 reps 10/06/23: 6 reps; uses hands   Goal status: ONGOING   3.  Patient will perform >=11 reps for 30 sec chair stands as evidence of improved LE endurance that decreases her risk of falling.  Baseline: 0 reps 06/28/23: 0 reps; needs to use hands 08/11/23: 3 reps 10/06/23: 6 reps; uses hands  Goal status: ONGOING   4.  Patient will improve distance by >=40 ft for improved aerobic endurance and increased mobility to resume yard work outside [employer], 2009).  Baseline: 175 ft 06/28/23: 229 ft 07/20/23: 250 ft  Goal status: ACHIEVED  5.  Patient will be able to negotiate >=5 stairs without modified independence as evidence of improved mobility and to decrease caregiver burden.  Baseline: Pt is safest using SPC to negotiate stairs  07/20/23: Able to negotiate 5 stairs with step to gait pattern with RLE going upwards and LLE going downwards and use of SPC  Goal status: Deferred   6. Patient will perform TUG in <13.5 sec as evidence of improved mobility and  decreased risk for falling [facility] et al., 2000) Baseline: 20 sec 06/30/23: 17.57 sec 07/20/23: 22 sec 08/11/23: 16 sec 10/06/23: 13.32 sec   Goal status: ACHIEVED            7. Patient will demonstrate reduced falls risk as evidenced by Dynamic Gait Index (DGI) >19/24.  Baseline: 11/24 07/20/23: 14/24 08/11/23: 18/24 10/11/23: 16/24  Goal status: NOT MET   PLAN:  PT FREQUENCY: 1-2x/week  PT DURATION: 10 weeks  PLANNED INTERVENTIONS: 97164- PT Re-evaluation, 97110-Therapeutic exercises, 97530- Therapeutic activity, 97112- Neuromuscular re-education, 97535- Self Care, 16109- Manual therapy, 97116- Gait training, [phone]- Orthotic Fit/training, 97014- Electrical stimulation (unattended), [phone]- Electrical stimulation (manual), Patient/Family education, Balance training, Stair training, Dry Needling, Joint mobilization, Joint manipulation, Spinal manipulation, Spinal mobilization, Vestibular training, DME instructions, Cryotherapy, and Moist heat  PLAN FOR NEXT SESSION:  Continue with dynamic balance exercises: work on walking with SPC in LUE especially with obstacle step overs. Sit to stand progression with added resistance. Standing Hip Extension. Nearing end of POC due to time constraint of insurance authorization.   3:26 PM, 10/26/23 Dawn Eth, PT, DPT Physical Therapist - [place] [phone] (Office)

## 2023-10-31 ENCOUNTER — Ambulatory Visit: Admitting: Physical Therapy

## 2023-11-02 ENCOUNTER — Telehealth: Payer: Self-pay | Admitting: Physical Therapy

## 2023-11-02 ENCOUNTER — Ambulatory Visit: Admitting: Physical Therapy

## 2023-11-02 NOTE — Telephone Encounter (Signed)
 Called pt to inquire about absence from PT. Pt reports that her husband's health is worsening and that she needs to care for him and she will be unable to commit to coming to physical therapy at this point. PT will take remainder of appointments off schedule for pt and pt is in agreement with this plan.

## 2023-11-07 ENCOUNTER — Ambulatory Visit: Admitting: Physical Therapy

## 2023-11-09 ENCOUNTER — Ambulatory Visit: Admitting: Physical Therapy

## 2023-11-14 ENCOUNTER — Encounter: Admitting: Physical Therapy

## 2023-11-16 DIAGNOSIS — R262 Difficulty in walking, not elsewhere classified: Secondary | ICD-10-CM | POA: Diagnosis not present

## 2023-11-16 DIAGNOSIS — D75839 Thrombocytosis, unspecified: Secondary | ICD-10-CM | POA: Diagnosis not present

## 2023-11-16 DIAGNOSIS — R4589 Other symptoms and signs involving emotional state: Secondary | ICD-10-CM | POA: Diagnosis not present

## 2023-11-16 DIAGNOSIS — M1711 Unilateral primary osteoarthritis, right knee: Secondary | ICD-10-CM | POA: Diagnosis not present

## 2023-11-16 DIAGNOSIS — I110 Hypertensive heart disease with heart failure: Secondary | ICD-10-CM | POA: Diagnosis not present

## 2023-11-16 DIAGNOSIS — I5030 Unspecified diastolic (congestive) heart failure: Secondary | ICD-10-CM | POA: Diagnosis not present

## 2023-11-16 DIAGNOSIS — E039 Hypothyroidism, unspecified: Secondary | ICD-10-CM | POA: Diagnosis not present

## 2023-11-16 DIAGNOSIS — E1140 Type 2 diabetes mellitus with diabetic neuropathy, unspecified: Secondary | ICD-10-CM | POA: Diagnosis not present

## 2023-11-28 DIAGNOSIS — R7989 Other specified abnormal findings of blood chemistry: Secondary | ICD-10-CM | POA: Diagnosis not present

## 2023-11-28 DIAGNOSIS — E1165 Type 2 diabetes mellitus with hyperglycemia: Secondary | ICD-10-CM | POA: Diagnosis not present

## 2023-11-28 DIAGNOSIS — I10 Essential (primary) hypertension: Secondary | ICD-10-CM | POA: Diagnosis not present

## 2023-11-28 DIAGNOSIS — M150 Primary generalized (osteo)arthritis: Secondary | ICD-10-CM | POA: Diagnosis not present

## 2023-11-28 DIAGNOSIS — G5603 Carpal tunnel syndrome, bilateral upper limbs: Secondary | ICD-10-CM | POA: Diagnosis not present

## 2023-11-28 DIAGNOSIS — R262 Difficulty in walking, not elsewhere classified: Secondary | ICD-10-CM | POA: Diagnosis not present

## 2023-12-05 DIAGNOSIS — I5030 Unspecified diastolic (congestive) heart failure: Secondary | ICD-10-CM | POA: Diagnosis not present

## 2023-12-05 DIAGNOSIS — E039 Hypothyroidism, unspecified: Secondary | ICD-10-CM | POA: Diagnosis not present

## 2023-12-05 DIAGNOSIS — D75839 Thrombocytosis, unspecified: Secondary | ICD-10-CM | POA: Diagnosis not present

## 2023-12-05 DIAGNOSIS — E1140 Type 2 diabetes mellitus with diabetic neuropathy, unspecified: Secondary | ICD-10-CM | POA: Diagnosis not present

## 2023-12-05 DIAGNOSIS — R262 Difficulty in walking, not elsewhere classified: Secondary | ICD-10-CM | POA: Diagnosis not present

## 2023-12-05 DIAGNOSIS — I110 Hypertensive heart disease with heart failure: Secondary | ICD-10-CM | POA: Diagnosis not present

## 2023-12-27 DIAGNOSIS — M47812 Spondylosis without myelopathy or radiculopathy, cervical region: Secondary | ICD-10-CM | POA: Diagnosis not present

## 2023-12-27 DIAGNOSIS — M19049 Primary osteoarthritis, unspecified hand: Secondary | ICD-10-CM | POA: Diagnosis not present

## 2023-12-27 DIAGNOSIS — M545 Low back pain, unspecified: Secondary | ICD-10-CM | POA: Diagnosis not present

## 2023-12-27 DIAGNOSIS — G5603 Carpal tunnel syndrome, bilateral upper limbs: Secondary | ICD-10-CM | POA: Diagnosis not present

## 2024-01-30 ENCOUNTER — Telehealth: Payer: Self-pay | Admitting: Oncology

## 2024-01-30 ENCOUNTER — Other Ambulatory Visit

## 2024-01-30 ENCOUNTER — Inpatient Hospital Stay: Admitting: Oncology

## 2024-01-30 NOTE — Telephone Encounter (Signed)
 Pt left vm to cancel appts for today 9/15.  I called pt to let her know they have been canceled and if she wanted to r/s them. Pt stated she will call back to r/s when she can

## 2024-02-01 DIAGNOSIS — I495 Sick sinus syndrome: Secondary | ICD-10-CM | POA: Diagnosis not present

## 2024-02-02 DIAGNOSIS — I10 Essential (primary) hypertension: Secondary | ICD-10-CM | POA: Diagnosis not present

## 2024-02-02 DIAGNOSIS — I495 Sick sinus syndrome: Secondary | ICD-10-CM | POA: Diagnosis not present

## 2024-02-02 DIAGNOSIS — Z950 Presence of cardiac pacemaker: Secondary | ICD-10-CM | POA: Diagnosis not present

## 2024-02-02 DIAGNOSIS — I25118 Atherosclerotic heart disease of native coronary artery with other forms of angina pectoris: Secondary | ICD-10-CM | POA: Diagnosis not present

## 2024-02-02 DIAGNOSIS — E119 Type 2 diabetes mellitus without complications: Secondary | ICD-10-CM | POA: Diagnosis not present

## 2024-02-02 DIAGNOSIS — Z955 Presence of coronary angioplasty implant and graft: Secondary | ICD-10-CM | POA: Diagnosis not present

## 2024-02-02 DIAGNOSIS — I639 Cerebral infarction, unspecified: Secondary | ICD-10-CM | POA: Diagnosis not present

## 2024-02-02 DIAGNOSIS — E782 Mixed hyperlipidemia: Secondary | ICD-10-CM | POA: Diagnosis not present

## 2024-02-02 DIAGNOSIS — R001 Bradycardia, unspecified: Secondary | ICD-10-CM | POA: Diagnosis not present

## 2024-02-09 DIAGNOSIS — E1140 Type 2 diabetes mellitus with diabetic neuropathy, unspecified: Secondary | ICD-10-CM | POA: Diagnosis not present

## 2024-02-09 DIAGNOSIS — E1165 Type 2 diabetes mellitus with hyperglycemia: Secondary | ICD-10-CM | POA: Diagnosis not present

## 2024-02-09 DIAGNOSIS — I10 Essential (primary) hypertension: Secondary | ICD-10-CM | POA: Diagnosis not present

## 2024-03-07 DIAGNOSIS — R600 Localized edema: Secondary | ICD-10-CM | POA: Diagnosis not present

## 2024-03-07 DIAGNOSIS — E871 Hypo-osmolality and hyponatremia: Secondary | ICD-10-CM | POA: Diagnosis not present

## 2024-03-07 DIAGNOSIS — E1165 Type 2 diabetes mellitus with hyperglycemia: Secondary | ICD-10-CM | POA: Diagnosis not present

## 2024-03-07 DIAGNOSIS — E8809 Other disorders of plasma-protein metabolism, not elsewhere classified: Secondary | ICD-10-CM | POA: Diagnosis not present

## 2024-03-07 DIAGNOSIS — E039 Hypothyroidism, unspecified: Secondary | ICD-10-CM | POA: Diagnosis not present

## 2024-03-07 DIAGNOSIS — R262 Difficulty in walking, not elsewhere classified: Secondary | ICD-10-CM | POA: Diagnosis not present

## 2024-03-07 DIAGNOSIS — I10 Essential (primary) hypertension: Secondary | ICD-10-CM | POA: Diagnosis not present

## 2024-03-07 DIAGNOSIS — D75839 Thrombocytosis, unspecified: Secondary | ICD-10-CM | POA: Diagnosis not present

## 2024-03-14 DIAGNOSIS — E1140 Type 2 diabetes mellitus with diabetic neuropathy, unspecified: Secondary | ICD-10-CM | POA: Diagnosis not present

## 2024-03-14 DIAGNOSIS — I10 Essential (primary) hypertension: Secondary | ICD-10-CM | POA: Diagnosis not present

## 2024-03-14 DIAGNOSIS — I2510 Atherosclerotic heart disease of native coronary artery without angina pectoris: Secondary | ICD-10-CM | POA: Diagnosis not present

## 2024-03-14 DIAGNOSIS — Z1331 Encounter for screening for depression: Secondary | ICD-10-CM | POA: Diagnosis not present

## 2024-03-14 DIAGNOSIS — Z0000 Encounter for general adult medical examination without abnormal findings: Secondary | ICD-10-CM | POA: Diagnosis not present

## 2024-03-14 DIAGNOSIS — Z23 Encounter for immunization: Secondary | ICD-10-CM | POA: Diagnosis not present

## 2024-04-05 DIAGNOSIS — G5603 Carpal tunnel syndrome, bilateral upper limbs: Secondary | ICD-10-CM | POA: Diagnosis not present

## 2024-04-05 DIAGNOSIS — M19049 Primary osteoarthritis, unspecified hand: Secondary | ICD-10-CM | POA: Diagnosis not present

## 2024-04-05 DIAGNOSIS — M47812 Spondylosis without myelopathy or radiculopathy, cervical region: Secondary | ICD-10-CM | POA: Diagnosis not present

## 2024-04-05 DIAGNOSIS — G629 Polyneuropathy, unspecified: Secondary | ICD-10-CM | POA: Diagnosis not present

## 2024-04-05 DIAGNOSIS — M545 Low back pain, unspecified: Secondary | ICD-10-CM | POA: Diagnosis not present

## 2024-04-17 DIAGNOSIS — R001 Bradycardia, unspecified: Secondary | ICD-10-CM | POA: Diagnosis not present
